# Patient Record
Sex: Male | Born: 1940
Health system: Southern US, Community
[De-identification: ages and names within clinical notes are randomized; demographics above are authoritative.]

## PROBLEM LIST (undated history)

## (undated) DIAGNOSIS — Z8614 Personal history of Methicillin resistant Staphylococcus aureus infection: Secondary | ICD-10-CM

## (undated) DIAGNOSIS — J45909 Unspecified asthma, uncomplicated: Secondary | ICD-10-CM

## (undated) DIAGNOSIS — J309 Allergic rhinitis, unspecified: Secondary | ICD-10-CM

## (undated) DIAGNOSIS — L0390 Cellulitis, unspecified: Secondary | ICD-10-CM

## (undated) DIAGNOSIS — H409 Unspecified glaucoma: Secondary | ICD-10-CM

## (undated) DIAGNOSIS — K5792 Diverticulitis of intestine, part unspecified, without perforation or abscess without bleeding: Secondary | ICD-10-CM

## (undated) DIAGNOSIS — M1990 Unspecified osteoarthritis, unspecified site: Secondary | ICD-10-CM

## (undated) DIAGNOSIS — I5189 Other ill-defined heart diseases: Secondary | ICD-10-CM

## (undated) DIAGNOSIS — B4481 Allergic bronchopulmonary aspergillosis: Secondary | ICD-10-CM

## (undated) DIAGNOSIS — J449 Chronic obstructive pulmonary disease, unspecified: Secondary | ICD-10-CM

## (undated) DIAGNOSIS — R0600 Dyspnea, unspecified: Secondary | ICD-10-CM

## (undated) DIAGNOSIS — Z8619 Personal history of other infectious and parasitic diseases: Secondary | ICD-10-CM

## (undated) DIAGNOSIS — I479 Paroxysmal tachycardia, unspecified: Secondary | ICD-10-CM

## (undated) DIAGNOSIS — R748 Abnormal levels of other serum enzymes: Secondary | ICD-10-CM

## (undated) DIAGNOSIS — I2510 Atherosclerotic heart disease of native coronary artery without angina pectoris: Secondary | ICD-10-CM

## (undated) DIAGNOSIS — I10 Essential (primary) hypertension: Secondary | ICD-10-CM

## (undated) DIAGNOSIS — T148XXA Other injury of unspecified body region, initial encounter: Secondary | ICD-10-CM

## (undated) DIAGNOSIS — E785 Hyperlipidemia, unspecified: Secondary | ICD-10-CM

## (undated) DIAGNOSIS — S29012A Strain of muscle and tendon of back wall of thorax, initial encounter: Secondary | ICD-10-CM

## (undated) DIAGNOSIS — J189 Pneumonia, unspecified organism: Secondary | ICD-10-CM

## (undated) DIAGNOSIS — I82419 Acute embolism and thrombosis of unspecified femoral vein: Secondary | ICD-10-CM

## (undated) DIAGNOSIS — I059 Rheumatic mitral valve disease, unspecified: Secondary | ICD-10-CM

## (undated) DIAGNOSIS — I700 Atherosclerosis of aorta: Secondary | ICD-10-CM

## (undated) DIAGNOSIS — R519 Headache, unspecified: Secondary | ICD-10-CM

## (undated) DIAGNOSIS — I82409 Acute embolism and thrombosis of unspecified deep veins of unspecified lower extremity: Secondary | ICD-10-CM

## (undated) DIAGNOSIS — I493 Ventricular premature depolarization: Secondary | ICD-10-CM

## (undated) DIAGNOSIS — G4700 Insomnia, unspecified: Secondary | ICD-10-CM

## (undated) DIAGNOSIS — K219 Gastro-esophageal reflux disease without esophagitis: Secondary | ICD-10-CM

## (undated) DIAGNOSIS — M129 Arthropathy, unspecified: Secondary | ICD-10-CM

## (undated) HISTORY — DX: Other injury of unspecified body region, initial encounter: T14.8XXA

## (undated) HISTORY — DX: Rheumatic mitral valve disease, unspecified: I05.9

## (undated) HISTORY — DX: Insomnia, unspecified: G47.00

## (undated) HISTORY — DX: Allergic rhinitis, unspecified: J30.9

## (undated) HISTORY — DX: Unspecified osteoarthritis, unspecified site: M19.90

## (undated) HISTORY — DX: Unspecified glaucoma: H40.9

## (undated) HISTORY — PX: APPENDECTOMY: SHX54

## (undated) HISTORY — DX: Unspecified asthma, uncomplicated: J45.909

## (undated) HISTORY — DX: Essential (primary) hypertension: I10

## (undated) HISTORY — DX: Personal history of Methicillin resistant Staphylococcus aureus infection: Z86.14

## (undated) HISTORY — PX: BRONCHOSCOPY: SUR163

## (undated) HISTORY — DX: Strain of muscle and tendon of back wall of thorax, initial encounter: S29.012A

## (undated) HISTORY — DX: Arthropathy, unspecified: M12.9

## (undated) HISTORY — DX: Cellulitis, unspecified: L03.90

## (undated) HISTORY — PX: EYE SURGERY: SHX253

---

## 1995-03-18 DIAGNOSIS — M169 Osteoarthritis of hip, unspecified: Secondary | ICD-10-CM | POA: Insufficient documentation

## 1997-05-16 DIAGNOSIS — M545 Low back pain, unspecified: Secondary | ICD-10-CM | POA: Insufficient documentation

## 1997-05-16 DIAGNOSIS — N400 Enlarged prostate without lower urinary tract symptoms: Secondary | ICD-10-CM | POA: Insufficient documentation

## 2004-07-16 ENCOUNTER — Encounter: Payer: Self-pay | Admitting: Critical Care Medicine

## 2004-07-16 ENCOUNTER — Ambulatory Visit: Payer: Self-pay | Admitting: Family Medicine

## 2004-10-16 ENCOUNTER — Ambulatory Visit: Payer: Self-pay | Admitting: Family Medicine

## 2004-11-09 ENCOUNTER — Ambulatory Visit: Payer: Self-pay

## 2005-01-01 ENCOUNTER — Ambulatory Visit: Payer: Self-pay | Admitting: Family Medicine

## 2005-03-11 ENCOUNTER — Ambulatory Visit: Payer: Self-pay

## 2005-06-05 ENCOUNTER — Ambulatory Visit: Payer: Self-pay | Admitting: Critical Care Medicine

## 2005-06-27 ENCOUNTER — Ambulatory Visit: Payer: Self-pay | Admitting: Cardiology

## 2005-06-27 ENCOUNTER — Ambulatory Visit: Payer: Self-pay | Admitting: Critical Care Medicine

## 2005-07-08 ENCOUNTER — Ambulatory Visit: Payer: Self-pay | Admitting: Critical Care Medicine

## 2005-07-08 DIAGNOSIS — I059 Rheumatic mitral valve disease, unspecified: Secondary | ICD-10-CM | POA: Insufficient documentation

## 2005-09-05 ENCOUNTER — Ambulatory Visit: Payer: Self-pay | Admitting: Critical Care Medicine

## 2005-12-24 ENCOUNTER — Ambulatory Visit: Payer: Self-pay | Admitting: Critical Care Medicine

## 2005-12-31 ENCOUNTER — Ambulatory Visit: Payer: Self-pay | Admitting: Family Medicine

## 2006-01-23 ENCOUNTER — Ambulatory Visit: Payer: Self-pay | Admitting: Critical Care Medicine

## 2006-02-13 ENCOUNTER — Ambulatory Visit: Payer: Self-pay | Admitting: Gastroenterology

## 2006-02-13 LAB — HM COLONOSCOPY

## 2006-08-04 ENCOUNTER — Ambulatory Visit: Payer: Self-pay | Admitting: Internal Medicine

## 2006-12-01 ENCOUNTER — Ambulatory Visit: Payer: Self-pay | Admitting: Critical Care Medicine

## 2006-12-26 ENCOUNTER — Ambulatory Visit: Payer: Self-pay | Admitting: Critical Care Medicine

## 2007-03-24 ENCOUNTER — Ambulatory Visit: Payer: Self-pay | Admitting: Critical Care Medicine

## 2007-05-14 ENCOUNTER — Emergency Department: Payer: Self-pay | Admitting: Emergency Medicine

## 2007-05-22 ENCOUNTER — Emergency Department: Payer: Self-pay | Admitting: Emergency Medicine

## 2007-08-28 DIAGNOSIS — M129 Arthropathy, unspecified: Secondary | ICD-10-CM | POA: Insufficient documentation

## 2007-09-03 ENCOUNTER — Ambulatory Visit: Payer: Self-pay | Admitting: Critical Care Medicine

## 2008-02-09 ENCOUNTER — Ambulatory Visit: Payer: Self-pay | Admitting: Critical Care Medicine

## 2008-02-09 DIAGNOSIS — I10 Essential (primary) hypertension: Secondary | ICD-10-CM | POA: Insufficient documentation

## 2008-07-20 ENCOUNTER — Ambulatory Visit: Payer: Self-pay | Admitting: Critical Care Medicine

## 2008-07-21 DIAGNOSIS — J449 Chronic obstructive pulmonary disease, unspecified: Secondary | ICD-10-CM

## 2008-11-04 ENCOUNTER — Ambulatory Visit: Payer: Self-pay | Admitting: Internal Medicine

## 2008-11-04 DIAGNOSIS — J4489 Other specified chronic obstructive pulmonary disease: Secondary | ICD-10-CM | POA: Insufficient documentation

## 2008-11-04 DIAGNOSIS — J449 Chronic obstructive pulmonary disease, unspecified: Secondary | ICD-10-CM | POA: Insufficient documentation

## 2008-12-16 ENCOUNTER — Telehealth: Payer: Self-pay | Admitting: Critical Care Medicine

## 2008-12-16 ENCOUNTER — Ambulatory Visit: Payer: Self-pay | Admitting: Critical Care Medicine

## 2009-01-17 ENCOUNTER — Ambulatory Visit: Payer: Self-pay | Admitting: Critical Care Medicine

## 2009-01-17 DIAGNOSIS — J309 Allergic rhinitis, unspecified: Secondary | ICD-10-CM | POA: Insufficient documentation

## 2009-03-20 ENCOUNTER — Ambulatory Visit: Payer: Self-pay | Admitting: Critical Care Medicine

## 2009-03-30 ENCOUNTER — Emergency Department: Payer: Self-pay | Admitting: Emergency Medicine

## 2009-07-19 ENCOUNTER — Ambulatory Visit: Payer: Self-pay | Admitting: Critical Care Medicine

## 2009-12-19 ENCOUNTER — Ambulatory Visit: Payer: Self-pay | Admitting: Critical Care Medicine

## 2010-01-10 ENCOUNTER — Encounter: Payer: Self-pay | Admitting: Critical Care Medicine

## 2010-04-24 ENCOUNTER — Ambulatory Visit: Payer: Self-pay | Admitting: Critical Care Medicine

## 2010-07-03 ENCOUNTER — Telehealth: Payer: Self-pay | Admitting: Critical Care Medicine

## 2010-07-03 ENCOUNTER — Ambulatory Visit
Admission: RE | Admit: 2010-07-03 | Discharge: 2010-07-03 | Payer: Self-pay | Source: Home / Self Care | Attending: Critical Care Medicine | Admitting: Critical Care Medicine

## 2010-07-03 DIAGNOSIS — J019 Acute sinusitis, unspecified: Secondary | ICD-10-CM | POA: Insufficient documentation

## 2010-07-19 NOTE — Miscellaneous (Signed)
Summary: Orders Update  Clinical Lists Changes  Orders: Added new Service order of Est. Patient Level III (99213) - Signed 

## 2010-07-19 NOTE — Assessment & Plan Note (Signed)
Summary: congestion//jrc   Visit Type:  Follow-up Primary Provider/Referring Provider:  Dr. Lorie Phenix in [place], Dr Delford Field - Pulmonary  CC:  Pt c/o nasal congestion x 3 weeks. Pt denies cough or chest congestion.  Pt tried sudafed and afrin spray with no relief.  History of Present Illness:  This is a 70 year old man with COPD/ AB  July 03, 2010: ACUTE OV for Dr. Delford Field patient with abovve issues. Reportedly last saw Dr. Delford Field in Nove 2011 for acute sinusitis and treated with antibitoric and prednisone. States prednisone really helped at that time. Currenly has noticed increased sinus congestion past 1 months. Past 1 week sinus drainage has turned from clear to yellow. Today, nose completely blocked up and this is a change. Denies associated fever, wheezing, sputum, headache, toothache, diplopia, ear ache, nausea, vomit, cough, edema, syncope, or chest pain. He feels he will benefit from prednisone and antibiotic again. He is concerned that if left untreatd he could have a AECOPD.      Preventive Screening-Counseling & Management  Alcohol-Tobacco     Smoking Status: quit > 6 months     Year Quit: 1965     Pack years: 4  Current Medications (verified): 1)  Xalatan 0.005 %  Soln (Latanoprost) .... Take As Directed  One Qtt Each Eye Daily 2)  Calcium 600 1500 Mg  Tabs (Calcium Carbonate) .... One By Mouth Two Times A Day 3)  Adult Aspirin Low Strength 81 Mg  Tbdp (Aspirin) .... One By Mouth Once Daily 4)  Therapeutic Multivitamin   Tabs (Multiple Vitamin) .... One  By Mouth Once Daily 5)  Flonase 50 Mcg/act Susp (Fluticasone Propionate) .... Take 2 Sprays in Each Nostril Once A Day As Needed 6)  Alphagan P 0.15 % Soln (Brimonidine Tartrate) .... One Drop Right Eye Two Times A Day 7)  Symbicort 160-4.5 Mcg/act Aero (Budesonide-Formoterol Fumarate) .... Inhale 2 Puffs Two Times A Day 8)  Proventil Hfa 108 (90 Base) Mcg/act Aers (Albuterol Sulfate) .Marland Kitchen.. 1-2 Puffs Every 4-6 Hours  As Needed  Allergies (verified): 1)  ! * Timolol Malete  Past History:  Past medical, surgical, family and social histories (including risk factors) reviewed, and no changes noted (except as noted below).  Past Medical History: Reviewed history from 07/20/2008 and no changes required. Current Problems:  AB  491.20 -- FEV1 104% DLCO 125%  2007 glaucoma Hypertension  Past Surgical History: Reviewed history from 09/03/2007 and no changes required. Appendectomy  Family History: Reviewed history from 09/03/2007 and no changes required. non contrib   Social History: Reviewed history from 04/24/2010 and no changes required. 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  Quit in 1960's.  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  The patient denies shortness of breath with activity, shortness of breath at rest, productive cough, non-productive cough, coughing up blood, chest pain, irregular heartbeats, acid heartburn, indigestion, loss of appetite, weight change, abdominal pain, difficulty swallowing, sore throat, tooth/dental problems, headaches, sneezing, itching, ear ache, anxiety, depression, hand/feet swelling, joint stiffness or pain, rash, change in color of mucus, and fever.    Vital Signs:  Patient profile:   70 year old male Height:      74 inches Weight:      176.25 pounds BMI:     22.71 O2 Sat:      97 % on Room air Temp:     97.3  degrees F oral Pulse rate:   731 / minute BP sitting:   122 / 80  (right arm) Cuff size:   regular  Vitals Entered By: Carron Curie CMA (July 03, 2010 1:33 PM)  O2 Flow:  Room air CC: Pt c/o nasal congestion x 3 weeks. Pt denies cough or chest congestion.  Pt tried sudafed and afrin spray with no relief Comments Medications reviewed with patient Carron Curie CMA  July 03, 2010 1:34 PM Daytime phone number verified with patient.    Physical  Exam  General:  well developed, well nourished, in no acute distress tall  nasal twang + Head:  normocephalic and atraumatic Eyes:  PERRLA/EOM intact; conjunctiva and sclera clear Ears:  TMs intact and clear with normal canals cone of light + Nose:  deviated nasal septum + clear sinus drainage from nostril + Mouth:  no deformity or lesions Neck:  no masses, thyromegaly, or abnormal cervical nodes Chest Wall:  no deformities noted Lungs:  coarse BS throughout.   Heart:  regular rate and rhythm, S1, S2 without murmurs, rubs, gallops, or clicks Abdomen:  bowel sounds positive; abdomen soft and non-tender without masses, or organomegaly Msk:  no deformity or scoliosis noted with normal posture Pulses:  pulses normal Extremities:  no clubbing, cyanosis, edema, or deformity noted Neurologic:  CN II-XII grossly intact with normal reflexes, coordination, muscle strength and tone Skin:  intact without lesions or rashes Cervical Nodes:  no significant adenopathy Axillary Nodes:  no significant adenopathy Psych:  alert and cooperative; normal mood and affect; normal attention span and concentration   Impression & Recommendations:  Problem # 1:  ACUTE SINUSITIS, UNSPECIFIED (ICD-461.9) Assessment New  PLAN take doxycycline after meals as directed take prednisone taper as directed use netti pot saline wash  - use bottled or filtered WARM water after microwaving  - buy over the counter  - my nurse will show a picture of it and explain usage return as previously scheduled with Dr. Delford Field His updated medication list for this problem includes:    Flonase 50 Mcg/act Susp (Fluticasone propionate) .Marland Kitchen... Take 2 sprays in each nostril once a day as needed    Doxycycline Monohydrate 100 Mg Caps (Doxycycline monohydrate) ..... By mouth twice daily after meals  Orders: Est. Patient Level III (63875)  Medications Added to Medication List This Visit: 1)  Doxycycline Monohydrate 100 Mg Caps  (Doxycycline monohydrate) .... By mouth twice daily after meals 2)  Prednisone 10 Mg Tabs (Prednisone) .... 4 tablets daily x2 days, then 3 tablets daily x 2 days, then 2 tablets daily x2 days, then 1 tablet daily x2 days, then stop  Patient Instructions: 1)  take doxycycline after meals as directed 2)  take prednisone taper as directed 3)  use netti pot saline wash 4)   - use bottled or filtered WARM water after microwaving 5)   - buy over the counter 6)   - my nurse will show a picture of it and explain usage 7)  return as previously scheduled with Dr. Delford Field Prescriptions: PREDNISONE 10 MG  TABS (PREDNISONE) 4 tablets daily x2 days, then 3 tablets daily x 2 days, then 2 tablets daily x2 days, then 1 tablet daily x2 days, then stop  #20 x 0   Entered and Authorized by:   Kalman Shan MD   Signed by:   Kalman Shan MD on 07/03/2010   Method used:   Electronically to        [facility]* (retail)  [address]       [place], [place]  [address]       Ph: [phone]       Fax: [phone]   RxID:   [id number] DOXYCYCLINE MONOHYDRATE 100 MG  CAPS (DOXYCYCLINE MONOHYDRATE) By mouth twice daily after meals  #20 x 0   Entered and Authorized by:   Kalman Shan MD   Signed by:   Kalman Shan MD on 07/03/2010   Method used:   Electronically to        [facility]* (retail)       [address]       [place], [place]  [address]       Ph: [phone]       Fax: [phone]   RxID:   [id number]

## 2010-07-19 NOTE — Progress Notes (Signed)
Summary: prescription  Phone Note Call from Patient Call back at Home Phone [phone]   Caller: Patient Call For: wright Summary of Call: Pt states he had an ov on 11/8 and was prescribed something for nasal congestion, says he would like another rx for this med doesn't remember the name of it.//[facility] [place] Initial call taken by: Darletta Moll,  July 03, 2010 8:20 AM  Follow-up for Phone Call        PW pt---Spoke with pt and he is c/i having head congestion, facial pain an pressure, runny nose x 3 weeks.  Pt denies cough or chest congestion.  he states he saw Dr. Delford Field on 04-24-10 and had similar symptoms and was given prednisone and avelox. Pt states he did get better after that course. Appt made to see MR this afternoon at 1:30pm.  Carron Curie CMA  July 03, 2010 11:43 AM  allergies: timolol malate

## 2010-07-19 NOTE — Assessment & Plan Note (Signed)
Summary: Pulmonary OV   Primary Provider/Referring Provider:  Dr. Lorie Phenix in [place]  CC:  4 mo follow up.  states breathing is doing "great."  No complaints. .  History of Present Illness: Pulmonary OV:     This is a 70 year old man with COPD/ AB  July 20, 2008 4:26 PM Dyspnea has been adequate.  No new issues.  No cough.  No chest pain.  Cough went away with ACE inhibitor d/c.  No fever/chills/sweats. Pt denies any significant sore throat, nasal congestion or excess secretions, fever, chills, sweats, unintended weight loss, pleurtic or exertional chest pain, orthopnea PND, or leg swelling Pt denies any increase in rescue therapy over baseline, denies waking up needing it or having any early am or nocturnal exacerbations of coughing/wheezing/or dyspnea.   Acute OV 11/04/2008: Known to have Asthmatic bronchitis. Normal PFTs in 2006. 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 Since then, nasal congestion, cough with white sputum, chest tightnesss. Present all the time. Feels like he is in asthma flare again. Symptoms stable x 2 weeks. Using only qvar which he has upped from once daily to two times a day but not helping. Waking up at night +. Denies fever, orthopnea, paroxysmal nocturnal dyspnea, hemoptysis, colored sputum, edema, chest pain, sick contact, nausea, vomit, diarrhea, weight issues, difficulty walking or memor issuesJuly  2, 2010 9:49 AM Pt seen in followup and doing better.  Less dyspnea.  Had spring pollen asthma flare with acute dyspnea and cough.  Pt rx with prednisone and inhaler med changes. Now no cough.  No wheeze,  no chest pain.  No rescue inhaler use.  Is on Timolol eye drops Pt denies any significant sore throat, nasal congestion or excess secretions, fever, chills, sweats, unintended weight loss, pleurtic or exertional chest pain, orthopnea PND, or leg swelling Pt denies any increase in rescue therapy over baseline, denies waking  up needing it or having any early am or nocturnal exacerbations of coughing/wheezing/or dyspnea.  January 17, 2009 3:38 PM Pt noting more mucous lately with heat.  No wheeze.  Dyspnea is better since stopped timolol. Pt denies any significant sore throat, nasal congestion or excess secretions, fever, chills, sweats, unintended weight loss, pleurtic or exertional chest pain, orthopnea PND, or leg swelling Pt denies any increase in rescue therapy over baseline, denies waking up needing it or having any early am or nocturnal exacerbations of coughing/wheezing/or dyspnea.  Pt is noting more nasal drainage and nasal congestion.  March 20, 2009 11:48 AM no new issues no new problems had flu vaccine one week ago Pt denies any significant sore throat, nasal congestion or excess secretions, fever, chills, sweats, unintended weight loss, pleurtic or exertional chest pain, orthopnea PND, or leg swelling Pt denies any increase in rescue therapy over baseline, denies waking up needing it or having any early am or nocturnal exacerbations of coughing/wheezing/or dyspnea.   July 19, 2009 9:35 AM Doing ok so far.  No wheezes.  No bronchiitis or wheezing.  Pt denies any significant sore throat, nasal congestion or excess secretions, fever, chills, sweats, unintended weight loss, pleurtic or exertional chest pain, orthopnea PND, or leg swelling Pt denies any increase in rescue therapy over baseline, denies waking up needing it or having any early am or nocturnal exacerbations of coughing/wheezing/or dyspnea.   Asthma History    Asthma Control Assessment:    Age range: 12+ years    Symptoms: 0-2  days/week    Nighttime Awakenings: 0-2/month    Interferes w/ normal activity: no limitations    SABA use (not for EIB): 0-2 days/week    ATAQ questionnaire: 0    Exacerbations requiring oral systemic steroids: 0-1/year    Asthma Control Assessment: Well Controlled   Preventive Screening-Counseling &  Management  Alcohol-Tobacco     Smoking Status: quit > 6 months  Current Medications (verified): 1)  Xalatan 0.005 %  Soln (Latanoprost) .... Take As Directed  One Qtt Each Eye Daily 2)  Calcium 600 1500 Mg  Tabs (Calcium Carbonate) .... One By Mouth Two Times A Day 3)  Adult Aspirin Low Strength 81 Mg  Tbdp (Aspirin) .... One By Mouth Once Daily 4)  Therapeutic Multivitamin   Tabs (Multiple Vitamin) .... One  By Mouth Once Daily 5)  Flonase 50 Mcg/act Susp (Fluticasone Propionate) .... Take 2 Sprays in Each Nostril Once A Day As Needed 6)  Symbicort 160-4.5 Mcg/act  Aero (Budesonide-Formoterol Fumarate) .... Two Puffs Twice Daily 7)  Proventil Hfa 108 (90 Base) Mcg/act  Aers (Albuterol Sulfate) .Marland Kitchen.. 1-2 Puffs Every 4-6 Hours As Needed 8)  Alphagan P 0.15 % Soln (Brimonidine Tartrate) .... One Drop Right Eye Two Times A Day  Allergies (verified): 1)  ! * Timolol Malete  Past History:  Past medical, surgical, family and social histories (including risk factors) reviewed, and no changes noted (except as noted below).  Past Medical History: Reviewed history from 07/20/2008 and no changes required. Current Problems:  AB  491.20 -- FEV1 104% DLCO 125%  2007 glaucoma Hypertension  Past Surgical History: Reviewed history from 09/03/2007 and no changes required. Appendectomy  Family History: Reviewed history from 09/03/2007 and no changes required. non contrib   Social History: Reviewed history from 09/03/2007 and no changes required. 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    Vital Signs:  Patient profile:   70 year old male Height:      74 inches Weight:      178.38 pounds BMI:     22.99 O2 Sat:      96 % on Room air Temp:     98.2 degrees F oral Pulse rate:   66 / minute BP sitting:   132 / 86  (left arm)  Vitals Entered By: Gweneth Dimitri RN (July 19, 2009 9:30 AM)  O2 Flow:  Room air CC: 4 mo follow up.  states breathing is doing "great."  No complaints.  Comments Medications reviewed with patient Daytime contact number verified with patient. Gweneth Dimitri RN  July 19, 2009 9:30 AM    Physical Exam  Additional Exam:  Gen: Pleasant, well nourished, in no distress ENT: no lesions, no postnasal drip Neck: No JVD, no TMG, no carotid bruits Lungs: no use of accessory muscles, no dullness to percussion, clear without rales or rhonchi Cardiovascular: RRR, heart sounds normal, no murmurs or gallops, no peripheral edema Musculoskeletal: No deformities, no cyanosis or clubbing     Impression & Recommendations:  Problem # 1:  ASTHMA UNSPECIFIED WITH EXACERBATION (ICD-493.92) Assessment  Improved  Improved asthma control off timolol, mild intermittent  plan cont inhaled meds as prescribed  Complete Medication List: 1)  Xalatan 0.005 % Soln (Latanoprost) .... Take as directed  one qtt each eye daily 2)  Calcium 600 1500 Mg Tabs (Calcium carbonate) .... One by mouth two times a day 3)  Adult Aspirin Low Strength 81 Mg Tbdp (Aspirin) .... One by mouth once daily 4)  Therapeutic Multivitamin Tabs (Multiple vitamin) .... One  by mouth once daily 5)  Flonase 50 Mcg/act Susp (Fluticasone propionate) .... Take 2 sprays in each nostril once a day as needed 6)  Symbicort 160-4.5 Mcg/act Aero (Budesonide-formoterol fumarate) .... Two puffs twice daily 7)  Proventil Hfa 108 (90 Base) Mcg/act Aers (Albuterol sulfate) .Marland Kitchen..  1-2 puffs every 4-6 hours as needed 8)  Alphagan P 0.15 % Soln (Brimonidine tartrate) .... One drop right eye two times a day  Other Orders: Est. Patient Level III (81191)  Patient Instructions: 1)  No change in medications 2)  Return in    4      months Prescriptions: SYMBICORT 160-4.5 MCG/ACT  AERO (BUDESONIDE-FORMOTEROL FUMARATE) Two puffs twice daily  #3 x 4   Entered and Authorized by:   Storm Frisk MD   Signed by:   Storm Frisk MD on 07/19/2009   Method used:   Electronically to        [facility]* (retail)       [address]       [place], [place]  [address]       Ph: [phone]       Fax: [phone]   RxID:   [id number]    Immunization History:  Pneumovax Immunization History:    Pneumovax:  historical (03/17/2009)    Appended Document: Pulmonary OV fax Lorie Phenix

## 2010-07-19 NOTE — Assessment & Plan Note (Signed)
Summary: Pulmonary OV   Primary Provider/Referring Provider:  Dr. Lorie Phenix in [place]  CC:  4 month follow up.  c/o cheat congestion, nasal congestion, and prod cough with white mucus x 4 wks.  Breathing unchanged.Marland Kitchen  History of Present Illness: Pulmonary OV:     This is a 70 year old man with COPD/ AB  December 19, 2009 9:02 AM f/u from 2/11. The pt notes over the last three weeks nasal congestion, pndrip.  No real mucus.  Dyspnea is back to baseline.  Overall pt is now at baseline Pt denies any significant sore throat, nasal congestion or excess secretions, fever, chills, sweats, unintended weight loss, pleurtic or exertional chest pain, orthopnea PND, or leg swelling Pt denies any increase in rescue therapy over baseline, denies waking up needing it or having any early am or nocturnal exacerbations of coughing/wheezing/or dyspnea.   April 24, 2010 10:59 AM This pt c/o cough and mucus for 4 weeks.  He was treated with amoxil per his PCP. He is only partially better.  There is no chest pain. There is no f/c/s. He is not smoking.      Asthma History    Asthma Control Assessment:    Age range: 12+ years    Symptoms: >2 days/week    Nighttime Awakenings: 0-2/month    Interferes w/ normal activity: some limitations    SABA use (not for EIB): >2 days/week    ATAQ questionnaire: 1-2    Exacerbations requiring oral systemic steroids: 0-1/year    Asthma Control Assessment: Not Well Controlled   Current Medications (verified): 1)  Xalatan 0.005 %  Soln (Latanoprost) .... Take As Directed  One Qtt Each Eye Daily 2)  Calcium 600 1500 Mg  Tabs (Calcium Carbonate) .... One By Mouth Two Times A Day 3)  Adult Aspirin Low Strength 81 Mg  Tbdp (Aspirin) .... One By Mouth Once Daily 4)  Therapeutic Multivitamin   Tabs (Multiple Vitamin) .... One  By Mouth Once Daily 5)  Flonase 50 Mcg/act Susp (Fluticasone Propionate) .... Take 2 Sprays in Each Nostril Once A Day As Needed 6)  Alphagan  P 0.15 % Soln (Brimonidine Tartrate) .... One Drop Right Eye Two Times A Day 7)  Symbicort 160-4.5 Mcg/act Aero (Budesonide-Formoterol Fumarate) .... Inhale 2 Puffs Two Times A Day 8)  Proventil Hfa 108 (90 Base) Mcg/act Aers (Albuterol Sulfate) .Marland Kitchen.. 1-2 Puffs Every 4-6 Hours As Needed  Allergies (verified): 1)  ! * Timolol Malete  Past History:  Past medical, surgical, family and social histories (including risk factors) reviewed, and no changes noted (except as noted below).  Past Medical History: Reviewed history from 07/20/2008 and no changes required. Current Problems:  AB  491.20 -- FEV1 104% DLCO 125%  2007 glaucoma Hypertension  Past Surgical History: Reviewed history from 09/03/2007 and no changes required. Appendectomy  Family History: Reviewed history from 09/03/2007 and no changes required. non contrib   Social History: Reviewed history from 09/03/2007 and no changes required. 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  Quit in 1960's.  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  The patient denies coughing up blood, chest pain, irregular heartbeats, acid heartburn, indigestion, loss of appetite, weight change, abdominal pain, difficulty swallowing, sore throat, tooth/dental problems, headaches, nasal congestion/difficulty breathing through nose, sneezing, itching,  ear ache, anxiety, depression, hand/feet swelling, joint stiffness or pain, rash, and fever.    Vital Signs:  Patient profile:   70 year old male Height:      74 inches Weight:      173.25 pounds BMI:     22.32 O2 Sat:      98 % on Room air Temp:     97.5 degrees F oral Pulse rate:   66 / minute BP sitting:   116 / 78  (left arm) Cuff size:   regular  Vitals Entered By: Gweneth Dimitri RN (April 24, 2010 10:21 AM)  O2 Flow:  Room  air CC: 4 month follow up.  c/o cheat congestion, nasal congestion, prod cough with white mucus x 4 wks.  Breathing unchanged. Comments Medications reviewed with patient Daytime contact number verified with patient. Gweneth Dimitri RN  April 24, 2010 10:22 AM    Physical Exam  Additional Exam:  Gen: Pleasant, well nourished, in no distress ENT: no lesions, no postnasal drip Neck: No JVD, no TMG, no carotid bruits Lungs: no use of accessory muscles, no dullness to percussion, exp wheeze, coarse rhonchi , distant BS Cardiovascular: RRR, heart sounds normal, no murmurs or gallops, no peripheral edema Musculoskeletal: No deformities, no cyanosis or clubbing     Impression & Recommendations:  Problem # 1:  BRONCHITIS, OBSTRUCTIVE CHRONIC (ICD-491.20) Assessment Deteriorated asthmatic bronchitis with flare plan avelox x 5days Pulse prednisone cont inhaled meds as prescribed Orders: Est. Patient Level IV (28413)  Medications Added to Medication List This Visit: 1)  Avelox 400 Mg Tabs (Moxifloxacin hcl) .... By mouth daily 2)  Prednisone 10 Mg Tabs (Prednisone) .... Take as directed take 4 daily for two days, then 3 daily for two days, then two daily for two days then one daily for two days then stop  Complete Medication List: 1)  Xalatan 0.005 % Soln (Latanoprost) .... Take as directed  one qtt each eye daily 2)  Calcium 600 1500 Mg Tabs (Calcium carbonate) .... One by mouth two times a day 3)  Adult Aspirin Low Strength 81 Mg Tbdp (Aspirin) .... One by mouth once daily 4)  Therapeutic Multivitamin Tabs (Multiple vitamin) .... One  by mouth once daily 5)  Flonase 50 Mcg/act Susp (Fluticasone propionate) .... Take 2 sprays in each nostril once a day as needed 6)  Alphagan P 0.15 % Soln (Brimonidine tartrate) .... One drop right eye two times a day 7)  Symbicort 160-4.5 Mcg/act Aero (Budesonide-formoterol fumarate) .... Inhale 2 puffs two times a day 8)  Proventil Hfa 108 (90 Base)  Mcg/act Aers (Albuterol sulfate) .Marland Kitchen.. 1-2 puffs every 4-6 hours as needed 9)  Avelox 400 Mg Tabs (Moxifloxacin hcl) .... By mouth daily 10)  Prednisone 10 Mg Tabs (Prednisone) .... Take as directed take 4 daily for two days, then 3 daily for two days, then two daily for two days then one daily for two days then stop  Patient Instructions: 1)  Prednisone 10mg  Take 4 daily for two days, then 3 daily for two days, then two daily for two days then one daily for two days then stop 2)  Avelox one daily for 5 days 3)  All other meds same 4)  Return 4 months unless not improving Prescriptions: PREDNISONE 10 MG  TABS (PREDNISONE) Take as directed Take 4 daily for two days, then 3 daily for two days, then two daily for two days then one daily for two days then stop  #  20 x 0   Entered and Authorized by:   Storm Frisk MD   Signed by:   Storm Frisk MD on 04/24/2010   Method used:   Electronically to        [facility] (retail)       [address]       [place], [place]  [address]       Ph: [phone]       Fax: [phone]   RxID:   [id number] AVELOX 400 MG  TABS (MOXIFLOXACIN HCL) By mouth daily  #5 x 0   Entered and Authorized by:   Storm Frisk MD   Signed by:   Storm Frisk MD on 04/24/2010   Method used:   Electronically to        [facility]* (retail)       [address]       [place], [place]  [address]       Ph: [phone]       Fax: [phone]   RxID:   [id number] SYMBICORT 160-4.5 MCG/ACT AERO (BUDESONIDE-FORMOTEROL FUMARATE) Inhale 2 puffs two times a day  #3 x 4   Entered and Authorized by:   Storm Frisk MD   Signed by:   Storm Frisk MD on 04/24/2010   Method used:   Faxed to ...       [facility] (mail-order)             , [place]         Ph: [phone]       Fax: [phone]   RxID:   [id number]    Immunization History:  Influenza Immunization History:    Influenza:  historical  (02/15/2010)   Prevention & Chronic Care Immunizations   Influenza vaccine: Historical  (02/15/2010)    Tetanus booster: Not documented    Pneumococcal vaccine: Historical  (03/17/2009)    H. zoster vaccine: Not documented  Colorectal Screening   Hemoccult: Not documented    Colonoscopy: Not documented  Other Screening   PSA: Not documented   Smoking status: quit > 6 months  (12/19/2009)  Lipids   Total Cholesterol: Not documented   LDL: Not documented   LDL Direct: Not documented   HDL: Not documented   Triglycerides: Not documented  Hypertension   Last Blood Pressure: 116 / 78  (04/24/2010)   Serum creatinine: Not documented   Serum potassium Not documented  Self-Management Support :    Hypertension self-management support: Not documented  Appended Document: Pulmonary OV fax Lorie Phenix [place]

## 2010-07-19 NOTE — Assessment & Plan Note (Signed)
Summary: Pulmonary OV   Primary Provider/Referring Provider:  Dr. Lorie Phenix in [place]  CC:  4 month follow up.  Pt states breathing is doing " good."  Denies SOB, wheezing, chest tightness, cough.  Pt does states he is "coming off a 3 wk period" of nasal congestion, sneezing, and runny nose.  Marland Kitchen  History of Present Illness: Pulmonary OV:     This is a 70 year old man with COPD/ AB  December 19, 2009 9:02 AM f/u from 2/11. The pt notes over the last three weeks nasal congestion, pndrip.  No real mucus.  Dyspnea is back to baseline.  Overall pt is now at baseline Pt denies any significant sore throat, nasal congestion or excess secretions, fever, chills, sweats, unintended weight loss, pleurtic or exertional chest pain, orthopnea PND, or leg swelling Pt denies any increase in rescue therapy over baseline, denies waking up needing it or having any early am or nocturnal exacerbations of coughing/wheezing/or dyspnea.      Preventive Screening-Counseling & Management  Alcohol-Tobacco     Smoking Status: quit > 6 months     Year Quit: 1965     Pack years: 4  Current Medications (verified): 1)  Xalatan 0.005 %  Soln (Latanoprost) .... Take As Directed  One Qtt Each Eye Daily 2)  Calcium 600 1500 Mg  Tabs (Calcium Carbonate) .... One By Mouth Two Times A Day 3)  Adult Aspirin Low Strength 81 Mg  Tbdp (Aspirin) .... One By Mouth Once Daily 4)  Therapeutic Multivitamin   Tabs (Multiple Vitamin) .... One  By Mouth Once Daily 5)  Flonase 50 Mcg/act Susp (Fluticasone Propionate) .... Take 2 Sprays in Each Nostril Once A Day As Needed 6)  Alphagan P 0.15 % Soln (Brimonidine Tartrate) .... One Drop Right Eye Two Times A Day 7)  Symbicort 160-4.5 Mcg/act Aero (Budesonide-Formoterol Fumarate) .... Inhale 2 Puffs Two Times A Day 8)  Proventil Hfa 108 (90 Base) Mcg/act Aers (Albuterol Sulfate) .Marland Kitchen.. 1-2 Puffs Every 4-6 Hours As Needed  Allergies (verified): 1)  ! * Timolol Malete  Past  History:  Past medical, surgical, family and social histories (including risk factors) reviewed, and no changes noted (except as noted below).  Past Medical History: Reviewed history from 07/20/2008 and no changes required. Current Problems:  AB  491.20 -- FEV1 104% DLCO 125%  2007 glaucoma Hypertension  Past Surgical History: Reviewed history from 09/03/2007 and no changes required. Appendectomy  Family History: Reviewed history from 09/03/2007 and no changes required. non contrib   Social History: Reviewed history from 09/03/2007 and no changes required. 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  retired Art gallery manager  Review of Systems       The patient complains of shortness of breath with activity.  The patient denies shortness of breath at rest, productive cough, non-productive cough, coughing up blood, chest pain, irregular heartbeats, acid heartburn, indigestion, loss of appetite, weight change, abdominal pain, difficulty swallowing, sore throat, tooth/dental problems, headaches, nasal congestion/difficulty breathing through nose, sneezing, itching, ear ache, anxiety, depression, hand/feet swelling, joint stiffness or pain, rash, change in color of mucus, and fever.    Vital Signs:  Patient profile:   70 year old male Height:      74 inches Weight:      172 pounds BMI:     22.16 O2 Sat:      94 % on Room air Temp:     98.2 degrees F oral Pulse rate:   77 / minute BP  sitting:   130 / 90  (left arm) Cuff size:   regular  Vitals Entered By: Gweneth Dimitri RN (December 19, 2009 8:48 AM)  O2 Flow:  Room air CC: 4 month follow up.  Pt states breathing is doing " good."  Denies SOB, wheezing, chest tightness, cough.  Pt does states he is "coming off a 3 wk period" of nasal congestion, sneezing, runny nose.   Comments Medications reviewed with patient Daytime contact number verified with patient. Gweneth Dimitri RN  December 19, 2009 8:50 AM    Physical Exam  Additional Exam:  Gen: Pleasant,  well nourished, in no distress ENT: no lesions, no postnasal drip Neck: No JVD, no TMG, no carotid bruits Lungs: no use of accessory muscles, no dullness to percussion, clear without rales or rhonchi, distant BS Cardiovascular: RRR, heart sounds normal, no murmurs or gallops, no peripheral edema Musculoskeletal: No deformities, no cyanosis or clubbing     Impression & Recommendations:  Problem # 1:  BRONCHITIS, OBSTRUCTIVE CHRONIC (ICD-491.20) Assessment Improved  Stable obstructive bronchitis plan: no change in medications cont daily symbicort  Medications Added to Medication List This Visit: 1)  Symbicort 160-4.5 Mcg/act Aero (Budesonide-formoterol fumarate) .... Inhale 2 puffs two times a day 2)  Proventil Hfa 108 (90 Base) Mcg/act Aers (Albuterol sulfate) .Marland Kitchen.. 1-2 puffs every 4-6 hours as needed  Complete Medication List: 1)  Xalatan 0.005 % Soln (Latanoprost) .... Take as directed  one qtt each eye daily 2)  Calcium 600 1500 Mg Tabs (Calcium carbonate) .... One by mouth two times a day 3)  Adult Aspirin Low Strength 81 Mg Tbdp (Aspirin) .... One by mouth once daily 4)  Therapeutic Multivitamin Tabs (Multiple vitamin) .... One  by mouth once daily 5)  Flonase 50 Mcg/act Susp (Fluticasone propionate) .... Take 2 sprays in each nostril once a day as needed 6)  Alphagan P 0.15 % Soln (Brimonidine tartrate) .... One drop right eye two times a day 7)  Symbicort 160-4.5 Mcg/act Aero (Budesonide-formoterol fumarate) .... Inhale 2 puffs two times a day 8)  Proventil Hfa 108 (90 Base) Mcg/act Aers (Albuterol sulfate) .Marland Kitchen.. 1-2 puffs every 4-6 hours as needed  Patient Instructions: 1)  No change in medications 2)  Return in     4-5     months Prescriptions: SYMBICORT 160-4.5 MCG/ACT AERO (BUDESONIDE-FORMOTEROL FUMARATE) Inhale 2 puffs two times a day  #3 x 4   Entered and Authorized by:   Storm Frisk MD   Signed by:   Storm Frisk MD on 12/19/2009   Method used:   Print then  Give to Patient   RxID:   [id number]   Appended Document: Pulmonary OV fax Lorie Phenix [place] [facility]

## 2010-09-20 ENCOUNTER — Other Ambulatory Visit: Payer: Self-pay | Admitting: Critical Care Medicine

## 2010-10-30 NOTE — Assessment & Plan Note (Signed)
[facility]                             PULMONARY OFFICE NOTE   NAME:Wisner, TREVAN MESSMAN                         MRN:          [id number]  DATE:12/26/2006                            DOB:          May 09, 1941    PULMONARY OFFICE NOTE   Mr. Suddreth is a 70 year old white male with a history of asthmatic  bronchitis, increased dyspnea and wheezing.  During his last visit, we  added Foradil 1 spray b.i.d. and increased Qvar to 80 mcg strength 2  sprays b.i.d.  In the interim, he is less short of breath, having less  wheezing, having decreased cough, and overall improved.   EXAM:  Temperature 98, blood pressure 150/82, pulse 76, saturation 96%  on room air.  CHEST:  Showed to be markedly improved with improved breath sounds,  decreased wheezing.  CARDIAC:  Regular rate and rhythm without S3.  Normal S1, S2.  ABDOMEN:  Soft, nontender.  EXTREMITIES:  No edema or clubbing.  SKIN:  Clear.   IMPRESSION:  Asthmatic bronchitis with improved airway function.   PLAN:  To gradually wean Foradil until off.  Maintain Qvar at 2 sprays  b.i.d. 80 mcg strength, and we will see the patient back in return  follow up in 2 months.     Charlcie Cradle Delford Field, MD, [facility]  Electronically Signed    PEW/MedQ  DD: 12/26/2006  DT: 12/27/2006  Job #: [id number]   cc:   Lorie Phenix

## 2010-10-30 NOTE — Assessment & Plan Note (Signed)
[facility]                             PULMONARY OFFICE NOTE   NAME:[name], [name]                         MRN:          [id number]  DATE:12/01/2006                            DOB:          1941/01/17    [name] is a 70 year old, white male with a recent history of acute  tracheobronchitis in February of this year. Maintains Qvar 40 mcg  strength 2 sprays b.i.d., multivitamins daily, aspirin 81 mg daily,  calcium daily and eyedrops daily. He has had increased cough for 1 month  productive of thick, cloudy, white material. He has noted increased  wheezing, no real fever. His symptoms have progressed to the point where  he is wishing further evaluation of same.   PHYSICAL EXAMINATION:  VITAL SIGNS:  Temperature 97.9, blood pressure  136/88, pulse 72, saturation 95% on room air.  CHEST:  Showed distant breath sounds, a few expired wheezes, poor air  flow.  CARDIAC:  Regular rate and rhythm without S3. Normal S1, S2.  ABDOMEN:  Soft, nontender.  EXTREMITIES:  Showed no edema or clubbing.  SKIN:  Clear.   IMPRESSION:  Chronic asthmatic bronchitic flare. Note is made of  pulmonary function today showing moderate obstructive defect and FEV1 of  62% of predicted, FEC of 84% of predicted.   PLAN:  Increased Qvar to 80 mcg strength at 2 sprays b.i.d. and add  Foradil 1 capsule twice daily. Return the patient for a recheck in 2  weeks.     Charlcie Cradle Delford Field, MD, [facility]  Electronically Signed    PEW/MedQ  DD: 12/01/2006  DT: 12/02/2006  Job #: [id number]

## 2010-10-30 NOTE — Assessment & Plan Note (Signed)
[facility]                             PULMONARY OFFICE NOTE   NAME:Soberanes, DIRON HADDON                         MRN:          [id number]  DATE:03/24/2007                            DOB:          10-05-1940    Mr. Uppal returns in followup.  Has a history of asthmatic bronchitis,  chronic airflow obstruction.  Overall is improved with decreased  shortness of breath, decreased cough.  Maintains QVAR 80 mcg strength  two sprays b.i.d.  He is now on Foradil, maintains just the inhaled  steroid alone.   EXAM:  Temp 97, blood pressure 118/80, pulse 77, saturation 95% in room  air.  CHEST:  Distant breath sounds without evidence of active wheezing, no  rhonchi.  CARDIAC EXAM:  A regular rate and rhythm without S3, normal S1 S2.  ABDOMEN:  Soft, nontender.  EXTREMITIES:  No edema or clubbing.  SKIN:  Clear.  NEUROLOGIC EXAM:  Intact.   IMPRESSION:  Asthmatic bronchitis with stable airflow function.   PLAN:  Reduce QVAR down to 2 sprays b.i.d., 40 mcg strength.  Maintain  this as is and will see the patient back in followup in two months'  time.     Charlcie Cradle Delford Field, MD, [facility]  Electronically Signed    PEW/MedQ  DD: 03/24/2007  DT: 03/24/2007  Job #: [id number]   cc:   Lorie Phenix

## 2010-11-02 NOTE — Assessment & Plan Note (Signed)
[facility]                             PULMONARY OFFICE NOTE   NAME:[name], [name]                         MRN:          [id number]  DATE:08/04/2006                            DOB:          Nov 02, 1940    HISTORY OF PRESENT ILLNESS:  Patient is a 70 year old white male patient  of Dr. Lynelle Doctor who has a known history of rhinitis and chronic  bronchitis that presents for a 2 week history of productive cough ,  nasal congestion, wheezing. Patient denies any fever, hemoptysis,  orthopnea, PND, or leg swelling.   PAST MEDICAL HISTORY:  Reviewed.   CURRENT MEDICATIONS:  Reviewed.   PHYSICAL EXAMINATION:  Patient is a pleasant white male in no acute  distress. He is afebrile with stable vital signs.  HEENT: Nasal mucosa with some mild erythema, nontender sinuses.  Posterior pharynx is clear.  NECK: Supple without adenopathy. No JVD.  Lung sounds reveal coarse breath sounds bilaterally without any wheezing  or crackles.  CARDIAC: Regular rate.  ABDOMEN: Soft and nontender.  EXTREMITIES: Warm without any edema.   PLAN:  Acute tracheobronchitis. Patient is to begin Omnicef x7 days.  Mucinex DM twice a day. Endal HD, #8 ounces, 1 to 2 teaspoons every 4 to  6 hours as needed for cough. Patient is aware of sedating effect.  Patient was given Xopenex nebulizer treatment in office. Patient will  return back with Dr. Delford Field as scheduled or sooner if needed.      Rubye Oaks, NP  Electronically Signed      Charlcie Cradle Delford Field, MD, [facility]  Electronically Signed   TP/MedQ  DD: 08/04/2006  DT: 08/04/2006  Job #: [id number]

## 2010-11-02 NOTE — Assessment & Plan Note (Signed)
[facility]                               PULMONARY OFFICE NOTE   NAME:Amyx, RYDELL WIEGEL                         MRN:          [id number]  DATE:12/24/2005                            DOB:          [birth year]    Mr. Mundis is a 70 year old white male, history of previous asthmatic  bronchitis, now improved.  He has been off Qvar therapy since March of this  year and has not required any rescue inhalers.  He is also weaned off his  Nasonex as well and is on no pulmonary medications whatsoever.  Repeat CT  scan of the chest in January 2007 had shown resolution of the left upper  lobe nodule.   PHYSICAL EXAMINATION:  VITAL SIGNS:  Temperature 97, blood pressure 106/70,  pulse 69, saturation 96% in room air.  CHEST:  Completely clear to auscultation and percussion.  CARDIAC:  Regular rate and rhythm without S3, S1, S2.  ABDOMEN:  Soft, nontender.  EXTREMITIES:  No edema or clubbing.   IMPRESSION:  1.  Resolved bronchitis with improved air flow function, now off all      maintenance therapy.  2.  Resolved pulmonary nodule.   PLAN:  Discontinue further Nasonex and Qvar, and we will see this patient  back on an expectant basis.  Release the patient back to his primary care  physician.  The patient realizes he can return to see [place] at any time if so  needed.                                   Charlcie Cradle Delford Field, MD, FCCP   PEW/MedQ  DD:  12/24/2005  DT:  12/25/2005  Job #:  [id number]   cc:   Lorie Phenix

## 2010-11-02 NOTE — Assessment & Plan Note (Signed)
[facility]                               PULMONARY OFFICE NOTE   NAME:[name], [name]                         MRN:          [id number]  DATE:01/23/2006                            DOB:          Nov 13, 1940    CHIEF COMPLAINT:  Cough and chest congestion.   HISTORY OF PRESENT ILLNESS:  This is a pleasant 70 year old Caucasian male  who presents today with approximately three week history of worsening cough  and nasal congestion.  He reports the  cough is productive of cloudy white  sputum.  He reports significant increase in postnasal drip with chest  rattling and occasional dyspnea.  He reports his dyspnea is somewhat greater  than usual, however, not to the point where it interferes with his ADLs.  He  was recently seen by Dr. Delford Field on December 24, 2005 at which time he had  recently completed therapy for previous asthmatic bronchitis and had been  weaned off all pulmonary medications including Nasonex and Qvar.  He reports  today with return of symptoms as previously described in prior office visit.   MEDICATIONS:  Multivitamin, vitamin D, aspirin, calcium, and vitamin C.   PHYSICAL EXAMINATION:  VITAL SIGNS:  Weight 177 pounds, temperature 98.4,  blood pressure 138/86, pulse rate 86.  Saturation is 94%.  HEENT:  Normocephalic.  Mucous membranes are moist.  The posterior pharynx  is slightly erythremic.  There is no anterior cervical adenopathy.  PULMONARY:  There is occasional expiratory wheeze and occasional scattered  rhonchi which clear with cough.  CARDIAC:  Regular rate and rhythm.  EXTREMITIES:  No edema.   IMPRESSION AND PLAN:  Probable sinusitis and resultant cough and acute  bronchitis.  Suspect [name] has component of allergic rhinitis that is  contributing to this.  Plan for this is to begin a regimen of aggressive  nasal hygiene consisting of Afrin two sprays b.i.d. for five days followed  by Nasonex two sprays b.i.d. for five days on  a perfectly regular basis.  Following five days, he will continue the Nasonex two sprays each nostril on  a daily basis.  Additionally I have asked him to resume his Qvar until he  returns to his baseline respiratory status as well as prescribe him with a Z-  Pak.  [name] was instructed to return to the office on a p.r.n. basis or  sooner should his symptoms return or worsen.                                   Zenia Resides, NP                                Charlcie Cradle. Delford Field, MD, FCCP   PB/MedQ  DD:  01/23/2006  DT:  01/23/2006  Job #:  [id number]

## 2011-02-05 ENCOUNTER — Telehealth: Payer: Self-pay | Admitting: Critical Care Medicine

## 2011-02-05 MED ORDER — AZITHROMYCIN 250 MG PO TABS: ORAL_TABLET | ORAL | Status: AC

## 2011-02-05 NOTE — Telephone Encounter (Signed)
Pt c/o sinus/nasal congestion and drainage x 2 weeks. He says the drainage is a pale yellow and nasal congestion is worse at night. He has used the [employer] once but said this did not help very much. He denies any cough, sob , wheeze or f/c/s. Pls advise. Allergies  Allergen Reactions  . Timolol Maleate (Timoptic) Shortness Of Breath

## 2011-02-05 NOTE — Telephone Encounter (Signed)
Rx was sent to pharm. Pt made aware and will call for ov if not improving.

## 2011-02-05 NOTE — Telephone Encounter (Signed)
Rx Zpak times one OV if unimproving

## 2012-01-23 ENCOUNTER — Other Ambulatory Visit: Payer: Self-pay | Admitting: Critical Care Medicine

## 2012-03-04 ENCOUNTER — Other Ambulatory Visit: Payer: Self-pay | Admitting: Critical Care Medicine

## 2012-03-04 NOTE — Telephone Encounter (Signed)
Pt last seen PW on 04/24/10 Saw MR on 06/2010 No pending appts  Called, spoke with pt.  We have scheduled him to see PW on Mar 25, 2012 at 10:30am in [place].  Symbicort rx sent -- pt aware to keep OV for additional rxs.

## 2012-03-05 ENCOUNTER — Other Ambulatory Visit: Payer: Self-pay | Admitting: Critical Care Medicine

## 2012-03-05 MED ORDER — BUDESONIDE-FORMOTEROL FUMARATE 160-4.5 MCG/ACT IN AERO: 2.0000 | INHALATION_SPRAY | Freq: Two times a day (BID) | RESPIRATORY_TRACT | Status: DC

## 2012-03-05 NOTE — Telephone Encounter (Signed)
Pharmacy requesting symbicort 160/4.5 2 puffs bid #1 with 1 refill.pt has appt 03-25-2012

## 2012-03-24 ENCOUNTER — Encounter: Payer: Self-pay | Admitting: Critical Care Medicine

## 2012-03-25 ENCOUNTER — Ambulatory Visit (INDEPENDENT_AMBULATORY_CARE_PROVIDER_SITE_OTHER): Payer: Self-pay | Admitting: Critical Care Medicine

## 2012-03-25 ENCOUNTER — Encounter: Payer: Self-pay | Admitting: Critical Care Medicine

## 2012-03-25 VITALS — BP 134/86 | HR 80 | Temp 98.0°F | Ht 74.0 in | Wt 175.6 lb

## 2012-03-25 DIAGNOSIS — J45901 Unspecified asthma with (acute) exacerbation: Secondary | ICD-10-CM

## 2012-03-25 MED ORDER — MOMETASONE FUROATE 220 MCG/INH IN AEPB: 2.0000 | INHALATION_SPRAY | Freq: Every day | RESPIRATORY_TRACT | Status: DC

## 2012-03-25 NOTE — Patient Instructions (Addendum)
Stop symbicort Start Asmanex two puff daily Return 4 months with Dr Kendrick Fries in [place]

## 2012-03-25 NOTE — Assessment & Plan Note (Signed)
Moderate persistent asthma with normal spirometry Plan Stop symbicort Start Asmanex two puff daily Return 4 months with Dr Kendrick Fries in [place]

## 2012-03-25 NOTE — Progress Notes (Signed)
Subjective:    Patient ID: [name], male    DOB: 1940/09/23, 71 y.o.   MRN: [id number]  HPI July 03, 2010: ACUTE OV for Dr. Delford Field patient with abovve issues. Reportedly last saw Dr. Delford Field in Nove 2011 for acute sinusitis and treated with antibitoric and prednisone. States prednisone really helped at that time. Currenly has noticed increased sinus congestion past 1 months. Past 1 week sinus drainage has turned from clear to yellow. Today, nose completely blocked up and this is a change. Denies associated fever, wheezing, sputum, headache, toothache, diplopia, ear ache, nausea, vomit, cough, edema, syncope, or chest pain. He feels he will benefit from prednisone and antibiotic again. He is concerned that if left untreatd he could have a AECOPD.   03/25/2012 Not seen since 06/2010.  Did not get recall appt.  Last PW ov 04/2010.  Dyspnea is better. Pt now with neck pain and may get referral. No cough.  No wheezing. Dyspnea is ok.  No flares this year.  No qhs dyspnea issues. Pt denies any significant sore throat, nasal congestion or excess secretions, fever, chills, sweats, unintended weight loss, pleurtic or exertional chest pain, orthopnea PND, or leg swelling Pt denies any increase in rescue therapy over baseline, denies waking up needing it or having any early am or nocturnal exacerbations of coughing/wheezing/or dyspnea. Pt also denies any obvious fluctuation in symptoms with  weather or environmental change or other alleviating or aggravating factors     Review of Systems 11pt Ros taken in detail and neg as noted .     Objective:   Physical Exam  Filed Vitals:   03/25/12 1014  BP: 134/86  Pulse: 80  Temp: 98 F (36.7 C)  TempSrc: Oral  Height: 6\' 2"  (1.88 m)  Weight: 175 lb 9.6 oz (79.652 kg)  SpO2: 94%    Gen: Pleasant, well-nourished, in no distress,  normal affect  ENT: No lesions,  mouth clear,  oropharynx clear, no postnasal drip  Neck: No JVD, no TMG, no carotid  bruits  Lungs: No use of accessory muscles, no dullness to percussion, clear without rales or rhonchi  Cardiovascular: RRR, heart sounds normal, no murmur or gallops, no peripheral edema  Abdomen: soft and NT, no HSM,  BS normal  Musculoskeletal: No deformities, no cyanosis or clubbing  Neuro: alert, non focal  Skin: Warm, no lesions or rashes  No results found.       Assessment & Plan:   Moderate persistent asthma Moderate persistent asthma with normal spirometry Plan Stop symbicort Start Asmanex two puff daily Return 4 months with Dr Kendrick Fries in [facility]    Updated Medication List Outpatient Encounter Prescriptions as of 03/25/2012  Medication Sig Dispense Refill  . albuterol (PROVENTIL HFA) 108 (90 BASE) MCG/ACT inhaler Inhale 1-2 puffs into the lungs every 6 (six) hours as needed.      Marland Kitchen aspirin 81 MG tablet Take 81 mg by mouth daily.      . brimonidine (ALPHAGAN) 0.15 % ophthalmic solution Place 1 drop into both eyes 2 (two) times daily.       . calcium carbonate (OS-CAL) 600 MG TABS Take 600 mg by mouth daily.       . fluticasone (FLONASE) 50 MCG/ACT nasal spray Place 2 sprays into the nose daily as needed.      . latanoprost (XALATAN) 0.005 % ophthalmic solution Place 1 drop into both eyes daily.      . methocarbamol (ROBAXIN) 500 MG tablet Take 1 tablet  by mouth Twice daily.      . Multiple Vitamin (MULTIVITAMIN) tablet Take 1 tablet by mouth daily.      . predniSONE (STERAPRED UNI-PAK) 10 MG tablet As directed      . DISCONTD: budesonide-formoterol (SYMBICORT) 160-4.5 MCG/ACT inhaler Inhale 2 puffs into the lungs 2 (two) times daily.  1 Inhaler  1  . mometasone (ASMANEX 120 METERED DOSES) 220 MCG/INH inhaler Inhale 2 puffs into the lungs daily.  1 Inhaler  12

## 2012-06-03 ENCOUNTER — Ambulatory Visit: Payer: Self-pay | Admitting: Internal Medicine

## 2012-06-29 ENCOUNTER — Telehealth: Payer: Self-pay | Admitting: Critical Care Medicine

## 2012-06-29 NOTE — Telephone Encounter (Signed)
Per the pt, he only wishes to change doctors due to the location of the office. [place] is much closer for him. Will get the pt scheduled for follow-up once PW and BQ are notified. PW and BQ pls advise.

## 2012-06-30 NOTE — Telephone Encounter (Signed)
i am fine with this. This is a very nice patient

## 2012-06-30 NOTE — Telephone Encounter (Signed)
OK by me 

## 2012-06-30 NOTE — Telephone Encounter (Signed)
Pt aware he can be seen in the [place] office with BQ. He says he will call back to get this scheduled after he looks at his calender.

## 2012-07-20 ENCOUNTER — Encounter: Payer: Self-pay | Admitting: Pulmonary Disease

## 2012-07-20 ENCOUNTER — Ambulatory Visit (INDEPENDENT_AMBULATORY_CARE_PROVIDER_SITE_OTHER): Payer: Medicare Other | Admitting: Pulmonary Disease

## 2012-07-20 ENCOUNTER — Ambulatory Visit (INDEPENDENT_AMBULATORY_CARE_PROVIDER_SITE_OTHER)
Admission: RE | Admit: 2012-07-20 | Discharge: 2012-07-20 | Disposition: A | Discharge status: Home / Self Care | Payer: Medicare Other | Source: Ambulatory Visit | Attending: Pulmonary Disease | Admitting: Pulmonary Disease

## 2012-07-20 VITALS — BP 122/72 | HR 67 | Temp 98.2°F | Ht 74.0 in | Wt 172.0 lb

## 2012-07-20 DIAGNOSIS — J45909 Unspecified asthma, uncomplicated: Secondary | ICD-10-CM

## 2012-07-20 DIAGNOSIS — R05 Cough: Secondary | ICD-10-CM

## 2012-07-20 DIAGNOSIS — J309 Allergic rhinitis, unspecified: Secondary | ICD-10-CM

## 2012-07-20 MED ORDER — LEVOFLOXACIN 500 MG PO TABS: 500.0000 mg | ORAL_TABLET | Freq: Every day | ORAL | Status: AC

## 2012-07-20 NOTE — Assessment & Plan Note (Signed)
For now we will focus on saline rinses and over-the-counter antihistamines. In the spring he is to start using his Flonase again. I instructed him on appropriate use.

## 2012-07-20 NOTE — Progress Notes (Signed)
Subjective:    Patient ID: [name], male    DOB: September 28, 1940, 72 y.o.   MRN: [id number]  Synopsis: [name] is a 72 year old male who smoked only in college and was diagnosed with asthma late and adulthood who transitioned his care from the [facility] office under Dr. Delford Field to the [facility] pulmonary office in February 2014. He has simple spirometry consistent with obstruction. He has occasional bronchitis, when doing well no shortness of breath, and a lot of sinus congestion. HPI 07/20/2012 ROV--Islam comes to our clinic today to establish care for asthma. In the last month he's had significant sputum production which is green to yellow. This is associated with some chest soreness and cough. He does not have fever or chills or shortness of breath. He has some sinus congestion associated with this. He says that he produces sputum throughout the day him and it is not worse in the mornings. When he is doing well, he states that he has no shortness of breath or wheezing. He has been taking Asmanex for the last several months and has not had any respiratory complaints up until this episode. He was treated with Augmentin for 10 days earlier in January.  Switched back to  Past Medical History  Diagnosis Date  . Glaucoma   . Hypertension   . AB (asthmatic bronchitis)      Review of Systems  Constitutional: Negative for fever, chills and fatigue.  HENT: Positive for congestion, rhinorrhea and postnasal drip.   Respiratory: Positive for cough and chest tightness. Negative for wheezing.   Cardiovascular: Negative for chest pain, palpitations and leg swelling.       Objective:   Physical Exam  Filed Vitals:   07/20/12 0938  BP: 122/72  Pulse: 67  Temp: 98.2 F (36.8 C)  TempSrc: Oral  Height: 6\' 2"  (1.88 m)  Weight: 172 lb (78.019 kg)  SpO2: 96%   Gen: well appearing, no acute distress HEENT: NCAT, PERRL, EOMi, OP clear, neck supple without masses PULM: CTA B CV: RRR, no mgr, no  JVD AB: BS+, soft, nontender, no hsm Ext: warm, no edema, no clubbing, no cyanosis Derm: no rash or skin breakdown   October 2013 simple spirometry ratio 68%, FEV1 2.77 L (63% predicted) flow volume loop consistent with obstruction January 2007 full pulmonary function testing: Ratio 61%, FEV1 3.47 L (104% predicted), total lung capacity 8.7 L (119% predicted, residual volume 3. 04 L (114% predicted), DLCO 28.6 (125% predicted)      Assessment & Plan:   Moderate persistent asthma Demont is currently suffering from bronchitis which is likely exacerbating some underlying asthma. I question the severity of his asthma given the fact that he says he's never really had any shortness of breath in the past. Clearly he does have obstruction on pulmonary function testing but this may be normal for his age.  Plan: -Chest x-ray today -We will treat for bronchitis with Levaquin (as he has already been treated with Augmentin and still has periodic sputum) -Continue Asmanex -Consider discontinue Asmanex on next visit  ALLERGIC RHINITIS For now we will focus on saline rinses and over-the-counter antihistamines. In the spring he is to start using his Flonase again. I instructed him on appropriate use.    Updated Medication List Outpatient Encounter Prescriptions as of 07/20/2012  Medication Sig Dispense Refill  . aspirin 81 MG tablet Take 81 mg by mouth daily.      . brimonidine (ALPHAGAN) 0.15 % ophthalmic solution Place 1 drop into  both eyes 2 (two) times daily.       . calcium carbonate (OS-CAL) 600 MG TABS Take 600 mg by mouth daily.       Marland Kitchen latanoprost (XALATAN) 0.005 % ophthalmic solution Place 1 drop into both eyes daily.      . mometasone (ASMANEX 120 METERED DOSES) 220 MCG/INH inhaler Inhale 2 puffs into the lungs daily.  1 Inhaler  12  . Multiple Vitamin (MULTIVITAMIN) tablet Take 1 tablet by mouth daily.      Marland Kitchen levofloxacin (LEVAQUIN) 500 MG tablet Take 1 tablet (500 mg total) by mouth  daily.  5 tablet  0  . [DISCONTINUED] albuterol (PROVENTIL HFA) 108 (90 BASE) MCG/ACT inhaler Inhale 1-2 puffs into the lungs every 6 (six) hours as needed.      . [DISCONTINUED] fluticasone (FLONASE) 50 MCG/ACT nasal spray Place 2 sprays into the nose daily as needed.      . [DISCONTINUED] methocarbamol (ROBAXIN) 500 MG tablet Take 1 tablet by mouth Twice daily.      . [DISCONTINUED] predniSONE (STERAPRED UNI-PAK) 10 MG tablet As directed

## 2012-07-20 NOTE — Assessment & Plan Note (Addendum)
[name] is currently suffering from bronchitis which is likely exacerbating some underlying asthma. I question the severity of his asthma given the fact that he says he's never really had any shortness of breath in the past. Clearly he does have obstruction on pulmonary function testing but this may be normal for his age.  Plan: -Chest x-ray today -We will treat for bronchitis with Levaquin (as he has already been treated with Augmentin and still has periodic sputum) -Continue Asmanex -Consider discontinue Asmanex on next visit

## 2012-07-20 NOTE — Patient Instructions (Signed)
We will send you for a chest x-ray at the [facility] office  Take the Levaquin for 5 days, call us if you don't see improvement in your cough/color of your sputum  Use Lloyd [employer]er Med rinses with distilled water at least twice per day using the instructions on the package.  Use chlortrimeton and an over the counter decongestant (pseudophed or phenylephrine) as needed for the cough.  Keep taking the Asthmanex twice a day no matter how you feel  One month from now, start using the Flonase again: 1/2 hour after using the [employer] Med rinse, use Flonase two puffs in each nostril once per day.  We will see you back in 2 months or sooner if needed

## 2012-08-04 ENCOUNTER — Telehealth: Payer: Self-pay | Admitting: Pulmonary Disease

## 2012-08-04 MED ORDER — MOMETASONE FUROATE 220 MCG/INH IN AEPB: 2.0000 | INHALATION_SPRAY | Freq: Every day | RESPIRATORY_TRACT | Status: DC

## 2012-08-04 MED ORDER — FLUTICASONE PROPIONATE 50 MCG/ACT NA SUSP: 2.0000 | Freq: Every day | NASAL | Status: DC

## 2012-08-04 NOTE — Telephone Encounter (Signed)
lmomtcb x1 for pt 

## 2012-08-04 NOTE — Telephone Encounter (Signed)
Spoke with pt to verify the msg His rxs were sent to pharm  Nothing further needed per pt

## 2012-09-29 ENCOUNTER — Encounter: Payer: Self-pay | Admitting: Pulmonary Disease

## 2012-09-29 ENCOUNTER — Ambulatory Visit (INDEPENDENT_AMBULATORY_CARE_PROVIDER_SITE_OTHER): Payer: Medicare Other | Admitting: Pulmonary Disease

## 2012-09-29 VITALS — BP 104/76 | HR 81 | Temp 97.7°F | Ht 74.0 in | Wt 175.1 lb

## 2012-09-29 DIAGNOSIS — J45909 Unspecified asthma, uncomplicated: Secondary | ICD-10-CM

## 2012-09-29 DIAGNOSIS — J309 Allergic rhinitis, unspecified: Secondary | ICD-10-CM

## 2012-09-29 NOTE — Assessment & Plan Note (Signed)
Very minimal symptoms, very stable interval.  Plan: -if doing OK on anti-histamine, then decrease Asmanex dose to daily for one week, then off -if symptoms recur, then maintain Asmanex at dose

## 2012-09-29 NOTE — Patient Instructions (Addendum)
Use Zyrtec, Allegra, or Claritin daily (or their generic forms which are fine) Use saline spray at least twice day at least 30 minutes before your flonase If your symptoms improve with the antihistamine (drugs listed above), then decrease the Asmanex to nightly for a week, then stop it if you are doing well  We will see you back in 4-6 months or sooner if needed

## 2012-09-29 NOTE — Progress Notes (Signed)
Subjective:    Patient ID: [name], male    DOB: Jan 17, 1941, 72 y.o.   MRN: [id number]  Synopsis: [name] is a 72 year old male who smoked only in college and was diagnosed with asthma late and adulthood who transitioned his care from the [facility] office under Dr. Delford Field to the [facility] pulmonary office in February 2014. He has simple spirometry consistent with obstruction. He has occasional bronchitis, when doing well no shortness of breath, and a lot of sinus congestion. HPI  07/20/2012 ROV--Eryc comes to our clinic today to establish care for asthma. In the last month he's had significant sputum production which is green to yellow. This is associated with some chest soreness and cough. He does not have fever or chills or shortness of breath. He has some sinus congestion associated with this. He says that he produces sputum throughout the day him and it is not worse in the mornings. When he is doing well, he states that he has no shortness of breath or wheezing. He has been taking Asmanex for the last several months and has not had any respiratory complaints up until this episode. He was treated with Augmentin for 10 days earlier in January.  09/29/12 ROV -- [name] said that after her last visit his cough and sputum production improved with the Levaquin. He had no respiratory symptoms at all until about a week ago. At that time he started to develop sinus congestion, itchy eyes, and a runny nose. He has been worse in the evenings. This is been going on for 7-10 days. He is not taking over-the-counter antihistamine but he has been using Sudafed. He continues to use the Asmanex.  Switched back to  Past Medical History  Diagnosis Date  . Glaucoma   . Hypertension   . AB (asthmatic bronchitis)      Review of Systems  Constitutional: Negative for fever, chills and fatigue.  HENT: Positive for congestion, rhinorrhea and postnasal drip.   Respiratory: Positive for cough and chest  tightness. Negative for wheezing.   Cardiovascular: Negative for chest pain, palpitations and leg swelling.       Objective:   Physical Exam   Filed Vitals:   09/29/12 0922  BP: 104/76  Pulse: 81  Temp: 97.7 F (36.5 C)  TempSrc: Oral  Height: 6\' 2"  (1.88 m)  Weight: 175 lb 1.9 oz (79.434 kg)  SpO2: 94%   Room air  Gen: well appearing, no acute distress HEENT: NCAT, EOMi, OP clear,  PULM: CTA B CV: RRR, no mgr, no JVD AB: BS+, nontender, no hsm Ext: warm, no edema, no clubbing, no cyanosis Derm: no rash or skin breakdown   October 2013 simple spirometry ratio 68%, FEV1 2.77 L (63% predicted) flow volume loop consistent with obstruction January 2007 full pulmonary function testing: Ratio 61%, FEV1 3.47 L (104% predicted), total lung capacity 8.7 L (119% predicted, residual volume 3. 04 L (114% predicted), DLCO 28.6 (125% predicted)      Assessment & Plan:   ALLERGIC RHINITIS [name] symptoms have started to flare in the last two weeks with all the pollen out.  Plan: -add daily Zyrtec, Allegra, or Claritin -add saline sprays -continue flonase  Moderate persistent asthma Very minimal symptoms, very stable interval.  Plan: -if doing OK on anti-histamine, then decrease Asmanex dose to daily for one week, then off -if symptoms recur, then maintain Asmanex at dose    Updated Medication List Outpatient Encounter Prescriptions as of 09/29/2012  Medication Sig  Dispense Refill  . aspirin 81 MG tablet Take 81 mg by mouth daily.      . brimonidine (ALPHAGAN) 0.15 % ophthalmic solution Place 1 drop into both eyes 2 (two) times daily.       . calcium carbonate (OS-CAL) 600 MG TABS Take 600 mg by mouth daily.       . fluticasone (FLONASE) 50 MCG/ACT nasal spray Place 2 sprays into the nose daily.  16 g  3  . latanoprost (XALATAN) 0.005 % ophthalmic solution Place 1 drop into both eyes daily.      . mometasone (ASMANEX 120 METERED DOSES) 220 MCG/INH inhaler Inhale 2  puffs into the lungs daily.  1 Inhaler  3  . Multiple Vitamin (MULTIVITAMIN) tablet Take 1 tablet by mouth daily.       No facility-administered encounter medications on file as of 09/29/2012.

## 2012-09-29 NOTE — Assessment & Plan Note (Signed)
Mr. Colao symptoms have started to flare in the last two weeks with all the pollen out.  Plan: -add daily Zyrtec, Allegra, or Claritin -add saline sprays -continue flonase

## 2012-10-19 ENCOUNTER — Other Ambulatory Visit: Payer: Self-pay | Admitting: Pulmonary Disease

## 2012-10-19 ENCOUNTER — Encounter: Payer: Self-pay | Admitting: Pulmonary Disease

## 2012-10-19 ENCOUNTER — Ambulatory Visit (INDEPENDENT_AMBULATORY_CARE_PROVIDER_SITE_OTHER)
Admission: RE | Admit: 2012-10-19 | Discharge: 2012-10-19 | Disposition: A | Discharge status: Home / Self Care | Payer: Medicare Other | Source: Ambulatory Visit | Attending: Pulmonary Disease | Admitting: Pulmonary Disease

## 2012-10-19 ENCOUNTER — Ambulatory Visit (INDEPENDENT_AMBULATORY_CARE_PROVIDER_SITE_OTHER): Payer: Medicare Other | Admitting: Pulmonary Disease

## 2012-10-19 VITALS — BP 130/90 | HR 93 | Temp 97.0°F | Ht 74.0 in | Wt 176.8 lb

## 2012-10-19 DIAGNOSIS — J45909 Unspecified asthma, uncomplicated: Secondary | ICD-10-CM

## 2012-10-19 DIAGNOSIS — R05 Cough: Secondary | ICD-10-CM

## 2012-10-19 DIAGNOSIS — R0602 Shortness of breath: Secondary | ICD-10-CM

## 2012-10-19 DIAGNOSIS — R059 Cough, unspecified: Secondary | ICD-10-CM | POA: Insufficient documentation

## 2012-10-19 DIAGNOSIS — R599 Enlarged lymph nodes, unspecified: Secondary | ICD-10-CM

## 2012-10-19 MED ORDER — HYDROCOD POLST-CHLORPHEN POLST 10-8 MG/5ML PO LQCR: 5.0000 mL | Freq: Two times a day (BID) | ORAL | Status: DC | PRN

## 2012-10-19 MED ORDER — PREDNISONE 10 MG PO TABS: ORAL_TABLET | ORAL | Status: DC

## 2012-10-19 NOTE — Assessment & Plan Note (Signed)
This is likely due to laryngeal irritation/inflammation and post nasal drip.  Plan: -resume OTC anti-histamine, neil med rinses and flonase -prn tussionex -voice rest for 3 days -delsym prn after three days of tussionex

## 2012-10-19 NOTE — Progress Notes (Signed)
Quick Note:  Spoke with patient, made him aware of results and recs as listed below per BQ. Patient verbalized understanding of this and nothing further needed at this time. ______

## 2012-10-19 NOTE — Progress Notes (Signed)
Subjective:    Patient ID: [name], male    DOB: May 14, 1941, 72 y.o.   MRN: [id number]  Synopsis: [name] is a 72 year old male who smoked only in college and was diagnosed with asthma late and adulthood who transitioned his care from the [facility] office under Dr. Delford Field to the [facility] pulmonary office in February 2014. He has simple spirometry consistent with obstruction. He has occasional bronchitis, when doing well no shortness of breath, and a lot of sinus congestion. HPI  07/20/2012 ROV--[name] comes to our clinic today to establish care for asthma. In the last month he's had significant sputum production which is green to yellow. This is associated with some chest soreness and cough. He does not have fever or chills or shortness of breath. He has some sinus congestion associated with this. He says that he produces sputum throughout the day him and it is not worse in the mornings. When he is doing well, he states that he has no shortness of breath or wheezing. He has been taking Asmanex for the last several months and has not had any respiratory complaints up until this episode. He was treated with Augmentin for 10 days earlier in January.  09/29/12 ROV -- [name] said that after her last visit his cough and sputum production improved with the Levaquin. He had no respiratory symptoms at all until about a week ago. At that time he started to develop sinus congestion, itchy eyes, and a runny nose. He has been worse in the evenings. This is been going on for 7-10 days. He is not taking over-the-counter antihistamine but he has been using Sudafed. He continues to use the Asmanex.  10/19/12 ROV -- [name] states that he has had 2 weeks of coughing, inflammation in wind pipe, wheezing, and even snoring some which is unusual for him.  He has not had a fever or chills.  He says that his head feels full.  He still has some sinus drainage.  He used generic claritin, Lloyd Huger Med rinses, and flonase  before and says that it helped with his sinus congestion.  He has some slight dyspnea on exertion.   Switched back to  Past Medical History  Diagnosis Date  . Glaucoma   . Hypertension   . AB (asthmatic bronchitis)      Review of Systems  Constitutional: Negative for fever, chills and fatigue.  HENT: Positive for congestion, rhinorrhea and postnasal drip.   Respiratory: Positive for cough and chest tightness. Negative for wheezing.   Cardiovascular: Negative for chest pain, palpitations and leg swelling.       Objective:   Physical Exam   Filed Vitals:   10/19/12 1027  BP: 130/90  Pulse: 93  Temp: 97 F (36.1 C)  TempSrc: Oral  Height: 6\' 2"  (1.88 m)  Weight: 176 lb 12.8 oz (80.196 kg)  SpO2: 95%   Room air  Gen: well appearing, no acute distress HEENT: NCAT, EOMi, OP clear,  PULM: Very slight expiratory wheezing CV: RRR, no mgr, no JVD AB: BS+, nontender, no hsm Ext: warm, no edema, no clubbing, no cyanosis Derm: no rash or skin breakdown   October 2013 simple spirometry ratio 68%, FEV1 2.77 L (63% predicted) flow volume loop consistent with obstruction January 2007 full pulmonary function testing: Ratio 61%, FEV1 3.47 L (104% predicted), total lung capacity 8.7 L (119% predicted, residual volume 3. 04 L (114% predicted), DLCO 28.6 (125% predicted)      Assessment & Plan:  Moderate persistent asthma His current wheezing, cough and dyspnea are consistent with a mild flare of asthma set off either by all the pollen out or a URI.  He does not have purulent sputum production to suggest that he needs antibiotics.  Plan: -prednisone taper -Asmanex bid until next visit -CXR today   Cough This is likely due to laryngeal irritation/inflammation and post nasal drip.  Plan: -resume OTC anti-histamine, neil med rinses and flonase -prn tussionex -voice rest for 3 days -delsym prn after three days of tussionex    Updated Medication List Outpatient  Encounter Prescriptions as of 10/19/2012  Medication Sig Dispense Refill  . aspirin 81 MG tablet Take 81 mg by mouth daily.      . brimonidine (ALPHAGAN) 0.15 % ophthalmic solution Place 1 drop into both eyes 2 (two) times daily.       . calcium carbonate (OS-CAL) 600 MG TABS Take 600 mg by mouth daily.       . fluticasone (FLONASE) 50 MCG/ACT nasal spray Place 2 sprays into the nose daily.  16 g  3  . latanoprost (XALATAN) 0.005 % ophthalmic solution Place 1 drop into both eyes daily.      Marland Kitchen loratadine (CLARITIN) 10 MG tablet Take 10 mg by mouth daily.      . mometasone (ASMANEX 120 METERED DOSES) 220 MCG/INH inhaler Inhale 2 puffs into the lungs daily.  1 Inhaler  3  . Multiple Vitamin (MULTIVITAMIN) tablet Take 1 tablet by mouth daily.       No facility-administered encounter medications on file as of 10/19/2012.

## 2012-10-19 NOTE — Patient Instructions (Addendum)
Take the prednisone as written for 6 days We will call you with the Chest X-ray results  If you are not better in 2-3 days or if you develop fever or yellow sputum then call us  Resume your claritin, flonase and nasal sprays  Use the tussionex to suppress your cough for three days.  Do not take it and drive afterwards.  Try to suppress your cough for three days.  Use hard candies or warm tea to moisten your throat.  After three days, use Delsym instead of Tussionex for the cough.  We will see you back in 4-6 weeks or sooner if needed

## 2012-10-19 NOTE — Assessment & Plan Note (Signed)
His current wheezing, cough and dyspnea are consistent with a mild flare of asthma set off either by all the pollen out or a URI.  He does not have purulent sputum production to suggest that he needs antibiotics.  Plan: -prednisone taper -Asmanex bid until next visit -CXR today

## 2012-10-21 ENCOUNTER — Ambulatory Visit (INDEPENDENT_AMBULATORY_CARE_PROVIDER_SITE_OTHER)
Admission: RE | Admit: 2012-10-21 | Discharge: 2012-10-21 | Disposition: A | Discharge status: Home / Self Care | Payer: Medicare Other | Source: Ambulatory Visit | Attending: Pulmonary Disease | Admitting: Pulmonary Disease

## 2012-10-21 DIAGNOSIS — R599 Enlarged lymph nodes, unspecified: Secondary | ICD-10-CM

## 2012-10-23 ENCOUNTER — Encounter: Payer: Self-pay | Admitting: Pulmonary Disease

## 2012-10-23 NOTE — Progress Notes (Signed)
Quick Note:  Spoke with patient, informed him of results as listed below per BQ. Pt verbalized understanding and nothing further needed at this time ______

## 2012-11-17 ENCOUNTER — Ambulatory Visit: Payer: Medicare Other | Admitting: Pulmonary Disease

## 2013-03-30 ENCOUNTER — Encounter: Payer: Self-pay | Admitting: Pulmonary Disease

## 2013-03-30 ENCOUNTER — Ambulatory Visit (INDEPENDENT_AMBULATORY_CARE_PROVIDER_SITE_OTHER): Payer: Medicare Other | Admitting: Pulmonary Disease

## 2013-03-30 ENCOUNTER — Encounter (INDEPENDENT_AMBULATORY_CARE_PROVIDER_SITE_OTHER): Payer: Self-pay

## 2013-03-30 VITALS — BP 110/70 | HR 70 | Temp 97.7°F | Ht 74.0 in | Wt 174.0 lb

## 2013-03-30 DIAGNOSIS — J309 Allergic rhinitis, unspecified: Secondary | ICD-10-CM

## 2013-03-30 DIAGNOSIS — J329 Chronic sinusitis, unspecified: Secondary | ICD-10-CM

## 2013-03-30 DIAGNOSIS — J45909 Unspecified asthma, uncomplicated: Secondary | ICD-10-CM

## 2013-03-30 NOTE — Progress Notes (Signed)
Subjective:    Patient ID: [name], male    DOB: Nov 13, 1940, 72 y.o.   MRN: [id number]  Synopsis: [name] is a 72 year old male who smoked only in college and was diagnosed with asthma late and adulthood who transitioned his care from the [facility] office under Dr. Delford Field to the [facility] pulmonary office in February 2014. He has simple spirometry consistent with obstruction. He has occasional bronchitis, when doing well no shortness of breath, and a lot of sinus congestion. HPI  07/20/2012 ROV--[name] comes to our clinic today to establish care for asthma. In the last month he's had significant sputum production which is green to yellow. This is associated with some chest soreness and cough. He does not have fever or chills or shortness of breath. He has some sinus congestion associated with this. He says that he produces sputum throughout the day him and it is not worse in the mornings. When he is doing well, he states that he has no shortness of breath or wheezing. He has been taking Asmanex for the last several months and has not had any respiratory complaints up until this episode. He was treated with Augmentin for 10 days earlier in January.  09/29/12 ROV -- [name] said that after her last visit his cough and sputum production improved with the Levaquin. He had no respiratory symptoms at all until about a week ago. At that time he started to develop sinus congestion, itchy eyes, and a runny nose. He has been worse in the evenings. This is been going on for 7-10 days. He is not taking over-the-counter antihistamine but he has been using Sudafed. He continues to use the Asmanex.  10/19/12 ROV -- [name] states that he has had 2 weeks of coughing, inflammation in wind pipe, wheezing, and even snoring some which is unusual for him.  He has not had a fever or chills.  He says that his head feels full.  He still has some sinus drainage.  He used generic claritin, Lloyd Huger Med rinses, and flonase  before and says that it helped with his sinus congestion.  He has some slight dyspnea on exertion.  03/30/2013 ROV > [name] says that he has kept chest congestion since th last visit.  It has been causing hoarseness and won't go a way.  He has persistent nasal congestion in the left side more than the right.  He had a "submucosal resection" in 1970's.  He feels that the congestion is from both nose and lungs. It is usually white to yellow in color.  He feels that his breathing is OK, no dyspnea, no wheezing.  No rescue inhaler use lately.   Switched back to  Past Medical History  Diagnosis Date  . Glaucoma   . Hypertension   . AB (asthmatic bronchitis)      Review of Systems  Constitutional: Negative for fever, chills and fatigue.  HENT: Positive for congestion, postnasal drip, rhinorrhea and voice change.   Respiratory: Negative for cough, chest tightness and wheezing.   Cardiovascular: Negative for chest pain, palpitations and leg swelling.       Objective:   Physical Exam   Filed Vitals:   03/30/13 0935  BP: 110/70  Pulse: 70  Temp: 97.7 F (36.5 C)  TempSrc: Oral  Height: 6\' 2"  (1.88 m)  Weight: 174 lb (78.926 kg)  SpO2: 94%   Room air  Gen: well appearing, no acute distress HEENT: NCAT, EOMi, OP clear,  PULM: Good air  movement CV: RRR, no mgr, no JVD AB: BS+, nontender, no hsm Ext: warm, no edema, no clubbing, no cyanosis Derm: no rash or skin breakdown   October 2013 simple spirometry ratio 68%, FEV1 2.77 L (63% predicted) flow volume loop consistent with obstruction January 2007 full pulmonary function testing: Ratio 61%, FEV1 3.47 L (104% predicted), total lung capacity 8.7 L (119% predicted, residual volume 3. 04 L (114% predicted), DLCO 28.6 (125% predicted) 10/2012 CXR with ? Hilar mass 10/2012 CT chest >> normal, no mass; air cyst left lung      Assessment & Plan:   ALLERGIC RHINITIS This continues to be a problem for Chosen and is the primary  reason for all his mucus production.  He has had sinus surgery in the past and is unable to adequately rinse his sinuses.  He needs to start using zyrtec regularly again.  Because of his persistent symptoms I am going to refer him to [facility] ENT.  Moderate persistent asthma This has been stable lately.  His mucus production is primarily due to his allergic rhinitis rather than the asthma.    Plan: -flu shot is UTD -decrease Asmanex to 1 puff bid    Updated Medication List Outpatient Encounter Prescriptions as of 03/30/2013  Medication Sig Dispense Refill  . aspirin 81 MG tablet Take 81 mg by mouth daily.      . brimonidine (ALPHAGAN) 0.15 % ophthalmic solution Place 1 drop into both eyes 2 (two) times daily.       . calcium carbonate (OS-CAL) 600 MG TABS Take 600 mg by mouth daily.       . fluticasone (FLONASE) 50 MCG/ACT nasal spray Place 2 sprays into the nose daily.  16 g  3  . latanoprost (XALATAN) 0.005 % ophthalmic solution Place 1 drop into both eyes daily.      . mometasone (ASMANEX 120 METERED DOSES) 220 MCG/INH inhaler Inhale 2 puffs into the lungs daily.  1 Inhaler  3  . Multiple Vitamin (MULTIVITAMIN) tablet Take 1 tablet by mouth daily.      . [DISCONTINUED] chlorpheniramine-HYDROcodone (TUSSIONEX PENNKINETIC ER) 10-8 MG/5ML LQCR Take 5 mLs by mouth every 12 (twelve) hours as needed (cough).  140 mL  0  . [DISCONTINUED] loratadine (CLARITIN) 10 MG tablet Take 10 mg by mouth daily.      . [DISCONTINUED] predniSONE (DELTASONE) 10 MG tablet 20mg  daily for 3 days, then 10mg  daily for 3 days, then off  20 tablet  0   No facility-administered encounter medications on file as of 03/30/2013.

## 2013-03-30 NOTE — Assessment & Plan Note (Addendum)
This continues to be a problem for Stryker and is the primary reason for all his mucus production.  He has had sinus surgery in the past and is unable to adequately rinse his sinuses.  He needs to start using zyrtec regularly again.  Because of his persistent symptoms I am going to refer him to [facility] ENT.

## 2013-03-30 NOTE — Assessment & Plan Note (Signed)
This has been stable lately.  His mucus production is primarily due to his allergic rhinitis rather than the asthma.    Plan: -flu shot is UTD -decrease Asmanex to 1 puff bid

## 2013-03-30 NOTE — Patient Instructions (Signed)
Start taking generic zyrtec at night Keep using your saline rinses Once your mucus production declines, you can decrease your Asmanex to 1 puff twice a day, watch for increased wheezing and shortness of breath We will refer you to [facility] ENT  We will see you back in 4-6 months or sooner if needed

## 2013-04-07 ENCOUNTER — Telehealth: Payer: Self-pay | Admitting: Pulmonary Disease

## 2013-04-07 MED ORDER — FLUTICASONE PROPIONATE 50 MCG/ACT NA SUSP: 2.0000 | Freq: Every day | NASAL | Status: DC

## 2013-04-07 NOTE — Telephone Encounter (Signed)
Pt aware RX has been sent. Nothing further needed 

## 2013-06-15 ENCOUNTER — Ambulatory Visit: Payer: Self-pay | Admitting: Otolaryngology

## 2013-06-15 ENCOUNTER — Ambulatory Visit: Payer: Self-pay

## 2013-06-21 ENCOUNTER — Encounter: Payer: Self-pay | Admitting: *Deleted

## 2013-06-23 ENCOUNTER — Encounter: Payer: Self-pay | Admitting: Cardiology

## 2013-06-23 ENCOUNTER — Ambulatory Visit (INDEPENDENT_AMBULATORY_CARE_PROVIDER_SITE_OTHER): Payer: Medicare Other | Admitting: Cardiology

## 2013-06-23 ENCOUNTER — Encounter (INDEPENDENT_AMBULATORY_CARE_PROVIDER_SITE_OTHER): Payer: Self-pay

## 2013-06-23 VITALS — BP 148/93 | HR 84 | Ht 74.0 in | Wt 170.5 lb

## 2013-06-23 DIAGNOSIS — I4949 Other premature depolarization: Secondary | ICD-10-CM

## 2013-06-23 DIAGNOSIS — I479 Paroxysmal tachycardia, unspecified: Secondary | ICD-10-CM | POA: Insufficient documentation

## 2013-06-23 DIAGNOSIS — R000 Tachycardia, unspecified: Secondary | ICD-10-CM

## 2013-06-23 DIAGNOSIS — I493 Ventricular premature depolarization: Secondary | ICD-10-CM | POA: Insufficient documentation

## 2013-06-23 DIAGNOSIS — I10 Essential (primary) hypertension: Secondary | ICD-10-CM

## 2013-06-23 NOTE — Assessment & Plan Note (Signed)
A single episode of what looked to be sinus tachycardia, that was asymptomatic. For now we'll simply monitor unless symptoms were truly present. Would avoid beta blockers do to his asthma. At any rate control would be recommended, I would use a non-dihydropyridine calcium channel blockers such as verapamil or diltiazem.

## 2013-06-23 NOTE — Assessment & Plan Note (Signed)
He is noted to be a palpate PVCs for several years now. They're asymptomatic, and relatively benign. He is not having any untoward cardiac symptoms that would suggest the need for further evaluation. At this point, I would simply monitor.Marland Kitchen

## 2013-06-23 NOTE — Progress Notes (Signed)
PATIENT: [name] MRN: [id number] DOB: 12-01-1940 PCP: Margarita Rana, MD  Clinic Note: Chief Complaint  Patient presents with  . other    Ref by Dr. Richardson Landry due to abn EKG c/o rapid heart rate. Meds reviewed verbally with pt.    HPI: [name] is a 73 y.o. male with a PMH below who presents today for evaluation of postop tachycardia. He has no active cardiac disease, with only hypertension as a risk factor other than age and male gender.  Interval History: At baseline, he denies any chest discomfort/pressure or dyspnea with rest or exertion. Prior to his nasal surgery he did have some difficulty with breathing it was not short of breath. He denied any PND, orthopnea or edema. No lightheaded, dizziness, weakness or syncope/near-syncope. No rapid or irregular heartbeats. He denies any TIA or amaurosis fugax symptoms. No melena, hematochezia hematuria.  He recently underwent sinus surgery on December 30 at the [facility]. All well below the surgery, however postoperatively he was noted to have a tachycardia on telemetry with a rate of about 130 beats a minute that was regular. An ECG was taken at the time that showed a heart rate of 124 beats per minute. No clear P waves were seen, and the computer read it as accelerated junctional rhythm with retrograde conduction. He was given some type of medication IV and everything went back to normal. He has not had any more episodes that he is aware of. He was totally asymptomatic when this occurred, there was no suggestion of hypotension. He says that he only notes an irregular heartbeat on his feeling his pulse after exercising at happens every so often. He is not symptomatic with it it doesn't bother him. He says that he has known about this for roughly 20 years now.   Past Medical History  Diagnosis Date  . Glaucoma   . Hypertension   . AB (asthmatic bronchitis)   . Basal cell cancer   . Cellulitis   . Hx MRSA infection   .  Insomnia   . Upper back strain   . Foreign body in skin   . Osteoarthrosis   . Mitral valve disorder   . Arthropathy   . Asthma   . Allergic rhinitis     Prior Cardiac Evaluation and Past Surgical History: Past Surgical History  Procedure Laterality Date  . Appendectomy    . Nasal sinus surgery      Allergies  Allergen Reactions  . Timolol Maleate [Timolol Maleate] Shortness Of Breath  . Lisinopril     cough    Current Outpatient Prescriptions  Medication Sig Dispense Refill  . aspirin 81 MG tablet Take 81 mg by mouth daily.      . brimonidine (ALPHAGAN) 0.15 % ophthalmic solution Place 1 drop into both eyes 2 (two) times daily.       . calcium carbonate (OS-CAL) 600 MG TABS Take 600 mg by mouth daily.       . fluticasone (FLONASE) 50 MCG/ACT nasal spray Place 2 sprays into the nose daily.  16 g  3  . latanoprost (XALATAN) 0.005 % ophthalmic solution Place 1 drop into both eyes daily.      . mometasone (ASMANEX 120 METERED DOSES) 220 MCG/INH inhaler Inhale 2 puffs into the lungs daily.  1 Inhaler  3  . Multiple Vitamin (MULTIVITAMIN) tablet Take 1 tablet by mouth daily.       No current facility-administered medications for this visit.  History   Social History Narrative   He is a married father of 2 daughters.   He smoked casually while in college for about 3 years less than a pack a day.   He drinks 3-4 glass of wine at night. He also has about 2 servings of coffee a day   He is a very active gentleman with [employer] exercise program at least 3-4 days a week.   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   In addition to this standing his exercise, he does lots of gardening and uses a chain saw to cut wood.             family history includes Breast cancer in his sister; Heart failure in his mother.  ROS: A comprehensive Review of Systems - Negative except Recent sinus surgery for chronic sinusitis, and some occasional wheezing from  asthma for which he sees Dr. Lake Bells of [facility]. will  PHYSICAL EXAM BP 148/93  Pulse 84  Ht 6\' 2"  (1.88 m)  Wt 170 lb 8 oz (77.338 kg)  BMI 21.88 kg/m2 General appearance: alert, cooperative, appears stated age, no distress and Well-nourished, well-groomed. Answers questions appropriately. Neck: no carotid bruit, no JVD and supple, symmetrical, trachea midline Lungs: clear to auscultation bilaterally, normal percussion bilaterally and Nonlabored, good air movement Heart: regular rate and rhythm, S1, S2 normal, no murmur, click, rub or gallop, normal apical impulse and Occasional ectopy noted. Abdomen: soft, non-tender; bowel sounds normal; no masses,  no organomegaly Extremities: extremities normal, atraumatic, no cyanosis or edema Pulses: 2+ and symmetric Neurologic: Alert and oriented X 3, normal strength and tone. Normal symmetric reflexes. Normal coordination and gait  VFI:[id number] today: Yes Rate: 84 , Rhythm: Sinus Rhythm with first degree AV block, 2 PVCs  ECG from [facility]: Accelerated junctional rhythm with retrograde conduction was but the EKG was read it. My read is more sinus tachycardia, incomplete right bundle branch block and nonspecific ST changes. Heart rate 124.  Recent Labs: No recent labs. Lab work from May 2014 will be scanned.  ASSESSMENT / PLAN: Relatively stable, healthy gentleman with one episode of postoperative tachycardia. Again my impression and EKGs that this was sinus tachycardia as opposed to accelerated junctional tachycardia. Regardless he was treated with medication and resolved. There is no indication that he has had any further episodes. His exam is relatively benign I would not suggest any significant cardiac abnormality. There is no murmurs rubs or gallops that would suggest benefit from an echocardiogram. As there has been only one documented episode, I don't think any monitor would be helpful. He gets short of  breath simply with timolol eyedrops, as been reluctant to use beta blockers. Calcium channel blockers such as verapamil or diltiazem would be acceptable to use for blood pressure control if needed as we would be beneficial for a potential tachyarrhythmia. At this time, I don't begin any medication adjustments need to be made in order any studies need to be ordered. Possibly followup with him in about 6 months to see if he had any episodes. Certainly if there are any episodes of near syncope or syncope as suggested and had an arrhythmia, we would then consider using and monitor and proceed with a more detailed followup with echocardiogram plus or minus stress test.   Paroxysmal tachycardia, unspecified A single episode of what looked to be sinus tachycardia, that was asymptomatic. For now we'll simply monitor unless symptoms were truly  present. Would avoid beta blockers do to his asthma. At any rate control would be recommended, I would use a non-dihydropyridine calcium channel blockers such as verapamil or diltiazem.  HYPERTENSION Mildly elevated blood pressure today. This is being monitored by his primary physician. I am reluctant to treat anything for now.  Asymptomatic PVCs He is noted to be a palpate PVCs for several years now. They're asymptomatic, and relatively benign. He is not having any untoward cardiac symptoms that would suggest the need for further evaluation. At this point, I would simply monitor..    Orders Placed This Encounter  Procedures  . EKG 12-Lead    Order Specific Question:  Where should this test be performed    Answer:  [facility]   No orders of the defined types were placed in this encounter.    Followup: 6 months  months  Subrina Vecchiarelli W. Ellyn Hack, M.D., M.S. [facility] [address] [place],   [address]  [phone] Pager # [phone]

## 2013-06-23 NOTE — Assessment & Plan Note (Signed)
Mildly elevated blood pressure today. This is being monitored by his primary physician. I am reluctant to treat anything for now.

## 2013-06-23 NOTE — Patient Instructions (Signed)
Your physician wants you to follow-up in: 6 months You will receive a reminder letter in the mail two months in advance. If you don't receive a letter, please call our office to schedule the follow-up appointment.   You are having PVC's. Please monitor for sighs of rapid heart rate such as lightheaded, dizzy spells, or feeling faint.

## 2013-08-22 ENCOUNTER — Inpatient Hospital Stay: Payer: Self-pay | Admitting: Internal Medicine

## 2013-08-22 LAB — COMPREHENSIVE METABOLIC PANEL
ALK PHOS: 136 U/L — AB
Albumin: 3.6 g/dL (ref 3.4–5.0)
Anion Gap: 6 — ABNORMAL LOW (ref 7–16)
BUN: 10 mg/dL (ref 7–18)
Bilirubin,Total: 0.8 mg/dL (ref 0.2–1.0)
CALCIUM: 9 mg/dL (ref 8.5–10.1)
Chloride: 106 mmol/L (ref 98–107)
Co2: 28 mmol/L (ref 21–32)
Creatinine: 1.03 mg/dL (ref 0.60–1.30)
EGFR (African American): >60
Glucose: 90 mg/dL (ref 65–99)
Osmolality: 278 (ref 275–301)
Potassium: 3.8 mmol/L (ref 3.5–5.1)
SGOT(AST): 16 U/L (ref 15–37)
SGPT (ALT): 24 U/L (ref 12–78)
SODIUM: 140 mmol/L (ref 136–145)
Total Protein: 7 g/dL (ref 6.4–8.2)

## 2013-08-22 LAB — URINALYSIS, COMPLETE
BILIRUBIN, UR: NEGATIVE
Bacteria: NONE SEEN
Blood: NEGATIVE
Glucose,UR: NEGATIVE mg/dL (ref 0–75)
Leukocyte Esterase: NEGATIVE
Nitrite: NEGATIVE
Ph: 6 (ref 4.5–8.0)
Protein: NEGATIVE
RBC,UR: NONE SEEN /HPF (ref 0–5)
SPECIFIC GRAVITY: 1.006 (ref 1.003–1.030)
SQUAMOUS EPITHELIAL: NONE SEEN
WBC UR: NONE SEEN /HPF (ref 0–5)

## 2013-08-22 LAB — CBC
HCT: 50.4 % (ref 40.0–52.0)
HGB: 17 g/dL (ref 13.0–18.0)
MCH: 32.8 pg (ref 26.0–34.0)
MCHC: 33.8 g/dL (ref 32.0–36.0)
MCV: 97 fL (ref 80–100)
PLATELETS: 177 10*3/uL (ref 150–440)
RBC: 5.2 10*6/uL (ref 4.40–5.90)
RDW: 13.6 % (ref 11.5–14.5)
WBC: 9.2 10*3/uL (ref 3.8–10.6)

## 2013-08-22 LAB — TROPONIN I

## 2013-08-22 LAB — PRO B NATRIURETIC PEPTIDE: B-TYPE NATIURETIC PEPTID: 102 pg/mL (ref 0–125)

## 2013-08-23 LAB — CBC WITH DIFFERENTIAL/PLATELET
Basophil #: 0 10*3/uL (ref 0.0–0.1)
Basophil %: 0.2 %
EOS PCT: 0 %
Eosinophil #: 0 10*3/uL (ref 0.0–0.7)
HCT: 43 % (ref 40.0–52.0)
HGB: 13.7 g/dL (ref 13.0–18.0)
LYMPHS ABS: 1.1 10*3/uL (ref 1.0–3.6)
Lymphocyte %: 5.4 %
MCH: 31 pg (ref 26.0–34.0)
MCHC: 31.9 g/dL — AB (ref 32.0–36.0)
MCV: 97 fL (ref 80–100)
MONO ABS: 0.5 x10 3/mm (ref 0.2–1.0)
Monocyte %: 2.6 %
Neutrophil #: 19.1 10*3/uL — ABNORMAL HIGH (ref 1.4–6.5)
Neutrophil %: 91.8 %
PLATELETS: 176 10*3/uL (ref 150–440)
RBC: 4.43 10*6/uL (ref 4.40–5.90)
RDW: 13.5 % (ref 11.5–14.5)
WBC: 20.8 10*3/uL — ABNORMAL HIGH (ref 3.8–10.6)

## 2013-08-23 LAB — BASIC METABOLIC PANEL
Anion Gap: 7 (ref 7–16)
BUN: 19 mg/dL — AB (ref 7–18)
CO2: 24 mmol/L (ref 21–32)
Calcium, Total: 8.5 mg/dL (ref 8.5–10.1)
Chloride: 105 mmol/L (ref 98–107)
Creatinine: 1.23 mg/dL (ref 0.60–1.30)
EGFR (Non-African Amer.): 57 — ABNORMAL LOW
Glucose: 149 mg/dL — ABNORMAL HIGH (ref 65–99)
OSMOLALITY: 277 (ref 275–301)
Potassium: 4 mmol/L (ref 3.5–5.1)
SODIUM: 136 mmol/L (ref 136–145)

## 2013-09-06 ENCOUNTER — Ambulatory Visit (INDEPENDENT_AMBULATORY_CARE_PROVIDER_SITE_OTHER): Payer: Medicare Other | Admitting: Pulmonary Disease

## 2013-09-06 ENCOUNTER — Encounter (INDEPENDENT_AMBULATORY_CARE_PROVIDER_SITE_OTHER): Payer: Self-pay

## 2013-09-06 ENCOUNTER — Encounter: Payer: Self-pay | Admitting: Pulmonary Disease

## 2013-09-06 VITALS — BP 154/92 | HR 79 | Ht 74.0 in | Wt 170.8 lb

## 2013-09-06 DIAGNOSIS — J45909 Unspecified asthma, uncomplicated: Secondary | ICD-10-CM

## 2013-09-06 DIAGNOSIS — J309 Allergic rhinitis, unspecified: Secondary | ICD-10-CM

## 2013-09-06 MED ORDER — BUDESONIDE-FORMOTEROL FUMARATE 80-4.5 MCG/ACT IN AERO: 2.0000 | INHALATION_SPRAY | Freq: Two times a day (BID) | RESPIRATORY_TRACT | Status: DC

## 2013-09-06 NOTE — Assessment & Plan Note (Signed)
I am surprised the [name] had a flare of asthma requiring an admission to [facility] a few weeks ago.  I have reviewed his CT scan findings and there was nothing specific to suggest an underlying etiology.  I question the possibility of a mild pulmonary infection or less likely aspiration given some dependent GGO and mild interlobular septal thickening seen in the bases of his lungs.  He has never aspirated and his lungs are clear on exam today.  So without a clear infectious cause I agree that it may be reasonable for him to proceed with allergy testing at [facility] as recently suggested by Dr. Richardson Landry.  Plan: -allergy testing at [facility] -stop Asmanex for now -start Symbicort 2 puff bid 80/4.5 -f/u 4 months

## 2013-09-06 NOTE — Assessment & Plan Note (Signed)
See above.  Appreciate Dr. Reola Mosher care. Allergy testing at [facility].

## 2013-09-06 NOTE — Progress Notes (Signed)
Subjective:    Patient ID: [name], male    DOB: December 30, 1940, 73 y.o.   MRN: [id number]  Synopsis: [name] is a 73 year old male who smoked only in college and was diagnosed with asthma late and adulthood who transitioned his care from the [facility] office under Dr. Joya Gaskins to the [facility] pulmonary office in February 2014. He has simple spirometry consistent with obstruction. He has occasional bronchitis, when doing well no shortness of breath, and a lot of sinus congestion. HPI  07/20/2012 ROV--[name] comes to our clinic today to establish care for asthma. In the last month he's had significant sputum production which is green to yellow. This is associated with some chest soreness and cough. He does not have fever or chills or shortness of breath. He has some sinus congestion associated with this. He says that he produces sputum throughout the day him and it is not worse in the mornings. When he is doing well, he states that he has no shortness of breath or wheezing. He has been taking Asmanex for the last several months and has not had any respiratory complaints up until this episode. He was treated with Augmentin for 10 days earlier in January.  09/29/12 ROV -- [name] said that after her last visit his cough and sputum production improved with the Levaquin. He had no respiratory symptoms at all until about a week ago. At that time he started to develop sinus congestion, itchy eyes, and a runny nose. He has been worse in the evenings. This is been going on for 7-10 days. He is not taking over-the-counter antihistamine but he has been using Sudafed. He continues to use the Asmanex.  10/19/12 ROV -- [name] states that he has had 2 weeks of coughing, inflammation in wind pipe, wheezing, and even snoring some which is unusual for him.  He has not had a fever or chills.  He says that his head feels full.  He still has some sinus drainage.  He used generic claritin, Milta Deiters Med rinses, and flonase  before and says that it helped with his sinus congestion.  He has some slight dyspnea on exertion.  03/30/2013 ROV > [name] says that he has kept chest congestion since th last visit.  It has been causing hoarseness and won't go a way.  He has persistent nasal congestion in the left side more than the right.  He had a "submucosal resection" in 1970's.  He feels that the congestion is from both nose and lungs. It is usually white to yellow in color.  He feels that his breathing is OK, no dyspnea, no wheezing.  No rescue inhaler use lately.   09/06/2013 ROV >> Since I saw [name] the last time he has had sinus surgery by Dr. Richardson Landry.  Unfortunately he has had more mucus from down in his chest and "windpipe".  About three weeks ago he admitted to [facility] for what sounded like a OCPD flare.  He was treated with oxygen in the hospital but none in hospital.  He still has cough and chest congestion.   Otherwise he has been improving.  His sinus symptoms have improved in the last few weeks.  Switched back to  Past Medical History  Diagnosis Date  . Glaucoma   . Hypertension   . AB (asthmatic bronchitis)   . Basal cell cancer   . Cellulitis   . Hx MRSA infection   . Insomnia   . Upper back strain   .  Foreign body in skin   . Osteoarthrosis   . Mitral valve disorder   . Arthropathy   . Asthma   . Allergic rhinitis      Review of Systems  Constitutional: Negative for fever, chills and fatigue.  HENT: Positive for congestion. Negative for postnasal drip, rhinorrhea and voice change.   Respiratory: Positive for cough and wheezing. Negative for chest tightness.   Cardiovascular: Negative for chest pain, palpitations and leg swelling.       Objective:   Physical Exam   Filed Vitals:   09/06/13 1144  BP: 154/92  Pulse: 79  Height: 6\' 2"  (1.88 m)  Weight: 170 lb 12.8 oz (77.474 kg)  SpO2: 98%   Room air  Gen: well appearing, no acute distress HEENT: NCAT, EOMi, OP clear,  PULM: Good  air movement CV: RRR, no mgr, no JVD AB: BS+, nontender, no hsm Ext: warm, no edema, no clubbing, no cyanosis Derm: no rash or skin breakdown   October 2013 simple spirometry ratio 68%, FEV1 2.77 L (63% predicted) flow volume loop consistent with obstruction January 2007 full pulmonary function testing: Ratio 61%, FEV1 3.47 L (104% predicted), total lung capacity 8.7 L (119% predicted, residual volume 3. 04 L (114% predicted), DLCO 28.6 (125% predicted) 10/2012 CXR with ? Hilar mass 10/2012 CT chest >> normal, no mass; air cyst left lung 08/2013 CT chest>> patchy ground glass in bases, non-specific pattern, some focal areas of interlobular septal thickening that tracks with the ggo, cyst left lung base unchanged, no clear emphysema     Assessment & Plan:   Moderate persistent asthma I am surprised the [name] had a flare of asthma requiring an admission to [facility] a few weeks ago.  I have reviewed his CT scan findings and there was nothing specific to suggest an underlying etiology.  I question the possibility of a mild pulmonary infection or less likely aspiration given some dependent GGO and mild interlobular septal thickening seen in the bases of his lungs.  He has never aspirated and his lungs are clear on exam today.  So without a clear infectious cause I agree that it may be reasonable for him to proceed with allergy testing at [facility] as recently suggested by Dr. Richardson Landry.  Plan: -allergy testing at [facility] -stop Asmanex for now -start Symbicort 2 puff bid 80/4.5 -f/u 4 months  ALLERGIC RHINITIS See above.  Appreciate Dr. Reola Mosher care. Allergy testing at [facility].    Updated Medication List Outpatient Encounter Prescriptions as of 09/06/2013  Medication Sig  . aspirin 81 MG tablet Take 81 mg by mouth daily.  . brimonidine (ALPHAGAN) 0.15 % ophthalmic solution Place 1 drop into both eyes 2 (two) times daily.   . calcium carbonate (OS-CAL) 600 MG TABS Take 600 mg  by mouth daily.   . cetirizine (ZYRTEC) 10 MG tablet Take 10 mg by mouth daily.  . fluticasone (FLONASE) 50 MCG/ACT nasal spray Place 2 sprays into the nose daily.  Marland Kitchen latanoprost (XALATAN) 0.005 % ophthalmic solution Place 1 drop into both eyes daily.  . mometasone (ASMANEX 120 METERED DOSES) 220 MCG/INH inhaler Inhale 2 puffs into the lungs daily.  . Multiple Vitamin (MULTIVITAMIN) tablet Take 1 tablet by mouth daily.

## 2013-09-06 NOTE — Patient Instructions (Signed)
Take the symbicort 2 puffs twice a day no matter how you feel Get allergy testing with Dr. Reola Mosher office We will see you back in 4 months or sooner if needed

## 2013-09-08 ENCOUNTER — Ambulatory Visit: Payer: Medicare Other | Admitting: Pulmonary Disease

## 2013-11-17 LAB — PSA: PSA: 3.6

## 2013-12-13 ENCOUNTER — Ambulatory Visit (INDEPENDENT_AMBULATORY_CARE_PROVIDER_SITE_OTHER): Payer: Medicare Other | Admitting: Pulmonary Disease

## 2013-12-13 ENCOUNTER — Encounter: Payer: Self-pay | Admitting: Pulmonary Disease

## 2013-12-13 VITALS — BP 124/76 | HR 78 | Ht 74.0 in | Wt 174.0 lb

## 2013-12-13 DIAGNOSIS — R059 Cough, unspecified: Secondary | ICD-10-CM

## 2013-12-13 DIAGNOSIS — J45909 Unspecified asthma, uncomplicated: Secondary | ICD-10-CM

## 2013-12-13 DIAGNOSIS — R05 Cough: Secondary | ICD-10-CM

## 2013-12-13 MED ORDER — MONTELUKAST SODIUM 10 MG PO TABS: 10.0000 mg | ORAL_TABLET | Freq: Every day | ORAL | Status: DC

## 2013-12-13 NOTE — Patient Instructions (Signed)
Take the Singulair (montelukast) one pill daily no matter how you feel Stop Flonase Start Dymista 2 sprays each nostril twice a day If you find that the dymista is working better, call us for a prescription We will see you back in 4 months or sooner if needed

## 2013-12-13 NOTE — Progress Notes (Signed)
Subjective:    Patient ID: [name], male    DOB: [birth year], 73 y.o.   MRN: [id number]  Synopsis: Alyxander Kollmann is a 73 year old male who smoked only in college and was diagnosed with asthma late and adulthood who transitioned his care from the [facility] office under Dr. Joya Gaskins to the [facility] pulmonary office in February 2014. He has simple spirometry consistent with obstruction. He has occasional bronchitis, when doing well no shortness of breath, and a lot of sinus congestion. HPI  07/20/2012 ROV--Fernie comes to our clinic today to establish care for asthma. In the last month he's had significant sputum production which is green to yellow. This is associated with some chest soreness and cough. He does not have fever or chills or shortness of breath. He has some sinus congestion associated with this. He says that he produces sputum throughout the day him and it is not worse in the mornings. When he is doing well, he states that he has no shortness of breath or wheezing. He has been taking Asmanex for the last several months and has not had any respiratory complaints up until this episode. He was treated with Augmentin for 10 days earlier in January.  09/29/12 ROV -- Mr. Ruppe said that after her last visit his cough and sputum production improved with the Levaquin. He had no respiratory symptoms at all until about a week ago. At that time he started to develop sinus congestion, itchy eyes, and a runny nose. He has been worse in the evenings. This is been going on for 7-10 days. He is not taking over-the-counter antihistamine but he has been using Sudafed. He continues to use the Asmanex.  10/19/12 ROV -- Mr. Macek states that he has had 2 weeks of coughing, inflammation in wind pipe, wheezing, and even snoring some which is unusual for him.  He has not had a fever or chills.  He says that his head feels full.  He still has some sinus drainage.  He used generic claritin, Milta Deiters Med rinses, and flonase  before and says that it helped with his sinus congestion.  He has some slight dyspnea on exertion.  03/30/2013 ROV > Mr. Adachi says that he has kept chest congestion since th last visit.  It has been causing hoarseness and won't go a way.  He has persistent nasal congestion in the left side more than the right.  He had a "submucosal resection" in 1970's.  He feels that the congestion is from both nose and lungs. It is usually white to yellow in color.  He feels that his breathing is OK, no dyspnea, no wheezing.  No rescue inhaler use lately.   09/06/2013 ROV >> Since I saw Mr. Wickham the last time he has had sinus surgery by Dr. Richardson Landry.  Unfortunately he has had more mucus from down in his chest and "windpipe".  About three weeks ago he admitted to [facility] for what sounded like a OCPD flare.  He was treated with oxygen in the hospital but none in hospital.  He still has cough and chest congestion.   Otherwise he has been improving.  His sinus symptoms have improved in the last few weeks.  12/13/2013 ROV > Jahshua has been doing well from a breathing standpoint since the last visit. However, he continues to have significant postnasal drip and sinus congestion. He says that this runs down the back of his throat and into "his windpipe". This all has lead to more mucus production. He  has not had wheezing or chest tightness with this. He continues to take Symbicort twice a day. He has been exercising regularly and has not had dyspnea with that. He takes Flonase, Zyrtec, and uses a Netti pot daily.  Past Medical History  Diagnosis Date  . Glaucoma   . Hypertension   . AB (asthmatic bronchitis)   . Basal cell cancer   . Cellulitis   . Hx MRSA infection   . Insomnia   . Upper back strain   . Foreign body in skin   . Osteoarthrosis   . Mitral valve disorder   . Arthropathy   . Asthma   . Allergic rhinitis      Review of Systems  Constitutional: Negative for fever, chills and fatigue.  HENT: Positive for  congestion. Negative for postnasal drip, rhinorrhea and voice change.   Respiratory: Positive for cough and wheezing. Negative for chest tightness.   Cardiovascular: Negative for chest pain, palpitations and leg swelling.       Objective:   Physical Exam   Filed Vitals:   12/13/13 1602  BP: 124/76  Pulse: 78  Height: 6\' 2"  (1.88 m)  Weight: 174 lb (78.926 kg)  SpO2: 94%   Room air  Gen: well appearing, no acute distress HEENT: NCAT, EOMi, OP clear,  PULM: Good air movement, clear CV: RRR, no mgr, no JVD AB: BS+, nontender, no hsm Ext: warm, no edema, no clubbing, no cyanosis Derm: no rash or skin breakdown   October 2013 simple spirometry ratio 68%, FEV1 2.77 L (63% predicted) flow volume loop consistent with obstruction January 2007 full pulmonary function testing: Ratio 61%, FEV1 3.47 L (104% predicted), total lung capacity 8.7 L (119% predicted, residual volume 3. 04 L (114% predicted), DLCO 28.6 (125% predicted) 10/2012 CXR with ? Hilar mass 10/2012 CT chest >> normal, no mass; air cyst left lung 08/2013 CT chest>> patchy ground glass in bases, non-specific pattern, some focal areas of interlobular septal thickening that tracks with the ggo, cyst left lung base unchanged, no clear emphysema     Assessment & Plan:   Moderate persistent asthma From an asthma standpoint he is doing well. However, his allergic rhinitis and chronic postnasal drip continue to contribute to ongoing cough and mucus production.  Unfortunately, he never saw the ear nose and throat physicians as we discussed on the last visit. However, he does have a visit planned with them for 2-3 weeks from now.  Given his normal lung exam and lack of shortness of breath or wheezing there is no indication for me to change his inhalers today.  Plan: -Continue Symbicort -See below  Cough Due to postnasal drip.  Plan: -Add Singulair -Stop Flonase -Start [place]    Updated Medication List Outpatient  Encounter Prescriptions as of 12/13/2013  Medication Sig  . aspirin 81 MG tablet Take 81 mg by mouth daily.  . brimonidine (ALPHAGAN) 0.15 % ophthalmic solution Place 1 drop into both eyes 2 (two) times daily.   . budesonide-formoterol (SYMBICORT) 80-4.5 MCG/ACT inhaler Inhale 2 puffs into the lungs 2 (two) times daily.  . calcium carbonate (OS-CAL) 600 MG TABS Take 600 mg by mouth daily.   . cetirizine (ZYRTEC) 10 MG tablet Take 10 mg by mouth daily.  . fluticasone (FLONASE) 50 MCG/ACT nasal spray Place 2 sprays into the nose daily.  Marland Kitchen latanoprost (XALATAN) 0.005 % ophthalmic solution Place 1 drop into both eyes daily.  . Multiple Vitamin (MULTIVITAMIN) tablet Take 1 tablet by mouth daily.

## 2013-12-13 NOTE — Assessment & Plan Note (Signed)
Due to postnasal drip.  Plan: -Add Singulair -Stop Flonase -Start Dymista

## 2013-12-13 NOTE — Assessment & Plan Note (Signed)
From an asthma standpoint he is doing well. However, his allergic rhinitis and chronic postnasal drip continue to contribute to ongoing cough and mucus production.  Unfortunately, he never saw the ear nose and throat physicians as we discussed on the last visit. However, he does have a visit planned with them for 2-3 weeks from now.  Given his normal lung exam and lack of shortness of breath or wheezing there is no indication for me to change his inhalers today.  Plan: -Continue Symbicort -See below

## 2013-12-22 ENCOUNTER — Emergency Department: Payer: Self-pay | Admitting: Emergency Medicine

## 2013-12-22 LAB — TROPONIN I: TROPONIN-I: 0.03 ng/mL

## 2013-12-22 LAB — CK TOTAL AND CKMB (NOT AT ARMC)
CK, Total: 120 U/L
CK-MB: 1.6 ng/mL (ref 0.5–3.6)

## 2013-12-22 LAB — CBC
HCT: 48.6 % (ref 40.0–52.0)
HGB: 16.5 g/dL (ref 13.0–18.0)
MCH: 33.1 pg (ref 26.0–34.0)
MCHC: 33.9 g/dL (ref 32.0–36.0)
MCV: 98 fL (ref 80–100)
Platelet: 171 10*3/uL (ref 150–440)
RBC: 4.99 10*6/uL (ref 4.40–5.90)
RDW: 13.8 % (ref 11.5–14.5)
WBC: 8.3 10*3/uL (ref 3.8–10.6)

## 2013-12-22 LAB — COMPREHENSIVE METABOLIC PANEL
ALBUMIN: 3.4 g/dL (ref 3.4–5.0)
Alkaline Phosphatase: 86 U/L
Anion Gap: 7 (ref 7–16)
BILIRUBIN TOTAL: 0.4 mg/dL (ref 0.2–1.0)
BUN: 13 mg/dL (ref 7–18)
CALCIUM: 8.6 mg/dL (ref 8.5–10.1)
CHLORIDE: 106 mmol/L (ref 98–107)
CREATININE: 1.18 mg/dL (ref 0.60–1.30)
Co2: 26 mmol/L (ref 21–32)
EGFR (African American): >60
EGFR (Non-African Amer.): >60
Glucose: 90 mg/dL (ref 65–99)
OSMOLALITY: 277 (ref 275–301)
Potassium: 4.2 mmol/L (ref 3.5–5.1)
SGOT(AST): 25 U/L (ref 15–37)
SGPT (ALT): 24 U/L (ref 12–78)
Sodium: 139 mmol/L (ref 136–145)
Total Protein: 7 g/dL (ref 6.4–8.2)

## 2013-12-22 LAB — PRO B NATRIURETIC PEPTIDE: B-TYPE NATIURETIC PEPTID: 169 pg/mL (ref 0–450)

## 2013-12-27 LAB — CULTURE, BLOOD (SINGLE)

## 2013-12-28 LAB — BASIC METABOLIC PANEL
BUN: 12 mg/dL (ref 4–21)
Creatinine: 1.1 mg/dL (ref 0.6–1.3)
GLUCOSE: 88 mg/dL
Potassium: 4.1 mmol/L (ref 3.4–5.3)
Sodium: 140 mmol/L (ref 137–147)

## 2013-12-28 LAB — HEPATIC FUNCTION PANEL
ALT: 26 U/L (ref 10–40)
AST: 21 U/L (ref 14–40)

## 2013-12-30 ENCOUNTER — Telehealth: Payer: Self-pay | Admitting: Pulmonary Disease

## 2013-12-30 MED ORDER — AZELASTINE-FLUTICASONE 137-50 MCG/ACT NA SUSP: 2.0000 | Freq: Every day | NASAL | Status: DC

## 2013-12-30 NOTE — Telephone Encounter (Signed)
lmomtcb x1 rx sent 

## 2013-12-30 NOTE — Telephone Encounter (Signed)
Pt aware of below. Nothing further needed 

## 2014-01-26 ENCOUNTER — Other Ambulatory Visit: Payer: Self-pay

## 2014-01-26 MED ORDER — AZELASTINE-FLUTICASONE 137-50 MCG/ACT NA SUSP: 2.0000 | Freq: Every day | NASAL | Status: DC

## 2014-02-08 ENCOUNTER — Ambulatory Visit: Payer: Medicare Other | Admitting: Pulmonary Disease

## 2014-04-01 ENCOUNTER — Encounter: Payer: Self-pay | Admitting: Pulmonary Disease

## 2014-04-01 ENCOUNTER — Ambulatory Visit (INDEPENDENT_AMBULATORY_CARE_PROVIDER_SITE_OTHER): Payer: Medicare Other | Admitting: Pulmonary Disease

## 2014-04-01 ENCOUNTER — Ambulatory Visit (INDEPENDENT_AMBULATORY_CARE_PROVIDER_SITE_OTHER)
Admission: RE | Admit: 2014-04-01 | Discharge: 2014-04-01 | Disposition: A | Discharge status: Home / Self Care | Payer: Medicare Other | Source: Ambulatory Visit | Attending: Pulmonary Disease | Admitting: Pulmonary Disease

## 2014-04-01 VITALS — BP 142/76 | HR 81 | Ht 74.0 in | Wt 178.0 lb

## 2014-04-01 DIAGNOSIS — J4541 Moderate persistent asthma with (acute) exacerbation: Secondary | ICD-10-CM

## 2014-04-01 DIAGNOSIS — R9389 Abnormal findings on diagnostic imaging of other specified body structures: Secondary | ICD-10-CM | POA: Insufficient documentation

## 2014-04-01 DIAGNOSIS — R05 Cough: Secondary | ICD-10-CM

## 2014-04-01 DIAGNOSIS — R059 Cough, unspecified: Secondary | ICD-10-CM

## 2014-04-01 DIAGNOSIS — R938 Abnormal findings on diagnostic imaging of other specified body structures: Secondary | ICD-10-CM

## 2014-04-01 MED ORDER — MONTELUKAST SODIUM 10 MG PO TABS: 10.0000 mg | ORAL_TABLET | Freq: Every day | ORAL | Status: DC

## 2014-04-01 NOTE — Patient Instructions (Signed)
We call you with the results of today's chest x-ray Keep taking the symbicort and flonase, add back the singulair (montelukast, generic is OK) We will see you back in 2-3 months or sooner if needed

## 2014-04-01 NOTE — Progress Notes (Signed)
Subjective:    Patient ID: [name], male    DOB: September 18, 1940, 73 y.o.   MRN: [id number]  Synopsis: [name] is a 73 year old male who smoked only in college and was diagnosed with asthma late and adulthood who transitioned his care from the [facility] office under Dr. Joya Gaskins to the [facility] pulmonary office in February 2014. He has simple spirometry consistent with obstruction. He has occasional bronchitis, when doing well no shortness of breath, and a lot of sinus congestion. HPI  Chief Complaint  Patient presents with  . Follow-up    Pt c/o some prod cough clear mucus.  Had a bout of dizziness last week, went to hospital after being evaluated by EMT.     10/`11/2013 ROV> Jones's PCP put in on a zpack and prednisone for increased chest congestion and wheezing and coughing up mucus.  This is worse at night but is better now on the medicines.   He started imunotherapy recently.  He was found to have a lot of allergies. He had a flu shot and was put on mucinex and benadryl.  He had a dizzy spell recently while on these medications.  He continues to take the symbicort and zyrtec but not the singulair.  Past Medical History  Diagnosis Date  . Glaucoma   . Hypertension   . AB (asthmatic bronchitis)   . Basal cell cancer   . Cellulitis   . Hx MRSA infection   . Insomnia   . Upper back strain   . Foreign body in skin   . Osteoarthrosis   . Mitral valve disorder   . Arthropathy   . Asthma   . Allergic rhinitis      Review of Systems  Constitutional: Negative for fever, chills and fatigue.  HENT: Positive for congestion. Negative for postnasal drip, rhinorrhea and voice change.   Respiratory: Positive for cough and wheezing. Negative for chest tightness.   Cardiovascular: Negative for chest pain, palpitations and leg swelling.       Objective:   Physical Exam   Filed Vitals:   04/01/14 1020  BP: 142/76  Pulse: 81  Height: 6\' 2"  (1.88 m)  Weight: 178 lb (80.74 kg)   SpO2: 98%   Room air  Gen: well appearing, no acute distress HEENT: NCAT, EOMi, OP clear,  PULM: Mild wheezing right upper lobe, otherwise clear CV: RRR, no mgr, no JVD AB: BS+, nontender, no hsm Ext: warm, no edema, no clubbing, no cyanosis Derm: no rash or skin breakdown   October 2013 simple spirometry ratio 68%, FEV1 2.77 L (63% predicted) flow volume loop consistent with obstruction January 2007 full pulmonary function testing: Ratio 61%, FEV1 3.47 L (104% predicted), total lung capacity 8.7 L (119% predicted, residual volume 3. 04 L (114% predicted), DLCO 28.6 (125% predicted) 10/2012 CXR with ? Hilar mass 10/2012 CT chest >> normal, no mass; air cyst left lung 08/2013 CT chest>> patchy ground glass in bases, non-specific pattern, some focal areas of interlobular septal thickening that tracks with the ggo, cyst left lung base unchanged, no clear emphysema     Assessment & Plan:   Extrinsic asthma He has had another exacerbation in the last week.  He is recovering well.  His allergy testing was widely positive and he has now been started on immunotherapy.    Plan: -complete prednisone taper  -continue symbicort -add back montelukast -continue zyrtec -continue immunotherapy -if no improvement after 6 months or so of immunotherapy then consider repeating IgE vs  clinical trial for Dupilumab  Abnormal CXR He was found to have some scarring in the right lower lobe on a recent CXR at an ED.  I will repeat a CXR today to follow this up.  His lungs are clear today.  He had a cyst and some mild RML scarring on a CT chest in 2014, so I think that is what they were seeing.    Updated Medication List Outpatient Encounter Prescriptions as of 04/01/2014  Medication Sig  . aspirin 81 MG tablet Take 81 mg by mouth daily.  . brimonidine (ALPHAGAN) 0.15 % ophthalmic solution Place 1 drop into both eyes 2 (two) times daily.   . budesonide-formoterol (SYMBICORT) 80-4.5 MCG/ACT inhaler  Inhale 2 puffs into the lungs 2 (two) times daily.  . calcium carbonate (OS-CAL) 600 MG TABS Take 600 mg by mouth daily.   . cetirizine (ZYRTEC) 10 MG tablet Take 10 mg by mouth daily.  . fluticasone (FLONASE) 50 MCG/ACT nasal spray Place 2 sprays into the nose daily.  Marland Kitchen latanoprost (XALATAN) 0.005 % ophthalmic solution Place 1 drop into both eyes daily.  . Multiple Vitamin (MULTIVITAMIN) tablet Take 1 tablet by mouth daily.  . predniSONE (DELTASONE) 10 MG tablet Take 10 mg by mouth. Taper pack  . [DISCONTINUED] Azelastine-Fluticasone 137-50 MCG/ACT SUSP Place 2 puffs into the nose daily.  . montelukast (SINGULAIR) 10 MG tablet Take 1 tablet (10 mg total) by mouth at bedtime.  . [DISCONTINUED] montelukast (SINGULAIR) 10 MG tablet Take 1 tablet (10 mg total) by mouth at bedtime.

## 2014-04-01 NOTE — Assessment & Plan Note (Addendum)
He has had another exacerbation in the last week.  He is recovering well.  His allergy testing was widely positive and he has now been started on immunotherapy.    Plan: -complete prednisone taper  -continue symbicort -add back montelukast -continue zyrtec -continue immunotherapy -if no improvement after 6 months or so of immunotherapy then consider repeating IgE vs clinical trial for Dupilumab

## 2014-04-01 NOTE — Assessment & Plan Note (Signed)
He was found to have some scarring in the right lower lobe on a recent CXR at an ED.  I will repeat a CXR today to follow this up.  His lungs are clear today.  He had a cyst and some mild RML scarring on a CT chest in 2014, so I think that is what they were seeing.

## 2014-04-06 NOTE — Progress Notes (Signed)
Quick Note:  Pt aware of results/recs, will get cxr at next visit in December but call us sooner if he is not improving. ______

## 2014-04-07 ENCOUNTER — Inpatient Hospital Stay: Payer: Self-pay | Admitting: Internal Medicine

## 2014-04-07 LAB — COMPREHENSIVE METABOLIC PANEL
AST: 15 U/L (ref 15–37)
Albumin: 3 g/dL — ABNORMAL LOW (ref 3.4–5.0)
Alkaline Phosphatase: 94 U/L
Anion Gap: 6 — ABNORMAL LOW (ref 7–16)
BILIRUBIN TOTAL: 0.8 mg/dL (ref 0.2–1.0)
BUN: 13 mg/dL (ref 7–18)
CREATININE: 1.18 mg/dL (ref 0.60–1.30)
Calcium, Total: 8.4 mg/dL — ABNORMAL LOW (ref 8.5–10.1)
Chloride: 101 mmol/L (ref 98–107)
Co2: 28 mmol/L (ref 21–32)
EGFR (Non-African Amer.): >60
Glucose: 120 mg/dL — ABNORMAL HIGH (ref 65–99)
Osmolality: 271 (ref 275–301)
Potassium: 3.9 mmol/L (ref 3.5–5.1)
SGPT (ALT): 24 U/L
SODIUM: 135 mmol/L — AB (ref 136–145)
TOTAL PROTEIN: 7.2 g/dL (ref 6.4–8.2)

## 2014-04-07 LAB — URINALYSIS, COMPLETE
BLOOD: NEGATIVE
Bacteria: NONE SEEN
Bilirubin,UR: NEGATIVE
Glucose,UR: NEGATIVE mg/dL (ref 0–75)
KETONE: NEGATIVE
Leukocyte Esterase: NEGATIVE
Nitrite: NEGATIVE
Ph: 8 (ref 4.5–8.0)
Protein: NEGATIVE
RBC,UR: <1 /HPF (ref 0–5)
SQUAMOUS EPITHELIAL: NONE SEEN
Specific Gravity: 1.012 (ref 1.003–1.030)
WBC UR: <1 /HPF (ref 0–5)

## 2014-04-07 LAB — CBC WITH DIFFERENTIAL/PLATELET
BASOS ABS: 0 10*3/uL (ref 0.0–0.1)
BASOS PCT: 0.2 %
EOS PCT: 0.3 %
Eosinophil #: 0 10*3/uL (ref 0.0–0.7)
HCT: 48.4 % (ref 40.0–52.0)
HGB: 15.7 g/dL (ref 13.0–18.0)
Lymphocyte #: 0.8 10*3/uL — ABNORMAL LOW (ref 1.0–3.6)
Lymphocyte %: 4.2 %
MCH: 31.8 pg (ref 26.0–34.0)
MCHC: 32.4 g/dL (ref 32.0–36.0)
MCV: 98 fL (ref 80–100)
Monocyte #: 1 x10 3/mm (ref 0.2–1.0)
Monocyte %: 5.7 %
Neutrophil #: 16.3 10*3/uL — ABNORMAL HIGH (ref 1.4–6.5)
Neutrophil %: 89.6 %
Platelet: 233 10*3/uL (ref 150–440)
RBC: 4.93 10*6/uL (ref 4.40–5.90)
RDW: 13.8 % (ref 11.5–14.5)
WBC: 18.2 10*3/uL — AB (ref 3.8–10.6)

## 2014-04-08 LAB — BASIC METABOLIC PANEL
Anion Gap: 7 (ref 7–16)
BUN: 15 mg/dL (ref 7–18)
CALCIUM: 7.9 mg/dL — AB (ref 8.5–10.1)
Chloride: 104 mmol/L (ref 98–107)
Co2: 27 mmol/L (ref 21–32)
Creatinine: 1.23 mg/dL (ref 0.60–1.30)
EGFR (African American): >60
Glucose: 134 mg/dL — ABNORMAL HIGH (ref 65–99)
OSMOLALITY: 278 (ref 275–301)
Potassium: 4.4 mmol/L (ref 3.5–5.1)
Sodium: 138 mmol/L (ref 136–145)

## 2014-04-08 LAB — CBC WITH DIFFERENTIAL/PLATELET
Basophil #: 0 10*3/uL (ref 0.0–0.1)
Basophil %: 0.1 %
Eosinophil #: 0 10*3/uL (ref 0.0–0.7)
Eosinophil %: 0 %
HCT: 42.8 % (ref 40.0–52.0)
HGB: 14.1 g/dL (ref 13.0–18.0)
Lymphocyte #: 0.5 10*3/uL — ABNORMAL LOW (ref 1.0–3.6)
Lymphocyte %: 4.8 %
MCH: 32.3 pg (ref 26.0–34.0)
MCHC: 32.9 g/dL (ref 32.0–36.0)
MCV: 98 fL (ref 80–100)
Monocyte #: 0.2 x10 3/mm (ref 0.2–1.0)
Monocyte %: 2 %
NEUTROS PCT: 93.1 %
Neutrophil #: 9.4 10*3/uL — ABNORMAL HIGH (ref 1.4–6.5)
Platelet: 208 10*3/uL (ref 150–440)
RBC: 4.35 10*6/uL — ABNORMAL LOW (ref 4.40–5.90)
RDW: 13.6 % (ref 11.5–14.5)
WBC: 10.1 10*3/uL (ref 3.8–10.6)

## 2014-04-10 LAB — EXPECTORATED SPUTUM ASSESSMENT W GRAM STAIN, RFLX TO RESP C

## 2014-04-12 LAB — CULTURE, BLOOD (SINGLE)

## 2014-05-02 ENCOUNTER — Encounter: Payer: Self-pay | Admitting: *Deleted

## 2014-05-03 ENCOUNTER — Ambulatory Visit (INDEPENDENT_AMBULATORY_CARE_PROVIDER_SITE_OTHER)
Admission: RE | Admit: 2014-05-03 | Discharge: 2014-05-03 | Disposition: A | Discharge status: Home / Self Care | Payer: Medicare Other | Source: Ambulatory Visit | Attending: Pulmonary Disease | Admitting: Pulmonary Disease

## 2014-05-03 ENCOUNTER — Ambulatory Visit (INDEPENDENT_AMBULATORY_CARE_PROVIDER_SITE_OTHER): Payer: Medicare Other | Admitting: Pulmonary Disease

## 2014-05-03 ENCOUNTER — Encounter: Payer: Self-pay | Admitting: Pulmonary Disease

## 2014-05-03 VITALS — BP 118/68 | HR 74 | Temp 98.0°F | Ht 74.0 in | Wt 176.0 lb

## 2014-05-03 DIAGNOSIS — J189 Pneumonia, unspecified organism: Secondary | ICD-10-CM | POA: Insufficient documentation

## 2014-05-03 DIAGNOSIS — J4541 Moderate persistent asthma with (acute) exacerbation: Secondary | ICD-10-CM

## 2014-05-03 NOTE — Progress Notes (Signed)
Subjective:    Patient ID: [name], male    DOB: July 30, 1940, 73 y.o.   MRN: [id number]  Synopsis: [name] is a 73 year old male who smoked only in college and was diagnosed with asthma late and adulthood who transitioned his care from the [facility] office under Dr. Joya [name] to the [facility] pulmonary office in February 2014. He has simple spirometry consistent with obstruction. He has occasional bronchitis, when doing well no shortness of breath, and a lot of sinus congestion. HPI  Chief Complaint  Patient presents with  . Follow-up    Pt was at [facility] 3 weeks ago for PNA.  Pt c/o prod cough with white mucus.  No other complaints.    05/03/2014 ROV > [name] was hospitalized for pneumonia after I saw him the last time.  He had been treated with antibiotics and completed the Zpack he was on when I saw him the last time but he started having chills suddenly about a week later with a fever.  He was admitted for pneumonia and completed a course of cefuroxime and azithromycine.  He coughs up mucus about 6 times per day.  He continues to have sinus drainage.  He started immunotherapy 3-4 weeks ago.  He currently takes Symbicort.  Past Medical History  Diagnosis Date  . Glaucoma   . Hypertension   . AB (asthmatic bronchitis)   . Basal cell cancer   . Cellulitis   . Hx MRSA infection   . Insomnia   . Upper back strain   . Foreign body in skin   . Osteoarthrosis   . Mitral valve disorder   . Arthropathy   . Asthma   . Allergic rhinitis   . Insomnia   . Hx MRSA infection      Review of Systems  Constitutional: Negative for fever, chills and fatigue.  HENT: Positive for congestion. Negative for postnasal drip, rhinorrhea and voice change.   Respiratory: Positive for cough. Negative for chest tightness and wheezing.   Cardiovascular: Negative for chest pain, palpitations and leg swelling.       Objective:   Physical Exam   Filed Vitals:   05/03/14 0948  BP: 118/68  Pulse:  74  Temp: 98 F (36.7 C)  TempSrc: Oral  Height: 6\' 2"  (1.88 m)  Weight: 176 lb (79.833 kg)  SpO2: 96%   Room air  Gen: well appearing, no acute distress HEENT: NCAT, EOMi, OP clear,  PULM: few crackles right lower lobe no wheezing CV: RRR, no mgr, no JVD AB: BS+, nontender, no hsm Ext: warm, no edema, no clubbing, no cyanosis Derm: no rash or skin breakdown   October 2013 simple spirometry ratio 68%, FEV1 2.77 L (63% predicted) flow volume loop consistent with obstruction January 2007 full pulmonary function testing: Ratio 61%, FEV1 3.47 L (104% predicted), total lung capacity 8.7 L (119% predicted, residual volume 3. 04 L (114% predicted), DLCO 28.6 (125% predicted) 10/2012 CXR with ? Hilar mass 10/2012 CT chest >> normal, no mass; air cyst left lung 08/2013 CT chest>> patchy ground glass in bases, non-specific pattern, some focal areas of interlobular septal thickening that tracks with the ggo, cyst left lung base unchanged, no clear emphysema October 2015 chest x-ray > right lower lobe infiltrate     Assessment & Plan:   CAP (community acquired pneumonia) Unfortunately [name] failed the azithromycin therapy he was taking when I saw him the last time and he required hospitalization. This likely means that he had an  organism that was resistant to azithromycin. He is doing much better now after being treated with a beta lactam. He has been recovering as expected at the age of 13 with ongoing asthma.  Plan: -Obtain chest x-ray to ensure that right lower lobe infiltrate is resolving -Will obtain a sputum culture to make sure were not dealing with resistant or atypical organisms -If no improvement in cough in the next 6 weeks (which is what I would expect at his age with asthma) then come back to see me sooner  Extrinsic asthma Josph has moderate persistent asthma and I have been disappointed that his symptoms have not been better controlled over the last year. I had hoped to downgrade  his therapy to an inhaled steroid alone but that has not been possible.  If he does not have significant improvement after 6 months of immunotherapy then we need to consider evaluating him for Xolair or a clinical trial of a biologic agent for asthma.  OK to have flu shot today    Updated Medication List Outpatient Encounter Prescriptions as of 05/03/2014  Medication Sig  . aspirin 81 MG tablet Take 81 mg by mouth daily.  . brimonidine (ALPHAGAN) 0.15 % ophthalmic solution Place 1 drop into both eyes 2 (two) times daily.   . budesonide-formoterol (SYMBICORT) 80-4.5 MCG/ACT inhaler Inhale 2 puffs into the lungs 2 (two) times daily.  . calcium carbonate (OS-CAL) 600 MG TABS Take 600 mg by mouth daily.   . cetirizine (ZYRTEC) 10 MG tablet Take 10 mg by mouth daily.  Marland Kitchen latanoprost (XALATAN) 0.005 % ophthalmic solution Place 1 drop into both eyes daily.  . montelukast (SINGULAIR) 10 MG tablet Take 1 tablet (10 mg total) by mouth at bedtime.  . Multiple Vitamin (MULTIVITAMIN) tablet Take 1 tablet by mouth daily.  . [DISCONTINUED] fluticasone (FLONASE) 50 MCG/ACT nasal spray Place 2 sprays into the nose daily.  . [DISCONTINUED] predniSONE (DELTASONE) 10 MG tablet Take 10 mg by mouth. Taper pack

## 2014-05-03 NOTE — Assessment & Plan Note (Addendum)
Unfortunately [name] failed the azithromycin therapy he was taking when I saw him the last time and he required hospitalization. This likely means that he had an organism that was resistant to azithromycin. He is doing much better now after being treated with a beta lactam. He has been recovering as expected at the age of 77 with ongoing asthma.  Plan: -Obtain chest x-ray to ensure that right lower lobe infiltrate is resolving -Will obtain a sputum culture to make sure were not dealing with resistant or atypical organisms -If no improvement in cough in the next 6 weeks (which is what I would expect at his age with asthma) then come back to see me sooner

## 2014-05-03 NOTE — Patient Instructions (Signed)
Keep taking the symbicort as you are doing Please give [place] a sample of your mucus Go to the [facility] office for a chest X- If the cough with mucus production does not clear up in 6 months let us know We will see you back in 3 months or sooner if needed

## 2014-05-03 NOTE — Assessment & Plan Note (Addendum)
[name] has moderate persistent asthma and I have been disappointed that his symptoms have not been better controlled over the last year. I had hoped to downgrade his therapy to an inhaled steroid alone but that has not been possible.  If he does not have significant improvement after 6 months of immunotherapy then we need to consider evaluating him for Xolair or a clinical trial of a biologic agent for asthma.  OK to have flu shot today

## 2014-05-04 ENCOUNTER — Telehealth: Payer: Self-pay

## 2014-05-04 ENCOUNTER — Other Ambulatory Visit: Payer: Self-pay | Admitting: Pulmonary Disease

## 2014-05-04 DIAGNOSIS — J189 Pneumonia, unspecified organism: Secondary | ICD-10-CM

## 2014-05-04 NOTE — Telephone Encounter (Signed)
Pt aware of results and recs.  Ct ordered.  Nothing further needed.

## 2014-05-04 NOTE — Addendum Note (Signed)
Addended by: Karlene Einstein D on: 05/04/2014 08:12 AM   Modules accepted: Orders

## 2014-05-11 ENCOUNTER — Ambulatory Visit: Payer: Self-pay | Admitting: Pulmonary Disease

## 2014-05-19 ENCOUNTER — Telehealth: Payer: Self-pay

## 2014-05-19 DIAGNOSIS — J690 Pneumonitis due to inhalation of food and vomit: Secondary | ICD-10-CM

## 2014-05-19 NOTE — Telephone Encounter (Signed)
Spoke with pt about ct chest results.  He is aware of results and recs.  Modified barium swallow ordered.  Nothing further needed at this time.

## 2014-05-19 NOTE — Telephone Encounter (Signed)
-----   Message from Juanito Doom, MD sent at 05/19/2014  1:36 PM EST ----- A, PLease let him know that the radiologist felt that the pneumonia he has expierienced may be related to aspiration.  I would like him to have a modified barium swallow for this to evaluate further.  Thanks B

## 2014-06-01 LAB — FUNGUS CULTURE W SMEAR: SMEAR RESULT: NONE SEEN

## 2014-06-02 NOTE — Progress Notes (Signed)
Quick Note:  lmtcb X1 for pt. ______ 

## 2014-06-03 ENCOUNTER — Ambulatory Visit: Payer: Self-pay | Admitting: Pulmonary Disease

## 2014-06-03 NOTE — Progress Notes (Signed)
Quick Note:  lmtcb X2 for pt. ______ 

## 2014-06-06 ENCOUNTER — Encounter: Payer: Self-pay | Admitting: Pulmonary Disease

## 2014-06-06 ENCOUNTER — Ambulatory Visit: Payer: Medicare Other | Admitting: Pulmonary Disease

## 2014-06-06 ENCOUNTER — Telehealth: Payer: Self-pay | Admitting: Pulmonary Disease

## 2014-06-06 ENCOUNTER — Ambulatory Visit (INDEPENDENT_AMBULATORY_CARE_PROVIDER_SITE_OTHER): Payer: Medicare Other | Admitting: Pulmonary Disease

## 2014-06-06 ENCOUNTER — Other Ambulatory Visit (INDEPENDENT_AMBULATORY_CARE_PROVIDER_SITE_OTHER): Payer: Medicare Other

## 2014-06-06 VITALS — BP 136/82 | HR 72 | Ht 74.0 in | Wt 179.0 lb

## 2014-06-06 DIAGNOSIS — J189 Pneumonia, unspecified organism: Secondary | ICD-10-CM

## 2014-06-06 DIAGNOSIS — J4540 Moderate persistent asthma, uncomplicated: Secondary | ICD-10-CM

## 2014-06-06 DIAGNOSIS — R9389 Abnormal findings on diagnostic imaging of other specified body structures: Secondary | ICD-10-CM

## 2014-06-06 DIAGNOSIS — R938 Abnormal findings on diagnostic imaging of other specified body structures: Secondary | ICD-10-CM

## 2014-06-06 LAB — CBC WITH DIFFERENTIAL/PLATELET
BASOS ABS: 0 10*3/uL (ref 0.0–0.1)
Basophils Relative: 0.6 % (ref 0.0–3.0)
EOS PCT: 12.6 % — AB (ref 0.0–5.0)
Eosinophils Absolute: 1 10*3/uL — ABNORMAL HIGH (ref 0.0–0.7)
HCT: 49 % (ref 39.0–52.0)
Hemoglobin: 16 g/dL (ref 13.0–17.0)
LYMPHS ABS: 2.3 10*3/uL (ref 0.7–4.0)
LYMPHS PCT: 30.1 % (ref 12.0–46.0)
MCHC: 32.7 g/dL (ref 30.0–36.0)
MCV: 96.9 fl (ref 78.0–100.0)
MONOS PCT: 11.7 % (ref 3.0–12.0)
Monocytes Absolute: 0.9 10*3/uL (ref 0.1–1.0)
Neutro Abs: 3.5 10*3/uL (ref 1.4–7.7)
Neutrophils Relative %: 45 % (ref 43.0–77.0)
Platelets: 270 10*3/uL (ref 150.0–400.0)
RBC: 5.06 Mil/uL (ref 4.22–5.81)
RDW: 14 % (ref 11.5–15.5)
WBC: 7.7 10*3/uL (ref 4.0–10.5)

## 2014-06-06 MED ORDER — ALBUTEROL SULFATE (2.5 MG/3ML) 0.083% IN NEBU: 2.5000 mg | INHALATION_SOLUTION | Freq: Four times a day (QID) | RESPIRATORY_TRACT | Status: DC | PRN

## 2014-06-06 MED ORDER — FLUTTER DEVI: Status: DC

## 2014-06-06 MED ORDER — ACETYLCYSTEINE 10 % IN SOLN: 4.0000 mL | Freq: Two times a day (BID) | RESPIRATORY_TRACT | Status: DC

## 2014-06-06 NOTE — Telephone Encounter (Signed)
Dr Lake Bells, please advise if okay to take PRN off Rx for Albuterol. [facility] Joycelyn Schmid) calling stating that per Medicare Part B, they will not cover this Rx if it states PRN.  Please advise BQ. Thanks.    ---------- Below is for previous message which is now closed (dated 06/06/14) regarding Mucomyst. Pharmacy has been contacted to give instruction changes per BQ--spoke to Rich, states that medication had to be ordered and will arrive 06/07/14. Instruction changes have been given to Rich to add to Rx. Daneil Dan has contacted patient to explain to patient--expressed understanding. Nothing further needed.

## 2014-06-06 NOTE — Telephone Encounter (Signed)
Spoke with [employer] and she stated that they can only get the 20% of the mucomyst.  BQ please advise if this is ok?  thanks

## 2014-06-06 NOTE — Progress Notes (Signed)
Subjective:    Patient ID: [name], male    DOB: 1941-04-27, 73 y.o.   MRN: [id number]  Synopsis: [name] is a 73 year old male who smoked only in college and was diagnosed with asthma late and adulthood who transitioned his care from the [facility] office under Dr. Joya [name] to the [facility] pulmonary office in February 2014. He has simple spirometry consistent with obstruction. He has occasional bronchitis, when doing well no shortness of breath, and a lot of sinus congestion. HPI  Chief Complaint  Patient presents with  . Follow-up    pt c/o prod cough with white mucus, sob with exertion, sinus congestion, R side of chest sore with exertion X1 week.  Pt states his symbicort is making him dizzy, shaky, pass out.     06/06/2014 [name] says that he has been sore in his right chest.  He wonders if his sinus drainage is running down in to his lungs.  He says that he is producing a lot of mucus, specifically when he lies flat.  He feels like he is drowning at night.  He has had a couple of episodes when he blacks out.  He has been getting dizzingness when he turns to quickly or moves too quickly.  He thinks that it is related to his symbicort because he decreased the dose to 1 puff bid and improved significantly.   Past Medical History  Diagnosis Date  . Glaucoma   . Hypertension   . AB (asthmatic bronchitis)   . Basal cell cancer   . Cellulitis   . Hx MRSA infection   . Insomnia   . Upper back strain   . Foreign body in skin   . Osteoarthrosis   . Mitral valve disorder   . Arthropathy   . Asthma   . Allergic rhinitis   . Insomnia   . Hx MRSA infection      Review of Systems  Constitutional: Negative for fever, chills and fatigue.  HENT: Positive for congestion. Negative for postnasal drip, rhinorrhea and voice change.   Respiratory: Positive for cough. Negative for chest tightness and wheezing.   Cardiovascular: Negative for chest pain, palpitations and leg swelling.        Objective:   Physical Exam   Filed Vitals:   06/06/14 1406  BP: 136/82  Pulse: 72  Height: 6\' 2"  (1.88 m)  Weight: 179 lb (81.194 kg)  SpO2: 96%   Room air  Gen: well appearing, no acute distress HEENT: NCAT, EOMi, OP clear,  PULM: CTA B today CV: RRR, no mgr, no JVD AB: BS+, nontender, no hsm Ext: warm, no edema, no clubbing, no cyanosis Derm: no rash or skin breakdown   October 2013 simple spirometry ratio 68%, FEV1 2.77 L (63% predicted) flow volume loop consistent with obstruction January 2007 full pulmonary function testing: Ratio 61%, FEV1 3.47 L (104% predicted), total lung capacity 8.7 L (119% predicted, residual volume 3. 04 L (114% predicted), DLCO 28.6 (125% predicted) 10/2012 CXR with ? Hilar mass 10/2012 CT chest >> normal, no mass; air cyst left lung 08/2013 CT chest>> patchy ground glass in bases, non-specific pattern, some focal areas of interlobular septal thickening that tracks with the ggo, cyst left lung base unchanged, no clear emphysema October 2015 chest x-ray > right lower lobe infiltrate 04/2014 CT chest > dependent lower lobe airspace opacities worrisome for aspiration pneumonia December 2015 modified barium swallow> negative for aspiration      Assessment & Plan:   CAP (  community acquired pneumonia) This is a recurrent problem which puts him at risk for hospitalization.  I reviewed the images from the recent CT chest. He has thick mucus in the bases of his right lung as well as some in the left lung, mostly located in the airways. Today we discussed dust the differential diagnosis of this, initially we were concerned about aspiration but his modified barium swallow was normal. He thinks that his very copious amounts of sinus secretions are contributing to this. Other possibilities are atypical or resistant infections.  His recent sputum culture is negative for bacteria, fungal, and AFB organisms.  Today I recommended a bronchoscopy but he would  prefer to forego this for now. In lieu of that decision, I think that he should go back to [place] [facility] to see what else can be done for his sinus disease, and I am going to add aggressive pulmonary toilet to his regimen for now.  Plan: -Add flutter valve -Add Mucomyst 2 days, I advised that this can cause hemoptysis if he uses it more than 2 days -Follow-up with [place] [facility] for sinus symptoms -Obtain serum IgE, Aspergillus specific antigens, and CBC with differential -If no improvement by end of January than we will perform a bronchoscopy  Extrinsic asthma His moderate persistent asthma is stable right now.  He believes that he is experiencing dizziness related to the Symbicort. He would like to try something else, I have advised Advair 1 puff twice a day. It is very likely that his formulary is going to change in January with his insurance so I have provided samples and he will let us know what is covered by his formulary.    Updated Medication List Outpatient Encounter Prescriptions as of 06/06/2014  Medication Sig  . aspirin 81 MG tablet Take 81 mg by mouth daily.  . brimonidine (ALPHAGAN) 0.15 % ophthalmic solution Place 1 drop into both eyes 2 (two) times daily.   . budesonide-formoterol (SYMBICORT) 80-4.5 MCG/ACT inhaler Inhale 2 puffs into the lungs 2 (two) times daily.  . calcium carbonate (OS-CAL) 600 MG TABS Take 600 mg by mouth daily.   . cetirizine (ZYRTEC) 10 MG tablet Take 10 mg by mouth daily.  Marland Kitchen latanoprost (XALATAN) 0.005 % ophthalmic solution Place 1 drop into both eyes daily.  . montelukast (SINGULAIR) 10 MG tablet Take 1 tablet (10 mg total) by mouth at bedtime.  . Multiple Vitamin (MULTIVITAMIN) tablet Take 1 tablet by mouth daily.

## 2014-06-06 NOTE — Patient Instructions (Signed)
Take the albuterol nebulizer, then 5-10 minutes later use the mucomyst 10% solution twice a day for two days only (this will smell bad and make you cough up a lot of mucus). Use the flutter valve 4-5 puffs three times per day Call us if you are not better by the end of the week and we will send a prescription for an antibiotic and prednisone See your ENT team again We will see you back in 6 weeks or sooner if needed

## 2014-06-06 NOTE — Telephone Encounter (Signed)
That is OK, just tell him to only use 81mL of it at a time (twice a day for two days)

## 2014-06-06 NOTE — Telephone Encounter (Signed)
Called and spoke with the pts wife and she is aware that [facility] cannot get the mucomyst and this has been sent to [employer] [facility].  Nothing further is needed.

## 2014-06-07 LAB — IGE: IGE (IMMUNOGLOBULIN E), SERUM: 2010 kU/L — AB (ref <115)

## 2014-06-07 LAB — IGG, IGA, IGM
IGA: 173 mg/dL (ref 68–379)
IGG (IMMUNOGLOBIN G), SERUM: 1310 mg/dL (ref 650–1600)
IGM, SERUM: 126 mg/dL (ref 41–251)

## 2014-06-07 MED ORDER — ALBUTEROL SULFATE (2.5 MG/3ML) 0.083% IN NEBU: 2.5000 mg | INHALATION_SOLUTION | Freq: Four times a day (QID) | RESPIRATORY_TRACT | Status: DC

## 2014-06-07 NOTE — Assessment & Plan Note (Addendum)
His moderate persistent asthma is stable right now.  He believes that he is experiencing dizziness related to the Symbicort. He would like to try something else, I have advised Advair 1 puff twice a day. It is very likely that his formulary is going to change in January with his insurance so I have provided samples and he will let us know what is covered by his formulary.

## 2014-06-07 NOTE — Progress Notes (Signed)
Quick Note:  This was reviewed at ov yesterday. ______

## 2014-06-07 NOTE — Progress Notes (Signed)
Quick Note:  lmtcb X1 ______ 

## 2014-06-07 NOTE — Telephone Encounter (Signed)
Called and spoke to pt. Informed pt the albuterol neb should only be used as needed. Rx sent to preferred pharmacy. Pt verbalized understanding and denied any further questions or concerns at this time.

## 2014-06-07 NOTE — Assessment & Plan Note (Addendum)
This is a recurrent problem which puts him at risk for hospitalization.  I reviewed the images from the recent CT chest. He has thick mucus in the bases of his right lung as well as some in the left lung, mostly located in the airways. Today we discussed dust the differential diagnosis of this, initially we were concerned about aspiration but his modified barium swallow was normal. He thinks that his very copious amounts of sinus secretions are contributing to this. Other possibilities are atypical or resistant infections.  His recent sputum culture is negative for bacteria, fungal, and AFB organisms.  Today I recommended a bronchoscopy but he would prefer to forego this for now. In lieu of that decision, I think that he should go back to [place] [facility] to see what else can be done for his sinus disease, and I am going to add aggressive pulmonary toilet to his regimen for now.  Plan: -Add flutter valve -Add Mucomyst 2 days, I advised that this can cause hemoptysis if he uses it more than 2 days -Follow-up with [place] [facility] for sinus symptoms -Obtain serum IgE, Aspergillus specific antigens, and CBC with differential -If no improvement by end of January than we will perform a bronchoscopy

## 2014-06-07 NOTE — Telephone Encounter (Signed)
Write for 2 puffs q6h then, make sure he knows to use it prn

## 2014-06-13 ENCOUNTER — Telehealth: Payer: Self-pay | Admitting: Pulmonary Disease

## 2014-06-13 MED ORDER — PREDNISONE 20 MG PO TABS: 20.0000 mg | ORAL_TABLET | Freq: Every day | ORAL | Status: DC

## 2014-06-13 NOTE — Progress Notes (Signed)
Quick Note:  Called and spoke to pt. Informed pt of the results and recs per BQ. Appt with CY made for 07/28/14 for an allergy consult. Pt verbalized understanding and denied any further questions or concerns at this time. ______

## 2014-06-13 NOTE — Telephone Encounter (Signed)
Notes Recorded by Len Blalock, CMA on 06/07/2014 at 3:35 PM lmtcb X1 Notes Recorded by Juanito Doom, MD on 06/07/2014 at 3:26 PM Caryl Pina,  Please tell him that his labwork was concerning for an allergic reaction to an infection called aspergillus. I want him to have some prednisone 20mg  daily for ten days and then have an allergy test (skin prick testing) with Dr. Annamaria Boots for aspergillus. This would be different than what they tested him for at [facility].  Thanks [employer] and spoke to pt. Informed pt of the results and recs per BQ. Spoke with Katie (CY's nurse) and was informed pt will need allergy consult with CY before skin test and to book first available. Allergy consult with CY is on 07/28/14. Pt aware of app time and date. Rx sent to preferred pharmacy. Pt aware.   BQ please advise if this date is ok for allergy consult.

## 2014-06-16 LAB — AFB CULTURE WITH SMEAR (NOT AT ARMC): Acid Fast Smear: NONE SEEN

## 2014-06-20 NOTE — Telephone Encounter (Signed)
That's OK by me since we had a plan for him to see his ENT in the meantime

## 2014-07-06 ENCOUNTER — Telehealth: Payer: Self-pay | Admitting: Pulmonary Disease

## 2014-07-06 NOTE — Telephone Encounter (Signed)
Called and spoke to pt. Pt stated he has enough mucomyst from last time to use now. Informed pt rx for cough syrup can be picked up tomorrow afternoon so BQ can sign rx. Pt verbalized understanding.  BQ please advise on Guaifenesin-codeine (100-10mg /89ml) amount to be given. Thanks.

## 2014-07-06 NOTE — Telephone Encounter (Signed)
06-06-14 AVS per BQ:  Patient Instructions     Take the albuterol nebulizer, then 5-10 minutes later use the mucomyst 10% solution twice a day for two days only (this will smell bad and make you cough up a lot of mucus). Use the flutter valve 4-5 puffs three times per day Call us if you are not better by the end of the week and we will send a prescription for an antibiotic and prednisone See your ENT team again We will see you back in 6 weeks or sooner if needed   Plan: -Add flutter valve -Add Mucomyst 2 days, I advised that this can cause hemoptysis if he uses it more than 2 days -Follow-up with [place] [facility] for sinus symptoms -Obtain serum IgE, Aspergillus specific antigens, and CBC with differential -If no improvement by end of January than we will perform a bronchoscopy  Pt would like to know if he can go back to using Mucomyst due to increase mucus again.  Also, has cough QHS that lasts about an hour at a time and wants Rx to [facility] for this.   BQ Please advise. Thanks.

## 2014-07-06 NOTE — Telephone Encounter (Signed)
Yes, OK to re-prescribe  OK to send guaifenesin with codeine cough syrup to pharmacy

## 2014-07-07 ENCOUNTER — Other Ambulatory Visit: Payer: Self-pay

## 2014-07-07 MED ORDER — GUAIFENESIN-CODEINE 100-10 MG/5ML PO SOLN
5.0000 mL | Freq: Every evening | ORAL | Status: DC | PRN
Start: 2014-07-07 — End: 2014-07-28

## 2014-07-07 NOTE — Telephone Encounter (Signed)
[employer] and BQ signed. Pt picked this up and nothing further needed

## 2014-07-20 ENCOUNTER — Ambulatory Visit (INDEPENDENT_AMBULATORY_CARE_PROVIDER_SITE_OTHER): Payer: PPO | Admitting: Pulmonary Disease

## 2014-07-20 ENCOUNTER — Encounter: Payer: Self-pay | Admitting: Pulmonary Disease

## 2014-07-20 VITALS — BP 126/68 | HR 90 | Ht 74.0 in | Wt 174.0 lb

## 2014-07-20 DIAGNOSIS — J4540 Moderate persistent asthma, uncomplicated: Secondary | ICD-10-CM

## 2014-07-20 DIAGNOSIS — J3089 Other allergic rhinitis: Secondary | ICD-10-CM

## 2014-07-20 DIAGNOSIS — R9389 Abnormal findings on diagnostic imaging of other specified body structures: Secondary | ICD-10-CM

## 2014-07-20 DIAGNOSIS — R938 Abnormal findings on diagnostic imaging of other specified body structures: Secondary | ICD-10-CM

## 2014-07-20 MED ORDER — BECLOMETHASONE DIPROPIONATE 80 MCG/ACT IN AERS: 4.0000 | INHALATION_SPRAY | Freq: Two times a day (BID) | RESPIRATORY_TRACT | Status: DC

## 2014-07-20 MED ORDER — ALBUTEROL SULFATE (2.5 MG/3ML) 0.083% IN NEBU: 2.5000 mg | INHALATION_SOLUTION | Freq: Four times a day (QID) | RESPIRATORY_TRACT | Status: DC

## 2014-07-20 NOTE — Progress Notes (Signed)
Subjective:    Patient ID: [name], male    DOB: 1940/09/26, 74 y.o.   MRN: [id number]  Synopsis: [name] is a 74 year old male who smoked only in college and was diagnosed with asthma late and adulthood who transitioned his care from the [facility] office under Dr. Joya Gaskins to the [facility] pulmonary office in February 2014. He has simple spirometry consistent with obstruction. He has occasional bronchitis, when doing well no shortness of breath, and a lot of sinus congestion. HPI Chief Complaint  Patient presents with  . Follow-up    pt c/o prod cough with clear/yellow mucus X1.5 months, worse after using his mucomyst.  Also c/o pnd.    [name] said he used mucomyst and brought up a ton of mucus.  He said it was very effective.  He said it was cloudy, sticky mucus like glue.  No blood in it.  He says that he still has a bad cough that has made him more short of breath in the middle of a coughing spell.  He says that his sinus sympotms have decreased some and he has not been bringing out as much sinus mucus.  He saw [facility] and was given an Rx for bactrim. He was found to have MRSA of his sinuses.  Dyspnea has been a bit of a problem for him in the last few weeks. He has not been taking the Symbicort because he said it made him feel like he was going to pass out.  Past Medical History  Diagnosis Date  . Glaucoma   . Hypertension   . AB (asthmatic bronchitis)   . Basal cell cancer   . Cellulitis   . Hx MRSA infection   . Insomnia   . Upper back strain   . Foreign body in skin   . Osteoarthrosis   . Mitral valve disorder   . Arthropathy   . Asthma   . Allergic rhinitis   . Insomnia   . Hx MRSA infection      Review of Systems  Constitutional: Negative for fever, chills and fatigue.  HENT: Positive for congestion. Negative for postnasal drip, rhinorrhea and voice change.   Respiratory: Positive for cough and shortness of breath. Negative for chest tightness and wheezing.    Cardiovascular: Negative for chest pain, palpitations and leg swelling.       Objective:   Physical Exam  Filed Vitals:   07/20/14 0938  BP: 126/68  Pulse: 90  Height: 6\' 2"  (1.88 m)  Weight: 174 lb (78.926 kg)  SpO2: 92%   Room air  Gen: well appearing, no acute distress HEENT: NCAT, EOMi, OP clear,  PULM:  Mild wheezing bilaterally today, good air movement CV: RRR, no mgr, no JVD AB: BS+, nontender,  Ext: warm, no edema, no clubbing, no cyanosis Derm: no rash or skin breakdown   October 2013 simple spirometry ratio 68%, FEV1 2.77 L (63% predicted) flow volume loop consistent with obstruction January 2007 full pulmonary function testing: Ratio 61%, FEV1 3.47 L (104% predicted), total lung capacity 8.7 L (119% predicted, residual volume 3. 04 L (114% predicted), DLCO 28.6 (125% predicted) 10/2012 CXR with ? Hilar mass 10/2012 CT chest >> normal, no mass; air cyst left lung 08/2013 CT chest>> patchy ground glass in bases, non-specific pattern, some focal areas of interlobular septal thickening that tracks with the ggo, cyst left lung base unchanged, no clear emphysema October 2015 chest x-ray > right lower lobe infiltrate 04/2014 CT chest > dependent  lower lobe airspace opacities worrisome for aspiration pneumonia December 2015 modified barium swallow> negative for aspiration      Assessment & Plan:   Extrinsic asthma Taiden has struggled this year with his moderate to severe asthma. Most worrisome has been the elevated IgE and serum eosinophil indicative of an underlying allergy of some sort. I explained to him that this could represent AB PA. Because of that I would like to obtain Aspergillus specific IgE testing today and I would like for him to follow-up with my colleague in the allergy clinic next week for specific focus on mold allergy.  If there is no clear evidence of a mold based on skin testing and serum testing, then the best approach is to either treat him with  prednisone until his IgE level comes down into a safe range and then start Xolair or consider enrollment in a clinical trial for a biologic agent intended to treat severe asthma.  Plan: -Allergy testing next week -Follow-up with me in 4 weeks -Resume inhaled corticosteroid, I have prescribed Qvar as he had nasty side effects from Symbicort   Allergic rhinitis Again, this has been quite a problem for him lately. Complicating factors is apparently an MRSA infection of his sinuses for which she is being treated with Bactrim right now. I explained to him that we need a clear plan from his ear nose and throat physicians in regards for the duration of therapy with Bactrim before considering further rounds of prednisone. He may need a longer course of prednisone if we find evidence of ABPA.   Abnormal CXR A CT chest from November 2015 showed findings worrisome for aspiration pneumonia. Considering the fleeting infiltrates, high serum IgE and eosinophil count I remain concerned about the possibility of a fungal allergic reaction. If we are unable to find any evidence of that then he may yet need a bronchoscopy with BAL to look for an atypical infection of some sort.  Of note, December 2015 modified barium swallow testing was negative.    greater than 25 minutes today were spent in consultation and formulating his plan  Updated Medication List Outpatient Encounter Prescriptions as of 07/20/2014  Medication Sig  . acetylcysteine (MUCOMYST) 10 % nebulizer solution Take 4 mLs by nebulization 2 (two) times daily.  Marland Kitchen albuterol (PROVENTIL) (2.5 MG/3ML) 0.083% nebulizer solution Take 3 mLs (2.5 mg total) by nebulization every 6 (six) hours.  Marland Kitchen aspirin 81 MG tablet Take 81 mg by mouth daily.  Marland Kitchen azelastine (ASTELIN) 0.1 % nasal spray Place 2 sprays into both nostrils 2 (two) times daily. Use in each nostril as directed  . brimonidine (ALPHAGAN) 0.15 % ophthalmic solution Place 1 drop into both eyes 2 (two)  times daily.   . calcium carbonate (OS-CAL) 600 MG TABS Take 600 mg by mouth daily.   . cetirizine (ZYRTEC) 10 MG tablet Take 10 mg by mouth daily.  . fluticasone (FLONASE) 50 MCG/ACT nasal spray Place 1 spray into both nostrils 2 (two) times daily.  Marland Kitchen gentamicin 80 mg in sodium chloride irrigation 0.9 % 250 mL Irrigate with 1 application as directed 2 (two) times daily.  Marland Kitchen guaiFENesin-codeine 100-10 MG/5ML syrup Take 5 mLs by mouth at bedtime as needed for cough.  . latanoprost (XALATAN) 0.005 % ophthalmic solution Place 1 drop into both eyes daily.  . montelukast (SINGULAIR) 10 MG tablet Take 1 tablet (10 mg total) by mouth at bedtime.  . Multiple Vitamin (MULTIVITAMIN) tablet Take 1 tablet by mouth daily.  Marland Kitchen  predniSONE (DELTASONE) 20 MG tablet Take 1 tablet (20 mg total) by mouth daily with breakfast.  . Respiratory Therapy Supplies (FLUTTER) DEVI Use 2-4 times daily  . sulfamethoxazole-trimethoprim (BACTRIM DS,SEPTRA DS) 800-160 MG per tablet Take 1 tablet by mouth 2 (two) times daily.  . [DISCONTINUED] albuterol (PROVENTIL) (2.5 MG/3ML) 0.083% nebulizer solution Take 3 mLs (2.5 mg total) by nebulization every 6 (six) hours.  . [DISCONTINUED] budesonide-formoterol (SYMBICORT) 80-4.5 MCG/ACT inhaler Inhale 2 puffs into the lungs 2 (two) times daily.  . beclomethasone (QVAR) 80 MCG/ACT inhaler Inhale 4 puffs into the lungs 2 (two) times daily.

## 2014-07-20 NOTE — Assessment & Plan Note (Signed)
Again, this has been quite a problem for him lately. Complicating factors is apparently an MRSA infection of his sinuses for which she is being treated with Bactrim right now. I explained to him that we need a clear plan from his ear nose and throat physicians in regards for the duration of therapy with Bactrim before considering further rounds of prednisone. He may need a longer course of prednisone if we find evidence of ABPA.

## 2014-07-20 NOTE — Patient Instructions (Signed)
We will have you get blood work for a mold that may be causing your problems Take the QVar 4 puffs twice a day Keep the appointment with Dr. Annamaria Boots We will see you back in 4 weeks or sooner if needed

## 2014-07-20 NOTE — Assessment & Plan Note (Signed)
[name] has struggled this year with his moderate to severe asthma. Most worrisome has been the elevated IgE and serum eosinophil indicative of an underlying allergy of some sort. I explained to him that this could represent AB PA. Because of that I would like to obtain Aspergillus specific IgE testing today and I would like for him to follow-up with my colleague in the allergy clinic next week for specific focus on mold allergy.  If there is no clear evidence of a mold based on skin testing and serum testing, then the best approach is to either treat him with prednisone until his IgE level comes down into a safe range and then start Xolair or consider enrollment in a clinical trial for a biologic agent intended to treat severe asthma.  Plan: -Allergy testing next week -Follow-up with me in 4 weeks -Resume inhaled corticosteroid, I have prescribed Qvar as he had nasty side effects from Symbicort

## 2014-07-20 NOTE — Assessment & Plan Note (Signed)
A CT chest from November 2015 showed findings worrisome for aspiration pneumonia. Considering the fleeting infiltrates, high serum IgE and eosinophil count I remain concerned about the possibility of a fungal allergic reaction. If we are unable to find any evidence of that then he may yet need a bronchoscopy with BAL to look for an atypical infection of some sort.  Of note, December 2015 modified barium swallow testing was negative.

## 2014-07-28 ENCOUNTER — Ambulatory Visit (INDEPENDENT_AMBULATORY_CARE_PROVIDER_SITE_OTHER): Payer: PPO | Admitting: Internal Medicine

## 2014-07-28 ENCOUNTER — Encounter: Payer: Self-pay | Admitting: Internal Medicine

## 2014-07-28 ENCOUNTER — Ambulatory Visit (INDEPENDENT_AMBULATORY_CARE_PROVIDER_SITE_OTHER): Payer: PPO | Admitting: Pulmonary Disease

## 2014-07-28 ENCOUNTER — Other Ambulatory Visit: Payer: PPO

## 2014-07-28 VITALS — BP 128/64 | HR 78 | Ht 74.0 in | Wt 173.6 lb

## 2014-07-28 DIAGNOSIS — B4481 Allergic bronchopulmonary aspergillosis: Secondary | ICD-10-CM

## 2014-07-28 DIAGNOSIS — R918 Other nonspecific abnormal finding of lung field: Secondary | ICD-10-CM

## 2014-07-28 DIAGNOSIS — J449 Chronic obstructive pulmonary disease, unspecified: Secondary | ICD-10-CM

## 2014-07-28 DIAGNOSIS — J432 Centrilobular emphysema: Secondary | ICD-10-CM

## 2014-07-28 NOTE — Patient Instructions (Signed)
Order lab- Allergy profile, Mold panel IgE, Aspergillus panel                   Office spirometry   Dx Dyspnea on exertion  Aspergillus intradermal skin test and controls (Had Zyrtec approx 18 hours ago)

## 2014-07-28 NOTE — Progress Notes (Signed)
05/28/15- 20 yoM Dr McQuaid-allergic reaction to aspergillus Elevated IgE 2010 Normal Immunoglobulins Negative sputum culture for fungal and AFB CT chest in November showed bibasilar airspace opacities with mucus retention and lower lobe bronchi suspicious for aspiration pneumonia without mass or adenopathy. Easy dyspnea on exertion for the last 6-8 months with near tussive syncope and "cloudy" white sputum. No blood or fever. CBC was significant for 12.6% eosinophils. Total IgE elevated 2010 He denies itching, sneezing, fever or sweats. Denies history of asthma. Nasal surgery for polyps in December 2015. Not aspirin sensitive. He is a retired Chief Financial Officer doing office work with no recognized mold or fungus exposures  Aspergillus Intradermal Skin Test 07/28/2014-negative, with positive histamine control CXR 05/04/15- best seen on lateral IMPRESSION: 1. Persistent opacity posteriorly in the lung base possibly in the right lower lobe worrisome for neoplasm. 2. Emphysema. Electronically Signed  By: Ivar Drape M.D.  On: 05/03/2014 15:57 Office Spirometry 07/28/2014-severe obstructive airways disease with restriction of forced vital capacity-true restriction versus air trapping. FVC 2.07/41%, FEV1 1.31/35%, FEV1/FVC 0.63  Prior to Admission medications   Medication Sig Start Date End Date Taking? Authorizing Provider  acetylcysteine (MUCOMYST) 10 % nebulizer solution Take 4 mLs by nebulization 2 (two) times daily. 06/06/14  Yes Juanito Doom, MD  albuterol (PROVENTIL) (2.5 MG/3ML) 0.083% nebulizer solution Take 3 mLs (2.5 mg total) by nebulization every 6 (six) hours. 07/20/14  Yes Juanito Doom, MD  aspirin 81 MG tablet Take 81 mg by mouth daily.   Yes Historical Provider, MD  azelastine (ASTELIN) 0.1 % nasal spray Place 2 sprays into both nostrils 2 (two) times daily. Use in each nostril as directed   Yes Historical Provider, MD  beclomethasone (QVAR) 80 MCG/ACT inhaler Inhale 4 puffs  into the lungs 2 (two) times daily. 07/20/14  Yes Juanito Doom, MD  brimonidine (ALPHAGAN) 0.15 % ophthalmic solution Place 1 drop into both eyes 2 (two) times daily.    Yes Historical Provider, MD  calcium carbonate (OS-CAL) 600 MG TABS Take 600 mg by mouth daily.    Yes Historical Provider, MD  cetirizine (ZYRTEC) 10 MG tablet Take 10 mg by mouth daily.   Yes Historical Provider, MD  fluticasone (FLONASE) 50 MCG/ACT nasal spray Place 1 spray into both nostrils 2 (two) times daily.   Yes Historical Provider, MD  latanoprost (XALATAN) 0.005 % ophthalmic solution Place 1 drop into both eyes daily.   Yes Historical Provider, MD  montelukast (SINGULAIR) 10 MG tablet Take 1 tablet (10 mg total) by mouth at bedtime. 04/01/14  Yes Juanito Doom, MD  Multiple Vitamin (MULTIVITAMIN) tablet Take 1 tablet by mouth daily.   Yes Historical Provider, MD  Respiratory Therapy Supplies (FLUTTER) DEVI Use 2-4 times daily 06/06/14  Yes Juanito Doom, MD   Past Medical History  Diagnosis Date  . Glaucoma   . Hypertension   . AB (asthmatic bronchitis)   . Basal cell cancer   . Cellulitis   . Hx MRSA infection   . Insomnia   . Upper back strain   . Foreign body in skin   . Osteoarthrosis   . Mitral valve disorder   . Arthropathy   . Asthma   . Allergic rhinitis   . Insomnia   . Hx MRSA infection    Past Surgical History  Procedure Laterality Date  . Appendectomy    . Nasal sinus surgery     Family History  Problem Relation Age of Onset  . Breast cancer  Sister   . Heart failure Mother    ROS-see HPI Constitutional:   No-   weight loss, night sweats, fevers, chills, fatigue, lassitude. HEENT:   No-  headaches, difficulty swallowing, tooth/dental problems, sore throat,       No-  sneezing, itching, ear ache, nasal congestion, post nasal drip,  CV:  No-   chest pain, orthopnea, PND, swelling in lower extremities, anasarca,                                  dizziness, palpitations Resp:  No-   +  productive cough,  No non-productive cough,  No- coughing up of blood.              No-   change in color of mucus.  No- wheezing.   Skin: No-   rash or lesions. GI:  No-   heartburn, indigestion, abdominal pain, nausea, vomiting, diarrhea,                 change in bowel habits, loss of appetite GU: No-   dysuria, change in color of urine, no urgency or frequency.  No- flank pain. MS:  No-   joint pain or swelling.  No- decreased range of motion.  No- back pain. Neuro-     nothing unusual Psych:  No- change in mood or affect. No depression or anxiety.  No memory loss.  OBJ- Physical Exam  tall slender man General- Alert, Oriented, Affect-appropriate, Distress- none acute Skin- rash-none, lesions- none, excoriation- none Lymphadenopathy- none Head- atraumatic            Eyes- Gross vision intact, PERRLA, conjunctivae and secretions clear            Ears- Hearing, canals-normal            Nose- Clear, no-Septal dev, mucus, polyps, erosion, perforation             Throat- Mallampati II , mucosa clear , drainage- none, tonsils- atrophic Neck- flexible , trachea midline, no stridor , thyroid nl, carotid no bruit Chest - symmetrical excursion , unlabored           Heart/CV- RRR , no murmur , no gallop  , no rub, nl s1 s2                           - JVD- none , edema- none, stasis changes- none, varices- none           Lung- trace crackles in bases, wheeze- none, cough- none , dullness-none, rub- none           Chest wall-  Abd- tender-no, distended-no, bowel sounds-present, HSM- no Br/ Gen/ Rectal- Not done, not indicated Extrem- cyanosis- none, clubbing, none, atrophy- none, strength- nl Neuro- grossly intact to observation

## 2014-07-29 LAB — ALLERGY FULL PROFILE
ALLERGEN, D PTERNOYSSINUS, D1: 0.32 kU/L — AB
Aspergillus fumigatus, m3: <0.1 kU/L
Bahia Grass: 0.12 kU/L — ABNORMAL HIGH
Box Elder IgE: <0.1 kU/L
CANDIDA ALBICANS: 0.12 kU/L — AB
CAT DANDER: 0.14 kU/L — AB
Common Ragweed: <0.1 kU/L
D. farinae: 0.26 kU/L — ABNORMAL HIGH
DOG DANDER: 0.18 kU/L — AB
Elm IgE: <0.1 kU/L
Fescue: 0.12 kU/L — ABNORMAL HIGH
G005 Rye, Perennial: <0.1 kU/L
G009 Red Top: <0.1 kU/L
GOOSE FEATHERS: 0.15 kU/L — AB
House Dust Hollister: 0.18 kU/L — ABNORMAL HIGH
IGE (IMMUNOGLOBULIN E), SERUM: 1420 kU/L — AB (ref <115)
LAMB'S QUARTERS CLASS: 0.16 kU/L — AB
Oak: <0.1 kU/L
PLANTAIN: 0.11 kU/L — AB
Sycamore Tree: <0.1 kU/L
Timothy Grass: 0.12 kU/L — ABNORMAL HIGH

## 2014-07-29 LAB — MOLD PROFILE
Alternaria Alternata: <0.1 kU/L
Aspergillus fumigatus, m3: <0.1 kU/L
Cladosporium Herbarum: <0.1 kU/L
PENICILLIUM NOTATUM: 0.11 kU/L — AB

## 2014-07-29 LAB — ALPHA-1-ANTITRYPSIN PHENOTYP: A1 ANTITRYPSIN: 113 mg/dL (ref 90–200)

## 2014-07-30 DIAGNOSIS — B4481 Allergic bronchopulmonary aspergillosis: Secondary | ICD-10-CM | POA: Insufficient documentation

## 2014-07-30 NOTE — Assessment & Plan Note (Addendum)
Pattern suggestive of an AB PA type of sensitization to fungal airway flora. Retained mucus in dependent airways is suggestive although radiologist suggested aspiration and that is not excluded. This may be reaction to another fungus, rather than Aspergillus, but sputum culture so far negative.  Plan-allergy profile, aspergillus panel, mold profile

## 2014-07-30 NOTE — Assessment & Plan Note (Signed)
Plan-office spirometry done, showing more severe obstructive airways disease than previously documented. Schedule 6 minute walk test because of complaint of dyspnea with exertion.

## 2014-08-02 ENCOUNTER — Telehealth: Payer: Self-pay | Admitting: *Deleted

## 2014-08-02 LAB — ASPERGILLUS IGE PANEL
Aspergillus Amstel/glauc IgE: <0.35 kU/L (ref <0.35)
Aspergillus IgE Panel: <0.1 kU/L (ref <0.35)
Aspergillus Terreus IgE: <0.35 kU/L (ref <0.35)
Aspergillus flavus IgE: <0.35 kU/L (ref <0.35)
CLASS: 0
CLASS: 0
Class: 0
Class: 0
Class: 0
Class: 0
Results Received-AspIgE: 0.12 kU/L (ref <0.35)

## 2014-08-02 NOTE — Telephone Encounter (Signed)
Spoke with patient about his allergy profile results.  Patient wants to know if there is a treatment for his allergies?  Please advise.

## 2014-08-03 ENCOUNTER — Telehealth: Payer: Self-pay | Admitting: Emergency Medicine

## 2014-08-03 ENCOUNTER — Inpatient Hospital Stay: Payer: Self-pay | Admitting: Internal Medicine

## 2014-08-03 NOTE — Telephone Encounter (Signed)
The mold allergy studies don't show evidence of the kind of allergic lung disease Dr Lake Bells was looking for. The environmental allergy pattern - increased antibodies against common pollens, animal danders, house dust- would not cause the xray findings, so he needs to talk with Dr Lake Bells about his lungs.  The allergy profile would fit with allergic nasal problems and wheezing. If those are enough of a problem, and don't respond well enough to antihistamines, nasal sprays and asthma medicines, the we could look at allergy shots or Xolair shots.

## 2014-08-03 NOTE — Telephone Encounter (Signed)
lmtcb

## 2014-08-03 NOTE — Telephone Encounter (Signed)
Called by the pt's daughter and notified that he is being admitted to [facility] right now. Reported malaise, weakness, shakiness, hypoxemia. She requests that we round on him, participate in his care to insure that the [facility] MD's know his outpt course. I told her that I do not believe we can round on him tonight but we may be able to facilitate during the day. We will certainly reach out the the care team at [facility], review chart and plans with them I will speak w Dr Lake Bells and make a plan.  Spoke with Dr Lake Bells - very difficult case with progressive asthma that appears to have allergic component. May need biologic therapy, ? xolair an option. Dr Lake Bells will call Dr Stevenson Clinch, work to see the pt on 2/18, help formulate a plan.

## 2014-08-04 DIAGNOSIS — R0602 Shortness of breath: Secondary | ICD-10-CM

## 2014-08-05 NOTE — Telephone Encounter (Signed)
lmtcb

## 2014-08-08 ENCOUNTER — Telehealth: Payer: Self-pay | Admitting: Pulmonary Disease

## 2014-08-08 NOTE — Telephone Encounter (Signed)
Called and spoke to pt. Pt stated he thought he had two f/u appts for HFU and was needing to cancel one if it is unnecessary. Informed pt he only has one f/u with BQ on 3/23. Pt verbalized understanding and denied any further questions or concerns at this time.

## 2014-08-08 NOTE — Telephone Encounter (Signed)
[phone], pt cb

## 2014-08-08 NOTE — Telephone Encounter (Signed)
Called and spoke to pt. Informed pt of the results and recs per CY. Pt verbalized understanding and denied any further questions or concerns at this time.   

## 2014-08-09 ENCOUNTER — Telehealth: Payer: Self-pay | Admitting: Pulmonary Disease

## 2014-08-09 NOTE — Telephone Encounter (Signed)
Pt was seen in the hospital and was started on some new medications. Has OV with BQ on 09/07/14, would like this moved up. ROV has been moved up with TP on 08/22/14. Nothing further was needed.

## 2014-08-18 LAB — FUNGUS CULTURE W SMEAR: Smear Result: NONE SEEN

## 2014-08-22 ENCOUNTER — Ambulatory Visit (INDEPENDENT_AMBULATORY_CARE_PROVIDER_SITE_OTHER): Payer: PPO | Admitting: Adult Health

## 2014-08-22 ENCOUNTER — Other Ambulatory Visit: Payer: PPO

## 2014-08-22 ENCOUNTER — Ambulatory Visit (INDEPENDENT_AMBULATORY_CARE_PROVIDER_SITE_OTHER)
Admission: RE | Admit: 2014-08-22 | Discharge: 2014-08-22 | Disposition: A | Discharge status: Home / Self Care | Payer: PPO | Source: Ambulatory Visit | Attending: Adult Health | Admitting: Adult Health

## 2014-08-22 ENCOUNTER — Encounter: Payer: Self-pay | Admitting: Adult Health

## 2014-08-22 VITALS — BP 126/74 | HR 70 | Temp 98.2°F | Ht 74.0 in | Wt 173.2 lb

## 2014-08-22 DIAGNOSIS — J189 Pneumonia, unspecified organism: Secondary | ICD-10-CM

## 2014-08-22 DIAGNOSIS — J449 Chronic obstructive pulmonary disease, unspecified: Secondary | ICD-10-CM

## 2014-08-22 DIAGNOSIS — J3089 Other allergic rhinitis: Secondary | ICD-10-CM

## 2014-08-22 NOTE — Patient Instructions (Signed)
Add Mucinex DM Twice daily  As needed  Cough/congestion  Continue on current regimen .  Follow up Dr. Lake Bells in 6 weeks and As needed

## 2014-08-22 NOTE — Progress Notes (Signed)
Subjective:    Patient ID: [name], male    DOB: 1941/05/18, 74 y.o.   MRN: [id number]  Synopsis: [name] is a 74 year old male who smoked only in college With mod to severe  asthma late in adulthood who transitioned his care from the [facility] office under Dr. Joya Gaskins to the [facility] pulmonary office in February 2014. He has simple spirometry consistent with obstruction. He has occasional bronchitis, when doing well no shortness of breath, and a lot of sinus congestion.   He saw [facility] and was given an Rx for bactrim. He was found to have MRSA of his sinuses.     Workup :  October 2013 simple spirometry ratio 68%, FEV1 2.77 L (63% predicted) flow volume loop consistent with obstruction January 2007 full pulmonary function testing: Ratio 61%, FEV1 3.47 L (104% predicted), total lung capacity 8.7 L (119% predicted, residual volume 3. 04 L (114% predicted), DLCO 28.6 (125% predicted) 10/2012 CXR with ? Hilar mass 10/2012 CT chest >> normal, no mass; air cyst left lung 08/2013 CT chest>> patchy ground glass in bases, non-specific pattern, some focal areas of interlobular septal thickening that tracks with the ggo, cyst left lung base unchanged, no clear emphysema October 2015 chest x-ray > right lower lobe infiltrate 04/2014 CT chest > dependent lower lobe airspace opacities worrisome for aspiration pneumonia December 2015 modified barium swallow> negative for aspiration 05/2014/ 07/28/14 >high IgE 1420-2010 , elevated serum eosinophils , rast +dust/animal/grass pollen 07/30/14 Allergy consult >suggestive of ABPA but neg aspergillus ab panel   08/22/2014 [facility] follow up  Pt returns for a post hospital follow up  Admitted recently to [facility] for PNA and sinusitis  CT chest showed bibasilar consolidation. CT sinus right max. Sinusitis  Tx with abx and steroids.  Says he is feeling some better but cough with rattling congestion persists.  Still weak. Prior to admit was seen by  allergist for possible ABPA.  Workup showed nml alpha 1 , neg Aspergillus ab panel.  Allergy profile with multiple positive results for dust , animal and grass pollen allergens. With high Ige Pt has been referred to ENT at [facility] next week.  He remains on QVAR 4 puffs Twice daily  , singulair , nasal regimen astelin/flonase, flutter, mucomyst/albuterol neb. We discussed adding mucinex and saline nasal .  CXR today shows no acute process.     ROS Constitutional:   No  weight loss, night sweats,  Fevers, chills +, fatigue, or  lassitude.  HEENT:   No headaches,  Difficulty swallowing,  Tooth/dental problems, or  Sore throat,                No sneezing, itching, ear ache, + nasal congestion, post nasal drip,   CV:  No chest pain,  Orthopnea, PND, swelling in lower extremities, anasarca, dizziness, palpitations, syncope.   GI  No heartburn, indigestion, abdominal pain, nausea, vomiting, diarrhea, change in bowel habits, loss of appetite, bloody stools.   Resp:    No chest wall deformity  Skin: no rash or lesions.  GU: no dysuria, change in color of urine, no urgency or frequency.  No flank pain, no hematuria   MS:  No joint pain or swelling.  No decreased range of motion.  No back pain.  Psych:  No change in mood or affect. No depression or anxiety.  No memory loss.           Objective:   Physical Exam  GEN: A/Ox3; pleasant ,  NAD, elderly   HEENT:  [place]/AT,  EACs-clear, TMs-wnl, NOSE-clear drainage THROAT-clear, no lesions, no postnasal drip or exudate noted.   NECK:  Supple w/ fair ROM; no JVD; normal carotid impulses w/o bruits; no thyromegaly or nodules palpated; no lymphadenopathy.  RESP  Coarse BS  w/o, wheezes/ rales/ or rhonchi.no accessory muscle use, no dullness to percussion  CARD:  RRR, no m/r/g  , no peripheral edema, pulses intact, no cyanosis or clubbing.  GI:   Soft & nt; nml bowel sounds; no organomegaly or masses detected.  Musco: Warm bil, no  deformities or joint swelling noted.   Neuro: alert, no focal deficits noted.    Skin: Warm, no lesions or rashes    CXR 3/7 NAD  Reviewed indepedently.      Assessment & Plan:

## 2014-08-23 NOTE — Assessment & Plan Note (Signed)
Recent admission with CAP and Sinusitis -slowly resolving  cXR shows clearance .  Pt on maximal asthma/allergy treatements.  Cont on current regimen  Add mucinex

## 2014-08-23 NOTE — Assessment & Plan Note (Signed)
Difficult to control with maximal meds , High IgE and eosinophils suggestive of ABPA  Allergy consult w/ aspergillus ab panel neg.  Will cont on currrent regimen follow up with Dr. Lake Bells in 6 weeks and allergy as planned

## 2014-08-23 NOTE — Assessment & Plan Note (Signed)
Cont on current regimen  Saline nasal rinses  follow up with ENT at [facility] next week as planed for new consult

## 2014-08-24 NOTE — Progress Notes (Signed)
Quick Note:  Called spoke with patient, advised of cxr results / recs as stated by TP. Pt verbalized his understanding and denied any questions. ______ 

## 2014-08-25 NOTE — Progress Notes (Signed)
I agree with above 

## 2014-09-02 LAB — AFB CULTURE WITH SMEAR (NOT AT ARMC): ACID FAST SMEAR: NONE SEEN

## 2014-09-06 ENCOUNTER — Ambulatory Visit: Payer: PPO | Admitting: Pulmonary Disease

## 2014-09-07 ENCOUNTER — Inpatient Hospital Stay: Payer: PPO | Admitting: Pulmonary Disease

## 2014-09-27 HISTORY — PX: NASAL SINUS SURGERY: SHX719

## 2014-09-30 ENCOUNTER — Encounter: Payer: Self-pay | Admitting: Pulmonary Disease

## 2014-10-05 ENCOUNTER — Encounter: Payer: Self-pay | Admitting: Pulmonary Disease

## 2014-10-05 ENCOUNTER — Ambulatory Visit (INDEPENDENT_AMBULATORY_CARE_PROVIDER_SITE_OTHER): Payer: PPO | Admitting: Pulmonary Disease

## 2014-10-05 VITALS — BP 110/60 | HR 84 | Ht 74.0 in | Wt 173.0 lb

## 2014-10-05 DIAGNOSIS — B4481 Allergic bronchopulmonary aspergillosis: Secondary | ICD-10-CM

## 2014-10-05 DIAGNOSIS — J449 Chronic obstructive pulmonary disease, unspecified: Secondary | ICD-10-CM | POA: Diagnosis not present

## 2014-10-05 MED ORDER — PREDNISONE 20 MG PO TABS: 20.0000 mg | ORAL_TABLET | Freq: Every day | ORAL | Status: DC

## 2014-10-05 NOTE — Assessment & Plan Note (Addendum)
CT chest reviewed from 07/2014 showing mucus plugging and infiltrates in the bases, improved on CXR 08/2014 Notes from [facility] reviewed and allergy reviewed, sinus surgery last week, no fungal organism identified  See details of care plan above  I believe his sinus symptoms and asthma symptoms are all a spectrum of the same allergic reaction to a mold. After treating with prednisone I would like to treat with Xolair once his IgE level has decreased into a safe range. At this point if we started his risk of serum sickness would be too high.

## 2014-10-05 NOTE — Assessment & Plan Note (Signed)
[name] has had severe recurrent asthma with worsening in his spirometry this year as well as an admission for community-acquired pneumonia which I believe is directly related to all the mucus plugging he has experienced from an allergic bronchopulmonary aspergillosis type syndrome. His blood testing was not positive for aspergillus and so it may be that there is an and alternative mold which is causing this scenario. However, his high serum eosinophil and high serum IgE levels, and mucus plugging on CT scan and abrupt change in his symptoms in the last 12 months are all strongly suggestive of this.  Plan: -See infectious diseases next week -If infectious diseases feels that there is not an invasive fungal infection in my recommendation is to start prednisone 20 mg daily for 3 months followed by 10 mg daily for 3 months. We discussed the risks and benefits of this and all the side effects associated with prednisone. If he has to many side effects than we can try to shorten the course but typically 6 months as needed for proper efficacy. -I will leave it up to infectious diseases to determine if he needs an antifungal therapy like itraconazole, the American thoracic Society does recommended in the setting of diagnosed ABPA but unfortunately in this situation we do not have a fungal organism identified -continue current inhaled therapy and mucociliary clearance measures -f/u 2 months

## 2014-10-05 NOTE — Patient Instructions (Signed)
Continue taking your albuterol 2-3 times a day to assist with mucociliary clearance Continue Qvar as you're doing Continue Zyrtec After you have seen the infectious diseases physician start taking prednisone 20 mg daily plan for this for 3 months and then decrease the dose for another 3 months Follow-up with [place] in 2 months or sooner if needed

## 2014-10-05 NOTE — Progress Notes (Signed)
Subjective:    Patient ID: [name], male    DOB: 1940/08/23, 74 y.o.   MRN: [id number]  Synopsis: [name] is a 74 year old male who smoked only in college and was diagnosed with asthma late and adulthood who transitioned his care from the [facility] office under Dr. Joya Gaskins to the [facility] pulmonary office in February 2014. He has simple spirometry consistent with obstruction. He has occasional bronchitis, when doing well no shortness of breath, and a lot of sinus congestion. HPI Chief Complaint  Patient presents with  . Follow-up    PNA & asthma; SOB w/activity; over all feels well; had sinus surgery on the 09-27-14   [name] had surgery at [facility] for sinus surgery last week.  He said that he was told he had more "pus than he had ever seen" in his sinuses.  His surgery went well.  HE says that he is now breathing better than he ever has. He's not aware if they did cultures.  His breathing has been doing well and he is "drownign in air", but he still is coughing up mucus from time to time.  Othwerwise he is OK.  Past Medical History  Diagnosis Date  . Glaucoma   . Hypertension   . AB (asthmatic bronchitis)   . Basal cell cancer   . Cellulitis   . Hx MRSA infection   . Insomnia   . Upper back strain   . Foreign body in skin   . Osteoarthrosis   . Mitral valve disorder   . Arthropathy   . Asthma   . Allergic rhinitis   . Insomnia   . Hx MRSA infection      Review of Systems  Constitutional: Negative for fever, chills and fatigue.  HENT: Negative for congestion, postnasal drip, rhinorrhea and voice change.   Respiratory: Positive for cough. Negative for chest tightness, shortness of breath and wheezing.   Cardiovascular: Negative for chest pain, palpitations and leg swelling.       Objective:   Physical Exam  Filed Vitals:   10/05/14 0850  BP: 110/60  Pulse: 84  Height: 6\' 2"  (1.88 m)  Weight: 173 lb (78.472 kg)  SpO2: 96%   Room air  Gen: well appearing,  no acute distress HEENT: NCAT, EOMi, OP clear,  PULM:  CTA B Today CV: RRR, no mgr, no JVD GI: BS+, nontender,  MSK: normal bulk and tone Derm: no rash or skin breakdown Psyche: normal mood and affect  October 2013 simple spirometry ratio 68%, FEV1 2.77 L (63% predicted) flow volume loop consistent with obstruction January 2007 full pulmonary function testing: Ratio 61%, FEV1 3.47 L (104% predicted), total lung capacity 8.7 L (119% predicted, residual volume 3. 04 L (114% predicted), DLCO 28.6 (125% predicted) 10/2012 CXR with ? Hilar mass 10/2012 CT chest >> normal, no mass; air cyst left lung 08/2013 CT chest>> patchy ground glass in bases, non-specific pattern, some focal areas of interlobular septal thickening that tracks with the ggo, cyst left lung base unchanged, no clear emphysema October 2015 chest x-ray > right lower lobe infiltrate 04/2014 CT chest > dependent lower lobe airspace opacities worrisome for aspiration pneumonia December 2015 modified barium swallow> negative for aspiration  February 2016 CT chest from hospitalization as well as the March 2016 chest x-ray images reviewed> on the CT chest there is mucus plugging and bilateral airspace disease in the bases consistent with community acquired pneumonia, this is improved on the chest x-ray from March  Records from [facility] were reviewed today in clinic where he had sinus surgery last week     Assessment & Plan:   Asthma, chronic obstructive, without status asthmaticus Corgan has had severe recurrent asthma with worsening in his spirometry this year as well as an admission for community-acquired pneumonia which I believe is directly related to all the mucus plugging he has experienced from an allergic bronchopulmonary aspergillosis type syndrome. His blood testing was not positive for aspergillus and so it may be that there is an and alternative mold which is causing this scenario. However, his high serum eosinophil and  high serum IgE levels, and mucus plugging on CT scan and abrupt change in his symptoms in the last 12 months are all strongly suggestive of this.  Plan: -See infectious diseases next week -If infectious diseases feels that there is not an invasive fungal infection in my recommendation is to start prednisone 20 mg daily for 3 months followed by 10 mg daily for 3 months. We discussed the risks and benefits of this and all the side effects associated with prednisone. If he has to many side effects than we can try to shorten the course but typically 6 months as needed for proper efficacy. -I will leave it up to infectious diseases to determine if he needs an antifungal therapy like itraconazole, the American thoracic Society does recommended in the setting of diagnosed ABPA but unfortunately in this situation we do not have a fungal organism identified -continue current inhaled therapy and mucociliary clearance measures -f/u 2 months   Pulmonary aspergillosis, allergic bronchopulmonary type CT chest reviewed from 07/2014 showing mucus plugging and infiltrates in the bases, improved on CXR 08/2014 Notes from [facility] ENT reviewed and allergy reviewed, sinus surgery last week, no fungal organism identified  See details of care plan above  I believe his sinus symptoms and asthma symptoms are all a spectrum of the same allergic reaction to a mold. After treating with prednisone I would like to treat with Xolair once his IgE level has decreased into a safe range. At this point if we started his risk of serum sickness would be too high.    greater than 25 minutes today were spent in consultation and formulating his plan  Updated Medication List Outpatient Encounter Prescriptions as of 10/05/2014  Medication Sig  . albuterol (PROVENTIL) (2.5 MG/3ML) 0.083% nebulizer solution Take 3 mLs (2.5 mg total) by nebulization every 6 (six) hours.  Marland Kitchen aspirin 81 MG tablet Take 81 mg by mouth daily.  Marland Kitchen azelastine  (ASTELIN) 0.1 % nasal spray Place 2 sprays into both nostrils 2 (two) times daily. Use in each nostril as directed  . beclomethasone (QVAR) 80 MCG/ACT inhaler Inhale 4 puffs into the lungs 2 (two) times daily.  . brimonidine (ALPHAGAN) 0.15 % ophthalmic solution Place 1 drop into both eyes 2 (two) times daily.   . calcium carbonate (OS-CAL) 600 MG TABS Take 600 mg by mouth daily.   . cetirizine (ZYRTEC) 10 MG tablet Take 10 mg by mouth daily.  . fluticasone (FLONASE) 50 MCG/ACT nasal spray Place 1 spray into both nostrils 2 (two) times daily.  Marland Kitchen latanoprost (XALATAN) 0.005 % ophthalmic solution Place 1 drop into both eyes daily.  . Multiple Vitamin (MULTIVITAMIN) tablet Take 1 tablet by mouth daily.  Marland Kitchen Respiratory Therapy Supplies (FLUTTER) DEVI Use 2-4 times daily  . predniSONE (DELTASONE) 20 MG tablet Take 1 tablet (20 mg total) by mouth daily with breakfast.  . [DISCONTINUED] acetylcysteine (  MUCOMYST) 10 % nebulizer solution Take 4 mLs by nebulization 2 (two) times daily. (Patient not taking: Reported on 10/05/2014)  . [DISCONTINUED] montelukast (SINGULAIR) 10 MG tablet Take 1 tablet (10 mg total) by mouth at bedtime. (Patient not taking: Reported on 10/05/2014)

## 2014-10-08 NOTE — H&P (Signed)
PATIENT NAME:  [name], [name] MR#:  [id number] DATE OF BIRTH:  May 06, 1941  DATE OF ADMISSION:  08/22/2013  REFERRING PHYSICIAN: Sheryl L. Benjaman Lobe, MD  FAMILY PHYSICIAN: Jerrell Belfast, MD  REASON FOR ADMISSION: Shortness of breath with hypoxia.   HISTORY OF PRESENT ILLNESS: The patient is a 74 year old male with a history of chronic sinus issues, environmental allergies and COPD. Denies history of tobacco abuse. Has been evaluated by Dr. Lake Bells of [facility] pulmonology in the past. Presents to the Emergency Room with progressive shortness of breath. In the Emergency Room, the patient was found to be hypoxic. He was given SVNs x 3 with IV Solu-Medrol with no improvement. He remained hypoxic, especially with ambulation. He is now admitted for further evaluation.   PAST MEDICAL HISTORY: 1.  COPD/asthma.  2.  Glaucoma.  3.  Environmental allergies.  4.  Previous sinus surgery. Status. 5.  Post appendectomy.   MEDICATIONS ON ADMISSION:  1.  Multivitamin 1 p.o. daily.  2.  Latanoprost eye drops as directed.  3.  Brimonidine eye drops as directed.  4.  Aspirin 81 mg p.o. daily.  5.  Asmanex 1 puff at bedtime.   ALLERGIES: No known drug allergies.   SOCIAL HISTORY: Negative for alcohol or tobacco abuse.   FAMILY HISTORY: Negative for colon or prostate cancer. Negative for coronary artery disease. Noncontributory.   REVIEW OF SYSTEMS: CONSTITUTIONAL: No fever or change in weight.  EYES: No blurred or double vision. Denies glaucoma. ENT: No tinnitus or hearing loss. No nasal discharge or bleeding. No difficulty swallowing.  RESPIRATORY: The patient has had cough and wheezing. No hemoptysis. No painful respiration.  CARDIOVASCULAR: No chest pain or orthopnea. No palpitations.  GASTROINTESTINAL: No nausea, vomiting or diarrhea. No abdominal pain. No change in bowel habits.  GENITOURINARY: No dysuria or hematuria. No incontinence.  ENDOCRINE: No polyuria or polydipsia. No heat or cold  intolerance.  HEMATOLOGIC: The patient denies anemia, easy bruising or bleeding.  LYMPHATIC: No swollen glands.  MUSCULOSKELETAL: The patient has pain in his neck, back, shoulders, knees, hips. No gout.  NEUROLOGIC: No numbness or migraines. Denies stroke or seizures.  PSYCHIATRIC: The patient denies anxiety, insomnia or depression.   PHYSICAL EXAMINATION: GENERAL: The patient is in no acute distress. Affect is somewhat flattened.  VITAL SIGNS: Remarkable for a blood pressure of 129/75, heart rate of 104, respiratory rate of 18, temperature of 98.2, sat of 88% on room air.  HEENT: Normocephalic, atraumatic. Pupils equally round and reactive to light and accommodation. Extraocular movements are intact. Sclerae are anicteric. Conjunctivae clear. Oropharynx clear.   NECK: Supple without JVD. No adenopathy or thyromegaly is noted.  LUNGS: Revealed scattered wheezes and rhonchi without rales. No dullness. Respiratory effort is mildly increased.  CARDIAC: Regular rate and rhythm. Normal S1, S2. No significant rubs, murmurs or gallops. PMI is nondisplaced. Chest wall is nontender.  ABDOMEN: Soft, nontender, with normoactive bowel sounds. No organomegaly or masses were appreciated. No hernias or bruits were noted.  EXTREMITIES: Without clubbing, cyanosis or edema. Pulses were 2+ bilaterally.  SKIN: Warm and dry without rash or lesions.  NEUROLOGIC: Cranial nerves II through XII grossly intact. Deep tendon reflexes were symmetric. Motor and sensory exam is nonfocal.  PSYCHIATRIC: Revealed a patient who was alert and oriented to person, place and time. He was cooperative and used good judgment.   LABORATORY, DIAGNOSTIC AND RADIOLOGICAL DATA:  EKG revealed sinus rhythm with no acute ischemic changes. Chest x-ray revealed COPD without pulmonary edema or  infiltrate. White count was 9.2 with a hemoglobin of 17.0. Chemistries revealed a glucose of 90 with a BUN of 10, creatinine 1.03 and GFR of greater than 60.  BNP was 102 and his troponin was less than 0.02.   ASSESSMENT: 1.  Persistent hypoxia with respiratory insufficiency.  2.  Chronic obstructive pulmonary disease exacerbation.  3.  Depression.  4.  Environmental allergies.  5.  Glaucoma.   PLAN: The patient will be observed on the floor with oxygen, IV steroids, IV antibiotics and DuoNeb SVNs. We will obtain an echocardiogram and a chest CT to further evaluate his shortness of breath and hypoxia. Will also send off an alpha-1 antitrypsin deficiency lab.  We will continue his glaucoma drops. Follow up routine labs in the morning. Wean oxygen as tolerated. We will obtain a pulmonary consult. Further treatment and evaluation will depend upon the patient's progress.   TOTAL TIME SPENT ON THIS PATIENT: 50 minutes.    ____________________________ Leonie Douglas Doy Hutching, MD jds:cs D: 08/22/2013 12:33:32 ET T: 08/22/2013 14:21:25 ET JOB#: [id number]  cc: Leonie Douglas. Doy Hutching, MD, <Dictator> Jerrell Belfast, MD Juliauna Stueve Lennice Sites MD ELECTRONICALLY SIGNED 08/22/2013 16:59

## 2014-10-08 NOTE — Discharge Summary (Signed)
PATIENT NAME:  [name], [name] MR#:  [id number] DATE OF BIRTH:  07-05-1940  DATE OF ADMISSION:  04/07/2014 DATE OF DISCHARGE:  04/09/2014  PRESENTING COMPLAINT: Shortness of breath and fever with cough, congestion.   DISCHARGE DIAGNOSES:  1. Right lower lobe pneumonia.  2. Saturation 92% on room air.   CODE STATUS: Full code.   MEDICATIONS:  1. Cefuroxime 500 mg b.i.d. with azithromycin.  2. Azithromycin 250 p.o. daily.  3. Aspirin 81 mg daily.  4. Latanoprost 0.005, one drop affected eye at bedtime.  5. Brimonidine 1 drop to affected eye b.i.d.  6. Calcium with vitamin D 600 b.i.d.  7. Multivitamin daily.  8. Singulair 10 mg daily.  9. Symbicort 2 puffs b.i.d.  10. Tamiflu 75 mg b.i.d., complete your course.  11. Ventolin HFA 2 puffs 4 times a day.   Follow up with Dr. Venia Minks in 1 to 2 weeks.   LABORATORY DATA: White count at discharge is 10.1. Chemistry is within normal limits. Sputum culture heavy growth of strep pneumo. Blood cultures negative in 48 hours. White count on admission was 18.2. Chest x-ray infiltrate right lower lobe pneumonia. Creatinine is 1.18. Urinalysis negative for urinary tract infection. Blood cultures negative 48 hours.   BRIEF SUMMARY OF HOSPITAL COURSE: [name] is pleasant 74 year old Caucasian gentleman with history of asthma and glaucoma, comes in with:  1. Clinical sepsis with pneumonia and presented with fever, tachycardia, tachypnea, started on IV Rocephin and Zithromax, changed to p.o. Zithromax and cefuroxime. Sputum cultures were positive for strep pneumo. He will finish up his Tamiflu that was started from his primary care physician's office. Influenza test in PCPs office was negative. The patient's white count remained normal. He remained afebrile and was discharged in a stable condition.  2. Asthma. Received IV Solu-Medrol, normal wheezing, discontinued it and continue Symbicort and Nebs.  3. Glaucoma. Continue latanoprost and brimonidine overall  much feeling better. Discharge to  home in stable condition. The patient remained a full code.   TIME SPENT: 40 minutes.    ____________________________ Hart Rochester Posey Pronto, MD sap:JT D: 04/12/2014 13:22:19 ET T: 04/12/2014 22:42:51 ET JOB#: [id number]  cc: Aaryan Essman A. Posey Pronto, MD, <Dictator> Ilda Basset MD ELECTRONICALLY SIGNED 04/28/2014 7:42

## 2014-10-08 NOTE — Discharge Summary (Signed)
PATIENT NAME:  [name], [name] MR#:  [id number] DATE OF BIRTH:  September 02, 1940  DATE OF ADMISSION:  08/22/2013 DATE OF DISCHARGE:  08/23/2013  ADMISSION DIAGNOSIS: Acute respiratory failure.   DISCHARGE DIAGNOSES:  1.  Acute respiratory failure from acute bronchitis with reactive airway disease, possible chronic obstructive pulmonary disease.  2.  Reactive airway disease with leukocytosis.  3.  Glaucoma.   CONSULTATIONS: None.   PERTINENT LABORATORIES AT DISCHARGE: White blood cells 20.8, hemoglobin 13.7, hematocrit 43, platelets 176. Sodium 136, potassium 4.0, chloride 105, bicarbonate 24, BUN 19, creatinine 1.23, glucose 149. A 2D echocardiogram showed an ejection fraction of 60% to 65% without any valvular abnormalities. CT of the chest showed no pulmonary edema. It did show atelectasis.   HOSPITAL COURSE: This is a 74 year old male, who presented with respiratory distress. For further details, please refer to the H and P 1.  Acute respiratory failure. The patient says he does not have a diagnosis of COPD. He was seen by a pulmonologist for the past 10 years at [facility] and never diagnosed with COPD. His chest x-ray did show COPD/emphysema; however, we will go head and just have a discharge diagnosis of reactive airway disease. He was placed on oxygen, steroids and antibiotics. He actually did really well with these and a CT of the chest did not show any evidence of a pulmonary emboli. It did show some atelectasis. He is not hypoxic. He is not wheezing. On exam at discharge, actually his lungs are clear to auscultation.  2.  Reactive airway disease with acute bronchitis. The patient will be on azithromycin and a quick steroid taper.  3.  Leukocytosis, which is from steroids.  4.  Glaucoma. The patient will continue with his eye drops.   DISCHARGE MEDICATIONS:  1.  Azithromycin 500 mg p.o. daily for 3 days.  2.  Prednisone taper starting at 50 mg, taper x 10 mg every day.  3.  Calcium, vitamin D 1  tablet daily.  4.  Asmanex 1 puff at bedtime.  5.  Brimonidine ophthalmic b.i.d. 6.  Latanoprost 1 drop at bedtime.  7.  Aspirin 81 mg daily.   DISCHARGE DIET: Regular.   DISCHARGE ACTIVITY: As tolerated.   DISCHARGE FOLLOWUP: The patient will follow up with Dr. Venia Minks next week.   TIME SPENT: 35 minutes. The patient is stable for discharge and education.  ____________________________ Donell Beers. Benjie Karvonen, MD spm:aw D: 08/23/2013 14:01:19 ET T: 08/23/2013 14:15:48 ET JOB#: [id number]  cc: Roquel Burgin P. Benjie Karvonen, MD, <Dictator> Jerrell Belfast, MD Donell Beers Lucile Didonato MD ELECTRONICALLY SIGNED 08/24/2013 8:36

## 2014-10-08 NOTE — H&P (Signed)
PATIENT NAME:  [name], BAISLEY MR#:  [id number] DATE OF BIRTH:  05/25/1941  DATE OF ADMISSION:  04/07/2014  PRIMARY CARE PHYSICIAN: Jerrell Belfast, MD  CHIEF COMPLAINT: Shaking chills and cough,   HISTORY OF PRESENT ILLNESS: A 74 year old man who had shaking chills on Tuesday, went over to his primary care physician's office to get his flu shot. They thought he had the flu and did not give him the flu shot, but also gave him Tamiflu to go home with. He had shaking chills today and yesterday, temperature of 103 today, coughing. He checked his pulse oximetry at home, it was 88%. He has shortness of breath with walking, headache. He is bringing up some milky-like phlegm. At night he hears rattling in the chest. In the ER, he was found to have a pneumonia on chest x-ray. He was also found to be tachypneic, tachycardic, and a temperature. Hospitalist services were contacted for further evaluation.   PAST MEDICAL HISTORY: Asthma, glaucoma.   PAST SURGICAL HISTORY: Sinus surgery, polyps, submucosal resection, appendicitis.   ALLERGIES: No known drug allergies.   MEDICATIONS: Include Tamiflu 75 mg twice a day; Symbicort 84.5, 2 puffs twice a day; Equate nasal spray daily; Ventolin inhaler p.r.n.; latanoprost eye drops 1 drop at bedtime, both eyes; brimonidine tartrate 0.2%, 1 drop both eyes twice a day; Zyrtec 10 mg daily; Singulair 10 mg daily; an allergy drop that he takes.   SOCIAL HISTORY: No smoking. Wine 3-4 times per week. No drug use. He is a retired Building control surveyor.   FAMILY HISTORY: Father died at 26 of Alzheimer's. Mother died at 47 of congestive heart failure.  REVIEW OF SYSTEMS:  CONSTITUTIONAL: Positive for fever, chills, sweats. Positive for weakness. No weight loss. No weight gain.  EYES: He does wear glasses. EARS, NOSE, MOUTH, THROAT: Positive for runny nose. No sore throat. No difficulty swallowing.  CARDIOVASCULAR: No chest pain. No palpitations.  RESPIRATORY: Positive  for shortness of breath with walking. Positive for cough, milky phlegm. No hemoptysis.  GASTROINTESTINAL: Positive for nausea on Tuesday. No vomiting. No abdominal pain. No diarrhea. No constipation. No bright red blood per rectum. No melena.  GENITOURINARY: No burning on urination. No hematuria.  MUSCULOSKELETAL: Positive for muscle aches in the legs.  INTEGUMENTARY: No rashes or eruptions.  NEUROLOGIC: No fainting or blackouts.  PSYCHIATRIC: No anxiety or depression.  ENDOCRINE: No thyroid problems. HEMATOLOGIC AND LYMPHATIC: No anemia. No easy bruising or bleeding.   PHYSICAL EXAMINATION:  VITAL SIGNS: On presentation to the hospital included a temperature of 99.7, at home 103, pulse 107, respirations 20, blood pressure 148/74, pulse oximetry 98% on room air.  GENERAL: No respiratory distress. Sweating in bed.  EYES: Conjunctivae and lids normal. Pupils equal, round, and reactive to light. Extraocular muscles intact. No nystagmus. EARS, NOSE, MOUTH AND THROAT: Tympanic membranes: No erythema. Nasal mucosa: No erythema. Throat: No erythema. No exudate seen. Lips and gums: No lesions.  NECK: No JVD. No bruits. No lymphadenopathy. No thyromegaly. No thyroid nodules palpated.  RESPIRATORY: Lungs decreased breath sounds bilaterally anteriorly. I did hear a wheeze. Positive rhonchi throughout entire lung field.  CARDIOVASCULAR: S1 and S2, tachycardic. No gallops, rubs, or murmurs heard. Carotid upstroke 2+ bilaterally. No bruits. Dorsalis pedis pulses 2+ bilaterally. No edema of the lower extremity.  ABDOMEN: Soft, nontender. No organomegaly or splenomegaly. Normoactive bowel sounds. No masses felt.  LYMPHATIC: No lymph nodes in the neck.  MUSCULOSKELETAL: No clubbing, edema, or cyanosis.  SKIN: No ulcers or  lesions seen.  NEUROLOGIC: Cranial nerves II through XII grossly intact. Deep tendon reflexes 2+ in bilateral lower extremities.  PSYCHIATRIC: The patient is alert, oriented to person, place,  and time.   LABORATORY AND RADIOLOGICAL DATA: Chest x-ray: Pneumonia, right lower lobe. White blood cell count 18.2, hemoglobin and hematocrit 15.7 and 48.4, platelet count of 233,000. Glucose 120, BUN 13, creatinine 1.18, sodium 135, potassium 3.9, chloride 101, CO2 of 28, calcium 8.4. Liver function tests normal range. Urinalysis negative.   ASSESSMENT AND PLAN:  1.  Clinical sepsis with pneumonia, tachycardia, fever, tachypnea. Rocephin and Zithromax to be given intravenously. Sputum culture and blood cultures ordered. Can finish up the Tamiflu that was given from primary care physician's office. Will put in isolation because of that. Will also give intravenous fluid hydration.  2.  Asthma. I will give a small dose of Solu-Medrol since the patient did have some wheeze anteriorly. Give nebulizer treatments. Continue Symbicort.  3.  Glaucoma. Continue latanoprost and brimonidine.  TIME SPENT ON PATIENT CARE: 55 minutes.  CODE STATUS: The patient is a full code.    ____________________________ Tana Conch. Leslye Peer, MD rjw:ST D: 04/07/2014 21:20:14 ET T: 04/07/2014 22:02:42 ET JOB#: [id number]  cc: Tana Conch. Leslye Peer, MD, <Dictator> Jerrell Belfast, MD Marisue Brooklyn MD ELECTRONICALLY SIGNED 04/11/2014 17:45

## 2014-10-16 NOTE — Consult Note (Signed)
Brief Consult Note: Diagnosis: dyspnea, pneumonia.   Comments: Received call to evaluate patient in the morning with dyspnea, malaise, weakness, admitted for suspected Community Acquired Pneumonia.  Patient has has a significant history of worsening dyspnea over the past year suspected to be due to worsening asthma.  Elevated IgE (2010) as an outpatient, reviewed of his last CT suggest a pattern of recurrent infection (possibly fungal) in the chest, currently being worked up for allergy profile, aspergillus panel, and mold profile.  Also, he was seen by ENT and his being treated with Bactrim for MRSA in the sinuses.   I have reviewed his notes, chest xray and labs and have the following recommendations based on the is review.  Full consult note to follow in the morning.   A\P 74 yo M with PMHx of Asthma, recurrent bronchitis, sinusitis, now admitted for CAP Recommended Plan 1. IV solumedrol 60mg  q6hrs x 2 days, then transition to Prednisone 60mg  PO (taper over 12 days) 2. Pulmicort neb q12hrs, duonebs q4-6 hrs, mucomyst with duonebs 3. continue with rocephin and azithromycin 4. CT Chest and CT Sinuses 5. Incentive spirometry 6. continue with bactrim for suspected MRSA sinusitis.   full consult note to follow  Thank you for consulting [facility] and [place], MD [facility] Pager - [place] [phone].  Electronic Signatures for Addendum Section:  Vilinda Boehringer (MD) (Signed Addendum 17-Feb-16 21:36)  Add ECHO to the current plan.   Electronic Signatures: Vilinda Boehringer (MD)  (Signed 17-Feb-16 21:32)  Authored: Brief Consult Note   Last Updated: 17-Feb-16 21:36 by Vilinda Boehringer (MD)

## 2014-10-16 NOTE — Discharge Summary (Signed)
PATIENT NAME:  [name], [name] MR#:  [id number] DATE OF BIRTH:  December 11, 1940  DATE OF ADMISSION:  08/03/2014 DATE OF DISCHARGE:  08/05/2014  ADMITTING DIAGNOSES:  1.  Sepsis from pneumonia.  2.  Asthma.  DISCHARGE DIAGNOSES: 1.  Sepsis with leukocytosis, hypoxia, tachycardia and respiratory distress secondary to community-acquired pneumonia.  2.  Acute bronchoconstriction from pneumonia, needing steroids.  3.  Chronic paranasal sinusitis   CONSULTATIONS: Pulmonology.   PROCEDURES: None.  BRIEF HISTORY AND PHYSICAL: The patient is a 74 year old pleasant male who comes into the ED with the chief complaint of fever, chills and weakness. Please review history and physical for details.   HOSPITAL COURSE BASED ON PROBLEM: 1.  Sepsis secondary to community-acquired pneumonia. The patient came into the ED with the chief complaint of fever, chills and weakness. The patient was found to be hypoxic, tachycardic with elevated white blood cell count. He has been following up with pulmonology for chronic sinusitis and has been placed on chronic Bactrim. In the ED, the patient was given nebulizer treatments, sputum cultures were ordered and the patient was started on IV Rocephin and azithromycin. Pulmonology was consulted. Eventually the patient was seen by Dr. Stevenson Clinch from pulmonology group. He has recommended to continue the patient on Rocephin and azithromycin and at the time of discharge recommended Augmentin 500 mg p.o. b.i.d. for a total of 10 day course. Also, the patient is started on prednisone tapering dose. The patient also has received nebulizer treatments during the hospital stay with Pulmicort and DuoNebs. His home inhalers including Q-Var were resumed at the time of discharge as the patient is clinically stable. The patient was recommended to use incentive spirometry. For chronic paranasal sinusitis, they recommended to continue Bactrim once daily basis as given by Dr. Richardson Landry. The patient is to follow  up with Dr. Richardson Landry for further recommendations regarding the continuation of the Bactrim. His clinical situation is significantly improved.  2.  For chronic paranasal sinusitis, continue Bactrim as recommended by Dr. Richardson Landry from ENT, and the patient is getting discharged under stable condition.  3.  Uncomplicated asthma with some bronchoconstriction from underlying community-acquired pneumonia. The patient was given prednisone tapering dose for a total of 12 days.   PHYSICAL EXAMINATION:  VITAL SIGNS: Today temperature is 97.4, pulse 95, respirations 18, blood pressure 114/56, pulse oximetry 95% on room air.  GENERAL APPEARANCE: Not in acute distress. Moderately built and nourished.  HEENT: Normocephalic, atraumatic. Pupils are equally reactive to light and accommodation. No scleral icterus. No conjunctival injection. No sinus tenderness. No postnasal drip. Moist mucous membranes.  NECK: Supple. No JVD. No thyromegaly. Normal range of motion. LUNGS: Clear to auscultation bilaterally, except right lower lobe area with diminished breath sounds with crackles are present.  CARDIOVASCULAR: S1, S2 normal. Regular rate and rhythm. No murmurs.  GASTROINTESTINAL: Soft. Bowel sounds are positive in all 4 quadrants. Nontender, nondistended. No masses.  NEUROLOGIC: Awake, alert, and oriented x3. Cranial nerves II through XII are grossly intact. Motor and sensory are intact. Reflexes are 2+.  EXTREMITIES: No edema, no cyanosis, no clubbing.  SKIN: Warm to touch. Normal turgor. No rashes. No lesions.  MUSCULOSKELETAL: No joint effusion, tenderness or erythema. PSYCHIATRIC: Normal mood and affect.  DIAGNOSTIC DATA: WBC 19.4, hemoglobin, hematocrit, and platelets are normal. Blood culture no growth in 2 days, collected on the 17th of February. Urine culture no growth. Sputum with heavy growth of unidentified organism, many red blood cells. PCP to follow up on this. Magnesium 2.4. Troponin  less than 0.02. BMP is  normal.   CAT scan of the chest without contrast performed on February 18th: Worsening of heterogenous consolidation over the first posterior basilar segments of the lower lobes, likely infection. Small left-sided pleural effusion and reactive right subcarinal lymph node.   CAT scan of the sinuses without contrast: Chronic paranasal sinusitis, acute right maxillary sinusitis.   DISCHARGE MEDICATIONS: Latanoprost ophthalmic eyedrops 1 drop to each effected eye once a day, calcium with vitamin D 1 tablet p.o. 2 times a day, Singulair 10 mg p.o. once daily, Ventolin 2 puff inhalation every 6 hours as needed for shortness of breath, aspirin 81 mg p.o. once daily, albuterol inhalation every 6 hours as needed for shortness of breath, azelastine nasal spray 2 sprays nasally 2 times a day, brimonidine 1 drop ophthalmic each effected eye 2 times a day, cetirizine 10 mg p.o. once daily, multivitamin 1 tablet p.o. once daily, Q-Var 1 puff inhalation 2 times a day, fluticasone 50 mcg/inhalation 1 spray nasally 2 times a day, prednisone 5 mg tapering dose start with 55 mg and taper x 5 mg per day for a total of 11 days, Augmentin 500 mg p.o. q. 12 hours for total of 8 days as recommended by pulmonology, Florastor 250 mg 1 capsule p.o. 2 times a day for 10 days, Bactrim DS 1 tablet p.o. once daily as prophylaxis for sinusitis as recommended by Dr. Richardson Landry ENT, Neutra-Phos 1 packet p.o. 3 times a day as prescribed.  DISCHARGE INSTRUCTIONS: Follow up with primary care physician in a week, with pulmonology, Dr. Lake Bells, from [facility], in 2 to 4 weeks. Follow up with Dr. Richardson Landry ENT in 1 to 2 weeks.  DISCHARGE DIET: Regular.  DISCHARGE ACTIVITY: As tolerated.   The diagnosis and plan of care was discussed in detail with the patient and his wife at bedside. They both verbalized understanding of the plan.   TOTAL TIME SPENT ON DAY OF DISCHARGE: 45 minutes.   ____________________________ Nicholes Mango,  MD ag:sb D: 08/05/2014 14:48:27 ET T: 08/05/2014 15:23:14 ET JOB#: [id number]  cc: Nicholes Mango, MD, <Dictator> Sammuel Hines. Richardson Landry, MD Primary Care Physician Juanito Doom, MD  Nicholes Mango MD ELECTRONICALLY SIGNED 08/12/2014 15:06

## 2014-10-16 NOTE — H&P (Signed)
PATIENT NAME:  [name], [name] MR#:  [id number] DATE OF BIRTH:  02/05/1941  DATE OF ADMISSION:  08/03/2014  PRIMARY CARE PHYSICIAN: Margarita Rana, MD  REFERRING EMERGENCY ROOM PHYSICIAN: Brunilda Payor A. Edd Fabian, MD  CHIEF COMPLAINT: Fever, chills and weakness.   HISTORY OF PRESENT ILLNESS: A 74 year old male, who has a history of asthma and glaucoma. Has been having pulmonary issues for the last 1 year. Today, in the afternoon, suddenly started having shortness of breath, yellowish sputum production with cough and chills and he felt extremely weak and so came to the Emergency Room noted to have elevated right cell count, tachycardia and some hypoxia and questionable evidence of pneumonia on chest x-ray and so given to hospitalist team for further management. On further questioning, he said that he had been admitted twice in the last 1 year for pulmonary symptoms and he actually never recovered 100% from these symptoms.   REVIEW OF SYSTEMS:  CONSTITUTIONAL: Positive for fever, fatigue and generalized weakness. No weight loss or weight again.  EYES: No blurring, double vision, discharge or redness.  EARS, NOSE, THROAT: No tinnitus, ear pain or hearing loss.  RESPIRATORY: The patient is has some cough. No wheezing or no hemoptysis, but has shortness of breath.  CARDIOVASCULAR: No chest pain, orthopnea, edema, arrhythmia, palpitation.  GASTROINTESTINAL: No nausea, vomiting, diarrhea, abdominal pain.  GENITOURINARY: No dysuria, hematuria, increased frequency.  ENDOCRINE: No heat or cold intolerance. No excessive sweating.  SKIN: No acne, rashes, or lesions.  MUSCULOSKELETAL: No pain or swelling in the joints.  NEUROLOGICAL: No numbness, weakness, tremor or vertigo.  PSYCHIATRIC: No anxiety, insomnia, bipolar disorder.   PAST MEDICAL HISTORY:  1. Asthma.  2. Glaucoma.   PAST SURGICAL HISTORY:  1. Sinus surgery.  2. Polyps.  3. Submucosal resection.   SOCIAL HISTORY: No smoking. He drinks 3 to 4 wines  per week. No drug use. He is retired and was working as an Chief Financial Officer.   FAMILY HISTORY: Father died at 51 of Alzheimer's, mother died at 72 of congestive heart failure.   HOME MEDICATIONS:  1. Ventolin 90 mcg per  inhalation, 2 puffs inhalation 4 times a day. 2. Qvar 80 mcg inhalation 4 puffs 2 times a day.  3. Montelukast 10 mg oral once a day.  4. Latanoprost ophthalmic solution, each affected eye.  5. Fluticasone nasal 50 mcg spray 2 times a day.  7. Calcium 600 plus vitamin D 2 times a day.  8. Brimonidine ophthalmic solution, 1 drop in affected eye 2 times a day.  9. Aspirin 81 mg once a day.  10. Azelastine 137 mcg per inhalation spray 2 times a day.  11. Albuterol inhalation 3 mL 36 hours as needed for shortness of breath.   PHYSICAL EXAMINATION:  VITAL SIGNS: In ER, temperature 99.6, pulse 126, respirations 18, blood pressure 137/67 and pulse oximetry 96 with 2 liters oxygen supplementation.  GENERAL: The patient is fully alert and oriented to time, place, and person, slightly distressed because of respiratory discomfort, but cooperative.  HEENT: Head and neck atraumatic. Conjunctivae pink. Oral mucosa moist.  NECK: Supple. No JVD. Thyroid nontender.  RESPIRATORY: Bilateral equal air entry. Some crepitation present. Mild wheezing.  CARDIOVASCULAR: S1, S2 present, regular, tachycardia. No murmur appreciated. No tenderness on local palpation.  ABDOMEN: Soft, nontender. Bowel sounds present. No organomegaly.  SKIN: No acne, rashes, or lesions.  MUSCULOSKELETAL: No pain or swelling in the joints.  NEUROLOGICAL: No numbness, weakness, tremor or vertigo. Power 5/5, follows commands. No gross abnormality.  Sensation is intact.  JOINTS: No tenderness or swelling.  LEGS: No edema.  PSYCHIATRIC: Does not appear in any acute psychiatric illness at this time.  ABDOMEN: Soft, nontender. Bowel sounds present. No organomegaly.   IMPORTANT LABORATORY RESULTS: WBC count 19.9, hemoglobin is 16.6,  platelet count 189, MCV 96, MCH 31.1.   Chest x-ray, portable, is done, which shows a right base posteromedially atelectasis scar and residual infiltrate. No pulmonary edema.   Lactic acid 1.3.   Magnesium 1.7. Glucose level 88, creatinine 1.16, sodium 138, potassium is 4.2, chloride 102, and CO2 is 28. Troponin less than 0.02. Urinalysis is grossly negative.   ASSESSMENT AND PLAN: A 74 year old male patient, with complaint of sudden onset respiratory distress cough and fever and chills. He has been having on and off respiratory symptoms for almost 1 year.   1. Sepsis. It is evident by tachycardia, elevated white cell count, hypoxia and respiratory distress. This is secondary to pneumonia. We will treat underlying cause.  2. Community-acquired pneumonia. Right now, we will start him on Rocephin and azithromycin and get sputum culture. The patient had these symptoms on and off, so may benefit from CAT scan and pulmonary consult.  3. Asthma. The patient has mild, wheezing. Will give steroid and nebulizer treatment and he is on antibiotic for his pneumonia.  CODE STATUS: Full code.   TOTAL TIME SPENT ON THIS ADMISSION: 50 minutes.   ____________________________ Ceasar Lund Anselm Jungling, MD vgv:ap D: 08/03/2014 22:08:25 ET T: 08/03/2014 23:26:05 ET JOB#: [id number]  cc: Ceasar Lund. Anselm Jungling, MD, <Dictator> Jerrell Belfast, MD Vaughan Basta MD ELECTRONICALLY SIGNED 08/29/2014 12:49

## 2014-10-19 DIAGNOSIS — M25519 Pain in unspecified shoulder: Secondary | ICD-10-CM | POA: Insufficient documentation

## 2014-10-19 DIAGNOSIS — G4700 Insomnia, unspecified: Secondary | ICD-10-CM | POA: Insufficient documentation

## 2014-10-19 DIAGNOSIS — S39012A Strain of muscle, fascia and tendon of lower back, initial encounter: Secondary | ICD-10-CM | POA: Insufficient documentation

## 2014-10-19 DIAGNOSIS — C4491 Basal cell carcinoma of skin, unspecified: Secondary | ICD-10-CM | POA: Insufficient documentation

## 2014-10-19 DIAGNOSIS — M79673 Pain in unspecified foot: Secondary | ICD-10-CM | POA: Insufficient documentation

## 2014-10-19 DIAGNOSIS — R748 Abnormal levels of other serum enzymes: Secondary | ICD-10-CM | POA: Insufficient documentation

## 2014-10-19 DIAGNOSIS — Z8614 Personal history of Methicillin resistant Staphylococcus aureus infection: Secondary | ICD-10-CM | POA: Insufficient documentation

## 2014-11-04 ENCOUNTER — Inpatient Hospital Stay: Payer: PPO

## 2014-11-04 ENCOUNTER — Emergency Department: Payer: PPO

## 2014-11-04 ENCOUNTER — Inpatient Hospital Stay
Admission: EM | Admit: 2014-11-04 | Discharge: 2014-11-09 | DRG: 199 | Disposition: A | Discharge status: Home / Self Care | Payer: PPO | Attending: Internal Medicine | Admitting: Internal Medicine

## 2014-11-04 DIAGNOSIS — J9601 Acute respiratory failure with hypoxia: Secondary | ICD-10-CM | POA: Diagnosis present

## 2014-11-04 DIAGNOSIS — Z9689 Presence of other specified functional implants: Secondary | ICD-10-CM

## 2014-11-04 DIAGNOSIS — Z7951 Long term (current) use of inhaled steroids: Secondary | ICD-10-CM

## 2014-11-04 DIAGNOSIS — R0602 Shortness of breath: Secondary | ICD-10-CM | POA: Diagnosis present

## 2014-11-04 DIAGNOSIS — Z87891 Personal history of nicotine dependence: Secondary | ICD-10-CM

## 2014-11-04 DIAGNOSIS — J84116 Cryptogenic organizing pneumonia: Secondary | ICD-10-CM | POA: Diagnosis present

## 2014-11-04 DIAGNOSIS — K219 Gastro-esophageal reflux disease without esophagitis: Secondary | ICD-10-CM | POA: Diagnosis present

## 2014-11-04 DIAGNOSIS — R911 Solitary pulmonary nodule: Secondary | ICD-10-CM

## 2014-11-04 DIAGNOSIS — I10 Essential (primary) hypertension: Secondary | ICD-10-CM | POA: Diagnosis present

## 2014-11-04 DIAGNOSIS — J45909 Unspecified asthma, uncomplicated: Secondary | ICD-10-CM | POA: Diagnosis present

## 2014-11-04 DIAGNOSIS — H409 Unspecified glaucoma: Secondary | ICD-10-CM | POA: Diagnosis present

## 2014-11-04 DIAGNOSIS — Z85828 Personal history of other malignant neoplasm of skin: Secondary | ICD-10-CM

## 2014-11-04 DIAGNOSIS — Z8614 Personal history of Methicillin resistant Staphylococcus aureus infection: Secondary | ICD-10-CM | POA: Diagnosis not present

## 2014-11-04 DIAGNOSIS — J309 Allergic rhinitis, unspecified: Secondary | ICD-10-CM | POA: Diagnosis present

## 2014-11-04 DIAGNOSIS — J939 Pneumothorax, unspecified: Secondary | ICD-10-CM | POA: Diagnosis present

## 2014-11-04 DIAGNOSIS — Z7982 Long term (current) use of aspirin: Secondary | ICD-10-CM | POA: Diagnosis not present

## 2014-11-04 DIAGNOSIS — K5900 Constipation, unspecified: Secondary | ICD-10-CM | POA: Diagnosis not present

## 2014-11-04 DIAGNOSIS — J95811 Postprocedural pneumothorax: Secondary | ICD-10-CM | POA: Diagnosis present

## 2014-11-04 DIAGNOSIS — Z79899 Other long term (current) drug therapy: Secondary | ICD-10-CM | POA: Diagnosis not present

## 2014-11-04 LAB — CBC
HEMATOCRIT: 46.2 % (ref 40.0–52.0)
Hemoglobin: 15.6 g/dL (ref 13.0–18.0)
MCH: 32.7 pg (ref 26.0–34.0)
MCHC: 33.8 g/dL (ref 32.0–36.0)
MCV: 96.9 fL (ref 80.0–100.0)
Platelets: 198 10*3/uL (ref 150–440)
RBC: 4.76 MIL/uL (ref 4.40–5.90)
RDW: 13.8 % (ref 11.5–14.5)
WBC: 12.6 10*3/uL — ABNORMAL HIGH (ref 3.8–10.6)

## 2014-11-04 LAB — CK TOTAL AND CKMB (NOT AT ARMC): Total CK: 156 U/L (ref 49–397)

## 2014-11-04 LAB — BASIC METABOLIC PANEL
Anion gap: 8 (ref 5–15)
BUN: 13 mg/dL (ref 6–20)
CALCIUM: 8.7 mg/dL — AB (ref 8.9–10.3)
CO2: 26 mmol/L (ref 22–32)
CREATININE: 1.23 mg/dL (ref 0.61–1.24)
Chloride: 105 mmol/L (ref 101–111)
GFR calc Af Amer: >60 mL/min (ref >60)
GFR, EST NON AFRICAN AMERICAN: 55 mL/min — AB (ref >60)
Glucose, Bld: 117 mg/dL — ABNORMAL HIGH (ref 65–99)
Potassium: 4.2 mmol/L (ref 3.5–5.1)
Sodium: 139 mmol/L (ref 135–145)

## 2014-11-04 LAB — TROPONIN I: Troponin I: <0.03 ng/mL (ref <0.031)

## 2014-11-04 MED ORDER — ENOXAPARIN SODIUM 40 MG/0.4ML ~~LOC~~ SOLN
40.0000 mg | SUBCUTANEOUS | Status: DC
  Administered 2014-11-04 – 2014-11-09 (×6): 40 mg via SUBCUTANEOUS
  Filled 2014-11-04 (×6): qty 0.4

## 2014-11-04 MED ORDER — ACETAMINOPHEN 325 MG PO TABS
650.0000 mg | ORAL_TABLET | Freq: Four times a day (QID) | ORAL | Status: DC | PRN
  Administered 2014-11-08: 650 mg via ORAL
  Filled 2014-11-04: qty 2

## 2014-11-04 MED ORDER — ALBUTEROL SULFATE (2.5 MG/3ML) 0.083% IN NEBU
INHALATION_SOLUTION | RESPIRATORY_TRACT | Status: AC
  Filled 2014-11-04: qty 6

## 2014-11-04 MED ORDER — MORPHINE SULFATE 2 MG/ML IJ SOLN
1.0000 mg | INTRAMUSCULAR | Status: DC | PRN
  Administered 2014-11-04: 6 mg via INTRAVENOUS
  Administered 2014-11-05: 4 mg via INTRAVENOUS
  Administered 2014-11-05: 6 mg via INTRAVENOUS
  Administered 2014-11-06 (×2): 4 mg via INTRAVENOUS
  Administered 2014-11-06: 6 mg via INTRAVENOUS
  Administered 2014-11-06: 4 mg via INTRAVENOUS
  Administered 2014-11-06: 6 mg via INTRAVENOUS
  Administered 2014-11-07 (×2): 4 mg via INTRAVENOUS
  Administered 2014-11-07: 2 mg via INTRAVENOUS
  Administered 2014-11-07: 4 mg via INTRAVENOUS
  Administered 2014-11-08: 2 mg via INTRAVENOUS
  Filled 2014-11-04: qty 1
  Filled 2014-11-04 (×6): qty 2
  Filled 2014-11-04 (×4): qty 3
  Filled 2014-11-04: qty 1
  Filled 2014-11-04: qty 2

## 2014-11-04 MED ORDER — ALBUTEROL SULFATE (2.5 MG/3ML) 0.083% IN NEBU: 2.5000 mg | INHALATION_SOLUTION | Freq: Once | RESPIRATORY_TRACT | Status: AC

## 2014-11-04 MED ORDER — DIPHENHYDRAMINE HCL 50 MG/ML IJ SOLN
12.5000 mg | Freq: Four times a day (QID) | INTRAMUSCULAR | Status: DC | PRN
Start: 2014-11-04 — End: 2014-11-09

## 2014-11-04 MED ORDER — ONDANSETRON HCL 4 MG/2ML IJ SOLN
INTRAMUSCULAR | Status: AC
  Administered 2014-11-04: 4 mg via INTRAVENOUS
  Filled 2014-11-04: qty 2

## 2014-11-04 MED ORDER — LORATADINE 10 MG PO TABS
10.0000 mg | ORAL_TABLET | Freq: Every day | ORAL | Status: DC
  Administered 2014-11-04 – 2014-11-09 (×6): 10 mg via ORAL
  Filled 2014-11-04 (×6): qty 1

## 2014-11-04 MED ORDER — ACETAMINOPHEN 650 MG RE SUPP: 650.0000 mg | Freq: Four times a day (QID) | RECTAL | Status: DC | PRN

## 2014-11-04 MED ORDER — FAMOTIDINE IN NACL 20-0.9 MG/50ML-% IV SOLN
20.0000 mg | Freq: Two times a day (BID) | INTRAVENOUS | Status: DC
  Administered 2014-11-04 – 2014-11-07 (×7): 20 mg via INTRAVENOUS
  Filled 2014-11-04 (×9): qty 50

## 2014-11-04 MED ORDER — CALCIUM CARBONATE ANTACID 500 MG PO CHEW
2.5000 | CHEWABLE_TABLET | Freq: Every day | ORAL | Status: DC
  Administered 2014-11-04 – 2014-11-05 (×2): 500 mg via ORAL
  Administered 2014-11-06: 200 mg via ORAL
  Administered 2014-11-07 – 2014-11-09 (×3): 500 mg via ORAL
  Filled 2014-11-04 (×2): qty 1
  Filled 2014-11-04: qty 2
  Filled 2014-11-04: qty 1
  Filled 2014-11-04: qty 3
  Filled 2014-11-04 (×2): qty 1

## 2014-11-04 MED ORDER — ONDANSETRON HCL 4 MG/2ML IJ SOLN
4.0000 mg | Freq: Four times a day (QID) | INTRAMUSCULAR | Status: DC | PRN
  Administered 2014-11-04 – 2014-11-07 (×5): 4 mg via INTRAVENOUS
  Filled 2014-11-04 (×4): qty 2

## 2014-11-04 MED ORDER — MORPHINE SULFATE 2 MG/ML IJ SOLN
2.0000 mg | Freq: Once | INTRAMUSCULAR | Status: AC
  Administered 2014-11-04: 2 mg via INTRAVENOUS

## 2014-11-04 MED ORDER — LATANOPROST 0.005 % OP SOLN
1.0000 [drp] | Freq: Every day | OPHTHALMIC | Status: DC
  Administered 2014-11-04 – 2014-11-09 (×4): 1 [drp] via OPHTHALMIC
  Filled 2014-11-04: qty 2.5

## 2014-11-04 MED ORDER — OYSTER CALCIUM 500 MG PO TABS
1250.0000 mg | ORAL_TABLET | Freq: Every day | ORAL | Status: DC
  Filled 2014-11-04: qty 1

## 2014-11-04 MED ORDER — MORPHINE SULFATE 2 MG/ML IJ SOLN
1.0000 mg | INTRAMUSCULAR | Status: DC | PRN
  Administered 2014-11-04 (×3): 4 mg via INTRAVENOUS
  Filled 2014-11-04 (×4): qty 2

## 2014-11-04 MED ORDER — HYDROCODONE-ACETAMINOPHEN 5-325 MG PO TABS
1.0000 | ORAL_TABLET | ORAL | Status: DC | PRN
  Administered 2014-11-04 – 2014-11-05 (×2): 2 via ORAL
  Administered 2014-11-06: 1 via ORAL
  Administered 2014-11-07: 2 via ORAL
  Administered 2014-11-07: 1 via ORAL
  Administered 2014-11-07: 2 via ORAL
  Administered 2014-11-09: 1 via ORAL
  Filled 2014-11-04 (×2): qty 1
  Filled 2014-11-04 (×2): qty 2
  Filled 2014-11-04: qty 1
  Filled 2014-11-04 (×3): qty 2

## 2014-11-04 MED ORDER — ASPIRIN 81 MG PO CHEW
81.0000 mg | CHEWABLE_TABLET | Freq: Every day | ORAL | Status: DC
  Administered 2014-11-04 – 2014-11-09 (×6): 81 mg via ORAL
  Filled 2014-11-04 (×6): qty 1

## 2014-11-04 MED ORDER — LIDOCAINE-EPINEPHRINE (PF) 1 %-1:200000 IJ SOLN
INTRAMUSCULAR | Status: AC
  Administered 2014-11-04: 07:00:00
  Filled 2014-11-04: qty 30

## 2014-11-04 MED ORDER — MORPHINE SULFATE 2 MG/ML IJ SOLN
INTRAMUSCULAR | Status: AC
  Administered 2014-11-04: 2 mg via INTRAVENOUS
  Filled 2014-11-04: qty 1

## 2014-11-04 MED ORDER — ONDANSETRON HCL 4 MG/2ML IJ SOLN
4.0000 mg | Freq: Once | INTRAMUSCULAR | Status: AC
  Administered 2014-11-04: 4 mg via INTRAVENOUS

## 2014-11-04 MED ORDER — AZELASTINE HCL 0.1 % NA SOLN
2.0000 | Freq: Two times a day (BID) | NASAL | Status: DC
  Administered 2014-11-04 – 2014-11-09 (×10): 2 via NASAL
  Filled 2014-11-04: qty 30

## 2014-11-04 MED ORDER — DIPHENHYDRAMINE HCL 12.5 MG/5ML PO ELIX: 12.5000 mg | ORAL_SOLUTION | Freq: Four times a day (QID) | ORAL | Status: DC | PRN

## 2014-11-04 MED ORDER — POTASSIUM CL IN DEXTROSE 5% 20 MEQ/L IV SOLN
20.0000 meq | INTRAVENOUS | Status: DC
  Administered 2014-11-04 – 2014-11-07 (×5): 20 meq via INTRAVENOUS
  Filled 2014-11-04 (×7): qty 1000

## 2014-11-04 NOTE — ED Notes (Signed)
Had bronchoscopy at [facility] Wednesday for lung mass and woke up this am with shob.

## 2014-11-04 NOTE — Progress Notes (Signed)
Dr. Burt Knack notified of nausea, ordered 4 mg IV Zofran q 6 hours as needed.

## 2014-11-04 NOTE — ED Notes (Signed)
   11/04/14 0550  Respiratory  Respiratory (WDL) X  L Breath Sounds Clear  R Breath Sounds Diminished  O2 Device Nasal Cannula  O2 Flow Rate (L/min) 2 L/min  Pt c/o increased shortness of breath this am. Pt reports no relief after using inhaler at home. Oxygen 2L via [place] applied due to room air sats of 90%. With oxygen 2L via [place] sats improved to 96%.

## 2014-11-04 NOTE — Op Note (Signed)
Operative Note: Closed Thoracostomy Tube Placement  Preoperative Diagnosis: Right Pneumothorax  Postoperative Diagnosis: Same  Operation Performed: 10.2 French Right Closed Thoracostomy Tube Placement  Surgeon: Laverle Patter., M.D.   Anesthesia: Local (< 10 mL 1% Lidocaine)  Date of Procedure: 11/04/2014   Procedure in Detail:  The patient's chest was prepped and draped in the usual sterile fashion. A 10.2 French chest tube was inserted into the pleural space, secured to the skin with a 2-0 Silk Suture, and connected to the PleurVac at 20 cm water suction, and a water tight dressing was applied. pCXR confirmed good placement.   Complications: None; the patient tolerated the procedure well.   Consuela Mimes, MD 11/04/2014

## 2014-11-04 NOTE — ED Notes (Signed)
   11/04/14 0705  Respiratory  Respiratory (WDL) X  Bilateral Breath Sounds Clear  Respiratory Pattern Unlabored  Right Chest tube remains in place.

## 2014-11-04 NOTE — ED Notes (Signed)
Surgery MD at bedside.

## 2014-11-04 NOTE — Progress Notes (Addendum)
RN notified Dr of pt being SOB; Increased pain throughout day, Orders received for Portable stat chest xray.  Remo Lipps RN

## 2014-11-04 NOTE — ED Provider Notes (Signed)
[facility] Emergency Department Provider Note  ____________________________________________  Time seen: 5:50 AM  I have reviewed the triage vital signs and the nursing notes.   HISTORY  Chief Complaint Shortness of Breath      HPI [name] is a 74 y.o. male presents with acute shortness of breath with onset at 3 AM. Of note patient underwent a lung biopsy at [facility] 2 days prior. Patient noted to be hypoxic on presentation to the emergency department patient does admit to right sided chest pain     Past Medical History  Diagnosis Date  . Glaucoma   . Hypertension   . AB (asthmatic bronchitis)   . Basal cell cancer   . Cellulitis   . Hx MRSA infection   . Insomnia   . Upper back strain   . Foreign body in skin   . Osteoarthrosis   . Mitral valve disorder   . Arthropathy   . Asthma   . Allergic rhinitis   . Insomnia   . Hx MRSA infection     Patient Active Problem List   Diagnosis Date Noted  . Basal cell carcinoma 10/19/2014  . Abnormal liver enzymes 10/19/2014  . Personal history of methicillin resistant Staphylococcus aureus 10/19/2014  . Cannot sleep 10/19/2014  . Heel pain 10/19/2014  . Pain in shoulder 10/19/2014  . Back strain 10/19/2014  . Pulmonary aspergillosis, allergic bronchopulmonary type 07/30/2014  . ABPA (allergic bronchopulmonary aspergillosis) 07/30/2014  . CAP (community acquired pneumonia) 05/03/2014  . Abnormal CXR 04/01/2014  . Paroxysmal tachycardia, unspecified 06/23/2013  . Asymptomatic PVCs 06/23/2013  . Beat, premature ventricular 06/23/2013  . Cough 10/19/2012  . Allergic rhinitis 01/17/2009  . Asthma, chronic obstructive, without status asthmaticus 11/04/2008  . CAFL (chronic airflow limitation) 11/04/2008  . HYPERTENSION 02/09/2008  . Arthropathia 08/28/2007  . Disorder of mitral valve 07/08/2005  . Benign prostatic hypertrophy without urinary obstruction 05/16/1997  . LBP (low back pain)  05/16/1997  . Degenerative arthritis of hip 03/18/1995    Past Surgical History  Procedure Laterality Date  . Appendectomy    . Nasal sinus surgery      Current Outpatient Rx  Name  Route  Sig  Dispense  Refill  . albuterol (PROVENTIL) (2.5 MG/3ML) 0.083% nebulizer solution   Nebulization   Take 3 mLs (2.5 mg total) by nebulization every 6 (six) hours.   75 mL   12     Dx code: J45.40   . aspirin 81 MG tablet   Oral   Take 81 mg by mouth daily.         Marland Kitchen azelastine (ASTELIN) 0.1 % nasal spray   Each Nare   Place 2 sprays into both nostrils 2 (two) times daily. Use in each nostril as directed         . beclomethasone (QVAR) 80 MCG/ACT inhaler   Inhalation   Inhale 4 puffs into the lungs 2 (two) times daily.   1 Inhaler   5   . brimonidine (ALPHAGAN) 0.15 % ophthalmic solution   Both Eyes   Place 1 drop into both eyes 2 (two) times daily.          . brimonidine (ALPHAGAN) 0.15 % ophthalmic solution      ALPHAGAN P, 0.15% (Ophthalmic Solution)  1 drop each eye twice a day for 0 days  Quantity: 0.00;  Refills: 0   Ordered :11-Apr-2010  Darlin Priestly ;  Started 24-Aug-2009 Active Comments: DX: 365.9         .  calcium carbonate (OS-CAL) 600 MG TABS   Oral   Take 600 mg by mouth daily.          . cetirizine (ZYRTEC) 10 MG tablet   Oral   Take 10 mg by mouth daily.         . fluticasone (FLONASE) 50 MCG/ACT nasal spray   Each Nare   Place 1 spray into both nostrils 2 (two) times daily.         Marland Kitchen latanoprost (XALATAN) 0.005 % ophthalmic solution   Both Eyes   Place 1 drop into both eyes daily.         . Multiple Vitamin (MULTIVITAMIN) tablet   Oral   Take 1 tablet by mouth daily.         . predniSONE (DELTASONE) 20 MG tablet   Oral   Take 1 tablet (20 mg total) by mouth daily with breakfast.   30 tablet   2   . Respiratory Therapy Supplies (FLUTTER) DEVI      Use 2-4 times daily   1 each   0     Allergies Timolol maleate;  Budesonide-formoterol fumarate; and Lisinopril  Family History  Problem Relation Age of Onset  . Breast cancer Sister   . Heart failure Mother   . Cancer Sister     pancreas    Social History History  Substance Use Topics  . Smoking status: Former Smoker -- 0.50 packs/day for 3 years    Types: Cigarettes    Quit date: 06/17/1958  . Smokeless tobacco: Never Used     Comment: smoking while in college  . Alcohol Use: Yes     Comment: OCCASSIONAL    Review of Systems  Constitutional: Negative for fever. Eyes: Negative for visual changes. ENT: Negative for sore throat. Cardiovascular: Positive for chest pain. Respiratory: Positive for shortness of breath. Gastrointestinal: Negative for abdominal pain, vomiting and diarrhea. Genitourinary: Negative for dysuria. Musculoskeletal: Negative for back pain. Skin: Negative for rash. Neurological: Negative for headaches, focal weakness or numbness.  10-point ROS otherwise negative.  ____________________________________________   PHYSICAL EXAM:  VITAL SIGNS: ED Triage Vitals  Enc Vitals Group     BP 11/04/14 0542 132/75 mmHg     Pulse Rate 11/04/14 0542 99     Resp 11/04/14 0542 18     Temp 11/04/14 0542 98.3 F (36.8 C)     Temp src --      SpO2 11/04/14 0542 99 %     Weight 11/04/14 0542 174 lb (78.926 kg)     Height 11/04/14 0542 6\' 2"  (1.88 m)     Head Cir --      Peak Flow --      Pain Score --      Pain Loc --      Pain Edu? --      Excl. in [place]? --      Constitutional: Alert and oriented. Well appearing and in no distress. Eyes: Conjunctivae are normal. PERRL. Normal extraocular movements. ENT   Head: Normocephalic and atraumatic.   Nose: No congestion/rhinnorhea.   Mouth/Throat: Mucous membranes are moist.   Neck: No stridor. Hematological/Lymphatic/Immunilogical: No cervical lymphadenopathy. Cardiovascular: Normal rate, regular rhythm. Normal and symmetric distal pulses are present in all  extremities. No murmurs, rubs, or gallops. Respiratory:  Diminished breath sounds noted on the right, positive accessory muscle use positive tachypnea Gastrointestinal: Soft and nontender. No distention. There is no CVA tenderness. Genitourinary: deferred Musculoskeletal: Nontender with normal range of  motion in all extremities. No joint effusions.  No lower extremity tenderness nor edema. Neurologic:  Normal speech and language. No gross focal neurologic deficits are appreciated. Speech is normal.  Skin:  Skin is warm, dry and intact. No rash noted. Psychiatric: Mood and affect are normal. Speech and behavior are normal. Patient exhibits appropriate insight and judgment.  ____________________________________________        RADIOLOGY  Right pneumothorax  ____________________________________________   PROCEDURES  Procedure(s) performed: Dr Leanora Cover placed a 10.2 right chest tube  Critical Care: 60 minutes ____________________________________________   INITIAL IMPRESSION / ASSESSMENT AND PLAN / ED COURSE  Pertinent labs & imaging results that were available during my care of the patient were reviewed by me and considered in my medical decision making (see chart for details).  Chest x-ray reviewed in the room following completion which revealed right pneumothorax. Dr. Glenice Bow notified immediately. Chest tube placed by Dr Leanora Cover  ____________________________________________   FINAL CLINICAL IMPRESSION(S) / ED DIAGNOSES  Final diagnoses:  Pneumothorax on right      Gregor Hams, MD 11/04/14 [phone]

## 2014-11-04 NOTE — H&P (Signed)
HPI [name] is a 74 y.o. male.   This patient is a 74 year old gentleman who is admitted to the hospital this morning after an acute episode of shortness of breath. His history dates back to over a year ago when he began experiencing episodes of shortness of breath and sinus drainage. Over the course of the last series had 2 admissions to the hospital for pneumonia and has also been seen by Dr. Lake Bells in the [facility] pulmonary and Dr. Richardson Landry in ENT. Over the course of the areas had an extensive evaluation and ultimately ended up going to [facility] for sinus surgery several weeks ago. At that time he was found to have significant sinus infections and also underwent bronchoscopy 2 days ago for further follow-up. He states at the time he was going to have his sinuses flushed with saline but he is unclear if that was actually performed. He did undergo bronchoscopy and a biopsy which we presume was of the right lung. He did well for 36 hours after surgery but presented to the emergency room this morning with complaints of acute onset of shortness of breath and right-sided chest pain. 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 A small bore chest tube was placed with prompt reexpansion of the right lung. The patient is now admitted for management of his iatrogenic right-sided pneumothorax.  He states that his breathing is much improved. He denied any recent fevers chills or cough. He has a history of allergies for which she follows up with Dr. Richardson Landry. He is in the process of transferring his care to [facility].   Past Medical History  Diagnosis Date  . Glaucoma   . Hypertension   . AB (asthmatic bronchitis)   . Basal cell cancer   . Cellulitis   . Hx MRSA infection   . Insomnia   . Upper back strain   . Foreign body in skin   . Osteoarthrosis    . Mitral valve disorder   . Arthropathy   . Asthma   . Allergic rhinitis   . Insomnia   . Hx MRSA infection     Past Surgical History  Procedure Laterality Date  . Appendectomy    . Nasal sinus surgery      Family History  Problem Relation Age of Onset  . Breast cancer Sister   . Heart failure Mother   . Cancer Sister     pancreas    Social History History  Substance Use Topics  . Smoking status: Former Smoker -- 0.50 packs/day for 3 years    Types: Cigarettes    Quit date: 06/17/1958  . Smokeless tobacco: Never Used     Comment: smoking while in college  . Alcohol Use: Yes     Comment: OCCASSIONAL    Allergies  Allergen Reactions  . Timolol Maleate [Timolol Maleate] Shortness Of Breath  . Budesonide-Formoterol Fumarate Other (See Comments)    Patient reports dizziness and trembling/shaky feeling.  . Lisinopril     cough    Current Facility-Administered Medications  Medication Dose Route Frequency Provider Last Rate Last Dose  . acetaminophen (TYLENOL) tablet 650 mg  650 mg Oral Q6H PRN Molly Maduro, MD       Or  . acetaminophen (TYLENOL) suppository 650 mg  650 mg Rectal Q6H PRN  Molly Maduro, MD      . aspirin chewable tablet 81 mg  81 mg Oral Daily Molly Maduro, MD      . azelastine (ASTELIN) 0.1 % nasal spray 2 spray  2 spray Each Nare BID Molly Maduro, MD      . calcium carbonate (TUMS - dosed in mg elemental calcium) chewable tablet 500 mg of elemental calcium  2.5 tablet Oral Daily Molly Maduro, MD      . dextrose 5 % with KCl 20 mEq / L  infusion  20 mEq Intravenous Continuous Molly Maduro, MD      . diphenhydrAMINE (BENADRYL) injection 12.5 mg  12.5 mg Intravenous Q6H PRN Molly Maduro, MD       Or  . diphenhydrAMINE (BENADRYL) 12.5 MG/5ML elixir 12.5 mg  12.5 mg Oral Q6H PRN Molly Maduro, MD      . enoxaparin (LOVENOX) injection 40 mg  40 mg Subcutaneous Q24H Molly Maduro, MD      . famotidine (PEPCID) IVPB 20 mg  premix  20 mg Intravenous Q12H Molly Maduro, MD      . HYDROcodone-acetaminophen (NORCO/VICODIN) 5-325 MG per tablet 1-2 tablet  1-2 tablet Oral Q4H PRN Molly Maduro, MD      . latanoprost (XALATAN) 0.005 % ophthalmic solution 1 drop  1 drop Both Eyes Daily Molly Maduro, MD      . loratadine (CLARITIN) tablet 10 mg  10 mg Oral Daily Molly Maduro, MD      . morphine 2 MG/ML injection 1-5 mg  1-5 mg Intravenous Q2H PRN Molly Maduro, MD        Location, Quality, Duration, Severity, Timing, Context, Modifying Factors, Associated Signs and Symptoms.  Review of Systems A 10 point review of systems was asked and was negative except for the following positive findings increasing shortness of breath relieved with chest tube, increased nasal drainage. Allergy to dog dander.  Blood pressure 133/64, pulse 88, temperature 97.9 F (36.6 C), resp. rate 19, height 6\' 2"  (1.88 m), weight 78.926 kg (174 lb), SpO2 96 %.  Physical Exam CONSTITUTIONAL:  Pleasant, well-developed, well-nourished, and in no acute distress. EYES: Pupils equal and reactive to light, Sclera non-icteric EARS, NOSE, MOUTH AND THROAT:  The oropharynx was clear.  Dentition is good repair.  Oral mucosa pink and moist. LYMPH NODES:  Lymph nodes in the neck and axillae were normal RESPIRATORY:  Lungs were clear.  Normal respiratory effort without pathologic use of accessory muscles of respiration CARDIOVASCULAR: Heart was regular without murmurs.  There were no carotid bruits. GI: The abdomen was soft, nontender, and nondistended. There were no palpable masses. There was no hepatosplenomegaly. There were normal bowel sounds in all quadrants. GU:   MUSCULOSKELETAL:  Normal muscle strength and tone.  No clubbing or cyanosis.   SKIN:  There were no pathologic skin lesions.  There were no nodules on palpation. NEUROLOGIC:  Sensation is normal.  Cranial nerves are grossly intact. PSYCH:  Oriented to person, place and time.   Mood and affect are normal.  Data Reviewed I have independently reviewed the patient's chest x-rays and CT scans.  I have personally reviewed the patient's imaging and medical records.    Assessment  Iatrogenic right-sided pneumothorax secondary to bronchoscopy with lung biopsy    This patient has suffered a right sided pneumothorax presumed secondary to his bronchoscopy. He has a chest tube in place without an air leak at the present time. His chest x-ray shows lung be fully expanded. The CT  scan shows bilateral lower lobe opacities most consistent with an inflammatory condition although a bronchoalveolar cell carcinoma may have a similar appearance. These were not present a year ago which makes the diagnosis of malignancy highly unlikely however.    Plan    We will continue the chest tube to -20 cm water suction. I did give the patient my business card and told him that I would be happy to speak with his pulmonologist at [facility] to review the case. He does not desire to follow-up with anyone here at this institution at the present time.    With regard to his lung biopsy I will await the phone call from his pulmonologist at [facility]. Alternatively we would just manage his chest tube until it can be safely removed and he can follow-up down at [facility].    Mehkai Gallo 11/04/2014, 9:28 AM

## 2014-11-04 NOTE — Progress Notes (Signed)
Pt has Hx of MRSA, 5 years ago. Dr. Rexene Edison notified. Orders received not to swab pt for MRSA.

## 2014-11-05 NOTE — Progress Notes (Signed)
Per Dr. Rexene Edison, bladder scan patient, is results yield greater than 200 cc straight cath patient. Via telephone order

## 2014-11-05 NOTE — Plan of Care (Addendum)
Problem: Discharge Progression Outcomes Goal: Staples/sutures removed Outcome: Not Met (add Reason) Sutures in place Goal: Other Discharge Outcomes/Goals Outcome: Progressing Pt is alert and oriented x 4, good appetite, low grade fever, up to bsc with one assist, urinary retention in morning improved with straight cath x 1, wife at bedside, continues on 2 Loxygen, c/o pain in Right lung improved with prn medication, no output via chest tube system. Denies SOB.

## 2014-11-05 NOTE — Plan of Care (Signed)
Patient alert and oriented, c/o pain on right chest. Morphine and Norco given with relief. Morphine increased to 6 mg which helps patient the most with pain. Vomitted x2, Zofran given once with improvement. Minimal drainage from chest tube. Continue to assess.

## 2014-11-05 NOTE — Progress Notes (Signed)
Surgery Progress Note  S:  Difficulty urinating (dribbling) O: AF/VSS GEN: NAD/A&Ox3 RESP: No air leak, moving air well  A/P 74 yo s/p right tube thoracostomy for post procedure ptx - bladder scan, possible straight cath - pain control - CXR tomorrow, suction for today

## 2014-11-06 ENCOUNTER — Inpatient Hospital Stay: Payer: PPO

## 2014-11-06 NOTE — Progress Notes (Signed)
Surgery Progress Note  S: Urinating better after straight cath x 1.  O: AF/VSS, good uop GEN: NAD/A&Ox3 RESP: no air leak, serosang drainage  CXR: my review shows expanded lung with chest tube  A/P 74 yo s/p postprocedure ptx, doing well - chest tube to water seal

## 2014-11-07 ENCOUNTER — Inpatient Hospital Stay: Payer: PPO

## 2014-11-07 LAB — CREATININE, SERUM
Creatinine, Ser: 1.03 mg/dL (ref 0.61–1.24)
GFR calc Af Amer: >60 mL/min (ref >60)
GFR calc non Af Amer: >60 mL/min (ref >60)

## 2014-11-07 MED ORDER — FAMOTIDINE 20 MG PO TABS
20.0000 mg | ORAL_TABLET | Freq: Two times a day (BID) | ORAL | Status: DC
  Administered 2014-11-07 – 2014-11-09 (×4): 20 mg via ORAL
  Filled 2014-11-07 (×4): qty 1

## 2014-11-07 NOTE — Progress Notes (Signed)
Patient ID: [name], male   DOB: 1940/10/18, 74 y.o.   MRN: [id number]  HISTORY: Today he mentions that he has been having severe intermittent chest pain substernal in nature for the last 4 days (since admission).  The pain is relieved with oral narcotics.  It is not associated with nausea or vomiting and he does not have shortness of breath associated with it.  Oxygen sats were 89% with walking earlier today.    Filed Vitals:   11/07/14 1548  BP: 135/76  Pulse: 98  Temp: 98.6 F (37 C)  Resp:    Wt Readings from Last 3 Encounters:  11/04/14 78.926 kg (174 lb)  08/12/14 78.926 kg (174 lb)  10/05/14 78.472 kg (173 lb)    EXAM:    Resp: Lungs are clear bilaterally.  No respiratory distress, normal effort. Heart:  Regular without murmurs Abd:  Abdomen is soft, non distended and non tender. No masses are palpable.  There is no rebound and no guarding.  Neurological: Alert and oriented to person, place, and time. Coordination normal.  Skin: Skin is warm and dry. No rash noted. No diaphoretic. No erythema. No pallor.   I have independently reviewed his 2 chest xrays from today.  I do not see any obvious pneumothorax.    No air leak on exam  Chest tube removed without difficult   ASSESSMENT: Iatrogenic right pneumothorax.  Pain is out of proportion to his clinical exam   PLAN:   I have asked our hospitalists to see him to assist with workup of underlying lung issues. We will repeat CXray with chest tube out.  [employer].    Nestor Lewandowsky, MD

## 2014-11-07 NOTE — Progress Notes (Signed)
Notified Dr. Genevive Bi of chest pain different than previous pain. VSS pt sinus tach

## 2014-11-07 NOTE — Progress Notes (Signed)
Dr. Genevive Bi ordered another chest x-ray

## 2014-11-07 NOTE — Consult Note (Signed)
[facility] [place] NAME: [name]    MR#:  [id number]  DATE OF BIRTH:  1940-07-22  DATE OF ADMISSION:  11/04/2014  PRIMARY CARE PHYSICIAN: Margarita Rana, MD   CONSULT REQUESTING/REFERRING PHYSICIAN: Nestor Lewandowsky MD  REASON FOR CONSULT : Chest pain, shortness of breath  CHIEF COMPLAINT:   Chief Complaint  Patient presents with  . Shortness of Breath    HISTORY OF PRESENT ILLNESS:  [name]  is a 74 y.o. male with a known history of sinus and pulmonary issues was admitted to [facility] to emergency department for right-sided pneumothorax on 11/04/2014. Patient had bronchoscopy with lung biopsy on 11/02/2014. Felt normal after the procedure but 2 days later had acute onset of midsternal chest pain which is poorly along with significant shortness of breath and presented to the emergency room. Here he was found to have a large right-sided pneumothorax had a chest tube placed emergently by Dr. Leanora Cover. Chest tube was removed earlier today. Patient feels more short of breath after the chest tube was removed. Presently a stat chest x-ray ordered by Dr. Faith Rogue is pending. His chest pain has not changed over the last 3 days since admission. Improves with pain medications.  At baseline patient does not read oxygen, Raquel Sarna 10 his own, most an exercise program 3 days a week. Has been treated for recurrent pneumonia.  On reviewing records from [facility] by his pulmonologist Dr. Lowella Fairy. His lung biopsy is showing possibly cryptogenic organizing pneumonia. Plan is to start him on prednisone taper and Bactrim 1 week after discharge.  EKG shows normal sinus rhythm no acute changes.  PAST MEDICAL HISTORY:   Past Medical History  Diagnosis Date  . Glaucoma   . Hypertension   . AB (asthmatic bronchitis)   . Cellulitis   . Hx MRSA infection   . Insomnia   . Upper back strain   . Foreign body in skin   .  Osteoarthrosis   . Mitral valve disorder   . Arthropathy   . Asthma   . Allergic rhinitis   . Insomnia   . Hx MRSA infection   . Cancer     PAST SURGICAL HISTORY:   Past Surgical History  Procedure Laterality Date  . Appendectomy    . Nasal sinus surgery      SOCIAL HISTORY:   History  Substance Use Topics  . Smoking status: Former Smoker -- 0.50 packs/day for 3 years    Types: Cigarettes    Quit date: 06/17/1958  . Smokeless tobacco: Never Used     Comment: smoking while in college  . Alcohol Use: Yes     Comment: OCCASSIONAL    FAMILY HISTORY:   Family History  Problem Relation Age of Onset  . Breast cancer Sister   . Heart failure Mother   . Cancer Sister     pancreas    DRUG ALLERGIES:   Allergies  Allergen Reactions  . Timolol Maleate [Timolol Maleate] Shortness Of Breath  . Budesonide-Formoterol Fumarate Other (See Comments)    Patient reports dizziness and trembling/shaky feeling.  . Lisinopril     cough    REVIEW OF SYSTEMS:   Review of Systems  Constitutional: Positive for malaise/fatigue. Negative for fever and chills.  HENT: Negative for nosebleeds and sore throat.   Eyes: Negative for blurred vision, double vision and pain.  Respiratory: Positive for cough and shortness of breath. Negative for hemoptysis  and wheezing.   Cardiovascular: Positive for chest pain. Negative for palpitations, orthopnea and leg swelling.  Gastrointestinal: Negative for heartburn, nausea, vomiting, abdominal pain, diarrhea and constipation.  Genitourinary: Negative for dysuria and hematuria.  Musculoskeletal: Negative for back pain and joint pain.  Skin: Negative for rash.  Neurological: Negative for sensory change, speech change, focal weakness and headaches.  Endo/Heme/Allergies: Does not bruise/bleed easily.  Psychiatric/Behavioral: Negative for depression. The patient is not nervous/anxious.     MEDICATIONS AT HOME:   Prior to Admission medications    Medication Sig Start Date End Date Taking? Authorizing Provider  albuterol (PROVENTIL) (2.5 MG/3ML) 0.083% nebulizer solution Take 3 mLs (2.5 mg total) by nebulization every 6 (six) hours. 07/20/14  Yes Juanito Doom, MD  aspirin 81 MG tablet Take 81 mg by mouth daily.   Yes Historical Provider, MD  azelastine (ASTELIN) 0.1 % nasal spray Place 2 sprays into both nostrils 2 (two) times daily. Use in each nostril as directed   Yes Historical Provider, MD  beclomethasone (QVAR) 80 MCG/ACT inhaler Inhale 4 puffs into the lungs 2 (two) times daily. 07/20/14  Yes Juanito Doom, MD  brimonidine (ALPHAGAN) 0.15 % ophthalmic solution Place 1 drop into both eyes 2 (two) times daily.    Yes Historical Provider, MD  brimonidine (ALPHAGAN) 0.15 % ophthalmic solution ALPHAGAN P, 0.15% (Ophthalmic Solution)  1 drop each eye twice a day for 0 days  Quantity: 0.00;  Refills: 0   Ordered :11-Apr-2010  Darlin Priestly ;  Started 24-Aug-2009 Active Comments: DX: 365.9 08/24/09  Yes Historical Provider, MD  calcium carbonate (OS-CAL) 600 MG TABS Take 600 mg by mouth daily.    Yes Historical Provider, MD  cetirizine (ZYRTEC) 10 MG tablet Take 10 mg by mouth daily.   Yes Historical Provider, MD  fluticasone (FLONASE) 50 MCG/ACT nasal spray Place 1 spray into both nostrils 2 (two) times daily.   Yes Historical Provider, MD  latanoprost (XALATAN) 0.005 % ophthalmic solution Place 1 drop into both eyes daily.   Yes Historical Provider, MD  Multiple Vitamin (MULTIVITAMIN) tablet Take 1 tablet by mouth daily.   Yes Historical Provider, MD  predniSONE (DELTASONE) 20 MG tablet Take 1 tablet (20 mg total) by mouth daily with breakfast. Patient not taking: Reported on 11/04/2014 10/05/14 01/04/15  Juanito Doom, MD  Respiratory Therapy Supplies (FLUTTER) DEVI Use 2-4 times daily 06/06/14   Juanito Doom, MD      VITAL SIGNS:  Blood pressure 135/76, pulse 98, temperature 98.6 F (37 C), temperature source Oral, resp. rate  18, height 6\' 2"  (1.88 m), weight 78.926 kg (174 lb), SpO2 93 %.  PHYSICAL EXAMINATION:  Physical Exam  GENERAL:  74 y.o.-year-old patient lying in the bed with some distress from pain. EYES: Pupils equal, round, reactive to light and accommodation. No scleral icterus. Extraocular muscles intact.  HEENT: Head atraumatic, normocephalic. Oropharynx and nasopharynx clear. No oropharyngeal erythema, moist oral mucosa  NECK:  Supple, no jugular venous distention. No thyroid enlargement, no tenderness.  LUNGS: Bilateral crackles. Decreased breath sounds bilaterally. Chest tube site clean.  CARDIOVASCULAR: S1, S2 normal. No murmurs, rubs, or gallops.  ABDOMEN: Soft, nontender, nondistended. Bowel sounds present. No organomegaly or mass.  EXTREMITIES: No pedal edema, cyanosis, or clubbing. + 2 pedal & radial pulses b/l.   NEUROLOGIC: Cranial nerves II through XII are intact. No focal Motor or sensory deficits appreciated b/l PSYCHIATRIC: The patient is alert and oriented x 3. Good affect.  SKIN: No  obvious rash, lesion, or ulcer.   LABORATORY PANEL:   CBC  Recent Labs Lab 11/04/14 0556  WBC 12.6*  HGB 15.6  HCT 46.2  PLT 198   ------------------------------------------------------------------------------------------------------------------  Chemistries   Recent Labs Lab 11/04/14 0556 11/07/14 0454  NA 139  --   K 4.2  --   CL 105  --   CO2 26  --   GLUCOSE 117*  --   BUN 13  --   CREATININE 1.23 1.03  CALCIUM 8.7*  --    ------------------------------------------------------------------------------------------------------------------  Cardiac Enzymes  Recent Labs Lab 11/04/14 0556  TROPONINI <0.03   ------------------------------------------------------------------------------------------------------------------  RADIOLOGY:  Dg Chest 2v Repeat Same Day  11/07/2014   CLINICAL DATA:  Pneumothorax.  EXAM: CHEST - 2 VIEW SAME DAY  COMPARISON:  11/06/2014, 11/04/2014,  08/22/2014.  FINDINGS: Right chest tube in stable position. Tiny right apical pneumothorax cannot be excluded on today's exam. Mediastinum hilar structures are normal. Stable cardiomegaly. Bibasilar atelectasis and/or infiltrates with small right pleural effusion noted.  IMPRESSION: 1. Right chest tube in stable position. 2. Tiny right apical pneumothorax cannot be excluded on today's exam. 3. Bibasilar subsegmental atelectasis again noted. Small right pleural effusion cannot be excluded. Critical Value/emergent results were called by telephone at the time of interpretation on 11/07/2014 at 7:48 am to nurse Abby, who verbally acknowledged these results.   Electronically Signed   By: Marcello Moores  Register   On: 11/07/2014 07:50   Dg Chest 1v Repeat Same Day  11/07/2014   CLINICAL DATA:  Followup pneumothorax.  EXAM: CHEST - 1 VIEW SAME DAY  COMPARISON:  11/07/2014  FINDINGS: The small caliber right-sided chest tube is stable. No pneumothorax is identified. Persistent right basilar atelectasis and small effusion. The left lung remains relatively clear. Minimal streaky left basilar atelectasis.  IMPRESSION: Stable small caliber chest tube on the right with resolution of pneumothorax.  Persistent small right effusion and overlying atelectasis.   Electronically Signed   By: Marijo Sanes M.D.   On: 11/07/2014 15:35   Dg Chest Port 1 View  11/06/2014   CLINICAL DATA:  Followup right chest tube and possible tiny right basilar pneumothorax.  EXAM: PORTABLE CHEST - 1 VIEW  COMPARISON:  11/04/2014.  FINDINGS: A small bore right chest tube remains in place. No pneumothorax seen today. Decreased right basilar atelectasis. Mild left basilar atelectasis without significant overall change. Borderline enlarged cardiac silhouette. Unremarkable bones.  IMPRESSION: 1. No pneumothorax. 2. Decreased right basilar atelectasis and stable mild left basilar atelectasis.   Electronically Signed   By: Claudie Revering M.D.   On: 11/06/2014 09:56      IMPRESSION AND PLAN:   1. Right pneumothorax - Presently his chest tube has been removed. His chest pain started with the pneumothorax. Hasnt improved. Could take some more time. Would use pain meds PRN. Normal EKG. Repeat STAT CXR has been ordered. Agree.  2. Cryptogenic organizing pneumonia. This is the preliminary diagnosis at [facility] from patient's recent lung biopsy. Plan is to start him on a prednisone taper along with Bactrim 1 week after discharge.  3. Acute respiratory failure. Due to pneumothorax and cryptogenic organizing pneumonia. Wean oxygen as possible.  4. DVT prophylaxis. Patient is on Lovenox.  Plan of care and tests discussed with patient and family at bedside.   All the records are reviewed and case discussed with ED provider. Management plans discussed with the patient, family and they are in agreement.  CODE STATUS : FULL CODE  TOTAL TIME  TAKING CARE OF THIS PATIENT FOR THIS CONSULT: 40 minutes.    Hillary Bow R M.D on 11/07/2014 at 6:08 PM  Between 7am to 6pm - Pager - [phone]  After 6pm go to [email or web address] - password [email or web address] [facility]  Office  [phone]  CC: Primary care physician; Margarita Rana, MD

## 2014-11-08 MED ORDER — POLYETHYLENE GLYCOL 3350 17 G PO PACK
17.0000 g | PACK | Freq: Two times a day (BID) | ORAL | Status: DC
  Filled 2014-11-08: qty 1

## 2014-11-08 MED ORDER — BISACODYL 10 MG RE SUPP
10.0000 mg | Freq: Every day | RECTAL | Status: DC
  Administered 2014-11-08: 10 mg via RECTAL
  Filled 2014-11-08: qty 1

## 2014-11-08 NOTE — Progress Notes (Signed)
Patient ID: [name], male   DOB: [birth year], 74 y.o.   MRN: [id number]  HISTORY: He still feels like he is having "trachea pain" which he describes as pain just the right of midline.  Less pain today than yesterday.  Not short of breath.  Good apetite.  No bowel movement.  Oxygen sats are greater than 92 % over the last 24 hours.     Filed Vitals:   11/08/14 0808  BP: 131/59  Pulse: 102  Temp: 99.1 F (37.3 C)  Resp: 17   Wt Readings from Last 3 Encounters:  11/04/14 78.926 kg (174 lb)  08/12/14 78.926 kg (174 lb)  10/05/14 78.472 kg (173 lb)    EXAM: Head: Normocephalic and atraumatic.  Eyes:  Conjunctivae are normal. No scleral icterus.  Resp: Lungs are clear bilaterally.  No respiratory distress, normal effort. Heart:  Regular without murmurs Abd:  Abdomen is soft, non distended and non tender. No masses are palpable.  There is no rebound and no guarding.  Neurological: Alert and oriented to person, place, and time. Coordination normal.      ASSESSMENT: Appreciate input from our hospitalists.  Still no obvious reason for his pain.  CXRay shows a small right pleural effusion but no pneumothorax   PLAN:   We will wean oxygen and encourage ambulation Will discontinue IV morphine and try only oral pain meds     Nestor Lewandowsky, MD

## 2014-11-08 NOTE — Care Management Note (Signed)
Case Management Note  Patient Details  Name: [name] MRN: [id number] Date of Birth: [birth year]  Subjective/Objective:                   Spoke with patient and family concerning discharge planning. Patient stated that he is normally independent and that he drives self, mows yard, and is also a Insurance underwriter. Very active and independent per family. Patient stated that his onlyconcern at this time was pain from surgery. Patient is on O2 Here but does not use at home. No Home Health needs anticipated. May need home O2 if cannot wean. Action/Plan:   Expected Discharge Date:  11/06/14               Expected Discharge Plan:  Home/Self Care  In-House Referral:     Discharge planning Services  CM Consult  Post Acute Care Choice:    Choice offered to:  NA  DME Arranged:    DME Agency:  NA  HH Arranged:  NA HH Agency:     Status of Service:  Completed, signed off  Medicare Important Message Given:    Date Medicare IM Given:    Medicare IM give by:    Date Additional Medicare IM Given:    Additional Medicare Important Message give by:     If discussed at [place] of Stay Meetings, dates discussed:    Additional Comments:  Alvie Heidelberg, RN 11/08/2014, 2:41 PM

## 2014-11-08 NOTE — Progress Notes (Signed)
[facility] [place] consult follow-up   PATIENT NAME: [name]    MR#:  [id number]  DATE OF BIRTH:  1941-02-26  SUBJECTIVE:   Feeling much better. Chest pain improving. Not short of breath, but does feel unable to take a deep breath  REVIEW OF SYSTEMS:   Review of Systems  Constitutional: Negative for fever.  Respiratory: Negative for shortness of breath.   Cardiovascular: Negative for chest pain and palpitations.  Gastrointestinal: Negative for nausea, vomiting and abdominal pain.  Genitourinary: Negative for dysuria.    DRUG ALLERGIES:   Allergies  Allergen Reactions  . Timolol Maleate [Timolol Maleate] Shortness Of Breath  . Budesonide-Formoterol Fumarate Other (See Comments)    Patient reports dizziness and trembling/shaky feeling.  . Lisinopril     cough    VITALS:  Blood pressure 131/59, pulse 98, temperature 99.1 F (37.3 C), temperature source Oral, resp. rate 17, height 6\' 2"  (1.88 m), weight 78.926 kg (174 lb), SpO2 95 %.  PHYSICAL EXAMINATION:  GENERAL:  74 y.o.-year-old patient sitting up, fairly stiff, no acute distress thin  EYES: Pupils equal, round, reactive to light and accommodation. No scleral icterus. Extraocular muscles intact.  HEENT: Head atraumatic, normocephalic. Oropharynx and nasopharynx clear.  NECK:  Supple, no jugular venous distention. No thyroid enlargement, no tenderness.  LUNGS: Normal breath sounds bilaterally, no wheezing, rales,rhonchi or crepitation. No use of accessory muscles of respiration.  CARDIOVASCULAR: S1, S2 normal. No murmurs, rubs, or gallops.  ABDOMEN: Soft, nontender, nondistended. Bowel sounds present. No organomegaly or mass.  EXTREMITIES: No pedal edema, cyanosis, or clubbing.  NEUROLOGIC: Cranial nerves II through XII are intact. Muscle strength 5/5 in all extremities. Sensation intact. Gait not checked.  PSYCHIATRIC: The patient is alert and oriented x 3.  SKIN:  No obvious rash, lesion, or ulcer.    LABORATORY PANEL:   CBC  Recent Labs Lab 11/04/14 0556  WBC 12.6*  HGB 15.6  HCT 46.2  PLT 198   ------------------------------------------------------------------------------------------------------------------  Chemistries   Recent Labs Lab 11/04/14 0556 11/07/14 0454  NA 139  --   K 4.2  --   CL 105  --   CO2 26  --   GLUCOSE 117*  --   BUN 13  --   CREATININE 1.23 1.03  CALCIUM 8.7*  --    ------------------------------------------------------------------------------------------------------------------  Cardiac Enzymes  Recent Labs Lab 11/04/14 0556  TROPONINI <0.03   ------------------------------------------------------------------------------------------------------------------  RADIOLOGY:  Dg Chest 2 View  11/07/2014   CLINICAL DATA:  Patient states he had bronchoscopy at [facility] last Wednesday for lung mass, spontaneous pneumothorax since.  EXAM: CHEST  2 VIEW  COMPARISON:  11/07/2014  FINDINGS: Right-sided chest tube is been removed. No pneumothorax. Persistent stable right base opacity with effusion. Mild atelectasis left base stable.  IMPRESSION: Consolidation and effusion right lung base stable. No evidence of recurrent pneumothorax.   Electronically Signed   By: Skipper Cliche M.D.   On: 11/07/2014 18:56   Dg Chest 2v Repeat Same Day  11/07/2014   CLINICAL DATA:  Pneumothorax.  EXAM: CHEST - 2 VIEW SAME DAY  COMPARISON:  11/06/2014, 11/04/2014, 08/22/2014.  FINDINGS: Right chest tube in stable position. Tiny right apical pneumothorax cannot be excluded on today's exam. Mediastinum hilar structures are normal. Stable cardiomegaly. Bibasilar atelectasis and/or infiltrates with small right pleural effusion noted.  IMPRESSION: 1. Right chest tube in stable position. 2. Tiny right apical pneumothorax cannot be excluded on today's exam. 3. Bibasilar subsegmental  atelectasis again noted. Small right pleural effusion cannot be  excluded. Critical Value/emergent results were called by telephone at the time of interpretation on 11/07/2014 at 7:48 am to nurse Abby, who verbally acknowledged these results.   Electronically Signed   By: Marcello Moores  Register   On: 11/07/2014 07:50   Dg Chest 1v Repeat Same Day  11/07/2014   CLINICAL DATA:  Followup pneumothorax.  EXAM: CHEST - 1 VIEW SAME DAY  COMPARISON:  11/07/2014  FINDINGS: The small caliber right-sided chest tube is stable. No pneumothorax is identified. Persistent right basilar atelectasis and small effusion. The left lung remains relatively clear. Minimal streaky left basilar atelectasis.  IMPRESSION: Stable small caliber chest tube on the right with resolution of pneumothorax.  Persistent small right effusion and overlying atelectasis.   Electronically Signed   By: Marijo Sanes M.D.   On: 11/07/2014 15:35    EKG:   Orders placed or performed during the hospital encounter of 11/04/14  . ED EKG (<24mins upon arrival to the ED)  . ED EKG (<56mins upon arrival to the ED)  . EKG 12-Lead  . EKG 12-Lead    ASSESSMENT AND PLAN:    1. Right pneumothorax - Presently his chest tube has been removed. His chest pain started with the pneumothorax. Seems to be improving. He describes a substernal chest pain. Continue with PPI. Chest x-ray shows consolidation and effusion at right lung base. No pneumothorax. No significant fever. Would continue with incentive spirometer. Check a CBC to assess for leukocytosis. I suspect that the consolidation is atelectasis after pneumothorax.  2. Cryptogenic organizing pneumonia. This is the preliminary diagnosis at [facility] from patient's recent lung biopsy. Plan is to start him on a prednisone taper along with Bactrim 1 week after discharge.  3. Acute respiratory failure. Due to pneumothorax and cryptogenic organizing pneumonia. Wean oxygen as possible. He has been up and walking around comfortably. Currently on 2 L with sats in the mid 90s  4. DVT  prophylaxis. Patient is on Lovenox.   All the records are reviewed and case discussed with Care Management/Social Workerr. Management plans discussed with the patient, family and they are in agreement.  CODE STATUS: Full   TOTAL TIME TAKING CARE OF THIS PATIENT: 25 minutes.   POSSIBLE D/C IN 1 DAYS, DEPENDING ON CLINICAL CONDITION.   Myrtis Ser M.D on 11/08/2014 at 1:32 PM  Between 7am to 6pm - Pager - [phone]  After 6pm go to [email or web address] - password [email or web address] [facility]  Office  [phone]  CC: Primary care physician; Margarita Rana, MD

## 2014-11-09 LAB — CBC
HEMATOCRIT: 42.3 % (ref 40.0–52.0)
Hemoglobin: 14.1 g/dL (ref 13.0–18.0)
MCH: 32.8 pg (ref 26.0–34.0)
MCHC: 33.4 g/dL (ref 32.0–36.0)
MCV: 98.1 fL (ref 80.0–100.0)
Platelets: 231 10*3/uL (ref 150–440)
RBC: 4.31 MIL/uL — ABNORMAL LOW (ref 4.40–5.90)
RDW: 13.1 % (ref 11.5–14.5)
WBC: 9.3 10*3/uL (ref 3.8–10.6)

## 2014-11-09 MED ORDER — HYDROCODONE-ACETAMINOPHEN 5-325 MG PO TABS: 1.0000 | ORAL_TABLET | ORAL | Status: DC | PRN

## 2014-11-09 MED ORDER — FAMOTIDINE 20 MG PO TABS: 20.0000 mg | ORAL_TABLET | Freq: Two times a day (BID) | ORAL | Status: DC

## 2014-11-09 NOTE — Progress Notes (Signed)
A&O. VSS. Tolerating diet well. No complaints of pain or nausea. Right flank dressing dry and intact. Resting comfortably. Ambulating unassisted with steady gait. Discharged per MD orders. Discharge instructions reviewed with pt and pt verbalized understanding. IV removed per policy. Prescriptions given to pt via Dr. Dierdre Searles via wheelchair escorted by auxilary.

## 2014-11-09 NOTE — Discharge Summary (Signed)
Physician Discharge Summary  Patient ID: [name] MRN: [id number] DOB/AGE: 1941-02-27 74 y.o.  Admit date: 11/04/2014 Discharge date: 11/09/2014  Admission Diagnoses:  Iatrogenic pneumothorax right side  Discharge Diagnoses:  Active Problems:   Pneumothorax, right   Discharged Condition: good  Hospital Course: Admitted with right iatrogenic pneumothorax managed with chest tube which was removed 3 days later.  Chest pain felt secondary to GERD.  Managed with Pepcid and did well  Consults: None      Discharge Exam: Blood pressure 126/67, pulse 87, temperature 98.3 F (36.8 C), temperature source Oral, resp. rate 20, height 6\' 2"  (1.88 m), weight 78.926 kg (174 lb), SpO2 94 %. Resp: rhonchi bilaterally  Disposition:   Discharge Instructions    Diet - low sodium heart healthy    Complete by:  As directed      Discharge instructions    Complete by:  As directed   Followup at [facility] as per [facility] doctors.  Call [facility] for shortness of breath, fever, chills or chest pain     Increase activity slowly    Complete by:  As directed             Medication List    STOP taking these medications        albuterol (2.5 MG/3ML) 0.083% nebulizer solution  Commonly known as:  PROVENTIL     beclomethasone 80 MCG/ACT inhaler  Commonly known as:  QVAR     FLUTTER Devi     predniSONE 20 MG tablet  Commonly known as:  DELTASONE      TAKE these medications        aspirin 81 MG tablet  Take 81 mg by mouth daily.     azelastine 0.1 % nasal spray  Commonly known as:  ASTELIN  Place 2 sprays into both nostrils 2 (two) times daily. Use in each nostril as directed     brimonidine 0.15 % ophthalmic solution  Commonly known as:  ALPHAGAN  Place 1 drop into both eyes 2 (two) times daily.     brimonidine 0.15 % ophthalmic solution  Commonly known as:  ALPHAGAN  ALPHAGAN P, 0.15% (Ophthalmic Solution)  1 drop each eye twice a day for 0 days  Quantity: 0.00;  Refills: 0   Ordered  :11-Apr-2010  Darlin Priestly ;  Started 24-Aug-2009 Active Comments: DX: 365.9     calcium carbonate 600 MG Tabs tablet  Commonly known as:  OS-CAL  Take 600 mg by mouth daily.     cetirizine 10 MG tablet  Commonly known as:  ZYRTEC  Take 10 mg by mouth daily.     famotidine 20 MG tablet  Commonly known as:  PEPCID  Take 1 tablet (20 mg total) by mouth 2 (two) times daily.     fluticasone 50 MCG/ACT nasal spray  Commonly known as:  FLONASE  Place 1 spray into both nostrils 2 (two) times daily.     HYDROcodone-acetaminophen 5-325 MG per tablet  Commonly known as:  NORCO/VICODIN  Take 1 tablet by mouth every 4 (four) hours as needed for moderate pain.     latanoprost 0.005 % ophthalmic solution  Commonly known as:  XALATAN  Place 1 drop into both eyes daily.     multivitamin tablet  Take 1 tablet by mouth daily.         SignedGenevive Bi, Edilia Ghuman 11/09/2014, 10:07 AM

## 2014-11-09 NOTE — Care Management (Signed)
Patient for discharge today, O2 saturations are WNL on RA. Patient has good family support and independent. No CM needs identified at time of discharge.

## 2014-11-09 NOTE — Progress Notes (Signed)
[facility] consult follow-up   PATIENT NAME: [name]    MR#:  [id number]  DATE OF BIRTH:  [birth year]  SUBJECTIVE:   Feels better this morning. Minor pain overnight, but slept well. Still splinting with shallow breaths.  REVIEW OF SYSTEMS:   Review of Systems  Constitutional: Negative for fever and chills.  Respiratory: Positive for shortness of breath. Negative for cough, hemoptysis and wheezing.   Cardiovascular: Negative for chest pain and palpitations.  Gastrointestinal: Negative for nausea, vomiting and abdominal pain.  Genitourinary: Negative for dysuria.    DRUG ALLERGIES:   Allergies  Allergen Reactions  . Timolol Maleate [Timolol Maleate] Shortness Of Breath  . Budesonide-Formoterol Fumarate Other (See Comments)    Patient reports dizziness and trembling/shaky feeling.  . Lisinopril     cough    VITALS:  Blood pressure 126/67, pulse 87, temperature 98.3 F (36.8 C), temperature source Oral, resp. rate 20, height 6\' 2"  (1.88 m), weight 78.926 kg (174 lb), SpO2 94 %.  PHYSICAL EXAMINATION:  GENERAL:  74 y.o.-year-old patient sitting up, fairly stiff, no acute distress thin  EYES: Pupils equal, round, reactive to light and accommodation. No scleral icterus. Extraocular muscles intact.  HEENT: Head atraumatic, normocephalic. Oropharynx and nasopharynx clear.  NECK:  Supple, no jugular venous distention. No thyroid enlargement, no tenderness.  LUNGS: decrease BS at bases, no wheezing, rales, rhonchi or crepitation. No use of accessory muscles of respiration. Shallow resps CARDIOVASCULAR: S1, S2 normal. No murmurs, rubs, or gallops.  ABDOMEN: Soft, nontender, nondistended. Bowel sounds present. No organomegaly or mass.  EXTREMITIES: No pedal edema, cyanosis, or clubbing.  NEUROLOGIC: Cranial nerves II through XII are intact. Muscle strength 5/5 in all extremities. Sensation intact. Gait not checked.   PSYCHIATRIC: The patient is alert and oriented x 3.  SKIN: No obvious rash, lesion, or ulcer.    LABORATORY PANEL:   CBC  Recent Labs Lab 11/09/14 0429  WBC 9.3  HGB 14.1  HCT 42.3  PLT 231   ------------------------------------------------------------------------------------------------------------------  Chemistries   Recent Labs Lab 11/04/14 0556 11/07/14 0454  NA 139  --   K 4.2  --   CL 105  --   CO2 26  --   GLUCOSE 117*  --   BUN 13  --   CREATININE 1.23 1.03  CALCIUM 8.7*  --    ------------------------------------------------------------------------------------------------------------------  Cardiac Enzymes  Recent Labs Lab 11/04/14 0556  TROPONINI <0.03   ------------------------------------------------------------------------------------------------------------------  RADIOLOGY:  Dg Chest 2 View  11/07/2014   CLINICAL DATA:  Patient states he had bronchoscopy at [facility] last Wednesday for lung mass, spontaneous pneumothorax since.  EXAM: CHEST  2 VIEW  COMPARISON:  11/07/2014  FINDINGS: Right-sided chest tube is been removed. No pneumothorax. Persistent stable right base opacity with effusion. Mild atelectasis left base stable.  IMPRESSION: Consolidation and effusion right lung base stable. No evidence of recurrent pneumothorax.   Electronically Signed   By: Skipper Cliche M.D.   On: 11/07/2014 18:56   Dg Chest 1v Repeat Same Day  11/07/2014   CLINICAL DATA:  Followup pneumothorax.  EXAM: CHEST - 1 VIEW SAME DAY  COMPARISON:  11/07/2014  FINDINGS: The small caliber right-sided chest tube is stable. No pneumothorax is identified. Persistent right basilar atelectasis and small effusion. The left lung remains relatively clear. Minimal streaky left basilar atelectasis.  IMPRESSION: Stable small caliber chest tube on the right with resolution of pneumothorax.  Persistent small right effusion and overlying  atelectasis.   Electronically Signed   By: Marijo Sanes  M.D.   On: 11/07/2014 15:35    EKG:   Orders placed or performed during the hospital encounter of 11/04/14  . ED EKG (<39mins upon arrival to the ED)  . ED EKG (<75mins upon arrival to the ED)  . EKG 12-Lead  . EKG 12-Lead    ASSESSMENT AND PLAN:    1. Right pneumothorax - Management per Dr. Genevive Bi.  Chest tube has been removed.Chest x-ray shows consolidation and effusion at right lung base. No pneumothorax. No significant fever. Would continue with incentive spirometer. Check ambulator sats to be sure of no home 02 requirement.  2. Cryptogenic organizing pneumonia. This is the preliminary diagnosis at [facility] from patient's recent lung biopsy. Plan is to start him on a prednisone taper along with Bactrim 1 week after discharge. He will follow up with his pulmonologist after discharge  3. Acute respiratory failure. Due to pneumothorax and cryptogenic organizing pneumonia. Weaning well from 02, sats mid 90% on RA. Check ambulatory sats  4. DVT prophylaxis. Patient is on Lovenox.  5. GERD: suspect that this was the cause of epigastric chest pain. Has done well with pepcid and tums and will continue after dc.  6. Constipation: miralax  Dispo: OK for dc from medical stand point.  All the records are reviewed and case discussed with Care Management/Social Workerr. Management plans discussed with the patient, family and they are in agreement.  CODE STATUS: Full   TOTAL TIME TAKING CARE OF THIS PATIENT: 25 minutes.   POSSIBLE D/C IN 1 DAYS, DEPENDING ON CLINICAL CONDITION.   Myrtis Ser M.D on 11/09/2014 at 10:06 AM  Between 7am to 6pm - Pager - [phone]  After 6pm go to [email or web address] - password [email or web address] [facility]  Office  [phone]  CC: Primary care physician; Margarita Rana, MD

## 2014-12-21 ENCOUNTER — Ambulatory Visit (INDEPENDENT_AMBULATORY_CARE_PROVIDER_SITE_OTHER): Payer: PPO | Admitting: Family Medicine

## 2014-12-21 ENCOUNTER — Encounter: Payer: Self-pay | Admitting: Family Medicine

## 2014-12-21 VITALS — BP 142/82 | HR 88 | Temp 98.1°F | Resp 16 | Ht 73.5 in | Wt 171.0 lb

## 2014-12-21 DIAGNOSIS — R252 Cramp and spasm: Secondary | ICD-10-CM | POA: Diagnosis not present

## 2014-12-21 DIAGNOSIS — I479 Paroxysmal tachycardia, unspecified: Secondary | ICD-10-CM | POA: Diagnosis not present

## 2014-12-21 DIAGNOSIS — Z0000 Encounter for general adult medical examination without abnormal findings: Secondary | ICD-10-CM | POA: Diagnosis not present

## 2014-12-21 DIAGNOSIS — Z125 Encounter for screening for malignant neoplasm of prostate: Secondary | ICD-10-CM

## 2014-12-21 DIAGNOSIS — J189 Pneumonia, unspecified organism: Secondary | ICD-10-CM | POA: Insufficient documentation

## 2014-12-21 DIAGNOSIS — M25552 Pain in left hip: Secondary | ICD-10-CM

## 2014-12-21 NOTE — Progress Notes (Signed)
Patient ID: [name], male   DOB: 1941/06/01, 74 y.o.   MRN: [id number]        Patient: [name], Male    DOB: 1940/12/15, 74 y.o.   MRN: [id number] Visit Date: 12/21/2014  Today's Provider: Margarita Rana, MD   Chief Complaint  Patient presents with  . Annual Exam   Subjective:    Annual wellness visit [name] is a 73 y.o. male who presents today for his Subsequent Annual Wellness Visit. He feels fairly well. He reports exercising three days a week. He reports he is sleeping poorly, secondary to taking prednisone.    -----------------------------------------------------------  Hip Pain  The incident occurred more than 1 week ago (Pt reports has been going on for about two weeks. ). There was no injury mechanism. The pain is present in the left hip. The pain has been fluctuating since onset.      Review of Systems  Constitutional: Negative.   HENT: Negative.   Eyes: Negative.   Respiratory: Positive for cough.   Cardiovascular: Negative.   Gastrointestinal: Negative.   Endocrine: Negative.   Genitourinary: Negative.   Skin: Negative.   Allergic/Immunologic: Negative.   Neurological: Negative.   Hematological: Negative.   Psychiatric/Behavioral: Negative.     History   Social History  . Marital Status: Married    Spouse Name: Everlene Farrier  . Number of Children: 2  . Years of Education: College   Occupational History  . Retired     Chief Financial Officer   Social History Main Topics  . Smoking status: Former Smoker -- 0.50 packs/day for 3 years    Types: Cigarettes    Quit date: 06/17/1958  . Smokeless tobacco: Never Used     Comment: smoking while in college  . Alcohol Use: Yes     Comment: OCCASSIONAL WINE ONLY  . Drug Use: No  . Sexual Activity: Not on file   Other Topics Concern  . Not on file   Social History Narrative   He is a married father of 2 daughters.   He smoked casually while in college for about 3 years less than a pack a day.   He drinks 3-4  glass of wine at night. He also has about 2 servings of coffee a day   He is a very active gentleman with [employer] exercise program at least 3-4 days a week.   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   In addition to this standing his exercise, he does lots of gardening and uses a chain saw to cut wood.             Patient Active Problem List   Diagnosis Date Noted  . Recurrent pneumonia 12/21/2014  . Pneumothorax, right 11/04/2014  . Basal cell carcinoma 10/19/2014  . Abnormal liver enzymes 10/19/2014  . Personal history of methicillin resistant Staphylococcus aureus 10/19/2014  . Cannot sleep 10/19/2014  . Heel pain 10/19/2014  . Pain in shoulder 10/19/2014  . Back strain 10/19/2014  . Pulmonary aspergillosis, allergic bronchopulmonary type 07/30/2014  . ABPA (allergic bronchopulmonary aspergillosis) 07/30/2014  . CAP (community acquired pneumonia) 05/03/2014  . Abnormal CXR 04/01/2014  . Paroxysmal tachycardia 06/23/2013  . Asymptomatic PVCs 06/23/2013  . Beat, premature ventricular 06/23/2013  . Cough 10/19/2012  . Allergic rhinitis 01/17/2009  . Asthma, chronic obstructive, without status asthmaticus 11/04/2008  . CAFL (chronic airflow limitation) 11/04/2008  . HYPERTENSION 02/09/2008  . Arthropathia 08/28/2007  . Disorder  of mitral valve 07/08/2005  . Benign prostatic hypertrophy without urinary obstruction 05/16/1997  . LBP (low back pain) 05/16/1997  . Degenerative arthritis of hip 03/18/1995    Past Surgical History  Procedure Laterality Date  . Appendectomy    . Nasal sinus surgery  09/27/2014  . Bronchoscopy      His family history includes Alzheimer's disease in his father; Brain cancer in his daughter; Breast cancer in his sister; Heart failure in his mother; Pancreatic cancer in his sister.    Previous Medications   ASPIRIN 81 MG TABLET    Take 81 mg by mouth daily.   BRIMONIDINE (ALPHAGAN) 0.15 % OPHTHALMIC SOLUTION     Place 1 drop into both eyes 2 (two) times daily.    CALCIUM CARBONATE (OS-CAL) 600 MG TABS    Take 600 mg by mouth daily.    FLUTICASONE (FLONASE) 50 MCG/ACT NASAL SPRAY    Place 1 spray into both nostrils 2 (two) times daily.   LATANOPROST (XALATAN) 0.005 % OPHTHALMIC SOLUTION    Place 1 drop into both eyes daily.   MULTIPLE VITAMIN (MULTIVITAMIN) TABLET    Take 1 tablet by mouth daily.   PREDNISONE (DELTASONE) 5 MG TABLET    Take 60 mg by mouth daily x 14 days, followed by 55 mg x 14 days, followed by a taper of 5mg  every 2 weeks.   SULFAMETHOXAZOLE-TRIMETHOPRIM (BACTRIM,SEPTRA) 400-80 MG PER TABLET    Take by mouth.   TRAZODONE (DESYREL) 50 MG TABLET    Take 25 mg by mouth.   VORICONAZOLE (VFEND) 200 MG TABLET    Take by mouth.    Patient Care Team: Margarita Rana, MD as PCP - General (Family Medicine)     Objective:   Vitals: BP 142/82 mmHg  Pulse 88  Temp(Src) 98.1 F (36.7 C) (Oral)  Resp 16  Ht 6' 1.5" (1.867 m)  Wt 171 lb (77.565 kg)  BMI 22.25 kg/m2  Physical Exam  Constitutional: He is oriented to person, place, and time. He appears well-developed and well-nourished.  HENT:  Head: Normocephalic.  Right Ear: Hearing, tympanic membrane, external ear and ear canal normal.  Left Ear: Hearing, tympanic membrane, external ear and ear canal normal.  Nose: Nose normal.  Mouth/Throat: Uvula is midline, oropharynx is clear and moist and mucous membranes are normal.  Eyes: Conjunctivae, EOM and lids are normal. Pupils are equal, round, and reactive to light.  Neck: Trachea normal and normal range of motion. Carotid bruit is not present.  Cardiovascular: Normal rate, normal heart sounds and normal pulses.  An irregular rhythm present.  Pulmonary/Chest: Effort normal and breath sounds normal.  Abdominal: Soft. Normal appearance and normal aorta.  Musculoskeletal:  Tender over left mid-buttocks.   Neurological: He is alert and oriented to person, place, and time.  Skin: Skin is  warm, dry and intact.  Psychiatric: He has a normal mood and affect. His speech is normal. Judgment normal. Cognition and memory are normal.    Activities of Daily Living In your present state of health, do you have any difficulty performing the following activities: 12/21/2014 11/04/2014  Hearing? N N  Vision? N N  Difficulty concentrating or making decisions? N N  Walking or climbing stairs? N N  Dressing or bathing? N N  Doing errands, shopping? N N    Fall Risk Assessment Fall Risk  12/21/2014  Falls in the past year? No     Depression Screen PHQ 2/9 Scores 12/21/2014  PHQ - 2 Score  0    Cognitive Testing - 6-CIT  Correct? Score   What year is it? yes 0 0 or 4  What month is it? yes 0 0 or 3  Memorize:    [place], [place],  [birth year],  [place],      What time is it? (within 1 hour) yes 0 0 or 3  Count backwards from 20 yes 0 0, 2, or 4  Name the months of the year yes 0 0, 2, or 4  Repeat name & address above no 3 0, 2, 4, 6, 8, or 10       TOTAL SCORE  3/28   Interpretation:  Normal  Normal (0-7) Abnormal (8-28)       Assessment & Plan:     Annual Wellness Visit  Reviewed patient's Family Medical History Reviewed and updated list of patient's medical providers Assessment of cognitive impairment was done Assessed patient's functional ability Established a written schedule for health screening services Health Risk Assessment Completed and Reviewed  2. Left hip pain Really piriformis pain.  Will stop particular exercises for now and restart when improved.    3. Muscle cramps Will check labs.  - Magnesium - Comprehensive metabolic panel  4. Screening for prostate cancer Check labs.  - PSA  5. Paroxysmal tachycardia Check labs.  - TSH     Exercise Activities and Dietary recommendations Goals    None      Immunization History  Administered Date(s) Administered  . Influenza Split 03/02/2012, 03/17/2013, 05/07/2014  . Influenza Whole 03/24/2007,  03/15/2009, 02/15/2010  . Pneumococcal Polysaccharide-23 03/19/2007, 03/17/2009    Health Maintenance  Topic Date Due  . TETANUS/TDAP  09/16/1957  . ZOSTAVAX  09/17/1998  . PNA vac Low Risk Adult (2 of 2 - PCV13) 03/17/2010  . INFLUENZA VACCINE  01/16/2015  . COLONOSCOPY  02/14/2016      Discussed health benefits of physical activity, and encouraged him to engage in regular exercise appropriate for his age and condition.    ------------------------------------------------------------------------------------------------------------  Patient was seen and examined by Jerrell Belfast, MD, and note scribed by Ashley Royalty, [place].   I have reviewed the document for accuracy and completeness and I agree with above. Jerrell Belfast, MD     Margarita Rana, MD

## 2014-12-22 LAB — COMPREHENSIVE METABOLIC PANEL
ALBUMIN: 4.1 g/dL (ref 3.5–4.8)
ALK PHOS: 64 IU/L (ref 39–117)
ALT: 40 IU/L (ref 0–44)
AST: 23 IU/L (ref 0–40)
Albumin/Globulin Ratio: 1.6 (ref 1.1–2.5)
BUN / CREAT RATIO: 11 (ref 10–22)
BUN: 13 mg/dL (ref 8–27)
Bilirubin Total: 0.5 mg/dL (ref 0.0–1.2)
CHLORIDE: 96 mmol/L — AB (ref 97–108)
CO2: 25 mmol/L (ref 18–29)
CREATININE: 1.2 mg/dL (ref 0.76–1.27)
Calcium: 9.5 mg/dL (ref 8.6–10.2)
GFR calc Af Amer: 67 mL/min/{1.73_m2} (ref >59)
GFR calc non Af Amer: 58 mL/min/{1.73_m2} — ABNORMAL LOW (ref >59)
GLOBULIN, TOTAL: 2.5 g/dL (ref 1.5–4.5)
Glucose: 110 mg/dL — ABNORMAL HIGH (ref 65–99)
Potassium: 5.6 mmol/L — ABNORMAL HIGH (ref 3.5–5.2)
SODIUM: 136 mmol/L (ref 134–144)
Total Protein: 6.6 g/dL (ref 6.0–8.5)

## 2014-12-22 LAB — CBC WITH DIFFERENTIAL/PLATELET
BASOS ABS: 0 10*3/uL (ref 0.0–0.2)
Basos: 0 %
EOS (ABSOLUTE): 0 10*3/uL (ref 0.0–0.4)
EOS: 0 %
HEMATOCRIT: 50.2 % (ref 37.5–51.0)
Hemoglobin: 17.2 g/dL (ref 12.6–17.7)
IMMATURE GRANS (ABS): 0 10*3/uL (ref 0.0–0.1)
IMMATURE GRANULOCYTES: 0 %
LYMPHS ABS: 0.7 10*3/uL (ref 0.7–3.1)
Lymphs: 5 %
MCH: 33 pg (ref 26.6–33.0)
MCHC: 34.3 g/dL (ref 31.5–35.7)
MCV: 96 fL (ref 79–97)
MONOS ABS: 0.2 10*3/uL (ref 0.1–0.9)
Monocytes: 2 %
Neutrophils Absolute: 12.3 10*3/uL — ABNORMAL HIGH (ref 1.4–7.0)
Neutrophils: 93 %
Platelets: 259 10*3/uL (ref 150–379)
RBC: 5.21 x10E6/uL (ref 4.14–5.80)
RDW: 14.9 % (ref 12.3–15.4)
WBC: 13.3 10*3/uL — ABNORMAL HIGH (ref 3.4–10.8)

## 2014-12-22 LAB — FERRITIN: Ferritin: 1961 ng/mL — ABNORMAL HIGH (ref 30–400)

## 2014-12-22 LAB — TSH: TSH: 0.898 u[IU]/mL (ref 0.450–4.500)

## 2014-12-22 LAB — PSA: PROSTATE SPECIFIC AG, SERUM: 2.6 ng/mL (ref 0.0–4.0)

## 2014-12-22 LAB — MAGNESIUM: Magnesium: 2.3 mg/dL (ref 1.6–2.3)

## 2014-12-23 ENCOUNTER — Telehealth: Payer: Self-pay

## 2014-12-23 NOTE — Telephone Encounter (Signed)
-----   Message from Margarita Rana, MD sent at 12/23/2014  1:06 PM EDT ----- Labs stable except for mildly elevated potassium.  Make sure not taking any potassium supplements or salt substitutes or sports drinks.  Magnesium normal. Recheck next week. Thanks.

## 2014-12-23 NOTE — Telephone Encounter (Signed)
Pt advised as directed below.   Thanks,   -Laura  

## 2014-12-28 ENCOUNTER — Telehealth: Payer: Self-pay | Admitting: Family Medicine

## 2014-12-28 DIAGNOSIS — E875 Hyperkalemia: Secondary | ICD-10-CM

## 2014-12-28 NOTE — Telephone Encounter (Signed)
Ordered Met C for elevated potassium. Informed pt lab slip is ready for pick up. Renaldo Fiddler, CMA

## 2014-12-28 NOTE — Telephone Encounter (Signed)
Pt called, needs to come by for lab sheet.  He said you wanted him to get more labs this week.  His call back is [phone].  Thanks, tp

## 2014-12-30 ENCOUNTER — Telehealth: Payer: Self-pay

## 2014-12-30 LAB — COMPREHENSIVE METABOLIC PANEL
A/G RATIO: 1.7 (ref 1.1–2.5)
ALK PHOS: 75 IU/L (ref 39–117)
ALT: 28 IU/L (ref 0–44)
AST: 21 IU/L (ref 0–40)
Albumin: 4.2 g/dL (ref 3.5–4.8)
BUN/Creatinine Ratio: 8 — ABNORMAL LOW (ref 10–22)
BUN: 10 mg/dL (ref 8–27)
Bilirubin Total: 0.5 mg/dL (ref 0.0–1.2)
CHLORIDE: 95 mmol/L — AB (ref 97–108)
CO2: 24 mmol/L (ref 18–29)
Calcium: 9.6 mg/dL (ref 8.6–10.2)
Creatinine, Ser: 1.23 mg/dL (ref 0.76–1.27)
GFR calc Af Amer: 66 mL/min/{1.73_m2} (ref >59)
GFR calc non Af Amer: 57 mL/min/{1.73_m2} — ABNORMAL LOW (ref >59)
GLOBULIN, TOTAL: 2.5 g/dL (ref 1.5–4.5)
Glucose: 84 mg/dL (ref 65–99)
Potassium: 5.1 mmol/L (ref 3.5–5.2)
Sodium: 139 mmol/L (ref 134–144)
Total Protein: 6.7 g/dL (ref 6.0–8.5)

## 2014-12-30 NOTE — Telephone Encounter (Signed)
LMTCB 12/30/2014  Thanks,   -Librado Guandique  

## 2014-12-30 NOTE — Telephone Encounter (Signed)
-----   Message from Margarita Rana, MD sent at 12/30/2014  8:36 AM EDT ----- Labs stable. Please notify patient. Thanks.

## 2015-01-02 NOTE — Telephone Encounter (Signed)
Advised pt of lab results. Pt verbally acknowledges understanding. Emily Drozdowski, CMA   

## 2015-01-04 ENCOUNTER — Telehealth: Payer: Self-pay | Admitting: Family Medicine

## 2015-01-04 NOTE — Telephone Encounter (Signed)
Pt states he is returning a call.  ZH#[phone]/HD

## 2015-01-04 NOTE — Telephone Encounter (Signed)
Pt was already advised of lab work.  I spoke with Mr. Maya to verify.   Thanks,   -Mickel Baas

## 2015-01-31 ENCOUNTER — Encounter: Payer: Self-pay | Admitting: Family Medicine

## 2015-02-14 ENCOUNTER — Ambulatory Visit
Admission: RE | Admit: 2015-02-14 | Discharge: 2015-02-14 | Disposition: A | Discharge status: Home / Self Care | Payer: PPO | Source: Ambulatory Visit | Attending: Family Medicine | Admitting: Family Medicine

## 2015-02-14 ENCOUNTER — Encounter: Payer: Self-pay | Admitting: Family Medicine

## 2015-02-14 ENCOUNTER — Ambulatory Visit (INDEPENDENT_AMBULATORY_CARE_PROVIDER_SITE_OTHER): Payer: PPO | Admitting: Family Medicine

## 2015-02-14 VITALS — BP 144/82 | HR 98 | Temp 98.3°F | Resp 16 | Wt 174.0 lb

## 2015-02-14 DIAGNOSIS — R05 Cough: Secondary | ICD-10-CM

## 2015-02-14 DIAGNOSIS — R509 Fever, unspecified: Secondary | ICD-10-CM

## 2015-02-14 DIAGNOSIS — J189 Pneumonia, unspecified organism: Secondary | ICD-10-CM | POA: Insufficient documentation

## 2015-02-14 DIAGNOSIS — J069 Acute upper respiratory infection, unspecified: Secondary | ICD-10-CM | POA: Diagnosis not present

## 2015-02-14 DIAGNOSIS — R059 Cough, unspecified: Secondary | ICD-10-CM

## 2015-02-14 MED ORDER — LEVOFLOXACIN 500 MG PO TABS: 500.0000 mg | ORAL_TABLET | Freq: Every day | ORAL | Status: DC

## 2015-02-14 NOTE — Progress Notes (Signed)
Patient ID: [name], male   DOB: [birth year], 74 y.o.   MRN: [id number]         Patient: [name] Male    DOB: Mar 16, 1941   74 y.o.   MRN: [id number] Visit Date: 02/14/2015  Today's Provider: Margarita Rana, MD   Chief Complaint  Patient presents with  . URI  . Cough  . Fever   Subjective:    URI  This is a new problem. The current episode started in the past 7 days. The problem has been gradually worsening. The maximum temperature recorded prior to his arrival was 100.4 - 100.9 F. The fever has been present for 3 to 4 days. Associated symptoms include coughing and wheezing. Pertinent negatives include no abdominal pain, chest pain, congestion, diarrhea, dysuria, ear pain, headaches, joint pain, joint swelling, nausea, neck pain, plugged ear sensation, rhinorrhea, sinus pain, sneezing, sore throat, swollen glands or vomiting. He has tried inhaler use for the symptoms.  Cough This is a new problem. The current episode started in the past 7 days. The problem has been gradually worsening. The cough is productive of sputum. Associated symptoms include a fever, shortness of breath and wheezing. Pertinent negatives include no chest pain, chills, ear pain, headaches, heartburn, hemoptysis, myalgias, nasal congestion, postnasal drip, rhinorrhea or sore throat.  Fever  This is a new problem. The current episode started in the past 7 days. The maximum temperature noted was 102 to 102.9 F. Associated symptoms include coughing and wheezing. Pertinent negatives include no abdominal pain, chest pain, congestion, diarrhea, ear pain, headaches, nausea, sore throat, urinary pain or vomiting.   Dizzy over the weekend.  Called on call service. Was told with dehydration and increased fluid did help.      Allergies  Allergen Reactions  . Timolol Maleate [Timolol Maleate] Shortness Of Breath  . Budesonide-Formoterol Fumarate Other (See Comments)    Patient reports dizziness and trembling/shaky feeling.   . Lisinopril     cough   Previous Medications   ALBUTEROL (PROVENTIL HFA;VENTOLIN HFA) 108 (90 BASE) MCG/ACT INHALER    Inhale into the lungs.   ASPIRIN 81 MG TABLET    Take 81 mg by mouth daily.   BRINZOLAMIDE-BRIMONIDINE 1-0.2 % SUSP    Apply 1 drop to eye 2 (two) times daily. To each eye.   CALCIUM CARBONATE (OS-CAL) 600 MG TABS    Take 600 mg by mouth daily.    DAPSONE 100 MG TABLET    Take by mouth.   FLUTICASONE (FLONASE) 50 MCG/ACT NASAL SPRAY    Place 1 spray into both nostrils 2 (two) times daily.   LATANOPROST (XALATAN) 0.005 % OPHTHALMIC SOLUTION    Place 1 drop into both eyes daily.   MULTIPLE VITAMIN (MULTIVITAMIN) TABLET    Take 1 tablet by mouth daily.   PREDNISONE (DELTASONE) 20 MG TABLET    Take 20 mg by mouth daily with breakfast.   TIOTROPIUM (SPIRIVA HANDIHALER) 18 MCG INHALATION CAPSULE    Place into inhaler and inhale.   TRAZODONE (DESYREL) 100 MG TABLET    Take 200 mg by mouth at bedtime.   VORICONAZOLE (VFEND) 200 MG TABLET    Two (2) times a day.     Review of Systems  Constitutional: Positive for fever and fatigue. Negative for chills, diaphoresis, activity change, appetite change and unexpected weight change.  HENT: Negative for congestion, ear discharge, ear pain, facial swelling, hearing loss, mouth sores, nosebleeds, postnasal drip, rhinorrhea, sinus pressure, sneezing, sore throat,  tinnitus, trouble swallowing and voice change.   Respiratory: Positive for cough, shortness of breath and wheezing. Negative for apnea, hemoptysis, choking, chest tightness and stridor.   Cardiovascular: Negative for chest pain, palpitations and leg swelling.  Gastrointestinal: Negative.  Negative for heartburn, nausea, vomiting, abdominal pain, diarrhea, constipation, blood in stool, abdominal distention, anal bleeding and rectal pain.  Genitourinary: Negative for dysuria.  Musculoskeletal: Negative for myalgias, joint pain and neck pain.  Neurological: Positive for weakness.  Negative for dizziness, light-headedness, numbness and headaches.    Social History  Substance Use Topics  . Smoking status: Former Smoker -- 0.50 packs/day for 3 years    Types: Cigarettes    Quit date: 06/17/1958  . Smokeless tobacco: Never Used     Comment: smoking while in college  . Alcohol Use: Yes     Comment: OCCASSIONAL WINE ONLY   Objective:   BP 144/82 mmHg  Pulse 98  Temp(Src) 98.3 F (36.8 C) (Oral)  Resp 16  Wt 174 lb (78.926 kg)  SpO2 91%  Physical Exam  Constitutional: He is oriented to person, place, and time. He appears well-developed and well-nourished.  Cardiovascular: Normal rate and regular rhythm.   Pulmonary/Chest: Effort normal and breath sounds normal.  Neurological: He is alert and oriented to person, place, and time.  Psychiatric: He has a normal mood and affect. His behavior is normal. Thought content normal.      Assessment & Plan:     1. Upper respiratory infection Unclear if cause of symptoms.   2. Other specified fever Presumed related to infection. Will treat.   3. Cough Related to pulmonary infection.   4. CAP (community acquired pneumonia) Condition is worsening. Will start medication for better control.  Patient instructed to call back if condition worsens or does not improved.  - levofloxacin (LEVAQUIN) 500 MG tablet; Take 1 tablet (500 mg total) by mouth daily.  Dispense: 10 tablet; Refill: 0 - DG Chest 2 View; Future  5. Recurrent pneumonia Has had multiple bouts of pneumonia. Will treat as above. Is followed by pulmonologist for lung issues.       Margarita Rana, MD  [facility]

## 2015-02-15 ENCOUNTER — Telehealth: Payer: Self-pay

## 2015-02-15 NOTE — Telephone Encounter (Signed)
Pt's wife called saying Dr. Orlin Hilding recommended them to go get lab work done in [facility] office,  She said she would call you back this afternoon when they got back in.  Thanks [employer]

## 2015-02-15 NOTE — Telephone Encounter (Signed)
LMTCB Beniah Magnan Drozdowski, CMA  

## 2015-02-15 NOTE — Telephone Encounter (Signed)
-----   Message from Margarita Rana, MD sent at 02/14/2015  4:22 PM EDT ----- Previous infiltrate and effusion greatly improved.  Continue antibiotics. Thanks.

## 2015-02-16 NOTE — Telephone Encounter (Signed)
Patient and wife advised as below. sd

## 2015-05-04 ENCOUNTER — Ambulatory Visit (INDEPENDENT_AMBULATORY_CARE_PROVIDER_SITE_OTHER): Payer: PPO

## 2015-05-04 DIAGNOSIS — Z23 Encounter for immunization: Secondary | ICD-10-CM | POA: Diagnosis not present

## 2015-05-15 DIAGNOSIS — Z86718 Personal history of other venous thrombosis and embolism: Secondary | ICD-10-CM | POA: Insufficient documentation

## 2015-05-15 DIAGNOSIS — I82419 Acute embolism and thrombosis of unspecified femoral vein: Secondary | ICD-10-CM | POA: Insufficient documentation

## 2015-07-25 DIAGNOSIS — H401132 Primary open-angle glaucoma, bilateral, moderate stage: Secondary | ICD-10-CM | POA: Diagnosis not present

## 2015-07-26 DIAGNOSIS — L821 Other seborrheic keratosis: Secondary | ICD-10-CM | POA: Diagnosis not present

## 2015-07-26 DIAGNOSIS — D485 Neoplasm of uncertain behavior of skin: Secondary | ICD-10-CM | POA: Diagnosis not present

## 2015-07-26 DIAGNOSIS — L578 Other skin changes due to chronic exposure to nonionizing radiation: Secondary | ICD-10-CM | POA: Diagnosis not present

## 2015-07-26 DIAGNOSIS — B360 Pityriasis versicolor: Secondary | ICD-10-CM | POA: Diagnosis not present

## 2015-07-26 DIAGNOSIS — Z1283 Encounter for screening for malignant neoplasm of skin: Secondary | ICD-10-CM | POA: Diagnosis not present

## 2015-07-26 DIAGNOSIS — L820 Inflamed seborrheic keratosis: Secondary | ICD-10-CM | POA: Diagnosis not present

## 2015-07-26 DIAGNOSIS — L819 Disorder of pigmentation, unspecified: Secondary | ICD-10-CM | POA: Diagnosis not present

## 2015-07-26 DIAGNOSIS — D692 Other nonthrombocytopenic purpura: Secondary | ICD-10-CM | POA: Diagnosis not present

## 2015-07-26 DIAGNOSIS — D229 Melanocytic nevi, unspecified: Secondary | ICD-10-CM | POA: Diagnosis not present

## 2015-07-26 DIAGNOSIS — D1800 Hemangioma unspecified site: Secondary | ICD-10-CM | POA: Diagnosis not present

## 2015-07-26 DIAGNOSIS — L570 Actinic keratosis: Secondary | ICD-10-CM | POA: Diagnosis not present

## 2015-08-01 DIAGNOSIS — H401132 Primary open-angle glaucoma, bilateral, moderate stage: Secondary | ICD-10-CM | POA: Diagnosis not present

## 2015-08-10 DIAGNOSIS — R0600 Dyspnea, unspecified: Secondary | ICD-10-CM | POA: Diagnosis not present

## 2015-08-10 DIAGNOSIS — I10 Essential (primary) hypertension: Secondary | ICD-10-CM | POA: Diagnosis not present

## 2015-08-10 DIAGNOSIS — Z79899 Other long term (current) drug therapy: Secondary | ICD-10-CM | POA: Diagnosis not present

## 2015-08-10 DIAGNOSIS — J449 Chronic obstructive pulmonary disease, unspecified: Secondary | ICD-10-CM | POA: Diagnosis not present

## 2015-08-15 DIAGNOSIS — D7589 Other specified diseases of blood and blood-forming organs: Secondary | ICD-10-CM | POA: Diagnosis not present

## 2015-08-15 DIAGNOSIS — I82431 Acute embolism and thrombosis of right popliteal vein: Secondary | ICD-10-CM | POA: Diagnosis not present

## 2015-08-15 DIAGNOSIS — I82409 Acute embolism and thrombosis of unspecified deep veins of unspecified lower extremity: Secondary | ICD-10-CM | POA: Diagnosis not present

## 2015-08-15 DIAGNOSIS — R200 Anesthesia of skin: Secondary | ICD-10-CM | POA: Diagnosis not present

## 2015-08-15 DIAGNOSIS — I82411 Acute embolism and thrombosis of right femoral vein: Secondary | ICD-10-CM | POA: Diagnosis not present

## 2015-08-15 DIAGNOSIS — Z79899 Other long term (current) drug therapy: Secondary | ICD-10-CM | POA: Diagnosis not present

## 2015-08-15 DIAGNOSIS — Z7901 Long term (current) use of anticoagulants: Secondary | ICD-10-CM | POA: Diagnosis not present

## 2015-08-30 DIAGNOSIS — R0600 Dyspnea, unspecified: Secondary | ICD-10-CM | POA: Diagnosis not present

## 2015-08-30 DIAGNOSIS — R918 Other nonspecific abnormal finding of lung field: Secondary | ICD-10-CM | POA: Diagnosis not present

## 2015-10-03 ENCOUNTER — Ambulatory Visit (INDEPENDENT_AMBULATORY_CARE_PROVIDER_SITE_OTHER): Payer: PPO | Admitting: Family Medicine

## 2015-10-03 ENCOUNTER — Ambulatory Visit
Admission: RE | Admit: 2015-10-03 | Discharge: 2015-10-03 | Disposition: A | Discharge status: Home / Self Care | Payer: PPO | Source: Ambulatory Visit | Attending: Family Medicine | Admitting: Family Medicine

## 2015-10-03 ENCOUNTER — Encounter: Payer: Self-pay | Admitting: Family Medicine

## 2015-10-03 VITALS — BP 146/98 | HR 68 | Temp 97.9°F | Resp 12 | Wt 177.0 lb

## 2015-10-03 DIAGNOSIS — S39011A Strain of muscle, fascia and tendon of abdomen, initial encounter: Secondary | ICD-10-CM | POA: Insufficient documentation

## 2015-10-03 DIAGNOSIS — R9341 Abnormal radiologic findings on diagnostic imaging of renal pelvis, ureter, or bladder: Secondary | ICD-10-CM | POA: Diagnosis not present

## 2015-10-03 DIAGNOSIS — R319 Hematuria, unspecified: Secondary | ICD-10-CM | POA: Diagnosis not present

## 2015-10-03 DIAGNOSIS — X58XXXA Exposure to other specified factors, initial encounter: Secondary | ICD-10-CM | POA: Insufficient documentation

## 2015-10-03 LAB — POCT URINALYSIS DIPSTICK
Bilirubin, UA: NEGATIVE
Blood, UA: NEGATIVE
GLUCOSE UA: NEGATIVE
Ketones, UA: NEGATIVE
Leukocytes, UA: NEGATIVE
Nitrite, UA: NEGATIVE
PROTEIN UA: NEGATIVE
Spec Grav, UA: 1.02
UROBILINOGEN UA: 0.2
pH, UA: 5

## 2015-10-03 MED ORDER — TRAMADOL HCL 50 MG PO TABS: 50.0000 mg | ORAL_TABLET | ORAL | Status: DC | PRN

## 2015-10-03 NOTE — Progress Notes (Signed)
Patient ID: [name], male   DOB: 03-Sep-1940, 75 y.o.   MRN: [id number]    Subjective:  HPI  Patient states for about 5 to 6 weeks he has had a right side pain. It does not radiate, localized. Patient states pain is stabbing/sharp at times. Pain is present with movement, does not hurt to the touch or at rest. He feels like the pain has become more intense. No bloating. Indigestion, no bowel movements issues and no urinary issues. No rash or redness in the area of the pain. He does not recall doing anything unusual to have pulled a muscle.  Prior to Admission medications   Medication Sig Start Date End Date Taking? Authorizing Provider  albuterol (PROVENTIL HFA;VENTOLIN HFA) 108 (90 BASE) MCG/ACT inhaler Inhale into the lungs. 01/02/15 01/02/16 Yes Historical Provider, MD  azithromycin (ZITHROMAX) 250 MG tablet  09/18/15  Yes Historical Provider, MD  Brinzolamide-Brimonidine 1-0.2 % SUSP Apply 1 drop to eye 2 (two) times daily. To each eye.   Yes Historical Provider, MD  calcium carbonate (OS-CAL) 600 MG TABS Take 600 mg by mouth daily.    Yes Historical Provider, MD  dorzolamide (TRUSOPT) 2 % ophthalmic solution  08/31/15  Yes Historical Provider, MD  gabapentin (NEURONTIN) 400 MG capsule Take 400 mg by mouth 3 (three) times daily.  09/18/15  Yes Historical Provider, MD  latanoprost (XALATAN) 0.005 % ophthalmic solution Place 1 drop into both eyes daily.   Yes Historical Provider, MD  Multiple Vitamin (MULTIVITAMIN) tablet Take 1 tablet by mouth daily.   Yes Historical Provider, MD  predniSONE (DELTASONE) 10 MG tablet Take 10 mg by mouth daily with breakfast.   Yes Historical Provider, MD  tiotropium (SPIRIVA HANDIHALER) 18 MCG inhalation capsule Place into inhaler and inhale. 02/06/15 02/06/16 Yes Historical Provider, MD  Alveda Reasons 20 MG TABS tablet  09/11/15  Yes Historical Provider, MD    Patient Active Problem List   Diagnosis Date Noted  . Recurrent pneumonia 12/21/2014  . Pneumothorax, right  11/04/2014  . Basal cell carcinoma 10/19/2014  . Abnormal liver enzymes 10/19/2014  . Personal history of methicillin resistant Staphylococcus aureus 10/19/2014  . Cannot sleep 10/19/2014  . Heel pain 10/19/2014  . Pain in shoulder 10/19/2014  . Back strain 10/19/2014  . Pulmonary aspergillosis, allergic bronchopulmonary type ([place]) 07/30/2014  . ABPA (allergic bronchopulmonary aspergillosis) ([place]) 07/30/2014  . CAP (community acquired pneumonia) 05/03/2014  . Abnormal CXR 04/01/2014  . Paroxysmal tachycardia ([place]) 06/23/2013  . Asymptomatic PVCs 06/23/2013  . Beat, premature ventricular 06/23/2013  . Cough 10/19/2012  . Allergic rhinitis 01/17/2009  . Asthma, chronic obstructive, without status asthmaticus ([place]) 11/04/2008  . CAFL (chronic airflow limitation) ([place]) 11/04/2008  . HYPERTENSION 02/09/2008  . Arthropathia 08/28/2007  . Disorder of mitral valve 07/08/2005  . Benign prostatic hypertrophy without urinary obstruction 05/16/1997  . LBP (low back pain) 05/16/1997  . Degenerative arthritis of hip 03/18/1995    Past Medical History  Diagnosis Date  . Glaucoma   . Hypertension   . AB (asthmatic bronchitis)   . Cellulitis   . Hx MRSA infection   . Insomnia   . Upper back strain   . Foreign body in skin   . Osteoarthrosis   . Mitral valve disorder   . Arthropathy   . Asthma   . Allergic rhinitis   . Insomnia   . Hx MRSA infection   . Cancer     Social History   Social History  . Marital Status:  Married    Spouse Name: Everlene Farrier  . Number of Children: 2  . Years of Education: College   Occupational History  . Retired     Chief Financial Officer   Social History Main Topics  . Smoking status: Former Smoker -- 0.50 packs/day for 3 years    Types: Cigarettes    Quit date: 06/17/1958  . Smokeless tobacco: Never Used     Comment: smoking while in college  . Alcohol Use: Yes     Comment: OCCASSIONAL WINE ONLY  . Drug Use: No  . Sexual Activity: Not on file   Other  Topics Concern  . Not on file   Social History Narrative   He is a married father of 2 daughters.   He smoked casually while in college for about 3 years less than a pack a day.   He drinks 3-4 glass of wine at night. He also has about 2 servings of coffee a day   He is a very active gentleman with [employer] exercise program at least 3-4 days a week.   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   In addition to this standing his exercise, he does lots of gardening and uses a chain saw to cut wood.             Allergies  Allergen Reactions  . Timolol Maleate [Timolol Maleate] Shortness Of Breath  . Budesonide-Formoterol Fumarate Other (See Comments)    Patient reports dizziness and trembling/shaky feeling.  . Lisinopril     cough    Review of Systems  Constitutional: Negative.   HENT: Negative.   Eyes: Negative.   Respiratory: Negative.   Cardiovascular: Negative.   Gastrointestinal: Positive for abdominal pain.  Musculoskeletal: Positive for myalgias.  Skin: Negative.   Psychiatric/Behavioral: Negative.     Immunization History  Administered Date(s) Administered  . Influenza Split 03/02/2012, 03/17/2013, 05/07/2014  . Influenza Whole 03/24/2007, 03/15/2009, 02/15/2010  . Influenza, High Dose Seasonal PF 05/04/2015  . Pneumococcal Polysaccharide-23 03/19/2007, 03/17/2009   Objective:  BP 146/98 mmHg  Pulse 68  Temp(Src) 97.9 F (36.6 C)  Resp 12  Wt 177 lb (80.287 kg)  Physical Exam  Constitutional: He is oriented to person, place, and time and well-developed, well-nourished, and in no distress.  HENT:  Head: Normocephalic and atraumatic.  Right Ear: External ear normal.  Left Ear: External ear normal.  Nose: Nose normal.  Eyes: Conjunctivae are normal.  Neck: Neck supple.  Cardiovascular: Normal rate, regular rhythm and normal heart sounds.   Pulmonary/Chest: Effort normal and breath sounds normal.  Abdominal: Soft. He  exhibits no distension. There is tenderness. There is no rebound and no guarding.  Tenderness just above an medial to the right iliac crest. (right)  Musculoskeletal: He exhibits no edema.  Neurological: He is alert and oriented to person, place, and time.    Lab Results  Component Value Date   WBC 13.3* 12/21/2014   HGB 14.1 11/09/2014   HCT 50.2 12/21/2014   PLT 259 12/21/2014   GLUCOSE 84 12/29/2014   TSH 0.898 12/21/2014   PSA 3.6 11/17/2013    CMP     Component Value Date/Time   NA 139 12/29/2014 0806   NA 139 11/04/2014 0556   NA 138 04/08/2014 0540   K 5.1 12/29/2014 0806   K 4.4 04/08/2014 0540   CL 95* 12/29/2014 0806   CL 104 04/08/2014 0540   CO2 24 12/29/2014 0806   CO2  27 04/08/2014 0540   GLUCOSE 84 12/29/2014 0806   GLUCOSE 117* 11/04/2014 0556   GLUCOSE 134* 04/08/2014 0540   BUN 10 12/29/2014 0806   BUN 13 11/04/2014 0556   BUN 15 04/08/2014 0540   CREATININE 1.23 12/29/2014 0806   CREATININE 1.23 04/08/2014 0540   CREATININE 1.1 12/28/2013   CALCIUM 9.6 12/29/2014 0806   CALCIUM 7.9* 04/08/2014 0540   PROT 6.7 12/29/2014 0806   PROT 7.2 04/07/2014 1745   ALBUMIN 4.2 12/29/2014 0806   ALBUMIN 3.0* 04/07/2014 1745   AST 21 12/29/2014 0806   AST 15 04/07/2014 1745   ALT 28 12/29/2014 0806   ALT 24 04/07/2014 1745   ALKPHOS 75 12/29/2014 0806   ALKPHOS 94 04/07/2014 1745   BILITOT 0.5 12/29/2014 0806   BILITOT 0.8 04/07/2014 1745   GFRNONAA 57* 12/29/2014 0806   GFRNONAA >60 04/08/2014 0540   GFRNONAA >60 12/22/2013 2235   GFRAA 66 12/29/2014 0806   GFRAA >60 04/08/2014 0540   GFRAA >60 12/22/2013 2235    Assessment and Plan :  1. Strain of abdominal muscle, initial encounter I think patient pulled a muscle somehow. Advised patient to use heat to the area, rest. Will start Tramadol for patient to use till the end of April and then can stop. Will get KUB Xray to be complete. I think this will gradually get better. Some tenderness on the  exam.  Patient was seen and examined by Dr. Eulas Post and note was scribed by Theressa Millard, RMA. I have done the exam and reviewed the above chart and it is accurate to the best of my knowledge.    Miguel Aschoff MD [facility] 10/03/2015 9:35 AM

## 2015-10-03 NOTE — Progress Notes (Signed)
Advised  ED 

## 2015-11-01 ENCOUNTER — Ambulatory Visit (INDEPENDENT_AMBULATORY_CARE_PROVIDER_SITE_OTHER): Payer: PPO | Admitting: Podiatry

## 2015-11-01 ENCOUNTER — Ambulatory Visit: Payer: PPO

## 2015-11-01 ENCOUNTER — Encounter: Payer: Self-pay | Admitting: Podiatry

## 2015-11-01 ENCOUNTER — Ambulatory Visit (INDEPENDENT_AMBULATORY_CARE_PROVIDER_SITE_OTHER): Payer: PPO

## 2015-11-01 VITALS — BP 132/68 | HR 70 | Resp 16

## 2015-11-01 DIAGNOSIS — M778 Other enthesopathies, not elsewhere classified: Secondary | ICD-10-CM

## 2015-11-01 DIAGNOSIS — M79671 Pain in right foot: Secondary | ICD-10-CM

## 2015-11-01 DIAGNOSIS — Q6651 Congenital pes planus, right foot: Secondary | ICD-10-CM

## 2015-11-01 DIAGNOSIS — M7751 Other enthesopathy of right foot: Secondary | ICD-10-CM

## 2015-11-01 DIAGNOSIS — M129 Arthropathy, unspecified: Secondary | ICD-10-CM

## 2015-11-01 DIAGNOSIS — M79672 Pain in left foot: Secondary | ICD-10-CM | POA: Diagnosis not present

## 2015-11-01 DIAGNOSIS — M19079 Primary osteoarthritis, unspecified ankle and foot: Secondary | ICD-10-CM

## 2015-11-01 DIAGNOSIS — M779 Enthesopathy, unspecified: Secondary | ICD-10-CM

## 2015-11-01 MED ORDER — DICLOFENAC SODIUM 1 % TD GEL: 4.0000 g | Freq: Four times a day (QID) | TRANSDERMAL | Status: DC

## 2015-11-01 NOTE — Progress Notes (Signed)
   Subjective:    Patient ID: [name], male    DOB: June 24, 1940, 75 y.o.   MRN: [id number]  HPI: He presents today with a 2 month history of pain to the dorsal aspect of his right foot dorsal central and lateral. He has some numbness to his left foot probably from his left hip he states. He's had no trauma to the right foot but appears to be flatter and there is pain with every step he takes in the medial longitudinal arch area dorsally.    Review of Systems  All other systems reviewed and are negative.      Objective:   Physical Exam: I have reviewed his past history medications allergies surgeon social history. Pulses are strongly palpable. Neurologic sensorium is intact. Deep tendon reflexes are intact and muscle strength is normal. Orthopedic evaluation demonstrates pes planus with dorsal spurring large nonpulsatile nodules to the dorsal aspect of the right foot Lisfranc joints. This is consistent with osteoarthritis. Findings of the right foot on radiograph confirmed the same ring.        Assessment & Plan:  Capsulitis osteoarthritis with pes planus right foot.  Plan: I injected the area today with dexamethasone and local anesthetic also wrote a prescription for diclofenac gel to be applied twice to 4 times daily.

## 2015-11-16 DIAGNOSIS — R0600 Dyspnea, unspecified: Secondary | ICD-10-CM | POA: Diagnosis not present

## 2015-11-16 DIAGNOSIS — J8489 Other specified interstitial pulmonary diseases: Secondary | ICD-10-CM | POA: Diagnosis not present

## 2015-11-16 DIAGNOSIS — M1610 Unilateral primary osteoarthritis, unspecified hip: Secondary | ICD-10-CM | POA: Diagnosis not present

## 2015-11-16 DIAGNOSIS — I10 Essential (primary) hypertension: Secondary | ICD-10-CM | POA: Diagnosis not present

## 2015-11-16 DIAGNOSIS — J449 Chronic obstructive pulmonary disease, unspecified: Secondary | ICD-10-CM | POA: Diagnosis not present

## 2015-11-17 DIAGNOSIS — N179 Acute kidney failure, unspecified: Secondary | ICD-10-CM | POA: Diagnosis not present

## 2015-11-22 DIAGNOSIS — S30861A Insect bite (nonvenomous) of abdominal wall, initial encounter: Secondary | ICD-10-CM | POA: Diagnosis not present

## 2015-11-22 DIAGNOSIS — S80862A Insect bite (nonvenomous), left lower leg, initial encounter: Secondary | ICD-10-CM | POA: Diagnosis not present

## 2015-11-22 DIAGNOSIS — S80861A Insect bite (nonvenomous), right lower leg, initial encounter: Secondary | ICD-10-CM | POA: Diagnosis not present

## 2015-12-05 ENCOUNTER — Encounter: Payer: Self-pay | Admitting: Physician Assistant

## 2015-12-05 ENCOUNTER — Ambulatory Visit (INDEPENDENT_AMBULATORY_CARE_PROVIDER_SITE_OTHER): Payer: PPO | Admitting: Physician Assistant

## 2015-12-05 ENCOUNTER — Other Ambulatory Visit: Payer: Self-pay | Admitting: Physician Assistant

## 2015-12-05 VITALS — BP 144/72 | HR 74 | Temp 97.6°F | Resp 16 | Wt 176.6 lb

## 2015-12-05 DIAGNOSIS — W57XXXA Bitten or stung by nonvenomous insect and other nonvenomous arthropods, initial encounter: Secondary | ICD-10-CM

## 2015-12-05 DIAGNOSIS — T148 Other injury of unspecified body region: Secondary | ICD-10-CM | POA: Diagnosis not present

## 2015-12-05 DIAGNOSIS — K140 Glossitis: Secondary | ICD-10-CM | POA: Diagnosis not present

## 2015-12-05 MED ORDER — DOXYCYCLINE HYCLATE 100 MG PO TABS: 100.0000 mg | ORAL_TABLET | Freq: Two times a day (BID) | ORAL | Status: DC

## 2015-12-05 NOTE — Progress Notes (Signed)
Patient: [name] Male    DOB: May 21, 1941   75 y.o.   MRN: [id number] Visit Date: 12/05/2015  Today's Provider: Mar Daring, PA-C   Chief Complaint  Patient presents with  . Tick Removal    Tick bite   Subjective:    HPI Patient here with c/o of having a tick bite below the left knee and the area is hurting and red. He took the tick out last night. He brought it with him today.  Tongue: He reports is sensitive, sore and has discoloration for the past three months. He reports is more sensitive when he has anything hot (coffee gives him most trouble). No problem with eating or sores. There have been no new medication changes in this time frame, however, last month he started a new inhaler. He is followed for a chronic lung infection by a pulmonologist at [facility] and has a lot of testing done with her.      Allergies  Allergen Reactions  . Timolol Maleate [Timolol Maleate] Shortness Of Breath  . Budesonide-Formoterol Fumarate Other (See Comments)    Patient reports dizziness and trembling/shaky feeling.  . Lisinopril     cough   Current Meds  Medication Sig  . albuterol (PROVENTIL HFA;VENTOLIN HFA) 108 (90 BASE) MCG/ACT inhaler Inhale into the lungs.  Marland Kitchen azithromycin (ZITHROMAX) 250 MG tablet   . brimonidine (ALPHAGAN) 0.15 % ophthalmic solution   . Brinzolamide-Brimonidine 1-0.2 % SUSP Apply 1 drop to eye 2 (two) times daily. To each eye.  . calcium carbonate (OS-CAL) 600 MG TABS Take 600 mg by mouth daily.   . diclofenac sodium (VOLTAREN) 1 % GEL Apply 4 g topically 4 (four) times daily.  . dorzolamide (TRUSOPT) 2 % ophthalmic solution   . gabapentin (NEURONTIN) 400 MG capsule Take 400 mg by mouth 3 (three) times daily.   Marland Kitchen latanoprost (XALATAN) 0.005 % ophthalmic solution Place 1 drop into both eyes daily.  . Multiple Vitamin (MULTIVITAMIN) tablet Take 1 tablet by mouth daily.  . predniSONE (DELTASONE) 5 MG tablet   . tiotropium (SPIRIVA HANDIHALER) 18 MCG  inhalation capsule Place into inhaler and inhale.  . traMADol (ULTRAM) 50 MG tablet Take 1 tablet (50 mg total) by mouth every 4 (four) hours as needed.  Alveda Reasons 20 MG TABS tablet   . [DISCONTINUED] predniSONE (DELTASONE) 10 MG tablet Take 10 mg by mouth daily with breakfast.    Review of Systems  Constitutional: Negative for fever, chills and fatigue.  HENT: Positive for mouth sores.   Respiratory: Negative for cough, chest tightness and shortness of breath.   Cardiovascular: Negative for chest pain, palpitations and leg swelling.  Gastrointestinal: Negative for nausea, vomiting and abdominal pain.  Skin: Positive for wound.  Neurological: Negative for weakness and numbness.    Social History  Substance Use Topics  . Smoking status: Former Smoker -- 0.50 packs/day for 3 years    Types: Cigarettes    Quit date: 06/17/1958  . Smokeless tobacco: Never Used     Comment: smoking while in college  . Alcohol Use: Yes     Comment: OCCASSIONAL WINE ONLY   Objective:   BP 144/72 mmHg  Pulse 74  Temp(Src) 97.6 F (36.4 C) (Oral)  Resp 16  Wt 176 lb 9.6 oz (80.105 kg)  Physical Exam  Constitutional: He appears well-developed and well-nourished. No distress.  HENT:  Head: Normocephalic and atraumatic.  Mouth/Throat: Uvula is midline, oropharynx is clear and moist  and mucous membranes are normal. Oral lesions present. Normal dentition. No uvula swelling. No oropharyngeal exudate, posterior oropharyngeal edema or posterior oropharyngeal erythema.    Neck: Normal range of motion. Neck supple. No JVD present. No tracheal deviation present. No thyromegaly present.  Cardiovascular: Normal rate, regular rhythm and normal heart sounds.  Exam reveals no gallop and no friction rub.   No murmur heard. Lymphadenopathy:    He has no cervical adenopathy.  Skin: He is not diaphoretic.     Vitals reviewed.       Assessment & Plan:     1. Tick bite Will have him discontinue azithromycin  while on doxycycline treatment for possible Lyme. Tick was a lone star tick. He is to monitor and call if symptoms worsen. - doxycycline (VIBRA-TABS) 100 MG tablet; Take 1 tablet (100 mg total) by mouth 2 (two) times daily.  Dispense: 14 tablet; Refill: 0  2. Glossitis I do feel he may have geographic glossitis and black, hairy tongue. He states the brown area in the middle had started out black and then lightened. No new medications since. He is rinsing after his inhalers. Will check labs to check for vitamin deficiency or anemia. WIll f/u pending results. Advised him to try biotene over the counter for now. Call if symptoms worsen. - Comprehensive Metabolic Panel (CMET) - CBC with Differential - B12 d      Mar Daring, PA-C  [facility]

## 2015-12-05 NOTE — Patient Instructions (Signed)
Tick Bite Information Ticks are insects that attach themselves to the skin and draw blood for food. There are various types of ticks. Common types include wood ticks and deer ticks. Most ticks live in shrubs and grassy areas. Ticks can climb onto your body when you make contact with leaves or grass where the tick is waiting. The most common places on the body for ticks to attach themselves are the scalp, neck, armpits, waist, and groin. Most tick bites are harmless, but sometimes ticks carry germs that cause diseases. These germs can be spread to a person during the tick's feeding process. The chance of a disease spreading through a tick bite depends on:   The type of tick.  Time of year.   How long the tick is attached.   Geographic location.  HOW CAN YOU PREVENT TICK BITES? Take these steps to help prevent tick bites when you are outdoors:  Wear protective clothing. Long sleeves and long pants are best.   Wear white clothes so you can see ticks more easily.  Tuck your pant legs into your socks.   If walking on a trail, stay in the middle of the trail to avoid brushing against bushes.  Avoid walking through areas with long grass.  Put insect repellent on all exposed skin and along boot tops, pant legs, and sleeve cuffs.   Check clothing, hair, and skin repeatedly and before going inside.   Brush off any ticks that are not attached.  Take a shower or bath as soon as possible after being outdoors.  WHAT IS THE PROPER WAY TO REMOVE A TICK? Ticks should be removed as soon as possible to help prevent diseases caused by tick bites. 1. If latex gloves are available, put them on before trying to remove a tick.  2. Using fine-point tweezers, grasp the tick as close to the skin as possible. You may also use curved forceps or a tick removal tool. Grasp the tick as close to its head as possible. Avoid grasping the tick on its body. 3. Pull gently with steady upward pressure until  the tick lets go. Do not twist the tick or jerk it suddenly. This may break off the tick's head or mouth parts. 4. Do not squeeze or crush the tick's body. This could force disease-carrying fluids from the tick into your body.  5. After the tick is removed, wash the bite area and your hands with soap and water or other disinfectant such as alcohol. 6. Apply a small amount of antiseptic cream or ointment to the bite site.  7. Wash and disinfect any instruments that were used.  Do not try to remove a tick by applying a hot match, petroleum jelly, or fingernail polish to the tick. These methods do not work and may increase the chances of disease being spread from the tick bite.  WHEN SHOULD YOU SEEK MEDICAL CARE? Contact your health care provider if you are unable to remove a tick from your skin or if a part of the tick breaks off and is stuck in the skin.  After a tick bite, you need to be aware of signs and symptoms that could be related to diseases spread by ticks. Contact your health care provider if you develop any of the following in the days or weeks after the tick bite:  Unexplained fever.  Rash. A circular rash that appears days or weeks after the tick bite may indicate the possibility of Lyme disease. The rash may resemble   a target with a bull's-eye and may occur at a different part of your body than the tick bite.  Redness and swelling in the area of the tick bite.   Tender, swollen lymph glands.   Diarrhea.   Weight loss.   Cough.   Fatigue.   Muscle, joint, or bone pain.   Abdominal pain.   Headache.   Lethargy or a change in your level of consciousness.  Difficulty walking or moving your legs.   Numbness in the legs.   Paralysis.  Shortness of breath.   Confusion.   Repeated vomiting.    This information is not intended to replace advice given to you by your health care provider. Make sure you discuss any questions you have with your health  care provider.   Document Released: 05/31/2000 Document Revised: 06/24/2014 Document Reviewed: 11/11/2012 Elsevier Interactive Patient Education 2016 Elsevier Inc. Glossitis Glossitis is inflammation of the tongue. This may be a stand-alone condition or it may be a symptom of a different condition that you have. Generally, glossitis goes away when its cause is identified and treated. Glossitis can be dangerous if it causes difficulty breathing. CAUSES  This condition can be caused by many different things. In some cases, the cause may not be known. Certain underlying conditions may cause glossitis, such as:   Viral, bacterial, or yeast infections.  Allergies.  Dysfunction of the skin and mucous membranes. This can happen with certain autoimmune disorders.  Abnormal tissue growths (tumors).  Lack of healthy red blood cells (anemia).  Movement of stomach acid into the tube that connects the mouth and the stomach (gastroesophageal reflux).  Lack of proper nutrition or certain vitamins.  Certain lifelong (chronic) medical conditions, such as diabetes. Sometimes, glossitis may not be caused by an underlying condition. In these cases, glossitis may be caused by:   Use of tobacco products, such as cigarettes, chewing tobacco, or e-cigarettes.  Excessive alcohol use.  Tongue injury or irritation.  Certain medicines, such as medicines to treat cancer. RISK FACTORS This condition is more likely to develop in:  8. People who are 50 years or older. 27. Men. 10. People who are taking antibiotics or steroids, such as asthma medicines. 11. People who drink alcohol excessively. 12. People who use tobacco products, including cigarettes, chewing tobacco, or e-cigarettes. 30. People with chronic medical conditions, such as immune diseases or cancer. 23. People who do not brush or floss their teeth regularly. 15. People who lack proper nutrition or are anemic. SYMPTOMS  Symptoms of this  condition vary depending on the cause. Symptoms may include:   Swelling of the tongue.  Pain and tenderness in the tongue. Sometimes, this condition is painless.  Changes in tongue color. The tongue may be pale or bright red.  Smooth areas on the tongue's surface.  A small mass of tissue (node) or white patch on the tongue.  Difficulty chewing, swallowing, or talking.  Difficulty breathing. DIAGNOSIS  This condition is diagnosed based on a physical exam and medical history. Your health care provider may ask you about your eating and drinking habits. You may have tests, including:   Blood tests.   Removal of a small amount of cells from the tongue that are examined under a microscope (biopsy).  You may be given the name of a dentist or a health care provider who specializes in ear, nose, and throat (ENT) problems (otolaryngologist). TREATMENT  Treatment for this condition depends on the underlying cause, and may include:  Following instructions from your health care provider about keeping your mouth clean and avoiding irritants that may have caused your condition or made it worse.  Nutritional therapy. Your health care provider may tell you to change your eating and drinking habits or take a nutritional supplement.  Managing underlying conditions that may have caused your glossitis.  Medicines, such as:  Corticosteroids to reduce inflammation.  Antibiotics if your condition was caused by an infection.  Local anestheticsthat numb your tongue or mouth (local anesthetics). HOME CARE INSTRUCTIONS  Keep your teeth and mouth clean. This includes brushing and flossing frequently and having regular dental checkups.  If you wear dentures or dental braces, work with your dentist to make sure they fit correctly.  Eat healthy foods. Follow instructions from your health care provider about eating or drinking restrictions.  Avoid tobacco products, including cigarettes, chewing  tobacco, or e-cigarettes. If you need help quitting, ask your health care provider.  Avoid excessive alcohol use.  Avoid any irritants that may have caused your condition or made it worse, such as chemicals or certain foods.  Keep all follow-up visits as told by your health care provider. This is important. SEEK MEDICAL CARE IF:  You have a fever.  You develop new symptoms.  You have symptoms that do not get better with medicine or get worse.  You have symptoms that do not go away after 10 days.  You cannot eat or drink because of your pain. SEEK IMMEDIATE MEDICAL CARE IF:   You have severe pain or swelling.  You have difficulty breathing, swallowing, or talking.   This information is not intended to replace advice given to you by your health care provider. Make sure you discuss any questions you have with your health care provider.   Document Released: 05/24/2002 Document Revised: 02/22/2015 Document Reviewed: 10/19/2014 Elsevier Interactive Patient Education [employer].

## 2015-12-06 LAB — CBC WITH DIFFERENTIAL/PLATELET
BASOS ABS: 0 10*3/uL (ref 0.0–0.2)
BASOS: 0 %
EOS (ABSOLUTE): 0 10*3/uL (ref 0.0–0.4)
Eos: 0 %
HEMOGLOBIN: 16.3 g/dL (ref 12.6–17.7)
Hematocrit: 48.1 % (ref 37.5–51.0)
IMMATURE GRANS (ABS): 0.1 10*3/uL (ref 0.0–0.1)
Immature Granulocytes: 1 %
LYMPHS ABS: 0.9 10*3/uL (ref 0.7–3.1)
Lymphs: 9 %
MCH: 32.4 pg (ref 26.6–33.0)
MCHC: 33.9 g/dL (ref 31.5–35.7)
MCV: 96 fL (ref 79–97)
MONOCYTES: 7 %
Monocytes Absolute: 0.6 10*3/uL (ref 0.1–0.9)
NEUTROS ABS: 7.9 10*3/uL — AB (ref 1.4–7.0)
Neutrophils: 83 %
Platelets: 245 10*3/uL (ref 150–379)
RBC: 5.03 x10E6/uL (ref 4.14–5.80)
RDW: 14.2 % (ref 12.3–15.4)
WBC: 9.5 10*3/uL (ref 3.4–10.8)

## 2015-12-06 LAB — SPECIMEN STATUS REPORT

## 2015-12-06 LAB — VITAMIN B12: VITAMIN B 12: 460 pg/mL (ref 211–946)

## 2015-12-07 LAB — COMPREHENSIVE METABOLIC PANEL
A/G RATIO: 1.4 (ref 1.2–2.2)
ALT: 13 IU/L (ref 0–44)
AST: 13 IU/L (ref 0–40)
Albumin: 3.4 g/dL — ABNORMAL LOW (ref 3.5–4.8)
Alkaline Phosphatase: 92 IU/L (ref 39–117)
BILIRUBIN TOTAL: 0.4 mg/dL (ref 0.0–1.2)
BUN / CREAT RATIO: 14 (ref 10–24)
BUN: 14 mg/dL (ref 8–27)
CALCIUM: 10.7 mg/dL — AB (ref 8.6–10.2)
CHLORIDE: 99 mmol/L (ref 96–106)
CO2: 25 mmol/L (ref 18–29)
Creatinine, Ser: 1.03 mg/dL (ref 0.76–1.27)
GFR, EST AFRICAN AMERICAN: 81 mL/min/{1.73_m2} (ref >59)
GFR, EST NON AFRICAN AMERICAN: 70 mL/min/{1.73_m2} (ref >59)
GLOBULIN, TOTAL: 2.4 g/dL (ref 1.5–4.5)
Glucose: 93 mg/dL (ref 65–99)
POTASSIUM: 5 mmol/L (ref 3.5–5.2)
Sodium: 147 mmol/L — ABNORMAL HIGH (ref 134–144)
TOTAL PROTEIN: 5.8 g/dL — AB (ref 6.0–8.5)

## 2015-12-08 ENCOUNTER — Telehealth: Payer: Self-pay

## 2015-12-08 NOTE — Telephone Encounter (Signed)
-----   Message from Mar Daring, [place] sent at 12/08/2015  8:49 AM EDT ----- All labs are within normal limits and stable.  This is everything except metabolic panel. It came back separately. Thanks! -JB

## 2015-12-08 NOTE — Telephone Encounter (Signed)
-----   Message from Mar Daring, PA-C sent at 12/08/2015  8:48 AM EDT ----- All labs were fairly stable. Sodium and calcium seemed elevated, but I think falsely because specimen seems to have not been collected correctly and was hemolyzed when they received it, which is also why it took a long time to get the results. If wanted it can be rechecked in the near future 2-4 weeks to verify. It has never been elevated before.

## 2015-12-08 NOTE — Telephone Encounter (Signed)
Patient advised as directed below. Verbalized understanding and will call in a few weeks to let us know that he is going to the lab.   Thanks,  -Amrie Gurganus

## 2015-12-26 ENCOUNTER — Encounter: Payer: PPO | Admitting: Family Medicine

## 2015-12-30 DIAGNOSIS — B370 Candidal stomatitis: Secondary | ICD-10-CM | POA: Diagnosis not present

## 2015-12-30 DIAGNOSIS — J029 Acute pharyngitis, unspecified: Secondary | ICD-10-CM | POA: Diagnosis not present

## 2015-12-31 ENCOUNTER — Emergency Department
Admission: EM | Admit: 2015-12-31 | Discharge: 2015-12-31 | Disposition: A | Discharge status: Home / Self Care | Payer: PPO | Attending: Emergency Medicine | Admitting: Emergency Medicine

## 2015-12-31 ENCOUNTER — Encounter: Payer: Self-pay | Admitting: Emergency Medicine

## 2015-12-31 DIAGNOSIS — Z8679 Personal history of other diseases of the circulatory system: Secondary | ICD-10-CM | POA: Insufficient documentation

## 2015-12-31 DIAGNOSIS — I10 Essential (primary) hypertension: Secondary | ICD-10-CM | POA: Diagnosis not present

## 2015-12-31 DIAGNOSIS — M1610 Unilateral primary osteoarthritis, unspecified hip: Secondary | ICD-10-CM | POA: Diagnosis not present

## 2015-12-31 DIAGNOSIS — Z79899 Other long term (current) drug therapy: Secondary | ICD-10-CM | POA: Diagnosis not present

## 2015-12-31 DIAGNOSIS — B3781 Candidal esophagitis: Secondary | ICD-10-CM | POA: Insufficient documentation

## 2015-12-31 DIAGNOSIS — J45909 Unspecified asthma, uncomplicated: Secondary | ICD-10-CM | POA: Diagnosis not present

## 2015-12-31 DIAGNOSIS — Z87891 Personal history of nicotine dependence: Secondary | ICD-10-CM | POA: Insufficient documentation

## 2015-12-31 DIAGNOSIS — J029 Acute pharyngitis, unspecified: Secondary | ICD-10-CM | POA: Diagnosis not present

## 2015-12-31 LAB — CBC WITH DIFFERENTIAL/PLATELET
Basophils Absolute: 0.1 10*3/uL (ref 0–0.1)
Basophils Relative: 1 %
Eosinophils Absolute: 0.2 10*3/uL (ref 0–0.7)
Eosinophils Relative: 1 %
HCT: 46.4 % (ref 40.0–52.0)
HEMOGLOBIN: 15.9 g/dL (ref 13.0–18.0)
LYMPHS ABS: 0.9 10*3/uL — AB (ref 1.0–3.6)
LYMPHS PCT: 6 %
MCH: 32.7 pg (ref 26.0–34.0)
MCHC: 34.2 g/dL (ref 32.0–36.0)
MCV: 95.5 fL (ref 80.0–100.0)
Monocytes Absolute: 1.3 10*3/uL — ABNORMAL HIGH (ref 0.2–1.0)
Monocytes Relative: 9 %
NEUTROS ABS: 12.5 10*3/uL — AB (ref 1.4–6.5)
NEUTROS PCT: 83 %
Platelets: 189 10*3/uL (ref 150–440)
RBC: 4.86 MIL/uL (ref 4.40–5.90)
RDW: 14.7 % — ABNORMAL HIGH (ref 11.5–14.5)
WBC: 14.9 10*3/uL — AB (ref 3.8–10.6)

## 2015-12-31 LAB — COMPREHENSIVE METABOLIC PANEL
ALT: 25 U/L (ref 17–63)
ANION GAP: 6 (ref 5–15)
AST: 55 U/L — AB (ref 15–41)
Albumin: 3.9 g/dL (ref 3.5–5.0)
Alkaline Phosphatase: 69 U/L (ref 38–126)
BUN: 11 mg/dL (ref 6–20)
CHLORIDE: 103 mmol/L (ref 101–111)
CO2: 31 mmol/L (ref 22–32)
Calcium: 9 mg/dL (ref 8.9–10.3)
Creatinine, Ser: 1.12 mg/dL (ref 0.61–1.24)
Glucose, Bld: 99 mg/dL (ref 65–99)
POTASSIUM: 4 mmol/L (ref 3.5–5.1)
Sodium: 140 mmol/L (ref 135–145)
Total Bilirubin: 0.8 mg/dL (ref 0.3–1.2)
Total Protein: 6.4 g/dL — ABNORMAL LOW (ref 6.5–8.1)

## 2015-12-31 MED ORDER — FLUCONAZOLE 100 MG PO TABS
200.0000 mg | ORAL_TABLET | ORAL | Status: AC
  Administered 2015-12-31: 200 mg via ORAL
  Filled 2015-12-31: qty 2

## 2015-12-31 MED ORDER — LIDOCAINE VISCOUS 2 % MT SOLN
15.0000 mL | Freq: Once | OROMUCOSAL | Status: AC
  Administered 2015-12-31: 15 mL via OROMUCOSAL
  Filled 2015-12-31: qty 15

## 2015-12-31 MED ORDER — ONDANSETRON 4 MG PO TBDP
4.0000 mg | ORAL_TABLET | Freq: Once | ORAL | Status: AC
  Administered 2015-12-31: 4 mg via ORAL
  Filled 2015-12-31: qty 1

## 2015-12-31 MED ORDER — FLUCONAZOLE 200 MG PO TABS: 200.0000 mg | ORAL_TABLET | Freq: Every day | ORAL | Status: DC

## 2015-12-31 MED ORDER — HYDROCODONE-ACETAMINOPHEN 5-325 MG PO TABS
1.0000 | ORAL_TABLET | ORAL | Status: AC
  Administered 2015-12-31: 1 via ORAL
  Filled 2015-12-31: qty 1

## 2015-12-31 MED ORDER — HYDROCODONE-ACETAMINOPHEN 5-325 MG PO TABS
1.0000 | ORAL_TABLET | ORAL | Status: AC
Start: 2015-12-31 — End: 2015-12-31
  Administered 2015-12-31: 1 via ORAL
  Filled 2015-12-31: qty 1

## 2015-12-31 MED ORDER — HYDROCODONE-ACETAMINOPHEN 5-325 MG PO TABS: 1.0000 | ORAL_TABLET | Freq: Four times a day (QID) | ORAL | Status: DC | PRN

## 2015-12-31 MED ORDER — MAGIC MOUTHWASH W/LIDOCAINE: 5.0000 mL | ORAL | Status: DC

## 2015-12-31 NOTE — ED Provider Notes (Signed)
[facility] Emergency Department Provider Note  ____________________________________________  Time seen: Approximately 9:38 AM  I have reviewed the triage vital signs and the nursing notes.   HISTORY  Chief Complaint Sore Throat    HPI [name] is a 75 y.o. male procedure and having a fairly severe sore throat since Thursday. He seemed urgent care and started on nystatin yesterday, but his symptoms are not improving. Reports he has severe pain in the back of his throat whenever he swallows and is been noticing white spots in the back of his throat and on his tongue. He first noticed these white spots on his tongue about 2-3 months ago, but really never thought much of them. He reports a significant severely worse the last 2-3 days, he is having a lot of burning sensation in the back of his throat whenever he tries to swallow.  No vomiting, no shortness of breath, no difficulty breathing though it is painful to swallow. He is staying hydrated and eating well. He's been taking nystatin, but really nothing for pain or discomfort.Marland Kitchen  He is currently not taking any prescription pain medication or antibiotic other than nystatin   Past Medical History  Diagnosis Date  . Glaucoma   . Hypertension   . AB (asthmatic bronchitis)   . Cellulitis   . Hx MRSA infection   . Insomnia   . Upper back strain   . Foreign body in skin   . Osteoarthrosis   . Mitral valve disorder   . Arthropathy   . Asthma   . Allergic rhinitis   . Insomnia   . Hx MRSA infection   . Cancer [facility])     Patient Active Problem List   Diagnosis Date Noted  . Recurrent pneumonia 12/21/2014  . Pneumothorax, right 11/04/2014  . Basal cell carcinoma 10/19/2014  . Abnormal liver enzymes 10/19/2014  . Personal history of methicillin resistant Staphylococcus aureus 10/19/2014  . Cannot sleep 10/19/2014  . Heel pain 10/19/2014  . Pain in shoulder 10/19/2014  . Back strain 10/19/2014  .  Pulmonary aspergillosis, allergic bronchopulmonary type ([place]) 07/30/2014  . ABPA (allergic bronchopulmonary aspergillosis) ([place]) 07/30/2014  . CAP (community acquired pneumonia) 05/03/2014  . Abnormal CXR 04/01/2014  . Paroxysmal tachycardia ([place]) 06/23/2013  . Asymptomatic PVCs 06/23/2013  . Beat, premature ventricular 06/23/2013  . Cough 10/19/2012  . Allergic rhinitis 01/17/2009  . Asthma, chronic obstructive, without status asthmaticus ([place]) 11/04/2008  . CAFL (chronic airflow limitation) ([place]) 11/04/2008  . HYPERTENSION 02/09/2008  . Arthropathia 08/28/2007  . Disorder of mitral valve 07/08/2005  . Benign prostatic hypertrophy without urinary obstruction 05/16/1997  . LBP (low back pain) 05/16/1997  . Degenerative arthritis of hip 03/18/1995    Past Surgical History  Procedure Laterality Date  . Appendectomy    . Nasal sinus surgery  09/27/2014  . Bronchoscopy      Current Outpatient Rx  Name  Route  Sig  Dispense  Refill  . ADVAIR HFA 230-21 MCG/ACT inhaler   Inhalation   Inhale 2 puffs into the lungs 2 (two) times daily.            Dispense as written.   Marland Kitchen albuterol (PROVENTIL HFA;VENTOLIN HFA) 108 (90 BASE) MCG/ACT inhaler   Inhalation   Inhale 2 puffs into the lungs 3 (three) times daily as needed for wheezing or shortness of breath.          Marland Kitchen azithromycin (ZITHROMAX) 250 MG tablet   Oral   Take  250 mg by mouth daily. *continuous medication*         . brimonidine (ALPHAGAN) 0.15 % ophthalmic solution   Both Eyes   Place 1 drop into both eyes 2 (two) times daily.          . Calcium Carb-Cholecalciferol (CALCIUM 600 + D PO)   Oral   Take 1 tablet by mouth 2 (two) times daily.         . dorzolamide (TRUSOPT) 2 % ophthalmic solution   Both Eyes   Place 1 drop into both eyes 2 (two) times daily.          Marland Kitchen gabapentin (NEURONTIN) 400 MG capsule   Oral   Take 400 mg by mouth 3 (three) times daily.          Marland Kitchen latanoprost (XALATAN) 0.005 %  ophthalmic solution   Both Eyes   Place 1 drop into both eyes at bedtime.          . Multiple Vitamin (MULTIVITAMIN) tablet   Oral   Take 1 tablet by mouth daily.         Marland Kitchen nystatin (MYCOSTATIN) 100000 UNIT/ML suspension   Oral   Take 5 mLs by mouth 4 (four) times daily. X 14 days.         . predniSONE (DELTASONE) 5 MG tablet   Oral   Take 5 mg by mouth daily with breakfast.          . tiotropium (SPIRIVA HANDIHALER) 18 MCG inhalation capsule   Inhalation   Place 18 mcg into inhaler and inhale daily.          Alveda Reasons 20 MG TABS tablet   Oral   Take 20 mg by mouth daily with supper.            Dispense as written.   . diclofenac sodium (VOLTAREN) 1 % GEL   Topical   Apply 4 g topically 4 (four) times daily.   100 g   4   . doxycycline (VIBRA-TABS) 100 MG tablet   Oral   Take 1 tablet (100 mg total) by mouth 2 (two) times daily.   14 tablet   0   . traMADol (ULTRAM) 50 MG tablet   Oral   Take 1 tablet (50 mg total) by mouth every 4 (four) hours as needed.   55 tablet   0     Allergies Timolol maleate; Budesonide-formoterol fumarate; and Lisinopril  Family History  Problem Relation Age of Onset  . Breast cancer Sister   . Heart failure Mother   . Pancreatic cancer Sister   . Alzheimer's disease Father   . Brain cancer Daughter     Social History Social History  Substance Use Topics  . Smoking status: Former Smoker -- 0.50 packs/day for 3 years    Types: Cigarettes    Quit date: 06/17/1958  . Smokeless tobacco: Never Used     Comment: smoking while in college  . Alcohol Use: Yes     Comment: OCCASSIONAL WINE ONLY    Review of Systems Constitutional: No fever/chills That he is noticed but he was told his temperature was 99 at ER triage. Eyes: No visual changes. ENT: See history of present illness Cardiovascular: Denies chest pain. Respiratory: Denies shortness of breath. Gastrointestinal: No abdominal pain.  No nausea, no vomiting.   No diarrhea.  No constipation. Genitourinary: Negative for dysuria. Musculoskeletal: Negative for back pain. Skin: Negative for rash. Neurological: Negative for headaches, focal weakness  or numbness.  10-point ROS otherwise negative.  ____________________________________________   PHYSICAL EXAM:  VITAL SIGNS: ED Triage Vitals  Enc Vitals Group     BP 12/31/15 0742 113/62 mmHg     Pulse Rate 12/31/15 0742 103     Resp 12/31/15 0742 16     Temp 12/31/15 0742 99.1 F (37.3 C)     Temp Source 12/31/15 0742 Oral     SpO2 12/31/15 0742 93 %     Weight 12/31/15 0739 176 lb (79.833 kg)     Height 12/31/15 0739 6\' 2"  (1.88 m)     Head Cir --      Peak Flow --      Pain Score 12/31/15 0739 9     Pain Loc --      Pain Edu? --      Excl. in [place]? --    Constitutional: Alert and oriented. Well appearing and in no acute distress.Very pleasant, accompanied by his wife. Eyes: Conjunctivae are normal. PERRL. EOMI. Head: Atraumatic. Nose: No congestion/rhinnorhea. Mouth/Throat: Mucous membranes are moist.  Oropharynx is moderately erythematous and slightly injected, there are multiple adherent whitish gray plaques overlying the tongue and also across the areas of the posterior oropharynx and palate T columns, however the tonsils themselves do not appear injected or purulent. There is no midline shift. He has normal voice. There is no tenderness across the anterior neck or trachea. There is no evidence of mass in the neck noted by clinical inspection. Neck: No stridor.  No stridor Cardiovascular: Normal rate, regular rhythm. Grossly normal heart sounds.  Good peripheral circulation. Heart rate 90 Respiratory: Normal respiratory effort.  No retractions. Lungs CTAB. Gastrointestinal: Soft and nontender. No distention. Musculoskeletal: No lower extremity tenderness nor edema.  No joint effusions. Neurologic:  Normal speech and language. No gross focal neurologic deficits are appreciated. No gait  instability. Skin:  Skin is warm, dry and intact. No rash noted. Psychiatric: Mood and affect are normal. Speech and behavior are normal.  ____________________________________________   LABS (all labs ordered are listed, but only abnormal results are displayed)  Labs Reviewed  COMPREHENSIVE METABOLIC PANEL - Abnormal; Notable for the following:    Total Protein 6.4 (*)    AST 55 (*)    All other components within normal limits  CBC WITH DIFFERENTIAL/PLATELET - Abnormal; Notable for the following:    WBC 14.9 (*)    RDW 14.7 (*)    Neutro Abs 12.5 (*)    Lymphs Abs 0.9 (*)    Monocytes Absolute 1.3 (*)    All other components within normal limits  CULTURE, GROUP A STREP [facility])   ____________________________________________  EKG   ____________________________________________  RADIOLOGY   ____________________________________________   PROCEDURES  Procedure(s) performed: None  Critical Care performed: No  ____________________________________________   INITIAL IMPRESSION / ASSESSMENT AND PLAN / ED COURSE  Pertinent labs & imaging results that were available during my care of the patient were reviewed by me and considered in my medical decision making (see chart for details).  Patient presents for sore throat, recently started on nystatin yesterday with no improvement yet. He does give a history that sounds as though he may have developed thrush on his tongue several months ago and is now having severe worsening. His clinical examination appears consistent with oral pharyngeal candidiasis, and possibly esophageal candidiasis based on his descriptor.   Patient case discussed with Dr. Kathyrn Sheriff who recommends starting the patient on oral fluconazole and he would follow-up with  him tomorrow in the clinic in the afternoon. The patient is very agreeable with this. His pain is much improved after 2 hydrocodone tablets, as well as oral lidocaine. He reports his swallowing much  better and comfortable. We discussed his treatment, careful return precautions and a follow-up plan for ear nose and throat tomorrow as well as PCP Tuesday for which she has has appointment.  I will prescribe the patient a narcotic pain medicine due to their condition which I anticipate will cause at least moderate pain short term. I discussed with the patient safe use of narcotic pain medicines, and that they are not to drive, work in dangerous areas, or ever take more than prescribed (no more than 1 pill every 6 hours). We discussed that this is the type of medication that can be  overdosed on and the risks of this type of medicine. Patient is very agreeable to only use as prescribed and to never use more than prescribed.   Reviewed the patient's medication list with Corene Cornea from pharmacy prior to initiating fluconazole therapy. We find no obvious drug drug interaction or cause to have elevated concern. I did discuss potential risks including hepatotoxicity and common side effects of fluconazole with the patient prior to initiating treatment. He will be following up closely with his doctor and has an appointment scheduled for Tuesday of this week. It does appear based on up to date, and clinical reference materials that for suspected esophageal candidiasis recommended treatment is 200 mg total, as well as old daily for 14 days and a minimum.  I discussed with the patient, he does report his pain is improving somewhat with ____________________________________________   FINAL CLINICAL IMPRESSION(S) / ED DIAGNOSES  Final diagnoses:  Candidal esophagitis (HCC)      Delman Kitten, MD 12/31/15 1104

## 2015-12-31 NOTE — Discharge Instructions (Signed)
Please start her antibiotic. Call Dr. Waynette Buttery office tomorrow morning, please notify them that you're to see him tomorrow afternoon for an emergency room follow-up.  Also follow-up with your regular doctor on Tuesday as planned. Return to the emergency room right away if you start to develop high fever, vomiting, worsening pain, difficulty breathing, your having severe difficulty swallowing, pain is out of control, new weakness, chills or other new concerns arise.  Esophagitis Esophagitis is inflammation of the esophagus. The esophagus is the tube that carries food and liquids from your mouth to your stomach. Esophagitis can cause soreness or pain in the esophagus. This condition can make it difficult and painful to swallow.  CAUSES Most causes of esophagitis are not serious. Common causes of this condition include:  Gastroesophageal reflux disease (GERD). This is when stomach contents move back up into the esophagus (reflux).  Repeated vomiting.  An allergic-type reaction, especially caused by food allergies (eosinophilic esophagitis).  Injury to the esophagus by swallowing large pills with or without water, or swallowing certain types of medicines.  Swallowing (ingesting) harmful chemicals, such as household cleaning products.  Heavy alcohol use.  An infection of the esophagus.This most often occurs in people who have a weakened immune system.  Radiation or chemotherapy treatment for cancer.  Certain diseases such as sarcoidosis, Crohn disease, and scleroderma. SYMPTOMS Symptoms of this condition include:  Difficult or painful swallowing.  Pain with swallowing acidic liquids, such as citrus juices.  Pain with burping.  Chest pain.  Difficulty breathing.  Nausea.  Vomiting.  Pain in the abdomen.  Weight loss.  Ulcers in the mouth.  Patches of white material in the mouth (candidiasis).  Fever.  Coughing up blood or vomiting blood.  Stool that is black, tarry,  or bright red. DIAGNOSIS Your health care provider will take a medical history and perform a physical exam. You may also have other tests, including:  An endoscopy to examine your stomach and esophagus with a small camera.  A test that measures the acidity level in your esophagus.  A test that measures how much pressure is on your esophagus.  A barium swallow or modified barium swallow to show the shape, size, and functioning of your esophagus.  Allergy tests. TREATMENT Treatment for this condition depends on the cause of your esophagitis. In some cases, steroids or other medicines may be given to help relieve your symptoms or to treat the underlying cause of your condition. You may have to make some lifestyle changes, such as:  Avoiding alcohol.  Quitting smoking.  Changing your diet.  Exercising.  Changing your sleep habits and your sleep environment. HOME CARE INSTRUCTIONS Take these actions to decrease your discomfort and to help avoid complications. Diet  Follow a diet as recommended by your health care provider. This may involve avoiding foods and drinks such as:  Coffee and tea (with or without caffeine).  Drinks that contain alcohol.  Energy drinks and sports drinks.  Carbonated drinks or sodas.  Chocolate and cocoa.  Peppermint and mint flavorings.  Garlic and onions.  Horseradish.  Spicy and acidic foods, including peppers, chili powder, curry powder, vinegar, hot sauces, and barbecue sauce.  Citrus fruit juices and citrus fruits, such as oranges, lemons, and limes.  Tomato-based foods, such as red sauce, chili, salsa, and pizza with red sauce.  Fried and fatty foods, such as donuts, french fries, potato chips, and high-fat dressings.  High-fat meats, such as hot dogs and fatty cuts of red and white meats,  such as rib eye steak, sausage, ham, and bacon.  High-fat dairy items, such as whole milk, butter, and cream cheese.  Eat small, frequent meals  instead of large meals.  Avoid drinking large amounts of liquid with your meals.  Avoid eating meals during the 2-3 hours before bedtime.  Avoid lying down right after you eat.  Do not exercise right after you eat.  Avoid foods and drinks that seem to make your symptoms worse. General Instructions  Pay attention to any changes in your symptoms.  Take over-the-counter and prescription medicines only as told by your health care provider. Do not take aspirin, ibuprofen, or other NSAIDs unless your health care provider told you to do so.  If you have trouble taking pills, use a pill splitter to decrease the size of the pill. This will decrease the chance of the pill getting stuck or injuring your esophagus on the way down. Also, drink water after you take a pill.  Do not use any tobacco products, including cigarettes, chewing tobacco, and e-cigarettes. If you need help quitting, ask your health care provider.  Wear loose-fitting clothing. Do not wear anything tight around your waist that causes pressure on your abdomen.  Raise (elevate) the head of your bed about 6 inches (15 cm).  Try to reduce your stress, such as with yoga or meditation. If you need help reducing stress, ask your health care provider.  If you are overweight, reduce your weight to an amount that is healthy for you. Ask your health care provider for guidance about a safe weight loss goal.  Keep all follow-up visits as told by your health care provider. This is important. SEEK MEDICAL CARE IF:  You have new symptoms.  You have unexplained weight loss.  You have difficulty swallowing, or it hurts to swallow.  You have wheezing or a persistent cough.  Your symptoms do not improve with treatment.  You have frequent heartburn for more than two weeks. SEEK IMMEDIATE MEDICAL CARE IF:  You have severe pain in your arms, neck, jaw, teeth, or back.  You feel sweaty, dizzy, or light-headed.  You have chest pain or  shortness of breath.  You vomit and your vomit looks like blood or coffee grounds.  Your stool is bloody or black.  You have a fever.  You cannot swallow, drink, or eat.   This information is not intended to replace advice given to you by your health care provider. Make sure you discuss any questions you have with your health care provider.   Document Released: 07/11/2004 Document Revised: 02/22/2015 Document Reviewed: 09/28/2014 Elsevier Interactive Patient Education [employer].

## 2015-12-31 NOTE — ED Notes (Signed)
Patient presents to the ED with sore throat since Thursday and congested cough.  Patient reports, "my throat is so sore it feels like it's constricting my breathing.  I cannot swallow."  Patient is speaking in full sentences without obvious difficulty at this time.  Patient wincing when he swallows at this time.  Ambulatory to triage without difficulty.

## 2016-01-01 DIAGNOSIS — B379 Candidiasis, unspecified: Secondary | ICD-10-CM | POA: Diagnosis not present

## 2016-01-02 ENCOUNTER — Ambulatory Visit (INDEPENDENT_AMBULATORY_CARE_PROVIDER_SITE_OTHER): Payer: PPO | Admitting: Family Medicine

## 2016-01-02 ENCOUNTER — Encounter: Payer: Self-pay | Admitting: Family Medicine

## 2016-01-02 VITALS — BP 112/64 | HR 86 | Temp 98.0°F | Resp 16 | Ht 74.0 in | Wt 176.0 lb

## 2016-01-02 DIAGNOSIS — Z0000 Encounter for general adult medical examination without abnormal findings: Secondary | ICD-10-CM | POA: Diagnosis not present

## 2016-01-02 DIAGNOSIS — I10 Essential (primary) hypertension: Secondary | ICD-10-CM | POA: Diagnosis not present

## 2016-01-02 DIAGNOSIS — J029 Acute pharyngitis, unspecified: Secondary | ICD-10-CM

## 2016-01-02 DIAGNOSIS — Z1211 Encounter for screening for malignant neoplasm of colon: Secondary | ICD-10-CM

## 2016-01-02 DIAGNOSIS — J189 Pneumonia, unspecified organism: Secondary | ICD-10-CM | POA: Diagnosis not present

## 2016-01-02 DIAGNOSIS — R748 Abnormal levels of other serum enzymes: Secondary | ICD-10-CM | POA: Diagnosis not present

## 2016-01-02 DIAGNOSIS — J449 Chronic obstructive pulmonary disease, unspecified: Secondary | ICD-10-CM | POA: Insufficient documentation

## 2016-01-02 LAB — IFOBT (OCCULT BLOOD): IFOBT: NEGATIVE

## 2016-01-02 LAB — CULTURE, GROUP A STREP (THRC)

## 2016-01-02 LAB — POCT RAPID STREP A (OFFICE): Rapid Strep A Screen: NEGATIVE

## 2016-01-02 NOTE — Progress Notes (Signed)
Patient ID: [name], male   DOB: 1941-05-13, 75 y.o.   MRN: [id number] Visit Date: 01/02/2016  Today's Provider: Wilhemena Durie, MD   Chief Complaint  Patient presents with  . Medicare Wellness   Subjective:   [name] is a 75 y.o. male who presents today for his Subsequent Annual Wellness Visit. He feels poorly. Was recent at ER for sore throat and esophageal pain. Diagnosed with yeast in throat and esophagus.  He reports exercising 3 times a week. He reports he is sleeping well. He is worried about exposure to strep pharyngitis 3 granddaughter. His recent diagnoses of yeast pharyngitis is improving slightly. He also would like to have labs checked as this has not been done in the year. He has recurrent pneumonia that is followed by specialty care. Colonoscopy- 02/13/06 diverticulosis repeat 10 years  Immunization History  Administered Date(s) Administered  . Influenza Split 03/02/2012, 03/17/2013, 05/07/2014  . Influenza Whole 03/24/2007, 03/15/2009, 02/15/2010  . Influenza, High Dose Seasonal PF 05/04/2015  . Pneumococcal Conjugate-13 11/17/2013  . Pneumococcal Polysaccharide-23 11/19/2004, 03/19/2007, 03/17/2009  . Td 05/16/1997, 05/14/2007, 09/11/2010  . Zoster 01/02/2011     Review of Systems  Constitutional: Negative.   HENT: Positive for sore throat.        Hoarseness  Eyes: Negative.   Respiratory: Negative.   Cardiovascular: Negative.   Gastrointestinal: Negative.   Endocrine: Negative.   Genitourinary: Negative.   Musculoskeletal: Negative.   Skin: Negative.   Allergic/Immunologic: Negative.   Neurological: Negative.   Hematological: Negative.   Psychiatric/Behavioral: Negative.     Patient Active Problem List   Diagnosis Date Noted  . Chronic obstructive pulmonary disease ([place]) 01/02/2016  . Recurrent pneumonia 12/21/2014  . Pneumothorax, right 11/04/2014  . Basal cell carcinoma 10/19/2014  . Abnormal liver enzymes 10/19/2014  . Personal history  of methicillin resistant Staphylococcus aureus 10/19/2014  . Cannot sleep 10/19/2014  . Heel pain 10/19/2014  . Pain in shoulder 10/19/2014  . Back strain 10/19/2014  . Pulmonary aspergillosis, allergic bronchopulmonary type ([place]) 07/30/2014  . ABPA (allergic bronchopulmonary aspergillosis) ([place]) 07/30/2014  . CAP (community acquired pneumonia) 05/03/2014  . Abnormal CXR 04/01/2014  . Paroxysmal tachycardia ([place]) 06/23/2013  . Asymptomatic PVCs 06/23/2013  . Beat, premature ventricular 06/23/2013  . Cough 10/19/2012  . Allergic rhinitis 01/17/2009  . Asthma, chronic obstructive, without status asthmaticus ([place]) 11/04/2008  . CAFL (chronic airflow limitation) ([place]) 11/04/2008  . HYPERTENSION 02/09/2008  . Arthropathia 08/28/2007  . Disorder of mitral valve 07/08/2005  . Benign prostatic hypertrophy without urinary obstruction 05/16/1997  . LBP (low back pain) 05/16/1997  . Degenerative arthritis of hip 03/18/1995    Social History   Social History  . Marital Status: Married    Spouse Name: Everlene Farrier  . Number of Children: 2  . Years of Education: College   Occupational History  . Retired     Chief Financial Officer   Social History Main Topics  . Smoking status: Former Smoker -- 0.50 packs/day for 3 years    Types: Cigarettes    Quit date: 06/17/1958  . Smokeless tobacco: Never Used     Comment: smoking while in college  . Alcohol Use: No     Comment: OCCASSIONAL WINE ONLY  . Drug Use: No  . Sexual Activity: Not on file   Other Topics Concern  . Not on file   Social History Narrative   He is a married father of 2 daughters.   He smoked casually while in  college for about 3 years less than a pack a day.   He drinks 3-4 glass of wine at night. He also has about 2 servings of coffee a day   He is a very active gentleman with [employer] exercise program at least 3-4 days a week.   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   In addition to this standing  his exercise, he does lots of gardening and uses a chain saw to cut wood.             Past Surgical History  Procedure Laterality Date  . Appendectomy    . Nasal sinus surgery  09/27/2014  . Bronchoscopy      His family history includes Alzheimer's disease in his father; Brain cancer in his daughter; Breast cancer in his sister; Heart failure in his mother; Pancreatic cancer in his sister.    Outpatient Prescriptions Prior to Visit  Medication Sig Dispense Refill  . ADVAIR HFA 230-21 MCG/ACT inhaler Inhale 2 puffs into the lungs 2 (two) times daily.     Marland Kitchen albuterol (PROVENTIL HFA;VENTOLIN HFA) 108 (90 BASE) MCG/ACT inhaler Inhale 2 puffs into the lungs 3 (three) times daily as needed for wheezing or shortness of breath.     Marland Kitchen azithromycin (ZITHROMAX) 250 MG tablet Take 250 mg by mouth daily. *continuous medication*    . brimonidine (ALPHAGAN) 0.15 % ophthalmic solution Place 1 drop into both eyes 2 (two) times daily.     . Calcium Carb-Cholecalciferol (CALCIUM 600 + D PO) Take 1 tablet by mouth 2 (two) times daily.    . dorzolamide (TRUSOPT) 2 % ophthalmic solution Place 1 drop into both eyes 2 (two) times daily.     Marland Kitchen gabapentin (NEURONTIN) 400 MG capsule Take 400 mg by mouth 3 (three) times daily.     Marland Kitchen latanoprost (XALATAN) 0.005 % ophthalmic solution Place 1 drop into both eyes at bedtime.     . Multiple Vitamin (MULTIVITAMIN) tablet Take 1 tablet by mouth daily.    Marland Kitchen nystatin (MYCOSTATIN) 100000 UNIT/ML suspension Take 5 mLs by mouth 4 (four) times daily. X 14 days.    . predniSONE (DELTASONE) 5 MG tablet Take 5 mg by mouth daily with breakfast.     . tiotropium (SPIRIVA HANDIHALER) 18 MCG inhalation capsule Place 18 mcg into inhaler and inhale daily.     Alveda Reasons 20 MG TABS tablet Take 20 mg by mouth daily with supper.     . diclofenac sodium (VOLTAREN) 1 % GEL Apply 4 g topically 4 (four) times daily. (Patient not taking: Reported on 01/02/2016) 100 g 4  . doxycycline  (VIBRA-TABS) 100 MG tablet Take 1 tablet (100 mg total) by mouth 2 (two) times daily. (Patient not taking: Reported on 01/02/2016) 14 tablet 0  . fluconazole (DIFLUCAN) 200 MG tablet Take 1 tablet (200 mg total) by mouth daily. (Patient not taking: Reported on 01/02/2016) 14 tablet 0  . HYDROcodone-acetaminophen (NORCO/VICODIN) 5-325 MG tablet Take 1 tablet by mouth every 6 (six) hours as needed for moderate pain. (Patient not taking: Reported on 01/02/2016) 20 tablet 0   No facility-administered medications prior to visit.    Allergies  Allergen Reactions  . Timolol Maleate [Timolol Maleate] Shortness Of Breath  . Budesonide-Formoterol Fumarate Other (See Comments)    Patient reports dizziness and trembling/shaky feeling.  . Lisinopril     cough    Patient Care Team: Jerrol Banana., MD as PCP - General (  Family Medicine)  Objective:   Vitals:  Filed Vitals:   01/02/16 1036  BP: 112/64  Pulse: 86  Temp: 98 F (36.7 C)  TempSrc: Oral  Resp: 16  Height: 6\' 2"  (1.88 m)  Weight: 176 lb (79.833 kg)    Physical Exam  Constitutional: He is oriented to person, place, and time. He appears well-developed and well-nourished.  HENT:  Head: Normocephalic and atraumatic.  Right Ear: External ear normal.  Left Ear: External ear normal.  Nose: Nose normal.  Mouth/Throat: Oropharynx is clear and moist.  Eyes: Conjunctivae and EOM are normal. Pupils are equal, round, and reactive to light.  Neck: Normal range of motion. Neck supple.  Cardiovascular: Normal rate, regular rhythm, normal heart sounds and intact distal pulses.   Pulmonary/Chest: Effort normal and breath sounds normal.  Abdominal: Soft. Bowel sounds are normal.  Genitourinary: Rectum normal, prostate normal and penis normal.  Musculoskeletal: Normal range of motion.  Neurological: He is alert and oriented to person, place, and time. He has normal reflexes.  Skin: Skin is warm and dry.  Psychiatric: He has a normal mood  and affect. His behavior is normal. Judgment and thought content normal.  Oropharynx clear. Tongue shows signs of monilia.  Activities of Daily Living In your present state of health, do you have any difficulty performing the following activities: 01/02/2016  Hearing? N  Vision? N  Difficulty concentrating or making decisions? N  Walking or climbing stairs? N  Dressing or bathing? N  Doing errands, shopping? N    Fall Risk Assessment Fall Risk  01/02/2016 12/21/2014  Falls in the past year? No No     Depression Screen PHQ 2/9 Scores 01/02/2016 12/21/2014  PHQ - 2 Score 0 0    Cognitive Testing - 6-CIT    Year: 0 points  Month: 0 points  Memorize "Win, Raymundo, [address], [place]"  Time (within 1 hour:) 0 points  Count backwards from 20: 0 points  Name months of year: 0  points  Repeat Address: 2 points   Total Score: 2/28  Interpretation : Normal (0-7) Abnormal (8-28)    Assessment & Plan:     Annual Wellness Visit  Reviewed patient's Family Medical History Reviewed and updated list of patient's medical providers Assessment of cognitive impairment was done Assessed patient's functional ability Established a written schedule for health screening [place] Completed and Reviewed  Exercise Activities and Dietary recommendations Goals    None      Immunization History  Administered Date(s) Administered  . Influenza Split 03/02/2012, 03/17/2013, 05/07/2014  . Influenza Whole 03/24/2007, 03/15/2009, 02/15/2010  . Influenza, High Dose Seasonal PF 05/04/2015  . Pneumococcal Conjugate-13 11/17/2013  . Pneumococcal Polysaccharide-23 11/19/2004, 03/19/2007, 03/17/2009  . Td 05/16/1997, 05/14/2007, 09/11/2010  . Zoster 01/02/2011    Health Maintenance  Topic Date Due  . INFLUENZA VACCINE  01/16/2016  . TETANUS/TDAP  09/10/2020  . ZOSTAVAX  Completed  . PNA vac Low Risk Adult  Completed      Discussed health benefits of physical activity,  and encouraged him to engage in regular exercise appropriate for his age and condition.  1. Medicare annual wellness visit, subsequent  2. Sore throat  - POCT rapid strep A negative.  3. Essential hypertension  - TSH  4. CAP (community acquired pneumonia)  - CBC with Differential/Platelet  5. Abnormal liver enzymes Workup as indicated. - Comprehensive metabolic panel 6. Recent candidia pharyngitis   [facility] 01/02/2016 10:43 AM  ------------------------------------------------------------------------------------------------------------

## 2016-01-03 ENCOUNTER — Encounter: Payer: Self-pay | Admitting: Podiatry

## 2016-01-03 ENCOUNTER — Ambulatory Visit (INDEPENDENT_AMBULATORY_CARE_PROVIDER_SITE_OTHER): Payer: PPO | Admitting: Podiatry

## 2016-01-03 DIAGNOSIS — M779 Enthesopathy, unspecified: Principal | ICD-10-CM

## 2016-01-03 DIAGNOSIS — M129 Arthropathy, unspecified: Secondary | ICD-10-CM | POA: Diagnosis not present

## 2016-01-03 DIAGNOSIS — M7751 Other enthesopathy of right foot: Secondary | ICD-10-CM | POA: Diagnosis not present

## 2016-01-03 DIAGNOSIS — M778 Other enthesopathies, not elsewhere classified: Secondary | ICD-10-CM

## 2016-01-03 DIAGNOSIS — M19079 Primary osteoarthritis, unspecified ankle and foot: Secondary | ICD-10-CM

## 2016-01-03 LAB — COMPREHENSIVE METABOLIC PANEL
ALT: 22 IU/L (ref 0–44)
AST: 32 IU/L (ref 0–40)
Albumin/Globulin Ratio: 1.7 (ref 1.2–2.2)
Albumin: 4 g/dL (ref 3.5–4.8)
Alkaline Phosphatase: 82 IU/L (ref 39–117)
BUN/Creatinine Ratio: 13 (ref 10–24)
BUN: 14 mg/dL (ref 8–27)
Bilirubin Total: 0.6 mg/dL (ref 0.0–1.2)
CALCIUM: 9.9 mg/dL (ref 8.6–10.2)
CO2: 30 mmol/L — AB (ref 18–29)
CREATININE: 1.12 mg/dL (ref 0.76–1.27)
Chloride: 97 mmol/L (ref 96–106)
GFR calc Af Amer: 73 mL/min/{1.73_m2} (ref >59)
GFR, EST NON AFRICAN AMERICAN: 63 mL/min/{1.73_m2} (ref >59)
GLOBULIN, TOTAL: 2.4 g/dL (ref 1.5–4.5)
GLUCOSE: 96 mg/dL (ref 65–99)
Potassium: 4.8 mmol/L (ref 3.5–5.2)
SODIUM: 139 mmol/L (ref 134–144)
Total Protein: 6.4 g/dL (ref 6.0–8.5)

## 2016-01-03 LAB — CBC WITH DIFFERENTIAL/PLATELET
BASOS ABS: 0 10*3/uL (ref 0.0–0.2)
Basos: 0 %
EOS (ABSOLUTE): 0.1 10*3/uL (ref 0.0–0.4)
Eos: 1 %
HEMOGLOBIN: 15.5 g/dL (ref 12.6–17.7)
Hematocrit: 45.8 % (ref 37.5–51.0)
Immature Grans (Abs): 0.1 10*3/uL (ref 0.0–0.1)
Immature Granulocytes: 1 %
LYMPHS ABS: 1.1 10*3/uL (ref 0.7–3.1)
LYMPHS: 12 %
MCH: 32.8 pg (ref 26.6–33.0)
MCHC: 33.8 g/dL (ref 31.5–35.7)
MCV: 97 fL (ref 79–97)
MONOCYTES: 8 %
Monocytes Absolute: 0.8 10*3/uL (ref 0.1–0.9)
Neutrophils Absolute: 7.5 10*3/uL — ABNORMAL HIGH (ref 1.4–7.0)
Neutrophils: 78 %
PLATELETS: 208 10*3/uL (ref 150–379)
RBC: 4.72 x10E6/uL (ref 4.14–5.80)
RDW: 14.5 % (ref 12.3–15.4)
WBC: 9.6 10*3/uL (ref 3.4–10.8)

## 2016-01-03 LAB — TSH: TSH: 1.26 u[IU]/mL (ref 0.450–4.500)

## 2016-01-03 MED ORDER — MELOXICAM 15 MG PO TABS: 15.0000 mg | ORAL_TABLET | Freq: Every day | ORAL | Status: DC

## 2016-01-03 NOTE — Progress Notes (Signed)
He presents today for follow-up of his injection between the first and second metatarsals at the bases. He states that injection seemed to help a lot however he's experiencing pain to the lateral aspect of the foot now.  Objective: Vital signs are stable he's alert and oriented 3. He has pain on palpation of the fourth and fifth metatarsal cuboid articulation in the calcaneal cuboid articulation. He has no pain with direct palpation of the Cuboid bone.  Assessment: Osteoarthritis capsulitis lateral aspect of the right foot.  Plan: Injected the area with Kenalog and local anesthetic placed him on meloxicam for short period because he is currently taking a blood thinner.

## 2016-01-05 ENCOUNTER — Encounter: Payer: Self-pay | Admitting: Family Medicine

## 2016-01-15 ENCOUNTER — Ambulatory Visit: Payer: PPO | Admitting: Podiatry

## 2016-01-29 DIAGNOSIS — H401132 Primary open-angle glaucoma, bilateral, moderate stage: Secondary | ICD-10-CM | POA: Diagnosis not present

## 2016-02-05 ENCOUNTER — Encounter: Payer: Self-pay | Admitting: Podiatry

## 2016-02-05 ENCOUNTER — Ambulatory Visit (INDEPENDENT_AMBULATORY_CARE_PROVIDER_SITE_OTHER): Payer: PPO | Admitting: Podiatry

## 2016-02-05 DIAGNOSIS — M778 Other enthesopathies, not elsewhere classified: Secondary | ICD-10-CM

## 2016-02-05 DIAGNOSIS — M779 Enthesopathy, unspecified: Principal | ICD-10-CM

## 2016-02-05 DIAGNOSIS — M7751 Other enthesopathy of right foot: Secondary | ICD-10-CM

## 2016-02-05 NOTE — Progress Notes (Signed)
He presents today for follow-up of capsulitis second metatarsophalangeal joint dorsal lateral aspect of the right foot. He states that currently he is 100% better and he continues to take his anti-inflammatories.  Objective: Vital signs are stable he is alert and oriented 3. Pulses are strongly palpable. No reproducible pain on palpation today.  Assessment: Well-healing capsulitis right foot.  Plan: Discontinue the use of the anti-inflammatories and only use them periodically for week and a time with any flareups. Should he develop recurrence that is not resolvable he is to notify me immediately.

## 2016-02-12 DIAGNOSIS — J4530 Mild persistent asthma, uncomplicated: Secondary | ICD-10-CM | POA: Diagnosis not present

## 2016-02-21 DIAGNOSIS — J4530 Mild persistent asthma, uncomplicated: Secondary | ICD-10-CM | POA: Diagnosis not present

## 2016-02-21 DIAGNOSIS — J8489 Other specified interstitial pulmonary diseases: Secondary | ICD-10-CM | POA: Diagnosis not present

## 2016-02-22 DIAGNOSIS — I10 Essential (primary) hypertension: Secondary | ICD-10-CM | POA: Diagnosis not present

## 2016-02-22 DIAGNOSIS — M7989 Other specified soft tissue disorders: Secondary | ICD-10-CM | POA: Diagnosis not present

## 2016-02-22 DIAGNOSIS — I776 Arteritis, unspecified: Secondary | ICD-10-CM | POA: Diagnosis not present

## 2016-02-22 DIAGNOSIS — Z87891 Personal history of nicotine dependence: Secondary | ICD-10-CM | POA: Diagnosis not present

## 2016-02-22 DIAGNOSIS — R490 Dysphonia: Secondary | ICD-10-CM | POA: Diagnosis not present

## 2016-02-22 DIAGNOSIS — J189 Pneumonia, unspecified organism: Secondary | ICD-10-CM | POA: Diagnosis not present

## 2016-02-22 DIAGNOSIS — Z7951 Long term (current) use of inhaled steroids: Secondary | ICD-10-CM | POA: Diagnosis not present

## 2016-02-22 DIAGNOSIS — G629 Polyneuropathy, unspecified: Secondary | ICD-10-CM | POA: Diagnosis not present

## 2016-02-22 DIAGNOSIS — Z803 Family history of malignant neoplasm of breast: Secondary | ICD-10-CM | POA: Diagnosis not present

## 2016-02-22 DIAGNOSIS — Z79899 Other long term (current) drug therapy: Secondary | ICD-10-CM | POA: Diagnosis not present

## 2016-02-22 DIAGNOSIS — R5383 Other fatigue: Secondary | ICD-10-CM | POA: Diagnosis not present

## 2016-02-22 DIAGNOSIS — J449 Chronic obstructive pulmonary disease, unspecified: Secondary | ICD-10-CM | POA: Diagnosis not present

## 2016-02-22 DIAGNOSIS — Z8249 Family history of ischemic heart disease and other diseases of the circulatory system: Secondary | ICD-10-CM | POA: Diagnosis not present

## 2016-02-22 DIAGNOSIS — J329 Chronic sinusitis, unspecified: Secondary | ICD-10-CM | POA: Diagnosis not present

## 2016-02-22 DIAGNOSIS — Z86718 Personal history of other venous thrombosis and embolism: Secondary | ICD-10-CM | POA: Diagnosis not present

## 2016-03-21 ENCOUNTER — Ambulatory Visit (INDEPENDENT_AMBULATORY_CARE_PROVIDER_SITE_OTHER): Payer: PPO

## 2016-03-21 DIAGNOSIS — Z23 Encounter for immunization: Secondary | ICD-10-CM | POA: Diagnosis not present

## 2016-03-23 ENCOUNTER — Ambulatory Visit: Payer: PPO

## 2016-03-27 DIAGNOSIS — J328 Other chronic sinusitis: Secondary | ICD-10-CM | POA: Diagnosis not present

## 2016-06-24 DIAGNOSIS — J329 Chronic sinusitis, unspecified: Secondary | ICD-10-CM | POA: Diagnosis not present

## 2016-06-24 DIAGNOSIS — R682 Dry mouth, unspecified: Secondary | ICD-10-CM | POA: Diagnosis not present

## 2016-06-24 DIAGNOSIS — I10 Essential (primary) hypertension: Secondary | ICD-10-CM | POA: Diagnosis not present

## 2016-06-24 DIAGNOSIS — Z7951 Long term (current) use of inhaled steroids: Secondary | ICD-10-CM | POA: Diagnosis not present

## 2016-06-24 DIAGNOSIS — Z87891 Personal history of nicotine dependence: Secondary | ICD-10-CM | POA: Diagnosis not present

## 2016-06-24 DIAGNOSIS — R490 Dysphonia: Secondary | ICD-10-CM | POA: Diagnosis not present

## 2016-06-24 DIAGNOSIS — Z8619 Personal history of other infectious and parasitic diseases: Secondary | ICD-10-CM | POA: Diagnosis not present

## 2016-06-24 DIAGNOSIS — G629 Polyneuropathy, unspecified: Secondary | ICD-10-CM | POA: Diagnosis not present

## 2016-06-24 DIAGNOSIS — Z86718 Personal history of other venous thrombosis and embolism: Secondary | ICD-10-CM | POA: Diagnosis not present

## 2016-06-24 DIAGNOSIS — I776 Arteritis, unspecified: Secondary | ICD-10-CM | POA: Diagnosis not present

## 2016-06-24 DIAGNOSIS — J449 Chronic obstructive pulmonary disease, unspecified: Secondary | ICD-10-CM | POA: Diagnosis not present

## 2016-06-24 DIAGNOSIS — Z7901 Long term (current) use of anticoagulants: Secondary | ICD-10-CM | POA: Diagnosis not present

## 2016-06-24 DIAGNOSIS — R600 Localized edema: Secondary | ICD-10-CM | POA: Diagnosis not present

## 2016-06-24 DIAGNOSIS — Z8249 Family history of ischemic heart disease and other diseases of the circulatory system: Secondary | ICD-10-CM | POA: Diagnosis not present

## 2016-06-24 DIAGNOSIS — R5383 Other fatigue: Secondary | ICD-10-CM | POA: Diagnosis not present

## 2016-06-26 ENCOUNTER — Ambulatory Visit (INDEPENDENT_AMBULATORY_CARE_PROVIDER_SITE_OTHER): Payer: PPO | Admitting: Podiatry

## 2016-06-26 ENCOUNTER — Encounter: Payer: Self-pay | Admitting: Podiatry

## 2016-06-26 DIAGNOSIS — M778 Other enthesopathies, not elsewhere classified: Secondary | ICD-10-CM

## 2016-06-26 DIAGNOSIS — M779 Enthesopathy, unspecified: Principal | ICD-10-CM

## 2016-06-26 DIAGNOSIS — M7751 Other enthesopathy of right foot: Secondary | ICD-10-CM | POA: Diagnosis not present

## 2016-06-26 MED ORDER — MELOXICAM 15 MG PO TABS: 15.0000 mg | ORAL_TABLET | Freq: Every day | ORAL | 3 refills | Status: DC

## 2016-06-26 NOTE — Progress Notes (Signed)
He presents today for follow-up of his Lisfranc's joint capsulitis first intermetatarsal space proximally right foot. States that he does better with the injections but it does hurt all the time.  Objective: Vital signs are stable he is alert and oriented 3. Pulses are palpable. Neurologic sensorium is intact. Deep tendon reflexes are intact. Muscle strength is normal. He has tenderness on palpation Roxanol first intermetatarsal space of the right foot.  Assessment: Capsulitis Lisfranc's joint proximal first intermetatarsal space.  Plan: I injected the area today with Kenalog and local anesthetic follow-up with him on an as-needed basis.

## 2016-07-22 DIAGNOSIS — H401132 Primary open-angle glaucoma, bilateral, moderate stage: Secondary | ICD-10-CM | POA: Diagnosis not present

## 2016-07-29 DIAGNOSIS — L821 Other seborrheic keratosis: Secondary | ICD-10-CM | POA: Diagnosis not present

## 2016-07-29 DIAGNOSIS — L309 Dermatitis, unspecified: Secondary | ICD-10-CM | POA: Diagnosis not present

## 2016-07-29 DIAGNOSIS — L578 Other skin changes due to chronic exposure to nonionizing radiation: Secondary | ICD-10-CM | POA: Diagnosis not present

## 2016-07-29 DIAGNOSIS — D692 Other nonthrombocytopenic purpura: Secondary | ICD-10-CM | POA: Diagnosis not present

## 2016-07-29 DIAGNOSIS — D229 Melanocytic nevi, unspecified: Secondary | ICD-10-CM | POA: Diagnosis not present

## 2016-07-29 DIAGNOSIS — L80 Vitiligo: Secondary | ICD-10-CM | POA: Diagnosis not present

## 2016-07-29 DIAGNOSIS — D485 Neoplasm of uncertain behavior of skin: Secondary | ICD-10-CM | POA: Diagnosis not present

## 2016-07-29 DIAGNOSIS — H401132 Primary open-angle glaucoma, bilateral, moderate stage: Secondary | ICD-10-CM | POA: Diagnosis not present

## 2016-07-29 DIAGNOSIS — Z1283 Encounter for screening for malignant neoplasm of skin: Secondary | ICD-10-CM | POA: Diagnosis not present

## 2016-07-29 DIAGNOSIS — D1800 Hemangioma unspecified site: Secondary | ICD-10-CM | POA: Diagnosis not present

## 2016-07-29 DIAGNOSIS — L570 Actinic keratosis: Secondary | ICD-10-CM | POA: Diagnosis not present

## 2016-08-18 ENCOUNTER — Inpatient Hospital Stay: Payer: PPO

## 2016-08-18 ENCOUNTER — Encounter: Payer: Self-pay | Admitting: Emergency Medicine

## 2016-08-18 ENCOUNTER — Inpatient Hospital Stay
Admission: EM | Admit: 2016-08-18 | Discharge: 2016-08-20 | DRG: 281 | Disposition: A | Discharge status: Home / Self Care | Payer: PPO | Attending: Internal Medicine | Admitting: Internal Medicine

## 2016-08-18 ENCOUNTER — Inpatient Hospital Stay
Admit: 2016-08-18 | Discharge: 2016-08-18 | Disposition: A | Discharge status: Home / Self Care | Payer: PPO | Attending: Cardiovascular Disease | Admitting: Cardiovascular Disease

## 2016-08-18 DIAGNOSIS — I214 Non-ST elevation (NSTEMI) myocardial infarction: Secondary | ICD-10-CM

## 2016-08-18 DIAGNOSIS — E860 Dehydration: Secondary | ICD-10-CM | POA: Diagnosis present

## 2016-08-18 DIAGNOSIS — Z7901 Long term (current) use of anticoagulants: Secondary | ICD-10-CM | POA: Diagnosis not present

## 2016-08-18 DIAGNOSIS — I959 Hypotension, unspecified: Secondary | ICD-10-CM | POA: Diagnosis not present

## 2016-08-18 DIAGNOSIS — H409 Unspecified glaucoma: Secondary | ICD-10-CM | POA: Diagnosis present

## 2016-08-18 DIAGNOSIS — Z79899 Other long term (current) drug therapy: Secondary | ICD-10-CM

## 2016-08-18 DIAGNOSIS — Z86718 Personal history of other venous thrombosis and embolism: Secondary | ICD-10-CM

## 2016-08-18 DIAGNOSIS — I82432 Acute embolism and thrombosis of left popliteal vein: Secondary | ICD-10-CM | POA: Diagnosis not present

## 2016-08-18 DIAGNOSIS — N179 Acute kidney failure, unspecified: Secondary | ICD-10-CM | POA: Diagnosis not present

## 2016-08-18 DIAGNOSIS — Z888 Allergy status to other drugs, medicaments and biological substances status: Secondary | ICD-10-CM

## 2016-08-18 DIAGNOSIS — I252 Old myocardial infarction: Secondary | ICD-10-CM

## 2016-08-18 DIAGNOSIS — R112 Nausea with vomiting, unspecified: Secondary | ICD-10-CM | POA: Diagnosis not present

## 2016-08-18 DIAGNOSIS — I059 Rheumatic mitral valve disease, unspecified: Secondary | ICD-10-CM | POA: Diagnosis not present

## 2016-08-18 DIAGNOSIS — Z87891 Personal history of nicotine dependence: Secondary | ICD-10-CM | POA: Diagnosis not present

## 2016-08-18 DIAGNOSIS — I10 Essential (primary) hypertension: Secondary | ICD-10-CM | POA: Diagnosis not present

## 2016-08-18 DIAGNOSIS — M810 Age-related osteoporosis without current pathological fracture: Secondary | ICD-10-CM | POA: Diagnosis present

## 2016-08-18 DIAGNOSIS — O2230 Deep phlebothrombosis in pregnancy, unspecified trimester: Secondary | ICD-10-CM

## 2016-08-18 DIAGNOSIS — I471 Supraventricular tachycardia: Secondary | ICD-10-CM | POA: Diagnosis not present

## 2016-08-18 DIAGNOSIS — G4700 Insomnia, unspecified: Secondary | ICD-10-CM | POA: Diagnosis not present

## 2016-08-18 DIAGNOSIS — I82402 Acute embolism and thrombosis of unspecified deep veins of left lower extremity: Secondary | ICD-10-CM

## 2016-08-18 DIAGNOSIS — J449 Chronic obstructive pulmonary disease, unspecified: Secondary | ICD-10-CM | POA: Diagnosis present

## 2016-08-18 DIAGNOSIS — R55 Syncope and collapse: Secondary | ICD-10-CM | POA: Diagnosis not present

## 2016-08-18 HISTORY — DX: Non-ST elevation (NSTEMI) myocardial infarction: I21.4

## 2016-08-18 HISTORY — DX: Acute embolism and thrombosis of unspecified deep veins of unspecified lower extremity: I82.409

## 2016-08-18 LAB — BASIC METABOLIC PANEL
ANION GAP: 7 (ref 5–15)
Anion gap: 7 (ref 5–15)
BUN: 17 mg/dL (ref 6–20)
BUN: 17 mg/dL (ref 6–20)
CALCIUM: 8.9 mg/dL (ref 8.9–10.3)
CO2: 30 mmol/L (ref 22–32)
CO2: 24 mmol/L (ref 22–32)
CREATININE: 1.29 mg/dL — AB (ref 0.61–1.24)
Calcium: 8.4 mg/dL — ABNORMAL LOW (ref 8.9–10.3)
Chloride: 95 mmol/L — ABNORMAL LOW (ref 101–111)
Chloride: 99 mmol/L — ABNORMAL LOW (ref 101–111)
Creatinine, Ser: 1.61 mg/dL — ABNORMAL HIGH (ref 0.61–1.24)
GFR calc Af Amer: 46 mL/min — ABNORMAL LOW (ref >60)
GFR calc Af Amer: >60 mL/min (ref >60)
GFR, EST NON AFRICAN AMERICAN: 40 mL/min — AB (ref >60)
GFR, EST NON AFRICAN AMERICAN: 52 mL/min — AB (ref >60)
GLUCOSE: 102 mg/dL — AB (ref 65–99)
GLUCOSE: 98 mg/dL (ref 65–99)
POTASSIUM: 3.8 mmol/L (ref 3.5–5.1)
Potassium: 3.9 mmol/L (ref 3.5–5.1)
SODIUM: 132 mmol/L — AB (ref 135–145)
SODIUM: 130 mmol/L — AB (ref 135–145)

## 2016-08-18 LAB — TROPONIN I
TROPONIN I: 7.73 ng/mL — AB (ref <0.03)
TROPONIN I: 6.83 ng/mL — AB (ref <0.03)
Troponin I: 7.99 ng/mL (ref <0.03)

## 2016-08-18 LAB — CBC
HCT: 44.7 % (ref 40.0–52.0)
HEMATOCRIT: 48.4 % (ref 40.0–52.0)
HEMOGLOBIN: 16.6 g/dL (ref 13.0–18.0)
HEMOGLOBIN: 15.2 g/dL (ref 13.0–18.0)
MCH: 32.8 pg (ref 26.0–34.0)
MCH: 32.8 pg (ref 26.0–34.0)
MCHC: 34.3 g/dL (ref 32.0–36.0)
MCHC: 34 g/dL (ref 32.0–36.0)
MCV: 95.5 fL (ref 80.0–100.0)
MCV: 96.4 fL (ref 80.0–100.0)
Platelets: 215 10*3/uL (ref 150–440)
Platelets: 180 10*3/uL (ref 150–440)
RBC: 5.07 MIL/uL (ref 4.40–5.90)
RBC: 4.63 MIL/uL (ref 4.40–5.90)
RDW: 15.2 % — AB (ref 11.5–14.5)
RDW: 15 % — ABNORMAL HIGH (ref 11.5–14.5)
WBC: 10.7 10*3/uL — AB (ref 3.8–10.6)
WBC: 7.6 10*3/uL (ref 3.8–10.6)

## 2016-08-18 LAB — LIPID PANEL
CHOL/HDL RATIO: 2.6 ratio
Cholesterol: 141 mg/dL (ref 0–200)
HDL: 55 mg/dL (ref >40)
LDL Cholesterol: 75 mg/dL (ref 0–99)
Triglycerides: 57 mg/dL (ref <150)
VLDL: 11 mg/dL (ref 0–40)

## 2016-08-18 LAB — HEPARIN LEVEL (UNFRACTIONATED)
HEPARIN UNFRACTIONATED: 1.07 [IU]/mL — AB (ref 0.30–0.70)
Heparin Unfractionated: 1.65 IU/mL — ABNORMAL HIGH (ref 0.30–0.70)

## 2016-08-18 LAB — PROTIME-INR
INR: 1.51
INR: 1.54
PROTHROMBIN TIME: 18.6 s — AB (ref 11.4–15.2)
Prothrombin Time: 18.4 seconds — ABNORMAL HIGH (ref 11.4–15.2)

## 2016-08-18 LAB — APTT
APTT: 44 s — AB (ref 24–36)
APTT: 49 s — AB (ref 24–36)

## 2016-08-18 LAB — GLUCOSE, CAPILLARY: GLUCOSE-CAPILLARY: 78 mg/dL (ref 65–99)

## 2016-08-18 LAB — LACTIC ACID, PLASMA: LACTIC ACID, VENOUS: 1.1 mmol/L (ref 0.5–1.9)

## 2016-08-18 MED ORDER — MOMETASONE FURO-FORMOTEROL FUM 200-5 MCG/ACT IN AERO
2.0000 | INHALATION_SPRAY | Freq: Two times a day (BID) | RESPIRATORY_TRACT | Status: DC
  Administered 2016-08-18 – 2016-08-20 (×4): 2 via RESPIRATORY_TRACT
  Filled 2016-08-18: qty 8.8

## 2016-08-18 MED ORDER — TIOTROPIUM BROMIDE MONOHYDRATE 18 MCG IN CAPS
18.0000 ug | ORAL_CAPSULE | Freq: Every day | RESPIRATORY_TRACT | Status: DC
  Administered 2016-08-18 – 2016-08-19 (×2): 18 ug via RESPIRATORY_TRACT
  Filled 2016-08-18: qty 5

## 2016-08-18 MED ORDER — MELOXICAM 7.5 MG PO TABS
15.0000 mg | ORAL_TABLET | Freq: Every day | ORAL | Status: DC
  Administered 2016-08-19 – 2016-08-20 (×2): 15 mg via ORAL
  Filled 2016-08-18 (×2): qty 2

## 2016-08-18 MED ORDER — ADULT MULTIVITAMIN W/MINERALS CH
1.0000 | ORAL_TABLET | Freq: Every day | ORAL | Status: DC
  Administered 2016-08-19 – 2016-08-20 (×2): 1 via ORAL
  Filled 2016-08-18 (×2): qty 1

## 2016-08-18 MED ORDER — GABAPENTIN 400 MG PO CAPS
400.0000 mg | ORAL_CAPSULE | Freq: Three times a day (TID) | ORAL | Status: DC
  Administered 2016-08-18 (×2): 400 mg via ORAL
  Filled 2016-08-18 (×3): qty 1

## 2016-08-18 MED ORDER — DOCUSATE SODIUM 100 MG PO CAPS: 100.0000 mg | ORAL_CAPSULE | Freq: Two times a day (BID) | ORAL | Status: DC | PRN

## 2016-08-18 MED ORDER — HEPARIN (PORCINE) IN NACL 100-0.45 UNIT/ML-% IJ SOLN
950.0000 [IU]/h | INTRAMUSCULAR | Status: DC
  Administered 2016-08-18: 950 [IU]/h via INTRAVENOUS
  Filled 2016-08-18: qty 250

## 2016-08-18 MED ORDER — AZITHROMYCIN 250 MG PO TABS
250.0000 mg | ORAL_TABLET | Freq: Every day | ORAL | Status: DC
  Administered 2016-08-19 – 2016-08-20 (×2): 250 mg via ORAL
  Filled 2016-08-18 (×2): qty 1

## 2016-08-18 MED ORDER — SODIUM CHLORIDE 0.9 % WEIGHT BASED INFUSION: 3.0000 mL/kg/h | INTRAVENOUS | Status: AC

## 2016-08-18 MED ORDER — DORZOLAMIDE HCL 2 % OP SOLN
1.0000 [drp] | Freq: Two times a day (BID) | OPHTHALMIC | Status: DC
  Administered 2016-08-19 – 2016-08-20 (×2): 1 [drp] via OPHTHALMIC
  Filled 2016-08-18: qty 10

## 2016-08-18 MED ORDER — SODIUM CHLORIDE 0.9 % IV SOLN: 250.0000 mL | INTRAVENOUS | Status: DC | PRN

## 2016-08-18 MED ORDER — SODIUM CHLORIDE 0.9 % IV BOLUS (SEPSIS)
1000.0000 mL | Freq: Once | INTRAVENOUS | Status: AC
  Administered 2016-08-18: 1000 mL via INTRAVENOUS

## 2016-08-18 MED ORDER — PREDNISONE 2.5 MG PO TABS: 5.0000 mg | ORAL_TABLET | Freq: Every day | ORAL | Status: DC

## 2016-08-18 MED ORDER — SODIUM CHLORIDE 0.9% FLUSH: 3.0000 mL | INTRAVENOUS | Status: DC | PRN

## 2016-08-18 MED ORDER — SODIUM CHLORIDE 0.9% FLUSH
3.0000 mL | Freq: Two times a day (BID) | INTRAVENOUS | Status: DC
  Administered 2016-08-18: 3 mL via INTRAVENOUS

## 2016-08-18 MED ORDER — LATANOPROST 0.005 % OP SOLN
1.0000 [drp] | Freq: Every day | OPHTHALMIC | Status: DC
  Administered 2016-08-18 – 2016-08-19 (×2): 1 [drp] via OPHTHALMIC
  Filled 2016-08-18: qty 2.5

## 2016-08-18 MED ORDER — ALBUTEROL SULFATE (2.5 MG/3ML) 0.083% IN NEBU
2.5000 mg | INHALATION_SOLUTION | Freq: Two times a day (BID) | RESPIRATORY_TRACT | Status: DC
  Administered 2016-08-18 – 2016-08-20 (×4): 2.5 mg via RESPIRATORY_TRACT
  Filled 2016-08-18 (×4): qty 3

## 2016-08-18 MED ORDER — SODIUM CHLORIDE 0.9% FLUSH
3.0000 mL | Freq: Two times a day (BID) | INTRAVENOUS | Status: DC
  Administered 2016-08-20: 3 mL via INTRAVENOUS

## 2016-08-18 MED ORDER — BRIMONIDINE TARTRATE 0.15 % OP SOLN
1.0000 [drp] | Freq: Two times a day (BID) | OPHTHALMIC | Status: DC
  Administered 2016-08-19 – 2016-08-20 (×2): 1 [drp] via OPHTHALMIC
  Filled 2016-08-18: qty 5

## 2016-08-18 MED ORDER — ASPIRIN 81 MG PO CHEW: 81.0000 mg | CHEWABLE_TABLET | ORAL | Status: DC

## 2016-08-18 MED ORDER — FLUTICASONE PROPIONATE 50 MCG/ACT NA SUSP
1.0000 | Freq: Every day | NASAL | Status: DC
  Filled 2016-08-18 (×2): qty 16

## 2016-08-18 MED ORDER — NYSTATIN 100000 UNIT/ML MT SUSP: 5.0000 mL | Freq: Four times a day (QID) | OROMUCOSAL | Status: DC

## 2016-08-18 MED ORDER — ASPIRIN 81 MG PO CHEW
324.0000 mg | CHEWABLE_TABLET | Freq: Once | ORAL | Status: AC
Start: 2016-08-18 — End: 2016-08-18
  Administered 2016-08-18: 324 mg via ORAL
  Filled 2016-08-18: qty 4

## 2016-08-18 MED ORDER — SODIUM CHLORIDE 0.9 % WEIGHT BASED INFUSION: 1.0000 mL/kg/h | INTRAVENOUS | Status: DC

## 2016-08-18 MED ORDER — SODIUM CHLORIDE 0.9 % IV SOLN
INTRAVENOUS | Status: AC
  Administered 2016-08-18 – 2016-08-19 (×5): via INTRAVENOUS

## 2016-08-18 NOTE — ED Triage Notes (Signed)
Pt c/o weakness and dizziness x 3 days. Pt states he has been hypotensive x 3 days, BP at home 96/66 yesterday, today was [id number] systolically. Pt states had syncopal episode at the clinic with 1 episode of vomiting. Pt denies hitting head with syncopal episode.

## 2016-08-18 NOTE — H&P (Signed)
[name] at [place] NAME: [name]    MR#:  [id number]  DATE OF BIRTH:  [birth year]  DATE OF ADMISSION:  08/18/2016  PRIMARY CARE PHYSICIAN: Wilhemena Durie, MD   REQUESTING/REFERRING PHYSICIAN: [place]  CHIEF COMPLAINT:   Chief Complaint  Patient presents with  . Loss of Consciousness  . Emesis    HISTORY OF PRESENT ILLNESS: [name]  is a 76 y.o. male with a known history of left leg DVT, Glaucoma, Aspergilosis, COPD, mitral valve disorder- was feeling dizzi for 2 days- went to urgent care center and had a syncopal episode there. Found to have elevated Troponin and Low normal BP. Suspected to have inferior wall MI. Last xarelto was 4:30 pm last evening,. He takes it for DVT since last one year. Denies any chest pain and SOB.  PAST MEDICAL HISTORY:   Past Medical History:  Diagnosis Date  . AB (asthmatic bronchitis)   . Allergic rhinitis   . Arthropathy   . Asthma   . Cellulitis   . DVT (deep venous thrombosis) ([place])   . Foreign body in skin   . Glaucoma   . Hx MRSA infection   . Hx MRSA infection   . Hypertension   . Insomnia   . Insomnia   . Mitral valve disorder   . Osteoarthrosis   . Upper back strain     PAST SURGICAL HISTORY: Past Surgical History:  Procedure Laterality Date  . APPENDECTOMY    . BRONCHOSCOPY    . NASAL SINUS SURGERY  09/27/2014    SOCIAL HISTORY:  Social History  Substance Use Topics  . Smoking status: Former Smoker    Packs/day: 0.50    Years: 3.00    Types: Cigarettes    Quit date: 06/17/1958  . Smokeless tobacco: Never Used     Comment: smoking while in college  . Alcohol use No     Comment: OCCASSIONAL WINE ONLY    FAMILY HISTORY:  Family History  Problem Relation Age of Onset  . Breast cancer Sister   . Heart failure Mother   . Pancreatic cancer Sister   . Alzheimer's disease Father   . Brain cancer Daughter     DRUG ALLERGIES:  Allergies  Allergen Reactions  . Timolol  Maleate [Timolol Maleate] Shortness Of Breath  . Budesonide-Formoterol Fumarate Other (See Comments)    Patient reports dizziness and trembling/shaky feeling.  . Lisinopril     cough    REVIEW OF SYSTEMS:   CONSTITUTIONAL: No fever, fatigue or weakness.  EYES: No blurred or double vision.  EARS, NOSE, AND THROAT: No tinnitus or ear pain.  RESPIRATORY: No cough, shortness of breath, wheezing or hemoptysis.  CARDIOVASCULAR: No chest pain, orthopnea, edema.  GASTROINTESTINAL: No nausea, vomiting, diarrhea or abdominal pain.  GENITOURINARY: No dysuria, hematuria.  ENDOCRINE: No polyuria, nocturia,  HEMATOLOGY: No anemia, easy bruising or bleeding SKIN: No rash or lesion. MUSCULOSKELETAL: No joint pain or arthritis.   NEUROLOGIC: No tingling, numbness, weakness.  PSYCHIATRY: No anxiety or depression.   MEDICATIONS AT HOME:  Prior to Admission medications   Medication Sig Start Date End Date Taking? Authorizing Provider  ADVAIR HFA 230-21 MCG/ACT inhaler Inhale 2 puffs into the lungs 2 (two) times daily.  11/17/15  Yes Historical Provider, MD  azithromycin (ZITHROMAX) 250 MG tablet Take 250 mg by mouth daily.  12/23/15  Yes Historical Provider, MD  brimonidine (ALPHAGAN) 0.15 % ophthalmic solution Place 1 drop into both  eyes 2 (two) times daily.  10/26/15  Yes Historical Provider, MD  Calcium Carb-Cholecalciferol (CALCIUM 600 + D PO) Take 1 tablet by mouth 2 (two) times daily.   Yes Historical Provider, MD  dorzolamide (TRUSOPT) 2 % ophthalmic solution Place 1 drop into both eyes 2 (two) times daily.  08/31/15  Yes Historical Provider, MD  fluticasone (FLONASE) 50 MCG/ACT nasal spray 2 sprays by Each Nare route Two (2) times a day. 03/27/16 03/27/17 Yes Historical Provider, MD  gabapentin (NEURONTIN) 400 MG capsule Take 400 mg by mouth daily.  09/18/15  Yes Historical Provider, MD  latanoprost (XALATAN) 0.005 % ophthalmic solution Place 1 drop into both eyes at bedtime.    Yes Historical Provider,  MD  meloxicam (MOBIC) 15 MG tablet Take 1 tablet (15 mg total) by mouth daily. 06/26/16  Yes Max T Hyatt, DPM  Multiple Vitamin (MULTIVITAMIN) tablet Take 1 tablet by mouth daily.   Yes Historical Provider, MD  tiotropium (SPIRIVA HANDIHALER) 18 MCG inhalation capsule Place 18 mcg into inhaler and inhale daily.  02/06/15 08/18/17 Yes Historical Provider, MD  XARELTO 20 MG TABS tablet Take 20 mg by mouth daily with supper.  09/11/15  Yes Historical Provider, MD      PHYSICAL EXAMINATION:   VITAL SIGNS: Blood pressure 128/76, pulse 88, temperature 97.3 F (36.3 C), temperature source Oral, resp. rate (!) 22, height 6\' 2"  (1.88 m), weight 79.4 kg (175 lb), SpO2 95 %.  GENERAL:  76 y.o.-year-old patient lying in the bed with no acute distress.  EYES: Pupils equal, round, reactive to light and accommodation. No scleral icterus. Extraocular muscles intact.  HEENT: Head atraumatic, normocephalic. Oropharynx and nasopharynx clear.  NECK:  Supple, no jugular venous distention. No thyroid enlargement, no tenderness.  LUNGS: Normal breath sounds bilaterally, no wheezing, rales,rhonchi or crepitation. No use of accessory muscles of respiration.  CARDIOVASCULAR: S1, S2 normal. No murmurs, rubs, or gallops.  ABDOMEN: Soft, nontender, nondistended. Bowel sounds present. No organomegaly or mass.  EXTREMITIES: No pedal edema, cyanosis, or clubbing.  NEUROLOGIC: Cranial nerves II through XII are intact. Muscle strength 5/5 in all extremities. Sensation intact. Gait not checked.  PSYCHIATRIC: The patient is alert and oriented x 3.  SKIN: No obvious rash, lesion, or ulcer.   LABORATORY PANEL:   CBC  Recent Labs Lab 08/18/16 1118  WBC 10.7*  HGB 16.6  HCT 48.4  PLT 215  MCV 95.5  MCH 32.8  MCHC 34.3  RDW 15.2*   ------------------------------------------------------------------------------------------------------------------  Chemistries   Recent Labs Lab 08/18/16 1118  NA 132*  K 3.8  CL 95*   CO2 30  GLUCOSE 102*  BUN 17  CREATININE 1.61*  CALCIUM 8.9   ------------------------------------------------------------------------------------------------------------------ estimated creatinine clearance is 43.2 mL/min (by C-G formula based on SCr of 1.61 mg/dL (H)). ------------------------------------------------------------------------------------------------------------------ No results for input(s): TSH, T4TOTAL, T3FREE, THYROIDAB in the last 72 hours.  Invalid input(s): FREET3   Coagulation profile No results for input(s): INR, PROTIME in the last 168 hours. ------------------------------------------------------------------------------------------------------------------- No results for input(s): DDIMER in the last 72 hours. -------------------------------------------------------------------------------------------------------------------  Cardiac Enzymes  Recent Labs Lab 08/18/16 1118  TROPONINI 7.73*   ------------------------------------------------------------------------------------------------------------------ Invalid input(s): POCBNP  ---------------------------------------------------------------------------------------------------------------  Urinalysis    Component Value Date/Time   COLORURINE Yellow 04/07/2014 1745   APPEARANCEUR Clear 04/07/2014 1745   LABSPEC 1.012 04/07/2014 1745   PHURINE 8.0 04/07/2014 1745   GLUCOSEU Negative 04/07/2014 1745   HGBUR Negative 04/07/2014 1745   BILIRUBINUR negative 10/03/2015 1008   BILIRUBINUR Negative 04/07/2014  [place] Negative 04/07/2014 1745   PROTEINUR negative 10/03/2015 1008   PROTEINUR Negative 04/07/2014 1745   UROBILINOGEN 0.2 10/03/2015 1008   NITRITE negative 10/03/2015 1008   NITRITE Negative 04/07/2014 1745   LEUKOCYTESUR Negative 10/03/2015 1008   LEUKOCYTESUR Negative 04/07/2014 1745     RADIOLOGY: No results found.  EKG: Orders placed or performed during the hospital  encounter of 08/18/16  . EKG 12-Lead  . EKG 12-Lead  . ED EKG  . ED EKG  . ED EKG  . ED EKG  . EKG 12-Lead  . EKG 12-Lead    IMPRESSION AND PLAN:  * NSTEMi   IV heparin after 24 hrs of xarelto.   Pharmacy to help with drip.   Tele, serial troponin, Echo.   IV fluid bolus and now drip.   Check Lipids and HBA1c.  * Hx of DVT- 1 year ago.   He may not need xarelto from now, need to decide on discharge.  * Glaucoma   Cont eye drops.  * COPD with hx of aspergillosis   Cont inhalers.  * Ac renal insufficiency   Need to check again with IV fluids tomorrow.   Was seen by nephrologist in past, to r/o vasculitis.  All the records are reviewed and case discussed with ED provider. Management plans discussed with the patient, family and they are in agreement.  CODE STATUS: Full. Code Status History    Date Active Date Inactive Code Status Order ID Comments User Context   11/04/2014  7:09 AM 11/09/2014  2:50 PM Full Code [id number]  Molly Maduro, MD ED    Advance Directive Documentation   Flowsheet Row Most Recent Value  Type of Advance Directive  Healthcare Power of Attorney, Living will  Pre-existing out of facility DNR order (yellow form or pink MOST form)  No data  "MOST" Form in Place?  No data     His wife was present in room.  TOTAL TIME TAKING CARE OF THIS PATIENT: 50 critical care minutes.    Vaughan Basta M.D on 08/18/2016   Between 7am to 6pm - Pager - [phone]  After 6pm go to [email or web address] - password [email or web address] [facility]  Office  [phone]  CC: Primary care physician; Wilhemena Durie, MD   Note: This dictation was prepared with Dragon dictation along with smaller phrase technology. Any transcriptional errors that result from this process are unintentional.

## 2016-08-18 NOTE — Progress Notes (Signed)
ANTICOAGULATION CONSULT NOTE - Initial Consult  Pharmacy Consult for heparin Indication: chest pain/ACS  Allergies  Allergen Reactions  . Timolol Maleate [Timolol Maleate] Shortness Of Breath  . Budesonide-Formoterol Fumarate Other (See Comments)    Patient reports dizziness and trembling/shaky feeling.  . Lisinopril     cough    Patient Measurements: Height: 6\' 2"  (188 cm) Weight: 175 lb (79.4 kg) IBW/kg (Calculated) : 82.2 Heparin Dosing Weight: 79.4kg  Vital Signs: Temp: 97.3 F (36.3 C) (03/04 1118) Temp Source: Oral (03/04 1118) BP: 99/52 (03/04 1400) Pulse Rate: 88 (03/04 1118)  Labs:  Recent Labs  08/18/16 1118  HGB 16.6  HCT 48.4  PLT 215  CREATININE 1.61*  TROPONINI 7.73*    Estimated Creatinine Clearance: 43.2 mL/min (by C-G formula based on SCr of 1.61 mg/dL (H)).   Medical History: Past Medical History:  Diagnosis Date  . AB (asthmatic bronchitis)   . Allergic rhinitis   . Arthropathy   . Asthma   . Cellulitis   . DVT (deep venous thrombosis) ([place])   . Foreign body in skin   . Glaucoma   . Hx MRSA infection   . Hx MRSA infection   . Hypertension   . Insomnia   . Insomnia   . Mitral valve disorder   . Osteoarthrosis   . Upper back strain    Assessment: 76yo M who presents with loss of consciousness. Patient has an MI.  Patient is on Xarelto at home. Last dose at 08/17/16 at 1630. Patient would be able to have cardiac catheterization 48hrs after last Xarelto dose.   Baseline labs checked.   Goal of Therapy:  Heparin level 0.3-0.7 units/ml aPTT 68- 109 seconds Monitor platelets by anticoagulation protocol: Yes   Plan:  Start heparin infusion at 950 units/hr Check anti-Xa level in 6 hours and daily while on heparin Continue to monitor H&H and platelets APTT and HL in [place], PharmD 08/18/2016,2:35 PM

## 2016-08-18 NOTE — ED Notes (Signed)
Patient sitting in chair in lobby, wife to desk states patient passed out again for less than 30 seconds as he was not talking to her or responding to her.  Patient helped into wheelchair and taken to a room.  Patient was responding and able to talk and answer questions.

## 2016-08-18 NOTE — ED Notes (Signed)
Pt brought back to room 12 after wife stated while they were waiting in the waiting room the pt passed out again. Pt states he believes he fell asleep. Pt hooked up to monitor. Labs previously drawn. Pt fell asleep while this nurse documenting. Pt states on long term antibiotic for an unknown chronic lung problem.

## 2016-08-18 NOTE — Progress Notes (Signed)
[name] is a 76 y.o. male  [id number]  Primary Cardiologist: Neoma Laming Reason for Consultation: Non-STEMI  HPI: 76 year old white male with a past medical history history of hypotension about 110/70 blood pressure followed by Dr. Rosanna Randy with history of DVT of the left leg 12 months ago and is on Xarelto 20 mg once a day. He presented to the hospital with elevated troponin after having a [facility]opal episode [facility]. Patient states that he's been feeling bad for the last 4 days which she is unable to describe. He denies any chest pain shortness of breath but had dizziness and passed out today. His blood pressure right now is 99/52. He never had chest pain in 4 days that he's been feeling bad.   Review of Systems: No orthopnea PND or leg swelling   Past Medical History:  Diagnosis Date  . AB (asthmatic bronchitis)   . Allergic rhinitis   . Arthropathy   . Asthma   . Cellulitis   . Foreign body in skin   . Glaucoma   . Hx MRSA infection   . Hx MRSA infection   . Hypertension   . Insomnia   . Insomnia   . Mitral valve disorder   . Osteoarthrosis   . Upper back strain      (Not in a hospital admission)   . aspirin  324 mg Oral Once    Infusions:   Allergies  Allergen Reactions  . Timolol Maleate [Timolol Maleate] Shortness Of Breath  . Budesonide-Formoterol Fumarate Other (See Comments)    Patient reports dizziness and trembling/shaky feeling.  . Lisinopril     cough    Social History   Social History  . Marital status: Married    Spouse name: Everlene Farrier  . Number of children: 2  . Years of education: College   Occupational History  . Retired Retired    Chief Financial Officer   Social History Main Topics  . Smoking status: Former Smoker    Packs/day: 0.50    Years: 3.00    Types: Cigarettes    Quit date: 06/17/1958  . Smokeless tobacco: Never Used     Comment: smoking while in college  . Alcohol use No     Comment: OCCASSIONAL WINE  ONLY  . Drug use: No  . Sexual activity: Not on file   Other Topics Concern  . Not on file   Social History Narrative   He is a married father of 2 daughters.   He smoked casually while in college for about 3 years less than a pack a day.   He drinks 3-4 glass of wine at night. He also has about 2 servings of coffee a day   He is a very active gentleman with [employer] exercise program at least 3-4 days a week.   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   In addition to this standing his exercise, he does lots of gardening and uses a chain saw to cut wood.             Family History  Problem Relation Age of Onset  . Breast cancer Sister   . Heart failure Mother   . Pancreatic cancer Sister   . Alzheimer's disease Father   . Brain cancer Daughter     PHYSICAL EXAM: Vitals:   08/18/16 1330 08/18/16 1400  BP: 111/70 (!) 99/52  Pulse:    Resp: (!) 21 (!)  22  Temp:      No intake or output data in the 24 hours ending 08/18/16 1420  General:  Well appearing. No respiratory difficulty HEENT: normal Neck: supple. no JVD. Carotids 2+ bilat; no bruits. No lymphadenopathy or thryomegaly appreciated. Cor: PMI nondisplaced. Regular rate & rhythm. No rubs, gallops or murmurs. Lungs: clear Abdomen: soft, nontender, nondistended. No hepatosplenomegaly. No bruits or masses. Good bowel sounds. Extremities: no cyanosis, clubbing, rash, edema Neuro: alert & oriented x 3, cranial nerves grossly intact. moves all 4 extremities w/o difficulty. Affect pleasant.  ECG: Normal sinus rhythm with nonspecific ST-T changes but no ST elevation. There is Q-wave in the inferior leads suggestive of probably recent inferior wall MI.  Results for orders placed or performed during the hospital encounter of 08/18/16 (from the past 24 hour(s))  Basic metabolic panel     Status: Abnormal   Collection Time: 08/18/16 11:18 AM  Result Value Ref Range   Sodium 132 (L) 135 - 145  mmol/L   Potassium 3.8 3.5 - 5.1 mmol/L   Chloride 95 (L) 101 - 111 mmol/L   CO2 30 22 - 32 mmol/L   Glucose, Bld 102 (H) 65 - 99 mg/dL   BUN 17 6 - 20 mg/dL   Creatinine, Ser 1.61 (H) 0.61 - 1.24 mg/dL   Calcium 8.9 8.9 - 10.3 mg/dL   GFR calc non Af Amer 40 (L) >60 mL/min   GFR calc Af Amer 46 (L) >60 mL/min   Anion gap 7 5 - 15  CBC     Status: Abnormal   Collection Time: 08/18/16 11:18 AM  Result Value Ref Range   WBC 10.7 (H) 3.8 - 10.6 K/uL   RBC 5.07 4.40 - 5.90 MIL/uL   Hemoglobin 16.6 13.0 - 18.0 g/dL   HCT 48.4 40.0 - 52.0 %   MCV 95.5 80.0 - 100.0 fL   MCH 32.8 26.0 - 34.0 pg   MCHC 34.3 32.0 - 36.0 g/dL   RDW 15.2 (H) 11.5 - 14.5 %   Platelets 215 150 - 440 K/uL  Troponin I     Status: Abnormal   Collection Time: 08/18/16 11:18 AM  Result Value Ref Range   Troponin I 7.73 (HH) <0.03 ng/mL  Glucose, capillary     Status: None   Collection Time: 08/18/16 11:22 AM  Result Value Ref Range   Glucose-Capillary 78 65 - 99 mg/dL  Lactic acid, plasma     Status: None   Collection Time: 08/18/16  1:23 PM  Result Value Ref Range   Lactic Acid, Venous 1.1 0.5 - 1.9 mmol/L   No results found.   ASSESSMENT AND PLAN: Non-STEMI with elevated troponin of 7.73 with no ST elevation on EKG and does not meet criteria of STEMI and currently having no chest pain. Patient symptoms basically are syncopal episode at [facility] today after feeling bad for 4 days. He never had chest pain in these 4 days. He took Xarelto 20 mg yesterday around 4:30 PM. He takes Xarelto for left DVT of the lower extremity. We will get pharmacy consultation on starting heparin probably later on today and also get pharmacy consultation with that we can do cardiac catheterization tomorrow as a last case was second to last case. Because we don't want to wait too long because he may have proximal RCA disease and involving right ventricular infarct. Advise giving high doses of fluids maybe 2-300 cc an  hour and avoid nitrates. Patient was  already given aspirin and would be started on heparin after 24 hours of taking Xarelto.  Hoy Fallert A

## 2016-08-18 NOTE — ED Notes (Signed)
Pt attempted to give urine - states only able to give 1/2 cc. Pt will attempt later

## 2016-08-18 NOTE — ED Provider Notes (Signed)
[facility] Emergency Department Provider Note   ____________________________________________   First MD Initiated Contact with Patient 08/18/16 1227     (approximate)  I have reviewed the triage vital signs and the nursing notes.   HISTORY  Chief Complaint Lost consciousness   HPI [name] is a 76 y.o. male previous history of asthma, chronic aspergillosis, multiple other medical issues currently on long-standing antibiotics  Patient was yesterday felt more tired than normal. Has been eating and drinking too well. Then today, he knows this morning that he had a fever to 100.4. He went to urgent care to be evaluated for feeling weak, and while seated on the exam table had a witnessed episode of syncope. He did not strike his head or get injured. He felt nauseated proceeding then very lightheaded, especially with standing and trying to walk.  He denies having a cough, chest pain, headache, numbness tingling or weakness in arm or leg. No abdominal pain, he felt nauseated before he passed out, but denies any nausea now. No vomiting. No diarrhea.  Currently on a long-standing about therapy for aspergillosis no palpitations  Does not think he's ever passed out previous to this    Past Medical History:  Diagnosis Date  . AB (asthmatic bronchitis)   . Allergic rhinitis   . Arthropathy   . Asthma   . Cellulitis   . DVT (deep venous thrombosis) ([place])   . Foreign body in skin   . Glaucoma   . Hx MRSA infection   . Hx MRSA infection   . Hypertension   . Insomnia   . Insomnia   . Mitral valve disorder   . Osteoarthrosis   . Upper back strain     Patient Active Problem List   Diagnosis Date Noted  . NSTEMI (non-ST elevated myocardial infarction) ([place]) 08/18/2016  . Chronic obstructive pulmonary disease ([place]) 01/02/2016  . Recurrent pneumonia 12/21/2014  . Pneumothorax, right 11/04/2014  . Basal cell carcinoma 10/19/2014  . Abnormal liver  enzymes 10/19/2014  . Personal history of methicillin resistant Staphylococcus aureus 10/19/2014  . Cannot sleep 10/19/2014  . Heel pain 10/19/2014  . Pain in shoulder 10/19/2014  . Back strain 10/19/2014  . Pulmonary aspergillosis, allergic bronchopulmonary type ([place]) 07/30/2014  . ABPA (allergic bronchopulmonary aspergillosis) ([place]) 07/30/2014  . CAP (community acquired pneumonia) 05/03/2014  . Abnormal CXR 04/01/2014  . Paroxysmal tachycardia ([place]) 06/23/2013  . Asymptomatic PVCs 06/23/2013  . Beat, premature ventricular 06/23/2013  . Cough 10/19/2012  . Allergic rhinitis 01/17/2009  . Asthma, chronic obstructive, without status asthmaticus ([place]) 11/04/2008  . CAFL (chronic airflow limitation) ([place]) 11/04/2008  . Essential hypertension 02/09/2008  . Arthropathia 08/28/2007  . Disorder of mitral valve 07/08/2005  . Benign prostatic hypertrophy without urinary obstruction 05/16/1997  . LBP (low back pain) 05/16/1997  . Degenerative arthritis of hip 03/18/1995    Past Surgical History:  Procedure Laterality Date  . APPENDECTOMY    . BRONCHOSCOPY    . NASAL SINUS SURGERY  09/27/2014    Prior to Admission medications   Medication Sig Start Date End Date Taking? Authorizing Provider  ADVAIR HFA 230-21 MCG/ACT inhaler Inhale 2 puffs into the lungs 2 (two) times daily.  11/17/15  Yes Historical Provider, MD  azithromycin (ZITHROMAX) 250 MG tablet Take 250 mg by mouth daily.  12/23/15  Yes Historical Provider, MD  brimonidine (ALPHAGAN) 0.15 % ophthalmic solution Place 1 drop into both eyes 2 (two) times daily.  10/26/15  Yes  Historical Provider, MD  Calcium Carb-Cholecalciferol (CALCIUM 600 + D PO) Take 1 tablet by mouth 2 (two) times daily.   Yes Historical Provider, MD  dorzolamide (TRUSOPT) 2 % ophthalmic solution Place 1 drop into both eyes 2 (two) times daily.  08/31/15  Yes Historical Provider, MD  fluticasone (FLONASE) 50 MCG/ACT nasal spray 2 sprays by Each Nare route Two (2)  times a day. 03/27/16 03/27/17 Yes Historical Provider, MD  gabapentin (NEURONTIN) 400 MG capsule Take 400 mg by mouth daily.  09/18/15  Yes Historical Provider, MD  latanoprost (XALATAN) 0.005 % ophthalmic solution Place 1 drop into both eyes at bedtime.    Yes Historical Provider, MD  meloxicam (MOBIC) 15 MG tablet Take 1 tablet (15 mg total) by mouth daily. 06/26/16  Yes Max T Hyatt, DPM  Multiple Vitamin (MULTIVITAMIN) tablet Take 1 tablet by mouth daily.   Yes Historical Provider, MD  tiotropium (SPIRIVA HANDIHALER) 18 MCG inhalation capsule Place 18 mcg into inhaler and inhale daily.  02/06/15 08/18/17 Yes Historical Provider, MD  XARELTO 20 MG TABS tablet Take 20 mg by mouth daily with supper.  09/11/15  Yes Historical Provider, MD    Allergies Timolol maleate [timolol maleate]; Budesonide-formoterol fumarate; and Lisinopril  Family History  Problem Relation Age of Onset  . Breast cancer Sister   . Heart failure Mother   . Pancreatic cancer Sister   . Alzheimer's disease Father   . Brain cancer Daughter     Social History Social History  Substance Use Topics  . Smoking status: Former Smoker    Packs/day: 0.50    Years: 3.00    Types: Cigarettes    Quit date: 06/17/1958  . Smokeless tobacco: Never Used     Comment: smoking while in college  . Alcohol use No     Comment: OCCASSIONAL WINE ONLY    Review of Systems Constitutional:Fever, feels generally weak for the last day  Eyes: No visual changes. ENT: No sore throat.Chronic white spots on his tongue for over 6 months  Cardiovascular: Denies chest pain. Respiratory: Denies shortness of breath. Gastrointestinal: No abdominal pain.  No vomiting.  No diarrhea.  No constipation. Genitourinary: Negative for dysuria. Musculoskeletal: Negative for back pain. Skin: Negative for rash. Neurological: Negative for headaches, focal weakness or numbness.  10-point ROS otherwise  negative.  ____________________________________________   PHYSICAL EXAM:  VITAL SIGNS: ED Triage Vitals  Enc Vitals Group     BP 08/18/16 1118 (!) 100/53     Pulse Rate 08/18/16 1118 88     Resp 08/18/16 1118 18     Temp 08/18/16 1118 97.3 F (36.3 C)     Temp Source 08/18/16 1118 Oral     SpO2 08/18/16 1118 95 %     Weight 08/18/16 1113 175 lb (79.4 kg)     Height 08/18/16 1113 6\' 2"  (1.88 m)     Head Circumference --      Peak Flow --      Pain Score --      Pain Loc --      Pain Edu? --      Excl. in [place]? --     Constitutional: Alert and oriented.Mildly ill-appearing, but in no distress. Alert and oriented.  Eyes: Conjunctivae are normal. PERRL. EOMI. Head: Atraumatic. Nose: No congestion/rhinnorhea. Mouth/Throat: Mucous membranes arenotable dry, he has a whitish gray plaque over his tongue.  Oropharynx non-erythematous. Neck: No stridor.  No meningismus Cardiovascular: Normal rate, regular rhythm. Grossly normal heart sounds.  Good peripheral circulation. Respiratory: Normal respiratory effort.  No retractions. Lungs CTAB. Gastrointestinal: Soft and nontender. No distention. No abdominal bruits. Musculoskeletal: No lower extremity tenderness nor edema.  No joint effusions. Neurologic:  Normal speech and language. No gross focal neurologic deficits are appreciated.  no cranial nerve deficits. Moves all extremities with normal strength and sensation. No dysarthria  Skin:  Skin is warm, dry and intact. No rash noted. Psychiatric: Mood and affect are normal. Speech and behavior are normal.  ____________________________________________   LABS (all labs ordered are listed, but only abnormal results are displayed)  Labs Reviewed  BASIC METABOLIC PANEL - Abnormal; Notable for the following:       Result Value   Sodium 132 (*)    Chloride 95 (*)    Glucose, Bld 102 (*)    Creatinine, Ser 1.61 (*)    GFR calc non Af Amer 40 (*)    GFR calc Af Amer 46 (*)    All other  components within normal limits  CBC - Abnormal; Notable for the following:    WBC 10.7 (*)    RDW 15.2 (*)    All other components within normal limits  TROPONIN I - Abnormal; Notable for the following:    Troponin I 7.73 (*)    All other components within normal limits  GLUCOSE, CAPILLARY  LACTIC ACID, PLASMA  URINALYSIS, COMPLETE (UACMP) WITH MICROSCOPIC  APTT  PROTIME-INR  TROPONIN I  TROPONIN I  TROPONIN I  LIPID PANEL  HEMOGLOBIN A1C  CBG MONITORING, ED   ____________________________________________  EKG  Reviewed and interpreted by me at 11:15 AM Ventricular rate 90 QS 90 QTc 450 Normal sinus rhythm, right axis deviation is noted, the patient appears to have new inferior Q waves noted as compared to his previous EKG in May 2016. There is no evidence of an obvious acute active ischemia noted however. New Q waves. ____________________________________________  RADIOLOGY  No results found.   ____________________________________________   PROCEDURES  Procedure(s) performed: None  Procedures  Critical Care performed: Yes, see critical care note(s)  CRITICAL CARE Performed by: Delman Kitten   Total critical care time: 45 minutes  Critical care time was exclusive of separately billable procedures and treating other patients.  Critical care was necessary to treat or prevent imminent or life-threatening deterioration.  Critical care was time spent personally by me on the following activities: development of treatment plan with patient and/or surrogate as well as nursing, discussions with consultants, evaluation of patient's response to treatment, examination of patient, obtaining history from patient or surrogate, ordering and performing treatments and interventions, ordering and review of laboratory studies, ordering and review of radiographic studies, pulse oximetry and re-evaluation of patient's  condition.  ____________________________________________   INITIAL IMPRESSION / ASSESSMENT AND PLAN / ED COURSE  Pertinent labs & imaging results that were available during my care of the patient were reviewed by me and considered in my medical decision making (see chart for details).  Patient's for fatigue, generalized weakness, syncopal episode. Clinical examination and history appear to suggest dehydration, elevated creatinine, dry mucous membranes, and reports decreased water intake. No focal findings. Denies any acute cardiac pulmonary or obvious infectious symptoms other than associated fever which she had measured only once this morning, but reports he had no fever at the urgent care or here.  Differential diagnoses for today sig one episode is quite broad, performing a detailed workup. Discussed with the patient and anticipate admission to hospital. Most initiate IV fluids as  he appears volume depleted    ----------------------------------------- 1:47 PM on 08/18/2016 -----------------------------------------  Patient reports he feels improved. He denies having any chest pain or pressure, no trouble breathing. He is on Xarelto into his last dose last evening. I discussed this case with Dr. Chancy Milroy, he reviewed EKGs with me, recommends that the patient be transitioned to heparin once available, I have sent coagulations to help and decision timing on this, aspirin, and admission to the hospital. He advises that this appears most consistent with a Q waved out MI, and would not benefit from immediate revascularization therapy. Discussed with the patient, he is agreeable to plan for admission, cardiology consultation by Dr. Chancy Milroy today, and further workup. Blood pressures improved.  Cardiology, Dr. Chancy Milroy coming to see the patient in consult in the next hour or so. Admitting the hospitalist service. She is improved. Patient a symptomatic at this  time.  ____________________________________________   FINAL CLINICAL IMPRESSION(S) / ED DIAGNOSES  Final diagnoses:  NSTEMI, initial episode of care [facility])      NEW MEDICATIONS STARTED DURING THIS VISIT:  New Prescriptions   No medications on file     Note:  This document was prepared using Dragon voice recognition software and may include unintentional dictation errors.     Delman Kitten, MD 08/18/16 [phone]

## 2016-08-18 NOTE — Progress Notes (Signed)
Active 76 y/o enters with 4 days malaise, syncope and pos troponins. He denies any chest pain. Oriented to room. Heparin gtt started. Dinner tray ordered. Pt reading cardiac cath book.

## 2016-08-19 ENCOUNTER — Encounter
Admission: EM | Disposition: A | Discharge status: Home / Self Care | Payer: Self-pay | Source: Home / Self Care | Attending: Internal Medicine

## 2016-08-19 HISTORY — PX: LEFT HEART CATH AND CORONARY ANGIOGRAPHY: CATH118249

## 2016-08-19 LAB — CBC
HEMATOCRIT: 39.9 % — AB (ref 40.0–52.0)
HEMOGLOBIN: 13.7 g/dL (ref 13.0–18.0)
MCH: 32 pg (ref 26.0–34.0)
MCHC: 34.2 g/dL (ref 32.0–36.0)
MCV: 93.5 fL (ref 80.0–100.0)
Platelets: 187 10*3/uL (ref 150–440)
RBC: 4.27 MIL/uL — ABNORMAL LOW (ref 4.40–5.90)
RDW: 14.7 % — ABNORMAL HIGH (ref 11.5–14.5)
WBC: 8.5 10*3/uL (ref 3.8–10.6)

## 2016-08-19 LAB — ECHOCARDIOGRAM COMPLETE
Height: 74 in
WEIGHTICAEL: 2800 [oz_av]

## 2016-08-19 LAB — BASIC METABOLIC PANEL
ANION GAP: 3 — AB (ref 5–15)
BUN: 19 mg/dL (ref 6–20)
CO2: 24 mmol/L (ref 22–32)
Calcium: 7.8 mg/dL — ABNORMAL LOW (ref 8.9–10.3)
Chloride: 106 mmol/L (ref 101–111)
Creatinine, Ser: 1.26 mg/dL — ABNORMAL HIGH (ref 0.61–1.24)
GFR calc Af Amer: >60 mL/min (ref >60)
GFR calc non Af Amer: 53 mL/min — ABNORMAL LOW (ref >60)
GLUCOSE: 97 mg/dL (ref 65–99)
POTASSIUM: 4 mmol/L (ref 3.5–5.1)
Sodium: 133 mmol/L — ABNORMAL LOW (ref 135–145)

## 2016-08-19 LAB — HEPARIN LEVEL (UNFRACTIONATED): Heparin Unfractionated: 0.49 IU/mL (ref 0.30–0.70)

## 2016-08-19 LAB — TROPONIN I: TROPONIN I: 8.75 ng/mL — AB (ref <0.03)

## 2016-08-19 LAB — HEMOGLOBIN A1C
HEMOGLOBIN A1C: 5.4 % (ref 4.8–5.6)
MEAN PLASMA GLUCOSE: 108 mg/dL

## 2016-08-19 LAB — APTT
APTT: 50 s — AB (ref 24–36)
APTT: 34 s (ref 24–36)

## 2016-08-19 SURGERY — LEFT HEART CATH AND CORONARY ANGIOGRAPHY
Anesthesia: Moderate Sedation

## 2016-08-19 MED ORDER — HEPARIN (PORCINE) IN NACL 100-0.45 UNIT/ML-% IJ SOLN: 800.0000 [IU]/h | INTRAMUSCULAR | Status: DC

## 2016-08-19 MED ORDER — IOPAMIDOL (ISOVUE-300) INJECTION 61%
INTRAVENOUS | Status: DC | PRN
  Administered 2016-08-19: 130 mL via INTRA_ARTERIAL

## 2016-08-19 MED ORDER — FENTANYL CITRATE (PF) 100 MCG/2ML IJ SOLN
INTRAMUSCULAR | Status: DC | PRN
  Administered 2016-08-19: 25 ug via INTRAVENOUS

## 2016-08-19 MED ORDER — HEPARIN (PORCINE) IN NACL 2-0.9 UNIT/ML-% IJ SOLN
INTRAMUSCULAR | Status: AC
  Filled 2016-08-19: qty 1000

## 2016-08-19 MED ORDER — CLOPIDOGREL BISULFATE 75 MG PO TABS
75.0000 mg | ORAL_TABLET | Freq: Every day | ORAL | Status: DC
  Administered 2016-08-19 – 2016-08-20 (×2): 75 mg via ORAL
  Filled 2016-08-19 (×3): qty 1

## 2016-08-19 MED ORDER — SODIUM CHLORIDE 0.9% FLUSH: 3.0000 mL | INTRAVENOUS | Status: DC | PRN

## 2016-08-19 MED ORDER — MIDAZOLAM HCL 2 MG/2ML IJ SOLN
INTRAMUSCULAR | Status: DC | PRN
  Administered 2016-08-19: 1 mg via INTRAVENOUS

## 2016-08-19 MED ORDER — SODIUM CHLORIDE 0.9 % WEIGHT BASED INFUSION
1.0000 mL/kg/h | INTRAVENOUS | Status: AC
  Administered 2016-08-19: 1 mL/kg/h via INTRAVENOUS

## 2016-08-19 MED ORDER — SODIUM CHLORIDE 0.9% FLUSH
3.0000 mL | Freq: Two times a day (BID) | INTRAVENOUS | Status: DC
  Administered 2016-08-20: 3 mL via INTRAVENOUS

## 2016-08-19 MED ORDER — ACETAMINOPHEN 325 MG PO TABS: 650.0000 mg | ORAL_TABLET | ORAL | Status: DC | PRN

## 2016-08-19 MED ORDER — ISOSORBIDE MONONITRATE ER 30 MG PO TB24
30.0000 mg | ORAL_TABLET | Freq: Every day | ORAL | Status: DC
  Administered 2016-08-19 – 2016-08-20 (×2): 30 mg via ORAL
  Filled 2016-08-19 (×2): qty 1

## 2016-08-19 MED ORDER — ASPIRIN EC 81 MG PO TBEC
81.0000 mg | DELAYED_RELEASE_TABLET | Freq: Every day | ORAL | Status: DC
  Administered 2016-08-20: 81 mg via ORAL
  Filled 2016-08-19: qty 1

## 2016-08-19 MED ORDER — HEPARIN (PORCINE) IN NACL 100-0.45 UNIT/ML-% IJ SOLN: 1200.0000 [IU]/h | INTRAMUSCULAR | Status: DC

## 2016-08-19 MED ORDER — MIDAZOLAM HCL 2 MG/2ML IJ SOLN
INTRAMUSCULAR | Status: AC
  Filled 2016-08-19: qty 2

## 2016-08-19 MED ORDER — ONDANSETRON HCL 4 MG/2ML IJ SOLN: 4.0000 mg | Freq: Four times a day (QID) | INTRAMUSCULAR | Status: DC | PRN

## 2016-08-19 MED ORDER — FENTANYL CITRATE (PF) 100 MCG/2ML IJ SOLN
INTRAMUSCULAR | Status: AC
  Filled 2016-08-19: qty 2

## 2016-08-19 MED ORDER — SODIUM CHLORIDE 0.9 % IV SOLN: 250.0000 mL | INTRAVENOUS | Status: DC | PRN

## 2016-08-19 MED ORDER — HEPARIN (PORCINE) IN NACL 100-0.45 UNIT/ML-% IJ SOLN
1100.0000 [IU]/h | INTRAMUSCULAR | Status: DC
  Administered 2016-08-19: 1100 [IU]/h via INTRAVENOUS

## 2016-08-19 MED ORDER — GABAPENTIN 400 MG PO CAPS
400.0000 mg | ORAL_CAPSULE | Freq: Every day | ORAL | Status: DC
  Administered 2016-08-19: 400 mg via ORAL
  Filled 2016-08-19: qty 1

## 2016-08-19 SURGICAL SUPPLY — 10 items
CATH INFINITI 5FR ANG PIGTAIL (CATHETERS) ×2 IMPLANT
CATH INFINITI 5FR JL4 (CATHETERS) ×2 IMPLANT
CATH INFINITI JR4 5F (CATHETERS) ×2 IMPLANT
DEVICE CLOSURE MYNXGRIP 5F (Vascular Products) ×2 IMPLANT
KIT MANI 3VAL PERCEP (MISCELLANEOUS) ×3 IMPLANT
NDL PERC 18GX7CM (NEEDLE) IMPLANT
NEEDLE PERC 18GX7CM (NEEDLE) ×3 IMPLANT
PACK CARDIAC CATH (CUSTOM PROCEDURE TRAY) ×3 IMPLANT
SHEATH PINNACLE 5F 10CM (SHEATH) ×2 IMPLANT
WIRE EMERALD 3MM-J .035X150CM (WIRE) ×2 IMPLANT

## 2016-08-19 NOTE — Progress Notes (Signed)
Heparin drip stopped per pharmacy for one hour due to heparin level of 1.07

## 2016-08-19 NOTE — Progress Notes (Signed)
Pt clinically stable post heart cath per Dr Humphrey Rolls. Medical management.right groin without bleeding nor hematoma. Sinus rhythm per monitor. Pt awake,alert and oriented. Vitals stable. Report called to [place] on telemetry ([id number]) with questions answered. Plan reviewed. Pt to stay overnight in telemetry with medicine adjustment per Dr Humphrey Rolls, home tomorrow.

## 2016-08-19 NOTE — Progress Notes (Signed)
Patient ID: [name], male   DOB: Apr 21, 1941, 76 y.o.   MRN: [id number]  [facility] PROGRESS NOTE  [name] [id number] DOB: 08/04/1940 DOA: 08/18/2016 PCP: Clinton Durie, MD  HPI/Subjective: Patient was surprised when he was told that he had a heart attack. He felt weak dizzy and unsteady on his feet. He also had total exhaustion of his body starting on Friday. In the ER he had an elevated troponin. Currently he feels fine.  Objective: Vitals:   08/19/16 0511 08/19/16 0540  BP: 117/71   Pulse: 99   Resp: 18   Temp: (!) 100.6 F (38.1 C) 98.6 F (37 C)    Filed Weights   08/18/16 1113  Weight: 79.4 kg (175 lb)    ROS: Review of Systems  Constitutional: Negative for chills and fever.  Eyes: Negative for blurred vision.  Respiratory: Negative for cough and shortness of breath.   Cardiovascular: Negative for chest pain.  Gastrointestinal: Negative for abdominal pain, constipation, diarrhea, nausea and vomiting.  Genitourinary: Negative for dysuria.  Musculoskeletal: Negative for joint pain.  Neurological: Negative for dizziness and headaches.   Exam: Physical Exam  Constitutional: He is oriented to person, place, and time.  HENT:  Nose: No mucosal edema.  Mouth/Throat: No oropharyngeal exudate or posterior oropharyngeal edema.  Eyes: Conjunctivae, EOM and lids are normal. Pupils are equal, round, and reactive to light.  Neck: No JVD present. Carotid bruit is not present. No edema present. No thyroid mass and no thyromegaly present.  Cardiovascular: S1 normal and S2 normal.  Exam reveals no gallop.   No murmur heard. Pulses:      Dorsalis pedis pulses are 2+ on the right side, and 2+ on the left side.  Respiratory: No respiratory distress. He has no wheezes. He has no rhonchi. He has no rales.  GI: Soft. Bowel sounds are normal. There is no tenderness.  Musculoskeletal:       Right ankle: He exhibits no swelling.       Left ankle: He exhibits no  swelling.  Lymphadenopathy:    He has no cervical adenopathy.  Neurological: He is oriented to person, place, and time. No cranial nerve deficit.  Skin: Skin is warm. No rash noted. Nails show no clubbing.  Psychiatric: He has a normal mood and affect.      Data Reviewed: Basic Metabolic Panel:  Recent Labs Lab 08/18/16 1118 08/18/16 1726 08/19/16 0125  NA 132* 130* 133*  K 3.8 3.9 4.0  CL 95* 99* 106  CO2 30 24 24   GLUCOSE 102* 98 97  BUN 17 17 19   CREATININE 1.61* 1.29* 1.26*  CALCIUM 8.9 8.4* 7.8*   CBC:  Recent Labs Lab 08/18/16 1118 08/18/16 1726 08/19/16 0125  WBC 10.7* 7.6 8.5  HGB 16.6 15.2 13.7  HCT 48.4 44.7 39.9*  MCV 95.5 96.4 93.5  PLT 215 180 187   Cardiac Enzymes:  Recent Labs Lab 08/18/16 1118 08/18/16 1726 08/18/16 2117 08/19/16 0125  TROPONINI 7.73* 6.83* 7.99* 8.75*    CBG:  Recent Labs Lab 08/18/16 1122  GLUCAP 78     Studies: Dg Chest Portable 1 View  Result Date: 08/18/2016 CLINICAL DATA:  Hypotension with elevated troponin. EXAM: PORTABLE CHEST 1 VIEW COMPARISON:  February 14, 2015 FINDINGS: Costochondral calcification. No suspicious nodules or masses. No infiltrate. No pneumothorax. No other change. IMPRESSION: No active disease. Electronically Signed   By: [name] III M.D   On: 08/18/2016 15:30  Scheduled Meds: . albuterol  2.5 mg Nebulization BID  . aspirin  81 mg Oral Pre-Cath  . azithromycin  250 mg Oral Daily  . brimonidine  1 drop Both Eyes BID  . dorzolamide  1 drop Both Eyes BID  . fluticasone  1 spray Each Nare Daily  . gabapentin  400 mg Oral TID  . latanoprost  1 drop Both Eyes QHS  . meloxicam  15 mg Oral Daily  . mometasone-formoterol  2 puff Inhalation BID  . multivitamin with minerals  1 tablet Oral Daily  . sodium chloride flush  3 mL Intravenous Q12H  . sodium chloride flush  3 mL Intravenous Q12H  . tiotropium  18 mcg Inhalation Daily   Continuous Infusions: . sodium chloride 200 mL/hr  at 08/19/16 0259  . sodium chloride    . heparin 1,100 Units/hr (08/19/16 0038)    Assessment/Plan:  1. NSTEMI- patient to have cardiac catheterization this afternoon. Currently on heparin drip. Continue aspirin. Patient had an allergy to atenolol eyedrops which causes shortness of breath. Hesitant on beta blocker. Add Lipitor 40 mg daily. 2. History of DVT in the past. Patient was on Xarelto prior to coming I 3. History of COPD. Continue inhalers 4. Dehydration with acute kidney injury. IV fluids improved her creatinine. 5. Glaucoma on eyedrops  Code Status:     Code Status Orders        Start     Ordered   08/18/16 1635  Full code  Continuous     08/18/16 1634    Code Status History    Date Active Date Inactive Code Status Order ID Comments User Context   11/04/2014  7:09 AM 11/09/2014  2:50 PM Full Code [id number]  Molly Maduro, MD ED    Advance Directive Documentation   Flowsheet Row Most Recent Value  Type of Advance Directive  Healthcare Power of Attorney, Living will  Pre-existing out of facility DNR order (yellow form or pink MOST form)  No data  "MOST" Form in Place?  No data      Disposition Plan: depending on  Results of cardiac catheterization  Consultants:  Dr. Humphrey Rolls  Time spent: 25 minutes  [place], [facility]

## 2016-08-19 NOTE — Progress Notes (Signed)
ANTICOAGULATION CONSULT NOTE - Initial Consult  Pharmacy Consult for heparin Indication: chest pain/ACS       Allergies  Allergen Reactions  . Timolol Maleate [Timolol Maleate] Shortness Of Breath  . Budesonide-Formoterol Fumarate Other (See Comments)    Patient reports dizziness and trembling/shaky feeling.  . Lisinopril     cough   Patient Measurements: Height: 6\' 2"  (188 cm) Weight: 175 lb (79.4 kg) IBW/kg (Calculated) : 82.2 Heparin Dosing Weight: 79 kg  Vital Signs: Temp: 97.3 F (36.3 C) (03/04 1118) Temp Source: Oral (03/04 1118) BP: 99/52 (03/04 1400) Pulse Rate: 88 (03/04 1118)  Labs:  Recent Labs (last 2 labs)    Recent Labs  08/18/16 1118  HGB 16.6  HCT 48.4  PLT 215  CREATININE 1.61*  TROPONINI 7.73*      Estimated Creatinine Clearance: 43.2 mL/min (by C-G formula based on SCr of 1.61 mg/dL (H)).   Assessment: 76yo M who presents with loss of consciousness. Patient admitted with NSTEMI.  Patient was taking rivaroxaban PTA for a history of DVT. Last dose at 08/17/16 at 1630. Patient would be able to have cardiac catheterization 48hrs after last Xarelto dose.   Goal of Therapy:  Heparin level 0.3-0.7 units/ml aPTT 66- 102 seconds Monitor platelets by anticoagulation protocol: Yes   Plan:  APTT = 50 (subtherapeutic) on infusion rate of 1100 units/hr. Spoke with RN who confirmed no issues/interruptions in infusion.  Will increase heparin infusion to 1200 units/hr and order APTT in 6 hours.  APTT and HL did not correlate this morning so will dose using APTT and recheck HL tomorrow morning. Patient will possible have cardiac cath this afternoon per notes. CBC to be checked daily.  Lenis Noon, PharmD, BCPS Clinical Pharmacist 08/19/2016

## 2016-08-19 NOTE — Progress Notes (Signed)
SUBJECTIVE: Doing well, no cp   Vitals:   08/19/16 0540 08/19/16 0757 08/19/16 0800 08/19/16 1121  BP:   119/67 118/73  Pulse:   93 88  Resp:   16 18  Temp: 98.6 F (37 C)  97.4 F (36.3 C) 97.6 F (36.4 C)  TempSrc: Oral  Oral   SpO2:  99% 96% 100%  Weight:      Height:        Intake/Output Summary (Last 24 hours) at 08/19/16 1133 Last data filed at 08/19/16 1007  Gross per 24 hour  Intake             1780 ml  Output             1000 ml  Net              780 ml    LABS: Basic Metabolic Panel:  Recent Labs  08/18/16 1726 08/19/16 0125  NA 130* 133*  K 3.9 4.0  CL 99* 106  CO2 24 24  GLUCOSE 98 97  BUN 17 19  CREATININE 1.29* 1.26*  CALCIUM 8.4* 7.8*   Liver Function Tests: No results for input(s): AST, ALT, ALKPHOS, BILITOT, PROT, ALBUMIN in the last 72 hours. No results for input(s): LIPASE, AMYLASE in the last 72 hours. CBC:  Recent Labs  08/18/16 1726 08/19/16 0125  WBC 7.6 8.5  HGB 15.2 13.7  HCT 44.7 39.9*  MCV 96.4 93.5  PLT 180 187   Cardiac Enzymes:  Recent Labs  08/18/16 1726 08/18/16 2117 08/19/16 0125  TROPONINI 6.83* 7.99* 8.75*   BNP: Invalid input(s): POCBNP D-Dimer: No results for input(s): DDIMER in the last 72 hours. Hemoglobin A1C:  Recent Labs  08/18/16 1432  HGBA1C 5.4   Fasting Lipid Panel:  Recent Labs  08/18/16 1432  CHOL 141  HDL 55  LDLCALC 75  TRIG 57  CHOLHDL 2.6   Thyroid Function Tests: No results for input(s): TSH, T4TOTAL, T3FREE, THYROIDAB in the last 72 hours.  Invalid input(s): FREET3 Anemia Panel: No results for input(s): VITAMINB12, FOLATE, FERRITIN, TIBC, IRON, RETICCTPCT in the last 72 hours.   PHYSICAL EXAM General: Well developed, well nourished, in no acute distress HEENT:  Normocephalic and atramatic Neck:  No JVD.  Lungs: Clear bilaterally to auscultation and percussion. Heart: HRRR . Normal S1 and S2 without gallops or murmurs.  Abdomen: Bowel sounds are positive, abdomen  soft and non-tender  Msk:  Back normal, normal gait. Normal strength and tone for age. Extremities: No clubbing, cyanosis or edema.   Neuro: Alert and oriented X 3. Psych:  Good affect, responds appropriately  TELEMETRY: nsr  ASSESSMENT AND PLAN:NSTEMI, Will be doing cath today.  Active Problems:   NSTEMI (non-ST elevated myocardial infarction) ([place])    Clinton Laming A, MD, [facility] 08/19/2016 11:33 AM

## 2016-08-19 NOTE — Progress Notes (Signed)
ANTICOAGULATION CONSULT NOTE - Initial Consult  Pharmacy Consult for heparin Indication: chest pain/ACS       Allergies  Allergen Reactions  . Timolol Maleate [Timolol Maleate] Shortness Of Breath  . Budesonide-Formoterol Fumarate Other (See Comments)    Patient reports dizziness and trembling/shaky feeling.  . Lisinopril     cough    Patient Measurements: Height: 6\' 2"  (188 cm) Weight: 175 lb (79.4 kg) IBW/kg (Calculated) : 82.2 Heparin Dosing Weight: 79.4kg  Vital Signs: Temp: 97.3 F (36.3 C) (03/04 1118) Temp Source: Oral (03/04 1118) BP: 99/52 (03/04 1400) Pulse Rate: 88 (03/04 1118)  Labs:  Recent Labs (last 2 labs)    Recent Labs  08/18/16 1118  HGB 16.6  HCT 48.4  PLT 215  CREATININE 1.61*  TROPONINI 7.73*      Estimated Creatinine Clearance: 43.2 mL/min (by C-G formula based on SCr of 1.61 mg/dL (H)).   Medical History:     Past Medical History:  Diagnosis Date  . AB (asthmatic bronchitis)   . Allergic rhinitis   . Arthropathy   . Asthma   . Cellulitis   . DVT (deep venous thrombosis) ([place])   . Foreign body in skin   . Glaucoma   . Hx MRSA infection   . Hx MRSA infection   . Hypertension   . Insomnia   . Insomnia   . Mitral valve disorder   . Osteoarthrosis   . Upper back strain    Assessment: 76yo M who presents with loss of consciousness. Patient has an MI.  Patient is on Xarelto at home. Last dose at 08/17/16 at 1630. Patient would be able to have cardiac catheterization 48hrs after last Xarelto dose.   Baseline labs checked.   Goal of Therapy:  Heparin level 0.3-0.7 units/ml aPTT 68- 109 seconds Monitor platelets by anticoagulation protocol: Yes   Plan:  Start heparin infusion at 950 units/hr Check anti-Xa level in 6 hours and daily while on heparin Continue to monitor H&H and platelets APTT and HL in 6hrs  3/5 aPTT 49 (goal 68 - 109) will increase rate to 1100 unit/hr (2 units/hr);  Anti-Xa 1.07 -- levels not currently correlating -- will dose by HL once aPTT correlates w/ HL -- will check aPTT/HL in 6 hours.  Thank you for this consult.  Tobie Lords, PharmD, BCPS Clinical Pharmacist 08/19/2016

## 2016-08-19 NOTE — Care Management (Signed)
Admitted to [facility] with the diagnosis of MI+. Lives with wife, Everlene Farrier, [phone]). Last seen Dr. Rosanna Randy in July 2017. No home health. No skilled facility. No home oxygen. No equipment in the home. Takes care of all basic activities of daily living himself, drives. Prescriptions are filled at [facility]. No falls. Good appetite. Wife will transport Scheduled for Cardiac cath.  Today. Shelbie Ammons RN MSN CCM Care Management

## 2016-08-20 ENCOUNTER — Encounter: Payer: Self-pay | Admitting: Cardiovascular Disease

## 2016-08-20 ENCOUNTER — Inpatient Hospital Stay: Payer: PPO

## 2016-08-20 DIAGNOSIS — I82409 Acute embolism and thrombosis of unspecified deep veins of unspecified lower extremity: Secondary | ICD-10-CM

## 2016-08-20 HISTORY — DX: Acute embolism and thrombosis of unspecified deep veins of unspecified lower extremity: I82.409

## 2016-08-20 LAB — BASIC METABOLIC PANEL
Anion gap: 4 — ABNORMAL LOW (ref 5–15)
BUN: 13 mg/dL (ref 6–20)
CALCIUM: 7.5 mg/dL — AB (ref 8.9–10.3)
CO2: 22 mmol/L (ref 22–32)
Chloride: 110 mmol/L (ref 101–111)
Creatinine, Ser: 0.87 mg/dL (ref 0.61–1.24)
GFR calc Af Amer: >60 mL/min (ref >60)
GLUCOSE: 102 mg/dL — AB (ref 65–99)
POTASSIUM: 4.2 mmol/L (ref 3.5–5.1)
SODIUM: 136 mmol/L (ref 135–145)

## 2016-08-20 LAB — CBC
HCT: 35.2 % — ABNORMAL LOW (ref 40.0–52.0)
Hemoglobin: 12.2 g/dL — ABNORMAL LOW (ref 13.0–18.0)
MCH: 33.1 pg (ref 26.0–34.0)
MCHC: 34.8 g/dL (ref 32.0–36.0)
MCV: 95.2 fL (ref 80.0–100.0)
PLATELETS: 166 10*3/uL (ref 150–440)
RBC: 3.7 MIL/uL — ABNORMAL LOW (ref 4.40–5.90)
RDW: 14.9 % — AB (ref 11.5–14.5)
WBC: 7.1 10*3/uL (ref 3.8–10.6)

## 2016-08-20 MED ORDER — ATORVASTATIN CALCIUM 20 MG PO TABS: 40.0000 mg | ORAL_TABLET | Freq: Every day | ORAL | Status: DC

## 2016-08-20 MED ORDER — DILTIAZEM HCL ER COATED BEADS 180 MG PO CP24: 180.0000 mg | ORAL_CAPSULE | Freq: Every day | ORAL | 0 refills | Status: DC

## 2016-08-20 MED ORDER — FLUTICASONE PROPIONATE 50 MCG/ACT NA SUSP
1.0000 | Freq: Every day | NASAL | Status: DC
  Filled 2016-08-20: qty 16

## 2016-08-20 MED ORDER — DILTIAZEM HCL 25 MG/5ML IV SOLN
10.0000 mg | INTRAVENOUS | Status: DC | PRN
  Administered 2016-08-20: 10 mg via INTRAVENOUS
  Filled 2016-08-20: qty 5

## 2016-08-20 MED ORDER — GUAIFENESIN-DM 100-10 MG/5ML PO SYRP: 5.0000 mL | ORAL_SOLUTION | ORAL | Status: DC | PRN

## 2016-08-20 MED ORDER — APIXABAN 5 MG PO TABS: 5.0000 mg | ORAL_TABLET | Freq: Two times a day (BID) | ORAL | Status: DC

## 2016-08-20 MED ORDER — DILTIAZEM HCL ER COATED BEADS 180 MG PO CP24
180.0000 mg | ORAL_CAPSULE | Freq: Every day | ORAL | Status: DC
  Administered 2016-08-20: 180 mg via ORAL
  Filled 2016-08-20: qty 1

## 2016-08-20 MED ORDER — ASPIRIN 81 MG PO TBEC: 81.0000 mg | DELAYED_RELEASE_TABLET | Freq: Every day | ORAL | 0 refills | Status: DC

## 2016-08-20 MED ORDER — APIXABAN 5 MG PO TABS
10.0000 mg | ORAL_TABLET | Freq: Two times a day (BID) | ORAL | Status: DC
  Administered 2016-08-20: 10 mg via ORAL
  Filled 2016-08-20: qty 2

## 2016-08-20 MED ORDER — ATORVASTATIN CALCIUM 40 MG PO TABS: 40.0000 mg | ORAL_TABLET | Freq: Every day | ORAL | 0 refills | Status: DC

## 2016-08-20 MED ORDER — APIXABAN 5 MG PO TABS: ORAL_TABLET | ORAL | 0 refills | Status: DC

## 2016-08-20 MED ORDER — ISOSORBIDE MONONITRATE ER 30 MG PO TB24: 30.0000 mg | ORAL_TABLET | Freq: Every day | ORAL | 0 refills | Status: DC

## 2016-08-20 NOTE — Care Management Important Message (Signed)
Important Message  Patient Details  Name: [name] MRN: [id number] Date of Birth: 03/15/1941   Medicare Important Message Given:  Yes    Shelbie Ammons, RN 08/20/2016, 9:38 AM

## 2016-08-20 NOTE — Progress Notes (Signed)
ANTICOAGULATION CONSULT NOTE - Initial Consult  Pharmacy Consult for apixaban Indication: DVT  Allergies  Allergen Reactions  . Timolol Maleate [Timolol Maleate] Shortness Of Breath  . Budesonide-Formoterol Fumarate Other (See Comments)    Patient reports dizziness and trembling/shaky feeling.  . Lisinopril     cough    Patient Measurements: Height: 6\' 2"  (188 cm) Weight: 175 lb (79.4 kg) IBW/kg (Calculated) : 82.2  Vital Signs: Temp: 98 F (36.7 C) (03/06 1118) Temp Source: Oral (03/06 1118) BP: 108/60 (03/06 1118) Pulse Rate: 82 (03/06 1118)  Labs:  Recent Labs  08/18/16 1447 08/18/16 1726 08/18/16 2117 08/18/16 2304 08/19/16 0125 08/19/16 0719 08/19/16 1744 08/20/16 0345  HGB  --  15.2  --   --  13.7  --   --  12.2*  HCT  --  44.7  --   --  39.9*  --   --  35.2*  PLT  --  180  --   --  187  --   --  166  APTT 44*  --   --  49*  --  50* 34  --   LABPROT 18.4* 18.6*  --   --   --   --   --   --   INR 1.51 1.54  --   --   --   --   --   --   HEPARINUNFRC 1.65*  --   --  1.07*  --  0.49  --   --   CREATININE  --  1.29*  --   --  1.26*  --   --  0.87  TROPONINI  --  6.83* 7.99*  --  8.75*  --   --   --     Estimated Creatinine Clearance: 79.9 mL/min (by C-G formula based on SCr of 0.87 mg/dL).   Medical History: Past Medical History:  Diagnosis Date  . AB (asthmatic bronchitis)   . Allergic rhinitis   . Arthropathy   . Asthma   . Cellulitis   . DVT (deep venous thrombosis) ([place])   . Foreign body in skin   . Glaucoma   . Hx MRSA infection   . Hx MRSA infection   . Hypertension   . Insomnia   . Insomnia   . Mitral valve disorder   . Osteoarthrosis   . Upper back strain    Assessment: Pharmacy consulted to dose and monitor apixaban in this 76 year old male diagnosed with a DVT today. Patient has a history of VTE and was taking rivaroxaban prior to admission.   Plan:  Apixaban 10 mg PO BID x 7 days followed by apixaban 5 mg PO BID. CBC/SCr to be  monitored at least every 72 hours per anticoagulation protocol.   Lenis Noon, PharmD, BCPS Clinical Pharmacist 08/20/2016,12:58 PM

## 2016-08-20 NOTE — Care Management (Signed)
Discharge to home today per Dr. Leslye Peer. Eliquis 30 day card given as requested. Family will transport. Shelbie Ammons RN MSN CCM Care Management

## 2016-08-20 NOTE — Progress Notes (Addendum)
SUBJECTIVE: Patient feels much better   Vitals:   08/20/16 0215 08/20/16 0424 08/20/16 0740 08/20/16 0955  BP: 106/71 108/64    Pulse: (!) 143 99    Resp:  18    Temp:  100 F (37.8 C)    TempSrc:      SpO2:  95% 96% 95%  Weight:      Height:        Intake/Output Summary (Last 24 hours) at 08/20/16 0959 Last data filed at 08/20/16 0819  Gross per 24 hour  Intake           1694.9 ml  Output              900 ml  Net            794.9 ml    LABS: Basic Metabolic Panel:  Recent Labs  08/19/16 0125 08/20/16 0345  NA 133* 136  K 4.0 4.2  CL 106 110  CO2 24 22  GLUCOSE 97 102*  BUN 19 13  CREATININE 1.26* 0.87  CALCIUM 7.8* 7.5*   Liver Function Tests: No results for input(s): AST, ALT, ALKPHOS, BILITOT, PROT, ALBUMIN in the last 72 hours. No results for input(s): LIPASE, AMYLASE in the last 72 hours. CBC:  Recent Labs  08/19/16 0125 08/20/16 0345  WBC 8.5 7.1  HGB 13.7 12.2*  HCT 39.9* 35.2*  MCV 93.5 95.2  PLT 187 166   Cardiac Enzymes:  Recent Labs  08/18/16 1726 08/18/16 2117 08/19/16 0125  TROPONINI 6.83* 7.99* 8.75*   BNP: Invalid input(s): POCBNP D-Dimer: No results for input(s): DDIMER in the last 72 hours. Hemoglobin A1C:  Recent Labs  08/18/16 1432  HGBA1C 5.4   Fasting Lipid Panel:  Recent Labs  08/18/16 1432  CHOL 141  HDL 55  LDLCALC 75  TRIG 57  CHOLHDL 2.6   Thyroid Function Tests: No results for input(s): TSH, T4TOTAL, T3FREE, THYROIDAB in the last 72 hours.  Invalid input(s): FREET3 Anemia Panel: No results for input(s): VITAMINB12, FOLATE, FERRITIN, TIBC, IRON, RETICCTPCT in the last 72 hours.   PHYSICAL EXAM General: Well developed, well nourished, in no acute distress HEENT:  Normocephalic and atramatic Neck:  No JVD.  Lungs: Clear bilaterally to auscultation and percussion. Heart: HRRR . Normal S1 and S2 without gallops or murmurs.  Abdomen: Bowel sounds are positive, abdomen soft and non-tender  Msk:   Back normal, normal gait. Normal strength and tone for age. Extremities: No clubbing, cyanosis or edema.   Neuro: Alert and oriented X 3. Psych:  Good affect, responds appropriately  TELEMETRY: NSR ASSESSMENT AND PLAN: 50 % mid RCA. May go hhome with f/u Friday 10 am. Active Problems:   NSTEMI (non-ST elevated myocardial infarction) ([place])    Clinton Laming A, MD, [facility] 08/20/2016 9:59 AM

## 2016-08-20 NOTE — Progress Notes (Addendum)
Patient heart rate sustaining 140's, patient sleeping at time. Per pt " I feel fine". VSS. Groin site still level 0. MD Paged, awaiting response.   Update 0255: Per MD Pyreddy, give 5 mg Metoprolol IV push every 4 hours as needed for heart rate greater than 110. Will placed order, and administer as ordered.   Update: Patient has allergy to beta blocker. MD Pyreddy made aware. Per MD, give Cardizem 10 mg every 4 hours hours as needed for heart rate greater than 110. Order placed by this RN, Will administer and continue to monitor.

## 2016-08-20 NOTE — Discharge Summary (Signed)
[facility] [place] NAME: Alarick Guffin    MR#:  [id number]  DATE OF BIRTH:  1940-07-27  DATE OF ADMISSION:  08/18/2016 ADMITTING PHYSICIAN: Vaughan Basta, MD  DATE OF DISCHARGE: 08/20/2016  1:11 PM  PRIMARY CARE PHYSICIAN: Wilhemena Durie, MD    ADMISSION DIAGNOSIS:  NSTEMI, initial episode of care [facility]) [I21.4]  DISCHARGE DIAGNOSIS:  Active Problems:   NSTEMI (non-ST elevated myocardial infarction) ([place])   SECONDARY DIAGNOSIS:   Past Medical History:  Diagnosis Date  . AB (asthmatic bronchitis)   . Allergic rhinitis   . Arthropathy   . Asthma   . Cellulitis   . DVT (deep venous thrombosis) ([place])   . Foreign body in skin   . Glaucoma   . Hx MRSA infection   . Hx MRSA infection   . Hypertension   . Insomnia   . Insomnia   . Mitral valve disorder   . Osteoarthrosis   . Upper back strain     HOSPITAL COURSE:   1. NSTEMI. Patient had a cardiac catheterization which showed proximal RCA 50% blocked. Medical management. Patient prescribed aspirin. Patient has an allergy to beta blockers. Prescribed Lipitor, imdur. I did not prescribe Plavix because the patient will go home on Eliquis. Cardiology follow-up as outpatient. 2. Persistent versus chronic DVT. The patient was on Xarelto for about a year. I switched the patient over to Eliquis. Recommend checking another ultrasound of the lower extremities in 6 months. 3. Acute kidney injury improved with IV fluid hydration. Creatinine 0.87 upon discharge. 4. History of COPD with lung infection history continue his usual medications 5. Episode of tachycardia likely SVT. I prescribed Cardizem CD 180 mg daily. 6. Glaucoma continue eyedrops 7. Recommend outpatient sleep study  DISCHARGE CONDITIONS:   Satisfactory  CONSULTS OBTAINED:  Treatment Team:  Dionisio David, MD  DRUG ALLERGIES:   Allergies  Allergen Reactions  . Timolol Maleate [Timolol Maleate] Shortness Of Breath   . Budesonide-Formoterol Fumarate Other (See Comments)    Patient reports dizziness and trembling/shaky feeling.  . Lisinopril     cough    DISCHARGE MEDICATIONS:   Discharge Medication List as of 08/20/2016 12:04 PM    START taking these medications   Details  apixaban (ELIQUIS) 5 MG TABS tablet 2 tablets twice a day for seven days then one tablet twice a day afterwards, Print    aspirin EC 81 MG EC tablet Take 1 tablet (81 mg total) by mouth daily., Starting Wed 08/21/2016, Print    atorvastatin (LIPITOR) 40 MG tablet Take 1 tablet (40 mg total) by mouth daily at 6 PM., Starting Tue 08/20/2016, Print    diltiazem (CARDIZEM CD) 180 MG 24 hr capsule Take 1 capsule (180 mg total) by mouth daily., Starting Wed 08/21/2016, Print    isosorbide mononitrate (IMDUR) 30 MG 24 hr tablet Take 1 tablet (30 mg total) by mouth daily., Starting Wed 08/21/2016, Print      CONTINUE these medications which have NOT CHANGED   Details  ADVAIR HFA 230-21 MCG/ACT inhaler Inhale 2 puffs into the lungs 2 (two) times daily. , Starting Fri 11/17/2015, Historical Med    azithromycin (ZITHROMAX) 250 MG tablet Take 250 mg by mouth daily. , Starting Sat 12/23/2015, Historical Med    brimonidine (ALPHAGAN) 0.15 % ophthalmic solution Place 1 drop into both eyes 2 (two) times daily. , Starting Thu 10/26/2015, Historical Med    Calcium Carb-Cholecalciferol (CALCIUM 600 + D PO) Take  1 tablet by mouth 2 (two) times daily., Historical Med    dorzolamide (TRUSOPT) 2 % ophthalmic solution Place 1 drop into both eyes 2 (two) times daily. , Starting Thu 08/31/2015, Historical Med    fluticasone (FLONASE) 50 MCG/ACT nasal spray 2 sprays by Each Nare route Two (2) times a day., Historical Med    gabapentin (NEURONTIN) 400 MG capsule Take 400 mg by mouth daily. , Starting Mon 09/18/2015, Historical Med    latanoprost (XALATAN) 0.005 % ophthalmic solution Place 1 drop into both eyes at bedtime. , Historical Med    Multiple Vitamin  (MULTIVITAMIN) tablet Take 1 tablet by mouth daily., Historical Med    tiotropium (SPIRIVA HANDIHALER) 18 MCG inhalation capsule Place 18 mcg into inhaler and inhale daily. , Starting Mon 02/06/2015, Until Mon 08/18/2017, Historical Med      STOP taking these medications     meloxicam (MOBIC) 15 MG tablet      XARELTO 20 MG TABS tablet          DISCHARGE INSTRUCTIONS:   Follow-up with Dr. Humphrey Rolls on Friday Follow-up PMD 1-2 weeks  If you experience worsening of your admission symptoms, develop shortness of breath, life threatening emergency, suicidal or homicidal thoughts you must seek medical attention immediately by calling 911 or calling your MD immediately  if symptoms less severe.  You Must read complete instructions/literature along with all the possible adverse reactions/side effects for all the Medicines you take and that have been prescribed to you. Take any new Medicines after you have completely understood and accept all the possible adverse reactions/side effects.   Please note  You were cared for by a hospitalist during your hospital stay. If you have any questions about your discharge medications or the care you received while you were in the hospital after you are discharged, you can call the unit and asked to speak with the hospitalist on call if the hospitalist that took care of you is not available. Once you are discharged, your primary care physician will handle any further medical issues. Please note that NO REFILLS for any discharge medications will be authorized once you are discharged, as it is imperative that you return to your primary care physician (or establish a relationship with a primary care physician if you do not have one) for your aftercare needs so that they can reassess your need for medications and monitor your lab values.    Today   CHIEF COMPLAINT:   Chief Complaint  Patient presents with  . Loss of Consciousness  . Emesis    HISTORY OF PRESENT  ILLNESS:  Hooper Griesel  is a 76 y.o. male was found to have an elevated troponin after a syncopal episode at urgent care.   VITAL SIGNS:  Blood pressure 108/60, pulse 82, temperature 98 F (36.7 C), temperature source Oral, resp. rate 18, height 6\' 2"  (1.88 m), weight 79.4 kg (175 lb), SpO2 97 %.    PHYSICAL EXAMINATION:  GENERAL:  76 y.o.-year-old patient lying in the bed with no acute distress.  EYES: Pupils equal, round, reactive to light and accommodation. No scleral icterus. Extraocular muscles intact.  HEENT: Head atraumatic, normocephalic. Oropharynx and nasopharynx clear.  NECK:  Supple, no jugular venous distention. No thyroid enlargement, no tenderness.  LUNGS: Decreased breath sounds bilaterally, no wheezing, rales,rhonchi or crepitation. No use of accessory muscles of respiration.  CARDIOVASCULAR: S1, S2 normal. No murmurs, rubs, or gallops.  ABDOMEN: Soft, non-tender, non-distended. Bowel sounds present. No organomegaly or mass.  EXTREMITIES: No pedal edema, cyanosis, or clubbing.  NEUROLOGIC: Cranial nerves II through XII are intact. Muscle strength 5/5 in all extremities. Sensation intact. Gait not checked.  PSYCHIATRIC: The patient is alert and oriented x 3.  SKIN: No obvious rash, lesion, or ulcer.   DATA REVIEW:   CBC  Recent Labs Lab 08/20/16 0345  WBC 7.1  HGB 12.2*  HCT 35.2*  PLT 166    Chemistries   Recent Labs Lab 08/20/16 0345  NA 136  K 4.2  CL 110  CO2 22  GLUCOSE 102*  BUN 13  CREATININE 0.87  CALCIUM 7.5*    Cardiac Enzymes  Recent Labs Lab 08/19/16 0125  TROPONINI 8.75*     RADIOLOGY:  US Venous Img Lower Bilateral  Result Date: 08/20/2016 CLINICAL DATA:  Left lower extremity swelling.  History of DVT. EXAM: BILATERAL LOWER EXTREMITY VENOUS DOPPLER ULTRASOUND TECHNIQUE: Gray-scale sonography with graded compression, as well as color Doppler and duplex ultrasound were performed to evaluate the lower extremity deep venous systems  from the level of the common femoral vein and including the common femoral, femoral, profunda femoral, popliteal and calf veins including the posterior tibial, peroneal and gastrocnemius veins when visible. The superficial great saphenous vein was also interrogated. Spectral Doppler was utilized to evaluate flow at rest and with distal augmentation maneuvers in the common femoral, femoral and popliteal veins. COMPARISON:  None. FINDINGS: RIGHT LOWER EXTREMITY Common Femoral Vein: No evidence of thrombus. Normal compressibility, respiratory phasicity and response to augmentation. Saphenofemoral Junction: No evidence of thrombus. Normal compressibility and flow on color Doppler imaging. Profunda Femoral Vein: No evidence of thrombus. Normal compressibility and flow on color Doppler imaging. Femoral Vein: No evidence of thrombus. Normal compressibility, respiratory phasicity and response to augmentation. Popliteal Vein: No evidence of thrombus. Normal compressibility, respiratory phasicity and response to augmentation. Calf Veins: No evidence of thrombus. Normal compressibility and flow on color Doppler imaging. Superficial Great Saphenous Vein: No evidence of thrombus. Normal compressibility and flow on color Doppler imaging. Venous Reflux:  None. Other Findings:  None. LEFT LOWER EXTREMITY Common Femoral Vein: No evidence of thrombus. Normal compressibility, respiratory phasicity and response to augmentation. Saphenofemoral Junction: No evidence of thrombus. Normal compressibility and flow on color Doppler imaging. Profunda Femoral Vein: No evidence of thrombus. Normal compressibility and flow on color Doppler imaging. Femoral Vein: Nearly occlusive DVT in the left mid/ distal superficial femoral vein. Popliteal Vein: Occlusive DVT within the left popliteal vein, with adjacent collateral vein extending upwards to the level of the femoral vein. Calf Veins: No evidence of thrombus. Normal compressibility and flow on  color Doppler imaging. Superficial Great Saphenous Vein: No evidence of thrombus. Normal compressibility and flow on color Doppler imaging. Venous Reflux:  None. Other Findings:  None. IMPRESSION: 1. Occlusive DVT within the left popliteal vein, with additional nearly occlusive DVT in the overlying distal SFV. The DVT within the left popliteal vein appears chronic with decreased venous diameter and associated collateral formation. The additional contiguous nearly occlusive DVT in the lower left SFV is favored to be chronic as well. 2. No evidence of acute DVT within either lower extremity. Electronically Signed   By: Franki Cabot M.D.   On: 08/20/2016 11:12    Management plans discussed with the patient, family and they are in agreement.  CODE STATUS:  Code Status History    Date Active Date Inactive Code Status Order ID Comments User Context   08/18/2016  4:34 PM 08/20/2016  4:11 PM Full  Code [id number]  Vaughan Basta, MD Inpatient   11/04/2014  7:09 AM 11/09/2014  2:50 PM Full Code [id number]  Molly Maduro, MD ED    Advance Directive Documentation   Flowsheet Row Most Recent Value  Type of Advance Directive  Healthcare Power of Attorney, Living will  Pre-existing out of facility DNR order (yellow form or pink MOST form)  No data  "MOST" Form in Place?  No data      TOTAL TIME TAKING CARE OF THIS PATIENT: 40 minutes.    Loletha Grayer M.D on 08/20/2016 at 5:03 PM  Between 7am to 6pm - Pager - [phone]  After 6pm go to [email or web address] - password [email or web address]  [facility] Office  [phone]  CC: Primary care physician; Wilhemena Durie, MD

## 2016-08-20 NOTE — Progress Notes (Signed)
Pt has orders to be discharged. Discharge instructions given and pt has no additional questions at this time. Medication regimen reviewed and pt educated. Pt verbalized understanding and has no additional questions. Telemetry box removed. IV removed and site in good condition. Pt stable and waiting for transportation.   Allesha Aronoff RN 

## 2016-08-22 DIAGNOSIS — M533 Sacrococcygeal disorders, not elsewhere classified: Secondary | ICD-10-CM | POA: Diagnosis not present

## 2016-08-23 DIAGNOSIS — I82532 Chronic embolism and thrombosis of left popliteal vein: Secondary | ICD-10-CM | POA: Diagnosis not present

## 2016-08-23 DIAGNOSIS — I25119 Atherosclerotic heart disease of native coronary artery with unspecified angina pectoris: Secondary | ICD-10-CM | POA: Diagnosis not present

## 2016-08-27 ENCOUNTER — Encounter: Payer: Self-pay | Admitting: Family Medicine

## 2016-08-27 ENCOUNTER — Ambulatory Visit (INDEPENDENT_AMBULATORY_CARE_PROVIDER_SITE_OTHER): Payer: PPO | Admitting: Family Medicine

## 2016-08-27 VITALS — BP 122/76 | HR 77 | Temp 97.5°F | Resp 16 | Wt 178.0 lb

## 2016-08-27 DIAGNOSIS — I214 Non-ST elevation (NSTEMI) myocardial infarction: Secondary | ICD-10-CM

## 2016-08-27 DIAGNOSIS — I10 Essential (primary) hypertension: Secondary | ICD-10-CM

## 2016-08-27 DIAGNOSIS — E785 Hyperlipidemia, unspecified: Secondary | ICD-10-CM

## 2016-08-27 DIAGNOSIS — I82419 Acute embolism and thrombosis of unspecified femoral vein: Secondary | ICD-10-CM | POA: Diagnosis not present

## 2016-08-27 DIAGNOSIS — I479 Paroxysmal tachycardia, unspecified: Secondary | ICD-10-CM | POA: Diagnosis not present

## 2016-08-27 DIAGNOSIS — I8290 Acute embolism and thrombosis of unspecified vein: Secondary | ICD-10-CM | POA: Diagnosis not present

## 2016-08-27 MED ORDER — ATORVASTATIN CALCIUM 40 MG PO TABS: 40.0000 mg | ORAL_TABLET | Freq: Every day | ORAL | 12 refills | Status: DC

## 2016-08-27 MED ORDER — RIVAROXABAN 20 MG PO TABS: 20.0000 mg | ORAL_TABLET | Freq: Every day | ORAL | 11 refills | Status: DC

## 2016-08-27 MED ORDER — DILTIAZEM HCL ER COATED BEADS 180 MG PO CP24: 180.0000 mg | ORAL_CAPSULE | Freq: Every day | ORAL | 12 refills | Status: DC

## 2016-08-27 NOTE — Progress Notes (Signed)
Subjective:  HPI Hospital stay- 3/4/-3/6 Admit diagnoses- NSTEMI D/c diagnoses- NSTEMI  Pt had cardiac cath- RCA 50% block Med mgnt (ASA) persistent vs chronic DVT change Xarelto to Eliquis recommended u/s lower extremity in 6 months  acute kidney injury Hx COPD Episode of SVT- Cardizem CD 180 mg Glaucoma continue eye drops recommend op sleep study  Medication changes: Start Eliquis, ASA, Atorvastatin, cardizem and Isosorbide.  Discontinue Meloxicam and Xarelto   Pt reports that he is feeling well, " I am feeling back to normal"   Pt reports that he was feeling bad for 3 days then went to the urgent care because he thought he passed out and vomited. So they sent him to the ER.   Prior to Admission medications   Medication Sig Start Date End Date Taking? Authorizing Provider  ADVAIR HFA 230-21 MCG/ACT inhaler Inhale 2 puffs into the lungs 2 (two) times daily.  11/17/15  Yes Historical Provider, MD  apixaban (ELIQUIS) 5 MG TABS tablet 2 tablets twice a day for seven days then one tablet twice a day afterwards 08/20/16  Yes Loletha Grayer, MD  aspirin EC 81 MG EC tablet Take 1 tablet (81 mg total) by mouth daily. 08/21/16  Yes Loletha Grayer, MD  atorvastatin (LIPITOR) 40 MG tablet Take 1 tablet (40 mg total) by mouth daily at 6 PM. 08/20/16  Yes Loletha Grayer, MD  azithromycin (ZITHROMAX) 250 MG tablet Take 250 mg by mouth daily.  12/23/15  Yes Historical Provider, MD  brimonidine (ALPHAGAN) 0.15 % ophthalmic solution Place 1 drop into both eyes 2 (two) times daily.  10/26/15  Yes Historical Provider, MD  Calcium Carb-Cholecalciferol (CALCIUM 600 + D PO) Take 1 tablet by mouth 2 (two) times daily.   Yes Historical Provider, MD  diltiazem (CARDIZEM CD) 180 MG 24 hr capsule Take 1 capsule (180 mg total) by mouth daily. 08/21/16  Yes Raef Sprigg Leslye Peer, MD  dorzolamide (TRUSOPT) 2 % ophthalmic solution Place 1 drop into both eyes 2 (two) times daily.  08/31/15  Yes Historical Provider, MD    fluticasone (FLONASE) 50 MCG/ACT nasal spray 2 sprays by Each Nare route Two (2) times a day. 03/27/16 03/27/17 Yes Historical Provider, MD  gabapentin (NEURONTIN) 400 MG capsule Take 400 mg by mouth daily.  09/18/15  Yes Historical Provider, MD  isosorbide mononitrate (IMDUR) 30 MG 24 hr tablet Take 1 tablet (30 mg total) by mouth daily. 08/21/16  Yes Arnet Hofferber Leslye Peer, MD  latanoprost (XALATAN) 0.005 % ophthalmic solution Place 1 drop into both eyes at bedtime.    Yes Historical Provider, MD  Multiple Vitamin (MULTIVITAMIN) tablet Take 1 tablet by mouth daily.   Yes Historical Provider, MD  tiotropium (SPIRIVA HANDIHALER) 18 MCG inhalation capsule Place 18 mcg into inhaler and inhale daily.  02/06/15 08/18/17 Yes Historical Provider, MD    Patient Active Problem List   Diagnosis Date Noted  . NSTEMI (non-ST elevated myocardial infarction) ([place]) 08/18/2016  . Chronic obstructive pulmonary disease ([place]) 01/02/2016  . Recurrent pneumonia 12/21/2014  . Pneumothorax, right 11/04/2014  . Basal cell carcinoma 10/19/2014  . Abnormal liver enzymes 10/19/2014  . Personal history of methicillin resistant Staphylococcus aureus 10/19/2014  . Cannot sleep 10/19/2014  . Heel pain 10/19/2014  . Pain in shoulder 10/19/2014  . Back strain 10/19/2014  . Pulmonary aspergillosis, allergic bronchopulmonary type ([place]) 07/30/2014  . ABPA (allergic bronchopulmonary aspergillosis) ([place]) 07/30/2014  . CAP (community acquired pneumonia) 05/03/2014  . Abnormal CXR 04/01/2014  . Paroxysmal tachycardia ([place])  06/23/2013  . Asymptomatic PVCs 06/23/2013  . Beat, premature ventricular 06/23/2013  . Cough 10/19/2012  . Allergic rhinitis 01/17/2009  . Asthma, chronic obstructive, without status asthmaticus ([place]) 11/04/2008  . CAFL (chronic airflow limitation) ([place]) 11/04/2008  . Essential hypertension 02/09/2008  . Arthropathia 08/28/2007  . Disorder of mitral valve 07/08/2005  . Benign prostatic hypertrophy without  urinary obstruction 05/16/1997  . LBP (low back pain) 05/16/1997  . Degenerative arthritis of hip 03/18/1995    Past Medical History:  Diagnosis Date  . AB (asthmatic bronchitis)   . Allergic rhinitis   . Arthropathy   . Asthma   . Cellulitis   . DVT (deep venous thrombosis) ([place])   . Foreign body in skin   . Glaucoma   . Hx MRSA infection   . Hx MRSA infection   . Hypertension   . Insomnia   . Insomnia   . Mitral valve disorder   . Osteoarthrosis   . Upper back strain     Social History   Social History  . Marital status: Married    Spouse name: Everlene Farrier  . Number of children: 2  . Years of education: College   Occupational History  . Retired Retired    Chief Financial Officer   Social History Main Topics  . Smoking status: Former Smoker    Packs/day: 0.50    Years: 3.00    Types: Cigarettes    Quit date: 06/17/1958  . Smokeless tobacco: Never Used     Comment: smoking while in college  . Alcohol use No     Comment: OCCASSIONAL WINE ONLY  . Drug use: No  . Sexual activity: Not on file   Other Topics Concern  . Not on file   Social History Narrative   He is a married father of 2 daughters.   He smoked casually while in college for about 3 years less than a pack a day.   He drinks 3-4 glass of wine at night. He also has about 2 servings of coffee a day   He is a very active gentleman with [employer] exercise program at least 3-4 days a week.   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   In addition to this standing his exercise, he does lots of gardening and uses a chain saw to cut wood.             Allergies  Allergen Reactions  . Timolol Maleate [Timolol Maleate] Shortness Of Breath  . Budesonide-Formoterol Fumarate Other (See Comments)    Patient reports dizziness and trembling/shaky feeling.  . Lisinopril     cough    Review of Systems  Constitutional: Negative.   HENT: Negative.   Eyes: Negative.   Respiratory: Negative.    Cardiovascular: Negative.   Gastrointestinal: Negative.   Genitourinary: Negative.   Musculoskeletal: Negative.   Skin: Negative.   Neurological: Negative.   Endo/Heme/Allergies: Negative.   Psychiatric/Behavioral: Negative.     Immunization History  Administered Date(s) Administered  . Influenza Split 03/02/2012, 03/17/2013, 05/07/2014  . Influenza Whole 03/24/2007, 03/15/2009, 02/15/2010  . Influenza, High Dose Seasonal PF 05/04/2015, 03/21/2016  . Pneumococcal Conjugate-13 11/17/2013  . Pneumococcal Polysaccharide-23 11/19/2004, 03/19/2007, 03/17/2009  . Td 05/16/1997, 05/14/2007, 09/11/2010  . Zoster 01/02/2011    Objective:  BP 122/76 (BP Location: Left Arm, Patient Position: Sitting, Cuff Size: Normal)   Pulse 77   Temp 97.5 F (36.4 C) (Oral)   Resp 16   Wt 178  lb (80.7 kg)   SpO2 94%   BMI 22.85 kg/m   Physical Exam  Constitutional: He is oriented to person, place, and time and well-developed, well-nourished, and in no distress.  Eyes: Conjunctivae and EOM are normal. Pupils are equal, round, and reactive to light.  Neck: Normal range of motion. Neck supple.  Cardiovascular: Normal rate, regular rhythm, normal heart sounds and intact distal pulses.   Pulmonary/Chest: Effort normal and breath sounds normal.  Abdominal: Soft. Bowel sounds are normal.  Neurological: He is alert and oriented to person, place, and time. He has normal reflexes. Gait normal. GCS score is 15.  Skin: Skin is warm and dry.  Psychiatric: Mood, memory, affect and judgment normal.    Lab Results  Component Value Date   WBC 7.1 08/20/2016   HGB 12.2 (L) 08/20/2016   HCT 35.2 (L) 08/20/2016   PLT 166 08/20/2016   GLUCOSE 102 (H) 08/20/2016   CHOL 141 08/18/2016   TRIG 57 08/18/2016   HDL 55 08/18/2016   LDLCALC 75 08/18/2016   TSH 1.260 01/02/2016   PSA 3.6 11/17/2013   INR 1.54 08/18/2016   HGBA1C 5.4 08/18/2016    CMP     Component Value Date/Time   NA 136 08/20/2016 0345    NA 139 01/02/2016 1137   NA 138 04/08/2014 0540   K 4.2 08/20/2016 0345   K 4.4 04/08/2014 0540   CL 110 08/20/2016 0345   CL 104 04/08/2014 0540   CO2 22 08/20/2016 0345   CO2 27 04/08/2014 0540   GLUCOSE 102 (H) 08/20/2016 0345   GLUCOSE 134 (H) 04/08/2014 0540   BUN 13 08/20/2016 0345   BUN 14 01/02/2016 1137   BUN 15 04/08/2014 0540   CREATININE 0.87 08/20/2016 0345   CREATININE 1.23 04/08/2014 0540   CALCIUM 7.5 (L) 08/20/2016 0345   CALCIUM 7.9 (L) 04/08/2014 0540   PROT 6.4 01/02/2016 1137   PROT 7.2 04/07/2014 1745   ALBUMIN 4.0 01/02/2016 1137   ALBUMIN 3.0 (L) 04/07/2014 1745   AST 32 01/02/2016 1137   AST 15 04/07/2014 1745   ALT 22 01/02/2016 1137   ALT 24 04/07/2014 1745   ALKPHOS 82 01/02/2016 1137   ALKPHOS 94 04/07/2014 1745   BILITOT 0.6 01/02/2016 1137   BILITOT 0.8 04/07/2014 1745   GFRNONAA >60 08/20/2016 0345   GFRNONAA >60 04/08/2014 0540   GFRNONAA >60 12/22/2013 2235   GFRAA >60 08/20/2016 0345   GFRAA >60 04/08/2014 0540   GFRAA >60 12/22/2013 2235    Assessment and Plan :  1. Essential hypertension   2. NSTEMI (non-ST elevated myocardial infarction) ([place])   3. Hyperlipidemia, unspecified hyperlipidemia type Lipids met c end of the month  - atorvastatin (LIPITOR) 40 MG tablet; Take 1 tablet (40 mg total) by mouth daily at 6 PM.  Dispense: 30 tablet; Refill: 12 - Lipid Panel With LDL/HDL Ratio - Comprehensive metabolic panel  4. Paroxysmal tachycardia (HCC)  - diltiazem (CARDIZEM CD) 180 MG 24 hr capsule; Take 1 capsule (180 mg total) by mouth daily.  Dispense: 30 capsule; Refill: 12  5. Acute deep vein thrombosis (DVT) of femoral vein, unspecified laterality (HCC)  - rivaroxaban (XARELTO) 20 MG TABS tablet; Take 1 tablet (20 mg total) by mouth daily with supper.  Dispense: 30 tablet; Refill: 11  HPI, Exam, and A&P Transcribed under the direction and in the presence of Hulbert Branscome L. Cranford Mon, MD  Electronically Signed: Katina Dung, CMA I have done the exam  and reviewed the above chart and it is accurate to the best of my knowledge. Development worker, community has been used in this note in any air is in the dictation or transcription are unintentional.  [facility] 08/27/2016 3:27 PM

## 2016-08-27 NOTE — Patient Instructions (Signed)
Get labs at the end of the month.

## 2016-08-28 DIAGNOSIS — J449 Chronic obstructive pulmonary disease, unspecified: Secondary | ICD-10-CM | POA: Diagnosis not present

## 2016-08-29 ENCOUNTER — Emergency Department: Payer: PPO

## 2016-08-29 ENCOUNTER — Inpatient Hospital Stay
Admission: EM | Admit: 2016-08-29 | Discharge: 2016-08-31 | DRG: 193 | Disposition: A | Discharge status: Home / Self Care | Payer: PPO | Attending: Internal Medicine | Admitting: Internal Medicine

## 2016-08-29 ENCOUNTER — Encounter: Payer: Self-pay | Admitting: Emergency Medicine

## 2016-08-29 DIAGNOSIS — Z8249 Family history of ischemic heart disease and other diseases of the circulatory system: Secondary | ICD-10-CM | POA: Diagnosis not present

## 2016-08-29 DIAGNOSIS — J440 Chronic obstructive pulmonary disease with acute lower respiratory infection: Secondary | ICD-10-CM | POA: Diagnosis not present

## 2016-08-29 DIAGNOSIS — Z800 Family history of malignant neoplasm of digestive organs: Secondary | ICD-10-CM | POA: Diagnosis not present

## 2016-08-29 DIAGNOSIS — J449 Chronic obstructive pulmonary disease, unspecified: Secondary | ICD-10-CM | POA: Diagnosis not present

## 2016-08-29 DIAGNOSIS — Z808 Family history of malignant neoplasm of other organs or systems: Secondary | ICD-10-CM

## 2016-08-29 DIAGNOSIS — G9341 Metabolic encephalopathy: Secondary | ICD-10-CM | POA: Diagnosis not present

## 2016-08-29 DIAGNOSIS — Z8614 Personal history of Methicillin resistant Staphylococcus aureus infection: Secondary | ICD-10-CM | POA: Diagnosis not present

## 2016-08-29 DIAGNOSIS — Z87891 Personal history of nicotine dependence: Secondary | ICD-10-CM

## 2016-08-29 DIAGNOSIS — I214 Non-ST elevation (NSTEMI) myocardial infarction: Secondary | ICD-10-CM | POA: Diagnosis present

## 2016-08-29 DIAGNOSIS — I82409 Acute embolism and thrombosis of unspecified deep veins of unspecified lower extremity: Secondary | ICD-10-CM | POA: Diagnosis not present

## 2016-08-29 DIAGNOSIS — I2510 Atherosclerotic heart disease of native coronary artery without angina pectoris: Secondary | ICD-10-CM | POA: Diagnosis present

## 2016-08-29 DIAGNOSIS — R05 Cough: Secondary | ICD-10-CM | POA: Diagnosis not present

## 2016-08-29 DIAGNOSIS — Z86718 Personal history of other venous thrombosis and embolism: Secondary | ICD-10-CM | POA: Diagnosis not present

## 2016-08-29 DIAGNOSIS — Y95 Nosocomial condition: Secondary | ICD-10-CM | POA: Diagnosis present

## 2016-08-29 DIAGNOSIS — R4182 Altered mental status, unspecified: Secondary | ICD-10-CM | POA: Diagnosis not present

## 2016-08-29 DIAGNOSIS — E785 Hyperlipidemia, unspecified: Secondary | ICD-10-CM | POA: Diagnosis present

## 2016-08-29 DIAGNOSIS — Z7982 Long term (current) use of aspirin: Secondary | ICD-10-CM

## 2016-08-29 DIAGNOSIS — H409 Unspecified glaucoma: Secondary | ICD-10-CM | POA: Diagnosis present

## 2016-08-29 DIAGNOSIS — J189 Pneumonia, unspecified organism: Secondary | ICD-10-CM | POA: Diagnosis not present

## 2016-08-29 DIAGNOSIS — Z7901 Long term (current) use of anticoagulants: Secondary | ICD-10-CM

## 2016-08-29 DIAGNOSIS — I10 Essential (primary) hypertension: Secondary | ICD-10-CM | POA: Diagnosis not present

## 2016-08-29 DIAGNOSIS — R000 Tachycardia, unspecified: Secondary | ICD-10-CM | POA: Diagnosis not present

## 2016-08-29 LAB — CBC WITH DIFFERENTIAL/PLATELET
Basophils Absolute: 0.1 10*3/uL (ref 0–0.1)
Basophils Relative: 1 %
EOS PCT: 4 %
Eosinophils Absolute: 0.4 10*3/uL (ref 0–0.7)
HEMATOCRIT: 47 % (ref 40.0–52.0)
HEMOGLOBIN: 15.9 g/dL (ref 13.0–18.0)
LYMPHS ABS: 1.2 10*3/uL (ref 1.0–3.6)
LYMPHS PCT: 11 %
MCH: 32.8 pg (ref 26.0–34.0)
MCHC: 33.9 g/dL (ref 32.0–36.0)
MCV: 96.9 fL (ref 80.0–100.0)
Monocytes Absolute: 1.2 10*3/uL — ABNORMAL HIGH (ref 0.2–1.0)
Monocytes Relative: 11 %
NEUTROS ABS: 7.7 10*3/uL — AB (ref 1.4–6.5)
Neutrophils Relative %: 73 %
PLATELETS: 338 10*3/uL (ref 150–440)
RBC: 4.85 MIL/uL (ref 4.40–5.90)
RDW: 15.5 % — ABNORMAL HIGH (ref 11.5–14.5)
WBC: 10.6 10*3/uL (ref 3.8–10.6)

## 2016-08-29 LAB — URINALYSIS, COMPLETE (UACMP) WITH MICROSCOPIC
BACTERIA UA: NONE SEEN
Bilirubin Urine: NEGATIVE
Glucose, UA: NEGATIVE mg/dL
Hgb urine dipstick: NEGATIVE
KETONES UR: NEGATIVE mg/dL
LEUKOCYTES UA: NEGATIVE
Nitrite: NEGATIVE
PROTEIN: NEGATIVE mg/dL
Specific Gravity, Urine: 1.014 (ref 1.005–1.030)
Squamous Epithelial / HPF: NONE SEEN
pH: 8 (ref 5.0–8.0)

## 2016-08-29 LAB — COMPREHENSIVE METABOLIC PANEL
ALBUMIN: 3.9 g/dL (ref 3.5–5.0)
ALT: 27 U/L (ref 17–63)
ANION GAP: 10 (ref 5–15)
AST: 30 U/L (ref 15–41)
Alkaline Phosphatase: 93 U/L (ref 38–126)
BUN: 14 mg/dL (ref 6–20)
CHLORIDE: 100 mmol/L — AB (ref 101–111)
CO2: 26 mmol/L (ref 22–32)
Calcium: 9.2 mg/dL (ref 8.9–10.3)
Creatinine, Ser: 1.22 mg/dL (ref 0.61–1.24)
GFR calc Af Amer: >60 mL/min (ref >60)
GFR, EST NON AFRICAN AMERICAN: 55 mL/min — AB (ref >60)
Glucose, Bld: 90 mg/dL (ref 65–99)
POTASSIUM: 4.3 mmol/L (ref 3.5–5.1)
Sodium: 136 mmol/L (ref 135–145)
Total Bilirubin: 0.9 mg/dL (ref 0.3–1.2)
Total Protein: 7.4 g/dL (ref 6.5–8.1)

## 2016-08-29 LAB — INFLUENZA PANEL BY PCR (TYPE A & B)
INFLBPCR: NEGATIVE
Influenza A By PCR: NEGATIVE

## 2016-08-29 LAB — LACTIC ACID, PLASMA: LACTIC ACID, VENOUS: 1.8 mmol/L (ref 0.5–1.9)

## 2016-08-29 LAB — TROPONIN I: TROPONIN I: 0.17 ng/mL — AB (ref <0.03)

## 2016-08-29 MED ORDER — DEXTROSE 5 % IV SOLN: 2.0000 g | Freq: Once | INTRAVENOUS | Status: DC

## 2016-08-29 MED ORDER — ISOSORBIDE MONONITRATE ER 30 MG PO TB24: 30.0000 mg | ORAL_TABLET | Freq: Every day | ORAL | Status: DC

## 2016-08-29 MED ORDER — ONDANSETRON HCL 4 MG PO TABS: 4.0000 mg | ORAL_TABLET | Freq: Four times a day (QID) | ORAL | Status: DC | PRN

## 2016-08-29 MED ORDER — BRIMONIDINE TARTRATE 0.15 % OP SOLN
1.0000 [drp] | Freq: Two times a day (BID) | OPHTHALMIC | Status: DC
  Administered 2016-08-29: 1 [drp] via OPHTHALMIC
  Filled 2016-08-29: qty 5

## 2016-08-29 MED ORDER — APIXABAN 5 MG PO TABS
5.0000 mg | ORAL_TABLET | Freq: Two times a day (BID) | ORAL | Status: DC
  Administered 2016-08-29: 5 mg via ORAL
  Filled 2016-08-29: qty 1

## 2016-08-29 MED ORDER — CEFEPIME-DEXTROSE 2 GM/50ML IV SOLR
2.0000 g | Freq: Once | INTRAVENOUS | Status: AC
  Administered 2016-08-29: 2 g via INTRAVENOUS
  Filled 2016-08-29: qty 50

## 2016-08-29 MED ORDER — SODIUM CHLORIDE 0.9 % IV SOLN
1000.0000 mL | Freq: Once | INTRAVENOUS | Status: AC
  Administered 2016-08-29: 1000 mL via INTRAVENOUS

## 2016-08-29 MED ORDER — FLUTICASONE PROPIONATE 50 MCG/ACT NA SUSP
2.0000 | Freq: Every day | NASAL | Status: DC
  Filled 2016-08-29: qty 16

## 2016-08-29 MED ORDER — MOMETASONE FURO-FORMOTEROL FUM 200-5 MCG/ACT IN AERO
2.0000 | INHALATION_SPRAY | Freq: Two times a day (BID) | RESPIRATORY_TRACT | Status: DC
  Administered 2016-08-29: 2 via RESPIRATORY_TRACT
  Filled 2016-08-29: qty 8.8

## 2016-08-29 MED ORDER — SODIUM CHLORIDE 0.9 % IV SOLN
1250.0000 mg | INTRAVENOUS | Status: DC
  Administered 2016-08-30: 05:00:00 1250 mg via INTRAVENOUS
  Filled 2016-08-29 (×3): qty 1250

## 2016-08-29 MED ORDER — DILTIAZEM HCL ER COATED BEADS 180 MG PO CP24
180.0000 mg | ORAL_CAPSULE | Freq: Every day | ORAL | Status: DC
  Filled 2016-08-29: qty 1

## 2016-08-29 MED ORDER — GABAPENTIN 400 MG PO CAPS
400.0000 mg | ORAL_CAPSULE | Freq: Every day | ORAL | Status: DC
  Administered 2016-08-29: 400 mg via ORAL
  Filled 2016-08-29: qty 1

## 2016-08-29 MED ORDER — BENZONATATE 100 MG PO CAPS
100.0000 mg | ORAL_CAPSULE | Freq: Three times a day (TID) | ORAL | Status: DC
  Administered 2016-08-29: 100 mg via ORAL
  Filled 2016-08-29: qty 1

## 2016-08-29 MED ORDER — TIOTROPIUM BROMIDE MONOHYDRATE 18 MCG IN CAPS
18.0000 ug | ORAL_CAPSULE | Freq: Every day | RESPIRATORY_TRACT | Status: DC
  Administered 2016-08-29: 18 ug via RESPIRATORY_TRACT
  Filled 2016-08-29: qty 5

## 2016-08-29 MED ORDER — LATANOPROST 0.005 % OP SOLN
1.0000 [drp] | Freq: Every day | OPHTHALMIC | Status: DC
  Administered 2016-08-29: 1 [drp] via OPHTHALMIC
  Filled 2016-08-29 (×2): qty 2.5

## 2016-08-29 MED ORDER — ACETAMINOPHEN 325 MG PO TABS
650.0000 mg | ORAL_TABLET | Freq: Four times a day (QID) | ORAL | Status: DC | PRN
  Administered 2016-08-30 (×2): 650 mg via ORAL
  Filled 2016-08-29 (×2): qty 2

## 2016-08-29 MED ORDER — ONDANSETRON HCL 4 MG/2ML IJ SOLN: 4.0000 mg | Freq: Four times a day (QID) | INTRAMUSCULAR | Status: DC | PRN

## 2016-08-29 MED ORDER — ASPIRIN EC 81 MG PO TBEC: 81.0000 mg | DELAYED_RELEASE_TABLET | Freq: Every day | ORAL | Status: DC

## 2016-08-29 MED ORDER — DEXTROSE 5 % IV SOLN
2.0000 g | Freq: Two times a day (BID) | INTRAVENOUS | Status: DC
  Administered 2016-08-30 – 2016-08-31 (×3): 2 g via INTRAVENOUS
  Filled 2016-08-29 (×5): qty 2

## 2016-08-29 MED ORDER — ATORVASTATIN CALCIUM 20 MG PO TABS: 40.0000 mg | ORAL_TABLET | Freq: Every day | ORAL | Status: DC

## 2016-08-29 MED ORDER — ACETAMINOPHEN 650 MG RE SUPP
650.0000 mg | Freq: Four times a day (QID) | RECTAL | Status: DC | PRN
Start: 2016-08-29 — End: 2016-08-31

## 2016-08-29 MED ORDER — DORZOLAMIDE HCL 2 % OP SOLN
1.0000 [drp] | Freq: Two times a day (BID) | OPHTHALMIC | Status: DC
  Administered 2016-08-29: 1 [drp] via OPHTHALMIC
  Filled 2016-08-29: qty 10

## 2016-08-29 MED ORDER — ADULT MULTIVITAMIN W/MINERALS CH: 1.0000 | ORAL_TABLET | Freq: Every day | ORAL | Status: DC

## 2016-08-29 MED ORDER — SODIUM CHLORIDE 0.9 % IV SOLN
INTRAVENOUS | Status: AC
  Administered 2016-08-29: via INTRAVENOUS

## 2016-08-29 MED ORDER — GABAPENTIN 400 MG PO CAPS: 400.0000 mg | ORAL_CAPSULE | Freq: Every day | ORAL | Status: DC

## 2016-08-29 MED ORDER — VANCOMYCIN HCL IN DEXTROSE 1-5 GM/200ML-% IV SOLN
1000.0000 mg | Freq: Once | INTRAVENOUS | Status: AC
  Administered 2016-08-29: 1000 mg via INTRAVENOUS
  Filled 2016-08-29: qty 200

## 2016-08-29 NOTE — H&P (Signed)
[facility] NAME: [name]    MR#:  [id number]  DATE OF BIRTH:  August 26, 1940  DATE OF ADMISSION:  08/29/2016  PRIMARY CARE PHYSICIAN: Wilhemena Durie, MD   REQUESTING/REFERRING PHYSICIAN: Dr. Lenise Arena  CHIEF COMPLAINT:   Chief Complaint  Patient presents with  . Altered Mental Status  . Fever    HISTORY OF PRESENT ILLNESS:  [name]  is a 76 y.o. male with a known history of Asthma/COPD not on home oxygen, transbronchial lung biopsy revealing aspergillosis in 2016, hypertension, glaucoma, history of DVT on Xarelto which was changed to eliquis last week at discharge presents to the hospital secondary to confusion, feeling weak and having low-grade fevers. Patient was discharged last week after he was treated with NSTEMI, cardiac catheterization revealing 50% RCA occlusion which was treated medically. Lower extremity Doppler showing chronic clot in the lower leg and his anticoagulation was changed to eliquis at discharge. Patient says she has been feeling fine up until this afternoon. He felt chills and was very weak, had a low-grade fever of 100.67F. According to his wife, he appeared more confused and couldn't get his words right. He has chronic cough and dyspnea from his COPD and lung disease. No change in his cough or shortness of breath noted by family. No nausea or vomiting. No recent travel. His only exposure to sick contacts was being in the hospital last week. His chest x-ray from last admission was clear but x-ray from today showing bilateral upper lobe infiltrates, possibly pneumonia.  PAST MEDICAL HISTORY:   Past Medical History:  Diagnosis Date  . AB (asthmatic bronchitis)   . Allergic rhinitis   . Arthropathy   . Asthma   . Cellulitis   . DVT (deep venous thrombosis) ([place])   . Foreign body in skin   . Glaucoma   . Hx MRSA infection   . Hx MRSA infection   . Hypertension   . Insomnia   . Insomnia     . Mitral valve disorder   . Osteoarthrosis   . Upper back strain     PAST SURGICAL HISTORY:   Past Surgical History:  Procedure Laterality Date  . APPENDECTOMY    . BRONCHOSCOPY    . LEFT HEART CATH AND CORONARY ANGIOGRAPHY N/A 08/19/2016   Procedure: Left Heart Cath and Coronary Angiography;  Surgeon: Dionisio David, MD;  Location: [facility];  Service: Cardiovascular;  Laterality: N/A;  . NASAL SINUS SURGERY  09/27/2014    SOCIAL HISTORY:   Social History  Substance Use Topics  . Smoking status: Former Smoker    Packs/day: 0.50    Years: 3.00    Types: Cigarettes    Quit date: 06/17/1958  . Smokeless tobacco: Never Used     Comment: smoking while in college  . Alcohol use No     Comment: OCCASSIONAL WINE ONLY    FAMILY HISTORY:   Family History  Problem Relation Age of Onset  . Breast cancer Sister   . Heart failure Mother   . Pancreatic cancer Sister   . Alzheimer's disease Father   . Brain cancer Daughter     DRUG ALLERGIES:   Allergies  Allergen Reactions  . Timolol Maleate [Timolol Maleate] Shortness Of Breath  . Budesonide-Formoterol Fumarate Other (See Comments)    Patient reports dizziness and trembling/shaky feeling.  . Lisinopril     cough    REVIEW OF SYSTEMS:  Review of Systems  Constitutional: Positive for chills, fever and malaise/fatigue. Negative for weight loss.  HENT: Negative for ear discharge, ear pain, hearing loss and nosebleeds.   Eyes: Negative for blurred vision, double vision and photophobia.  Respiratory: Positive for cough and shortness of breath. Negative for hemoptysis and wheezing.   Cardiovascular: Negative for chest pain, palpitations, orthopnea and leg swelling.  Gastrointestinal: Negative for abdominal pain, constipation, diarrhea, melena, nausea and vomiting.  Genitourinary: Negative for dysuria and urgency.  Musculoskeletal: Negative for back pain, myalgias and neck pain.  Skin: Negative for rash.   Neurological: Negative for dizziness, tingling, tremors, sensory change, speech change and focal weakness.  Endo/Heme/Allergies: Does not bruise/bleed easily.  Psychiatric/Behavioral: Negative for depression.       Confusion    MEDICATIONS AT HOME:   Prior to Admission medications   Medication Sig Start Date End Date Taking? Authorizing Provider  ADVAIR HFA 230-21 MCG/ACT inhaler Inhale 2 puffs into the lungs 2 (two) times daily.  11/17/15  Yes Historical Provider, MD  aspirin EC 81 MG EC tablet Take 1 tablet (81 mg total) by mouth daily. 08/21/16  Yes Loletha Grayer, MD  atorvastatin (LIPITOR) 40 MG tablet Take 1 tablet (40 mg total) by mouth daily at 6 PM. 08/27/16  Yes Jerrol Banana., MD  azithromycin (ZITHROMAX) 250 MG tablet Take 250 mg by mouth daily.   Yes Historical Provider, MD  brimonidine (ALPHAGAN) 0.15 % ophthalmic solution Place 1 drop into both eyes 2 (two) times daily.  10/26/15  Yes Historical Provider, MD  Calcium Carb-Cholecalciferol (CALCIUM 600 + D PO) Take 1 tablet by mouth 2 (two) times daily.   Yes Historical Provider, MD  diclofenac sodium (VOLTAREN) 1 % GEL Apply 2 g topically 4 (four) times daily.   Yes Historical Provider, MD  diltiazem (CARDIZEM CD) 180 MG 24 hr capsule Take 1 capsule (180 mg total) by mouth daily. 08/27/16  Yes Richard Maceo Pro., MD  dorzolamide (TRUSOPT) 2 % ophthalmic solution Place 1 drop into both eyes 2 (two) times daily.  08/31/15  Yes Historical Provider, MD  fluticasone (FLONASE) 50 MCG/ACT nasal spray 2 sprays by Each Nare route Two (2) times a day. 03/27/16 03/27/17 Yes Historical Provider, MD  gabapentin (NEURONTIN) 400 MG capsule Take 400 mg by mouth daily.  09/18/15  Yes Historical Provider, MD  isosorbide mononitrate (IMDUR) 30 MG 24 hr tablet Take 1 tablet (30 mg total) by mouth daily. 08/21/16  Yes Richard Leslye Peer, MD  latanoprost (XALATAN) 0.005 % ophthalmic solution Place 1 drop into both eyes at bedtime.    Yes Historical  Provider, MD  Multiple Vitamin (MULTIVITAMIN) tablet Take 1 tablet by mouth daily.   Yes Historical Provider, MD  rivaroxaban (XARELTO) 20 MG TABS tablet Take 1 tablet (20 mg total) by mouth daily with supper. 08/27/16  Yes Richard Maceo Pro., MD  tiotropium (SPIRIVA HANDIHALER) 18 MCG inhalation capsule Place 18 mcg into inhaler and inhale daily.  02/06/15 08/18/17 Yes Historical Provider, MD      VITAL SIGNS:  Blood pressure 126/68, pulse 93, temperature 100.3 F (37.9 C), temperature source Oral, resp. rate (!) 24, height 6\' 2"  (1.88 m), weight 79.4 kg (175 lb), SpO2 93 %.  PHYSICAL EXAMINATION:   Physical Exam  GENERAL:  76 y.o.-year-old patient lying in the bed with no acute distress.  EYES: Pupils equal, round, reactive to light and accommodation. No scleral icterus. Extraocular muscles intact.  HEENT: Head atraumatic, normocephalic. Oropharynx and nasopharynx  clear.  NECK:  Supple, no jugular venous distention. No thyroid enlargement, no tenderness.  LUNGS: Normal breath sounds bilaterally, scattered rhonchi posterior bases, no wheezing, rales or crepitation. No use of accessory muscles of respiration.  CARDIOVASCULAR: S1, S2 normal. No murmurs, rubs, or gallops.  ABDOMEN: Soft, nontender, nondistended. Bowel sounds present. No organomegaly or mass.  EXTREMITIES: No pedal edema, cyanosis, or clubbing.  NEUROLOGIC: Cranial nerves II through XII are intact. Muscle strength 5/5 in all extremities. Sensation intact. Gait not checked. Global weakness noted. PSYCHIATRIC: The patient is alert and oriented x 3. Sleepy now. SKIN: No obvious rash, lesion, or ulcer.   LABORATORY PANEL:   CBC  Recent Labs Lab 08/29/16 1908  WBC 10.6  HGB 15.9  HCT 47.0  PLT 338   ------------------------------------------------------------------------------------------------------------------  Chemistries   Recent Labs Lab 08/29/16 1908  NA 136  K 4.3  CL 100*  CO2 26  GLUCOSE 90  BUN 14    CREATININE 1.22  CALCIUM 9.2  AST 30  ALT 27  ALKPHOS 93  BILITOT 0.9   ------------------------------------------------------------------------------------------------------------------  Cardiac Enzymes  Recent Labs Lab 08/29/16 1909  TROPONINI 0.17*   ------------------------------------------------------------------------------------------------------------------  RADIOLOGY:  Dg Chest 2 View  Result Date: 08/29/2016 CLINICAL DATA:  Cough and fever.  Recent MI EXAM: CHEST  2 VIEW COMPARISON:  08/18/2016 FINDINGS: Normal heart size. No pleural effusions or edema. Patchy densities are identified within both lung bases compatible with multifocal infection. Upper lobes appear clear. IMPRESSION: 1. Bibasilar opacities compatible with pneumonia. Electronically Signed   By: Kerby Moors M.D.   On: 08/29/2016 19:21    EKG:   Orders placed or performed during the hospital encounter of 08/29/16  . EKG 12-Lead  . EKG 12-Lead    IMPRESSION AND PLAN:   [name]  is a 76 y.o. male with a known history of Asthma/COPD not on home oxygen, transbronchial lung biopsy revealing aspergillosis in 2016, hypertension, glaucoma, history of DVT on Xarelto which was changed to eliquis last week at discharge presents to the hospital secondary to confusion, feeling weak and having low-grade fevers.  #1 Multilobar pneumonia- will treat as HCAP due to recent hospitalization - Chest x-ray from last week to today shows new infiltrates. Has history of chronic bibasilar infiltrates for which she had bronchoscopy done and aspergillosis was discovered in 2016. He finished voriconazole at the time and is following up with [facility] pulmonology. -Continue oxygen support as needed. Blood cultures are pending. -Started on vancomycin and cefepime at this time. MRSA PCR is pending  #2 confusion-likely metabolic encephalopathy from underlying pneumonia. -No new focal deficits. Confusion is improving. Continue to  monitor at this time.  #3 history of DVT-was on Xarelto up until last admission, was discharged on eliquis. Will continue eliquis at this time.  #4 CAD-recent cardiac catheterization last week with 50% RCA disease. Continue medical management with aspirin, Imdur, statin  #5 glaucoma-continue eyedrops.  #6 DVT prophylaxis-patient on eliquis  Physical therapy consulted   All the records are reviewed and case discussed with ED provider. Management plans discussed with the patient, family and they are in agreement.  CODE STATUS: Full code  TOTAL TIME TAKING CARE OF THIS PATIENT: 50 minutes.    Gladstone Lighter M.D on 08/29/2016 at 8:56 PM  Between 7am to 6pm - Pager - [phone]  After 6pm go to [email or web address] - password [facility]  Office  [phone]  CC: Primary care physician; Wilhemena Durie, MD

## 2016-08-29 NOTE — ED Notes (Signed)
Pt was repositioned by request. Pt currently sitting on the side of the bed with family in room. RN made aware of this position. Pt informed to notify staff when ready to reposition

## 2016-08-29 NOTE — ED Triage Notes (Signed)
Pt in via POV with complaints of generalized weakness and sudden onset of confusion today around 1400.  Pt reports MI last week with cardiac cath, no stents placed.  Pt denies any chest pain at this time.  Pt febrile in triage, tachycardic, other vitals WDL.

## 2016-08-29 NOTE — ED Provider Notes (Signed)
[facility] Emergency Department Provider Note   ____________________________________________    I have reviewed the triage vital signs and the nursing notes.   HISTORY  Chief Complaint Altered Mental Status and Fever     HPI [name] is a 76 y.o. male who presents with complaints of weakness. Patient reports that today he began feeling quite weak all over with no energy. He reports a chronic cough over the last 2 years. He was recently admitted 1 week ago for heart attack. He has been feeling well since then. He denies myalgias or shortness of breath. No chest pain. No nausea or vomiting or abdominal pain. No dysuria. Wife reports intermittent mild confusion as well.   Past Medical History:  Diagnosis Date  . AB (asthmatic bronchitis)   . Allergic rhinitis   . Arthropathy   . Asthma   . Cellulitis   . DVT (deep venous thrombosis) ([place])   . Foreign body in skin   . Glaucoma   . Hx MRSA infection   . Hx MRSA infection   . Hypertension   . Insomnia   . Insomnia   . Mitral valve disorder   . Osteoarthrosis   . Upper back strain     Patient Active Problem List   Diagnosis Date Noted  . NSTEMI (non-ST elevated myocardial infarction) ([place]) 08/18/2016  . Chronic obstructive pulmonary disease ([place]) 01/02/2016  . DVT, femoral, acute ([place]) 05/15/2015  . Recurrent pneumonia 12/21/2014  . Pneumothorax, right 11/04/2014  . Basal cell carcinoma 10/19/2014  . Abnormal liver enzymes 10/19/2014  . Personal history of methicillin resistant Staphylococcus aureus 10/19/2014  . Cannot sleep 10/19/2014  . Heel pain 10/19/2014  . Pain in shoulder 10/19/2014  . Back strain 10/19/2014  . Pulmonary aspergillosis, allergic bronchopulmonary type ([place]) 07/30/2014  . ABPA (allergic bronchopulmonary aspergillosis) ([place]) 07/30/2014  . CAP (community acquired pneumonia) 05/03/2014  . Abnormal CXR 04/01/2014  . Paroxysmal tachycardia ([place]) 06/23/2013  .  Asymptomatic PVCs 06/23/2013  . Beat, premature ventricular 06/23/2013  . Cough 10/19/2012  . Allergic rhinitis 01/17/2009  . Asthma, chronic obstructive, without status asthmaticus ([place]) 11/04/2008  . CAFL (chronic airflow limitation) ([place]) 11/04/2008  . Essential hypertension 02/09/2008  . Arthropathia 08/28/2007  . Disorder of mitral valve 07/08/2005  . Benign prostatic hypertrophy without urinary obstruction 05/16/1997  . LBP (low back pain) 05/16/1997  . Degenerative arthritis of hip 03/18/1995    Past Surgical History:  Procedure Laterality Date  . APPENDECTOMY    . BRONCHOSCOPY    . LEFT HEART CATH AND CORONARY ANGIOGRAPHY N/A 08/19/2016   Procedure: Left Heart Cath and Coronary Angiography;  Surgeon: Dionisio David, MD;  Location: [place] [facility] [facility];  Service: Cardiovascular;  Laterality: N/A;  . NASAL SINUS SURGERY  09/27/2014    Prior to Admission medications   Medication Sig Start Date End Date Taking? Authorizing Provider  ADVAIR HFA 230-21 MCG/ACT inhaler Inhale 2 puffs into the lungs 2 (two) times daily.  11/17/15   Historical Provider, MD  aspirin EC 81 MG EC tablet Take 1 tablet (81 mg total) by mouth daily. 08/21/16   Loletha Grayer, MD  atorvastatin (LIPITOR) 40 MG tablet Take 1 tablet (40 mg total) by mouth daily at 6 PM. 08/27/16   Jerrol Banana., MD  azithromycin (ZITHROMAX) 250 MG tablet Take 250 mg by mouth daily.  12/23/15   Historical Provider, MD  brimonidine (ALPHAGAN) 0.15 % ophthalmic solution Place 1 drop into both  eyes 2 (two) times daily.  10/26/15   Historical Provider, MD  Calcium Carb-Cholecalciferol (CALCIUM 600 + D PO) Take 1 tablet by mouth 2 (two) times daily.    Historical Provider, MD  diltiazem (CARDIZEM CD) 180 MG 24 hr capsule Take 1 capsule (180 mg total) by mouth daily. 08/27/16   Richard Maceo Pro., MD  dorzolamide (TRUSOPT) 2 % ophthalmic solution Place 1 drop into both eyes 2 (two) times daily.  08/31/15   Historical Provider, MD    fluticasone (FLONASE) 50 MCG/ACT nasal spray 2 sprays by Each Nare route Two (2) times a day. 03/27/16 03/27/17  Historical Provider, MD  gabapentin (NEURONTIN) 400 MG capsule Take 400 mg by mouth daily.  09/18/15   Historical Provider, MD  isosorbide mononitrate (IMDUR) 30 MG 24 hr tablet Take 1 tablet (30 mg total) by mouth daily. 08/21/16   Loletha Grayer, MD  latanoprost (XALATAN) 0.005 % ophthalmic solution Place 1 drop into both eyes at bedtime.     Historical Provider, MD  Multiple Vitamin (MULTIVITAMIN) tablet Take 1 tablet by mouth daily.    Historical Provider, MD  rivaroxaban (XARELTO) 20 MG TABS tablet Take 1 tablet (20 mg total) by mouth daily with supper. 08/27/16   Richard Maceo Pro., MD  tiotropium (SPIRIVA HANDIHALER) 18 MCG inhalation capsule Place 18 mcg into inhaler and inhale daily.  02/06/15 08/18/17  Historical Provider, MD     Allergies Timolol maleate [timolol maleate]; Budesonide-formoterol fumarate; and Lisinopril  Family History  Problem Relation Age of Onset  . Breast cancer Sister   . Heart failure Mother   . Pancreatic cancer Sister   . Alzheimer's disease Father   . Brain cancer Daughter     Social History Social History  Substance Use Topics  . Smoking status: Former Smoker    Packs/day: 0.50    Years: 3.00    Types: Cigarettes    Quit date: 06/17/1958  . Smokeless tobacco: Never Used     Comment: smoking while in college  . Alcohol use No     Comment: OCCASSIONAL WINE ONLY    Review of Systems  Constitutional: Diffuse weakness Eyes: No visual changes.  ENT: No sore throat. Cardiovascular: Denies chest pain. Respiratory: Chronic cough Gastrointestinal: No abdominal pain.  No nausea, no vomiting.   Genitourinary: Negative for dysuria. Negative for Frequency Musculoskeletal: Negative for joint swelling Skin: Negative for rash. Neurological: Negative for headaches or weakness  10-point ROS otherwise  negative.  ____________________________________________   PHYSICAL EXAM:  VITAL SIGNS: ED Triage Vitals [08/29/16 1847]  Enc Vitals Group     BP (!) 124/57     Pulse Rate (!) 102     Resp 20     Temp 100.3 F (37.9 C)     Temp Source Oral     SpO2 90 %     Weight 175 lb (79.4 kg)     Height 6\' 2"  (1.88 m)     Head Circumference      Peak Flow      Pain Score      Pain Loc      Pain Edu?      Excl. in [place]?     Constitutional: Alert and oriented. No acute distress. Pleasant and interactive Eyes: Conjunctivae are normal.   Nose: No congestion/rhinnorhea. Mouth/Throat: Mucous membranes are moist.   Neck:  Painless ROM Cardiovascular: Tachycardia, regular rhythm. Grossly normal heart sounds.  Good peripheral circulation. Respiratory: Normal respiratory effort.  No retractions.  Lungs CTAB. Gastrointestinal: Soft and nontender. No distention.  No CVA tenderness. Genitourinary: deferred Musculoskeletal: No lower extremity tenderness nor edema.  Warm and well perfused Neurologic:  Normal speech and language. No gross focal neurologic deficits are appreciated. Cranial nerves II-12 are normal Skin:  Skin is warm, dry and intact. No rash noted. Psychiatric: Mood and affect are normal. Speech and behavior are normal.  ____________________________________________   LABS (all labs ordered are listed, but only abnormal results are displayed)  Labs Reviewed  COMPREHENSIVE METABOLIC PANEL - Abnormal; Notable for the following:       Result Value   Chloride 100 (*)    GFR calc non Af Amer 55 (*)    All other components within normal limits  CBC WITH DIFFERENTIAL/PLATELET - Abnormal; Notable for the following:    RDW 15.5 (*)    Neutro Abs 7.7 (*)    Monocytes Absolute 1.2 (*)    All other components within normal limits  TROPONIN I - Abnormal; Notable for the following:    Troponin I 0.17 (*)    All other components within normal limits  CULTURE, BLOOD (ROUTINE X 2)   CULTURE, BLOOD (ROUTINE X 2)  LACTIC ACID, PLASMA  LACTIC ACID, PLASMA  URINALYSIS, COMPLETE (UACMP) WITH MICROSCOPIC  INFLUENZA PANEL BY PCR (TYPE A & B)   ____________________________________________  EKG  ED ECG REPORT I, Lavonia Drafts, the attending physician, personally viewed and interpreted this ECG.  Date: 08/29/2016 Rate: 102 Rhythm: Sinus tachycardia QRS Axis: normal Intervals: normal ST/T Wave abnormalities: normal Conduction Disturbances: none   ____________________________________________  RADIOLOGY  Chest x-ray shows bibasilar opacities consistent with pneumonia ____________________________________________   PROCEDURES  Procedure(s) performed: No    Critical Care performed: No ____________________________________________   INITIAL IMPRESSION / ASSESSMENT AND PLAN / ED COURSE  Pertinent labs & imaging results that were available during my care of the patient were reviewed by me and considered in my medical decision making (see chart for details).  Patient p/w weakness that started today. Recent admission for nSTEMI. Mildly tachycardic with fever.    Chest x-ray consistent with pneumonia. We'll call a code sepsis and treat as a healthcare associated pneumonia.   Patient's troponin is 0.17 I suspect this is related to last week's nSTEMI he has no chest pain today  ____________________________________________   FINAL CLINICAL IMPRESSION(S) / ED DIAGNOSES  Final diagnoses:  HCAP (healthcare-associated pneumonia)      NEW MEDICATIONS STARTED DURING THIS VISIT:  New Prescriptions   No medications on file     Note:  This document was prepared using Dragon voice recognition software and may include unintentional dictation errors.    Lavonia Drafts, MD 08/29/16 2020

## 2016-08-29 NOTE — Progress Notes (Signed)
Pharmacy Antibiotic Note  [name] is a 76 y.o. male admitted on 08/29/2016 with  pneumonia/HCAP .  Pharmacy has been consulted for vancomycin and cefepime dosing. Patient received vancomycin 1gm IV and cefepime 2gm IV x 1 dose in ED.   Plan: Ke: 0.052   VD: 55   T1/2: 13  Will start patient on vancomycin 1250mg  IV every 18 hours with 6 hour stack dosing. Calculated trough at Css is 17. Trough level ordered prior to 4th dose. Will monitor renal function daily and adjust dose as needed. Recommend MRSA PCR- and if negative recommend D/C of antibiotics.   Will start Cefepime 2gm IV every 12 hours.   Height: 6\' 2"  (188 cm) Weight: 175 lb (79.4 kg) IBW/kg (Calculated) : 82.2  Temp (24hrs), Avg:100.3 F (37.9 C), Min:100.3 F (37.9 C), Max:100.3 F (37.9 C)   Recent Labs Lab 08/29/16 1908  WBC 10.6  CREATININE 1.22  LATICACIDVEN 1.8    Estimated Creatinine Clearance: 56.9 mL/min (by C-G formula based on SCr of 1.22 mg/dL).    Allergies  Allergen Reactions  . Timolol Maleate [Timolol Maleate] Shortness Of Breath  . Budesonide-Formoterol Fumarate Other (See Comments)    Patient reports dizziness and trembling/shaky feeling.  . Lisinopril     cough    Antimicrobials this admission: 3/15 vancomycin  >>  3/15 cefepime  >>   Dose adjustments this admission:  Microbiology results: 3/15  BCx:  3/15 MRSA PCR:  Thank you for allowing pharmacy to be a part of this patient's care.  Pernell Dupre, PharmD, BCPS Clinical Pharmacist 08/29/2016 8:28 PM

## 2016-08-30 LAB — COMPREHENSIVE METABOLIC PANEL
A/G RATIO: 1.5 (ref 1.2–2.2)
ALBUMIN: 3.8 g/dL (ref 3.5–4.8)
ALT: 29 IU/L (ref 0–44)
AST: 18 IU/L (ref 0–40)
Alkaline Phosphatase: 106 IU/L (ref 39–117)
BILIRUBIN TOTAL: 0.6 mg/dL (ref 0.0–1.2)
BUN / CREAT RATIO: 7 — AB (ref 10–24)
BUN: 8 mg/dL (ref 8–27)
CHLORIDE: 100 mmol/L (ref 96–106)
CO2: 27 mmol/L (ref 18–29)
Calcium: 9.3 mg/dL (ref 8.6–10.2)
Creatinine, Ser: 1.11 mg/dL (ref 0.76–1.27)
GFR calc non Af Amer: 64 mL/min/{1.73_m2} (ref >59)
GFR, EST AFRICAN AMERICAN: 74 mL/min/{1.73_m2} (ref >59)
GLOBULIN, TOTAL: 2.5 g/dL (ref 1.5–4.5)
Glucose: 85 mg/dL (ref 65–99)
POTASSIUM: 4.5 mmol/L (ref 3.5–5.2)
SODIUM: 141 mmol/L (ref 134–144)
TOTAL PROTEIN: 6.3 g/dL (ref 6.0–8.5)

## 2016-08-30 LAB — CBC
HEMATOCRIT: 40.2 % (ref 40.0–52.0)
HEMOGLOBIN: 13.6 g/dL (ref 13.0–18.0)
MCH: 33.1 pg (ref 26.0–34.0)
MCHC: 33.8 g/dL (ref 32.0–36.0)
MCV: 98 fL (ref 80.0–100.0)
Platelets: 283 10*3/uL (ref 150–440)
RBC: 4.1 MIL/uL — ABNORMAL LOW (ref 4.40–5.90)
RDW: 15.3 % — ABNORMAL HIGH (ref 11.5–14.5)
WBC: 6.9 10*3/uL (ref 3.8–10.6)

## 2016-08-30 LAB — BASIC METABOLIC PANEL
ANION GAP: 4 — AB (ref 5–15)
BUN: 12 mg/dL (ref 6–20)
CO2: 27 mmol/L (ref 22–32)
Calcium: 8.1 mg/dL — ABNORMAL LOW (ref 8.9–10.3)
Chloride: 107 mmol/L (ref 101–111)
Creatinine, Ser: 1.24 mg/dL (ref 0.61–1.24)
GFR calc Af Amer: >60 mL/min (ref >60)
GFR calc non Af Amer: 54 mL/min — ABNORMAL LOW (ref >60)
GLUCOSE: 91 mg/dL (ref 65–99)
POTASSIUM: 3.9 mmol/L (ref 3.5–5.1)
Sodium: 138 mmol/L (ref 135–145)

## 2016-08-30 LAB — LIPID PANEL WITH LDL/HDL RATIO
Cholesterol, Total: 102 mg/dL (ref 100–199)
HDL: 55 mg/dL (ref >39)
LDL Calculated: 38 mg/dL (ref 0–99)
LDL/HDL RATIO: 0.7 ratio (ref 0.0–3.6)
TRIGLYCERIDES: 44 mg/dL (ref 0–149)
VLDL Cholesterol Cal: 9 mg/dL (ref 5–40)

## 2016-08-30 LAB — MRSA PCR SCREENING: MRSA by PCR: NEGATIVE

## 2016-08-30 MED ORDER — ASPIRIN EC 81 MG PO TBEC
81.0000 mg | DELAYED_RELEASE_TABLET | Freq: Every day | ORAL | Status: DC
  Administered 2016-08-30 – 2016-08-31 (×2): 81 mg via ORAL
  Filled 2016-08-30 (×2): qty 1

## 2016-08-30 MED ORDER — ATORVASTATIN CALCIUM 20 MG PO TABS
40.0000 mg | ORAL_TABLET | ORAL | Status: DC
  Administered 2016-08-30: 21:00:00 40 mg via ORAL
  Filled 2016-08-30: qty 2

## 2016-08-30 MED ORDER — DILTIAZEM HCL ER COATED BEADS 180 MG PO CP24
180.0000 mg | ORAL_CAPSULE | Freq: Every day | ORAL | Status: DC
  Administered 2016-08-30 – 2016-08-31 (×2): 180 mg via ORAL
  Filled 2016-08-30 (×2): qty 1

## 2016-08-30 MED ORDER — ADULT MULTIVITAMIN W/MINERALS CH
1.0000 | ORAL_TABLET | Freq: Every day | ORAL | Status: DC
  Administered 2016-08-30 – 2016-08-31 (×2): 1 via ORAL
  Filled 2016-08-30 (×2): qty 1

## 2016-08-30 MED ORDER — ISOSORBIDE MONONITRATE ER 30 MG PO TB24
30.0000 mg | ORAL_TABLET | Freq: Every day | ORAL | Status: DC
  Administered 2016-08-30 – 2016-08-31 (×2): 30 mg via ORAL
  Filled 2016-08-30 (×2): qty 1

## 2016-08-30 MED ORDER — BENZONATATE 100 MG PO CAPS
100.0000 mg | ORAL_CAPSULE | ORAL | Status: DC
  Administered 2016-08-30 – 2016-08-31 (×4): 100 mg via ORAL
  Filled 2016-08-30 (×4): qty 1

## 2016-08-30 MED ORDER — APIXABAN 5 MG PO TABS
5.0000 mg | ORAL_TABLET | Freq: Two times a day (BID) | ORAL | Status: DC
  Administered 2016-08-30 – 2016-08-31 (×3): 5 mg via ORAL
  Filled 2016-08-30 (×3): qty 1

## 2016-08-30 MED ORDER — LATANOPROST 0.005 % OP SOLN
1.0000 [drp] | OPHTHALMIC | Status: DC
  Administered 2016-08-30: 1 [drp] via OPHTHALMIC
  Filled 2016-08-30 (×6): qty 2.5

## 2016-08-30 MED ORDER — TIOTROPIUM BROMIDE MONOHYDRATE 18 MCG IN CAPS
18.0000 ug | ORAL_CAPSULE | RESPIRATORY_TRACT | Status: DC
  Administered 2016-08-30 – 2016-08-31 (×2): 18 ug via RESPIRATORY_TRACT
  Filled 2016-08-30: qty 5

## 2016-08-30 MED ORDER — MOMETASONE FURO-FORMOTEROL FUM 200-5 MCG/ACT IN AERO
2.0000 | INHALATION_SPRAY | Freq: Two times a day (BID) | RESPIRATORY_TRACT | Status: DC
  Administered 2016-08-30 – 2016-08-31 (×3): 2 via RESPIRATORY_TRACT
  Filled 2016-08-30: qty 8.8

## 2016-08-30 MED ORDER — GABAPENTIN 400 MG PO CAPS
400.0000 mg | ORAL_CAPSULE | ORAL | Status: DC
  Administered 2016-08-30: 400 mg via ORAL
  Filled 2016-08-30: qty 1

## 2016-08-30 MED ORDER — DORZOLAMIDE HCL 2 % OP SOLN
1.0000 [drp] | Freq: Two times a day (BID) | OPHTHALMIC | Status: DC
Start: 2016-08-30 — End: 2016-08-31
  Administered 2016-08-30 – 2016-08-31 (×3): 1 [drp] via OPHTHALMIC
  Filled 2016-08-30: qty 10

## 2016-08-30 MED ORDER — FLUTICASONE PROPIONATE 50 MCG/ACT NA SUSP
2.0000 | Freq: Every day | NASAL | Status: DC
  Administered 2016-08-30 – 2016-08-31 (×2): 2 via NASAL
  Filled 2016-08-30: qty 16

## 2016-08-30 MED ORDER — BRIMONIDINE TARTRATE 0.15 % OP SOLN
1.0000 [drp] | Freq: Two times a day (BID) | OPHTHALMIC | Status: DC
  Administered 2016-08-30 – 2016-08-31 (×3): 1 [drp] via OPHTHALMIC
  Filled 2016-08-30: qty 5

## 2016-08-30 NOTE — Progress Notes (Signed)
Patient ID: [name], male   DOB: 07-15-1940, 76 y.o.   MRN: [id number]  [facility] PROGRESS NOTE  NIHAAL FRIESEN LXB:[id number] DOB: Mar 01, 1941 DOA: 08/29/2016 PCP: Wilhemena Durie, MD  HPI/Subjective: Patient came in with altered mental status and weakness and found to have bilateral pneumonia. Patient feeling better today. Not much cough. Always has shortness of breath.  Objective: Vitals:   08/30/16 1037 08/30/16 1231  BP:  (!) 120/55  Pulse: 80 79  Resp:  16  Temp:  98.6 F (37 C)    Filed Weights   08/29/16 1847 08/29/16 2222  Weight: 79.4 kg (175 lb) 77.7 kg (171 lb 6.4 oz)    ROS: Review of Systems  Constitutional: Negative for chills and fever.  Eyes: Negative for blurred vision.  Respiratory: Positive for cough and shortness of breath.   Cardiovascular: Negative for chest pain.  Gastrointestinal: Negative for abdominal pain, constipation, diarrhea, nausea and vomiting.  Genitourinary: Negative for dysuria.  Musculoskeletal: Negative for joint pain.  Neurological: Negative for dizziness and headaches.   Exam: Physical Exam  Constitutional: He is oriented to person, place, and time.  HENT:  Nose: No mucosal edema.  Mouth/Throat: No oropharyngeal exudate or posterior oropharyngeal edema.  Eyes: Conjunctivae, EOM and lids are normal. Pupils are equal, round, and reactive to light.  Neck: No JVD present. Carotid bruit is not present. No edema present. No thyroid mass and no thyromegaly present.  Cardiovascular: S1 normal and S2 normal.  Exam reveals no gallop.   No murmur heard. Pulses:      Dorsalis pedis pulses are 2+ on the right side, and 2+ on the left side.  Respiratory: No respiratory distress. He has no wheezes. He has rhonchi in the right lower field and the left lower field. He has no rales.  GI: Soft. Bowel sounds are normal. There is no tenderness.  Musculoskeletal:       Right ankle: He exhibits no swelling.       Left ankle: He exhibits no  swelling.  Lymphadenopathy:    He has no cervical adenopathy.  Neurological: He is alert and oriented to person, place, and time. No cranial nerve deficit.  Skin: Skin is warm. No rash noted. Nails show no clubbing.  Psychiatric: He has a normal mood and affect.      Data Reviewed: Basic Metabolic Panel:  Recent Labs Lab 08/29/16 0814 08/29/16 1908 08/30/16 0517  NA 141 136 138  K 4.5 4.3 3.9  CL 100 100* 107  CO2 27 26 27   GLUCOSE 85 90 91  BUN 8 14 12   CREATININE 1.11 1.22 1.24  CALCIUM 9.3 9.2 8.1*   Liver Function Tests:  Recent Labs Lab 08/29/16 0814 08/29/16 1908  AST 18 30  ALT 29 27  ALKPHOS 106 93  BILITOT 0.6 0.9  PROT 6.3 7.4  ALBUMIN 3.8 3.9   CBC:  Recent Labs Lab 08/29/16 1908 08/30/16 0517  WBC 10.6 6.9  NEUTROABS 7.7*  --   HGB 15.9 13.6  HCT 47.0 40.2  MCV 96.9 98.0  PLT 338 283   Cardiac Enzymes:  Recent Labs Lab 08/29/16 1909  TROPONINI 0.17*     Recent Results (from the past 240 hour(s))  Blood culture (routine x 2)     Status: None (Preliminary result)   Collection Time: 08/29/16  8:04 PM  Result Value Ref Range Status   Specimen Description BLOOD  L AC  Final   Special Requests BOTTLES DRAWN AEROBIC  AND ANAEROBIC  BCAV  Final   Culture NO GROWTH < 12 HOURS  Final   Report Status PENDING  Incomplete  Blood culture (routine x 2)     Status: None (Preliminary result)   Collection Time: 08/29/16  8:04 PM  Result Value Ref Range Status   Specimen Description BLOOD  L FOREARM  Final   Special Requests BOTTLES DRAWN AEROBIC AND ANAEROBIC BCAV  Final   Culture NO GROWTH < 12 HOURS  Final   Report Status PENDING  Incomplete     Studies: Dg Chest 2 View  Result Date: 08/29/2016 CLINICAL DATA:  Cough and fever.  Recent MI EXAM: CHEST  2 VIEW COMPARISON:  08/18/2016 FINDINGS: Normal heart size. No pleural effusions or edema. Patchy densities are identified within both lung bases compatible with multifocal infection. Upper  lobes appear clear. IMPRESSION: 1. Bibasilar opacities compatible with pneumonia. Electronically Signed   By: Kerby Moors M.D.   On: 08/29/2016 19:21    Scheduled Meds: . apixaban  5 mg Oral Q12H  . aspirin EC  81 mg Oral QPC breakfast  . atorvastatin  40 mg Oral Daily  . benzonatate  100 mg Oral 3 times per day  . brimonidine  1 drop Both Eyes Q12H  . ceFEPIme (MAXIPIME) 2 GM IVP  2 g Intravenous Q12H  . diltiazem  180 mg Oral QPC breakfast  . dorzolamide  1 drop Both Eyes Q12H  . fluticasone  2 spray Each Nare QPC breakfast  . gabapentin  400 mg Oral Q24H  . isosorbide mononitrate  30 mg Oral QPC breakfast  . latanoprost  1 drop Both Eyes Q24H  . mometasone-formoterol  2 puff Inhalation Q12H  . multivitamin with minerals  1 tablet Oral QPC breakfast  . tiotropium  18 mcg Inhalation Q24H  . vancomycin  1,250 mg Intravenous Q18H   Continuous Infusions: . sodium chloride 60 mL/hr at 08/29/16 2340    Assessment/Plan:  1. Multi lobar pneumonia, with recent hospitalization will treat as healthcare associated pneumonia. Patient on vancomycin and Maxipime. If MRSA PCR is negative vancomycin can be discontinued. Potentially home tomorrow on Augmentin 2. Acute encephalopathy secondary to underlying pneumonia. 3. History of DVT. On Eliquis. 4. Coronary artery disease with recent myocardial infarction. Cardiac catheterization showing 50% RCA disease. Medical management with aspirin, Imdur, statin. Beta blocker contraindicated secondary to allergy 5. History of SVT and tachycardia on Cardizem CD 6. Glaucoma continue eyedrops 7. History of COPD 8. Hyperlipidemia unspecified on atorvastatin  Code Status:     Code Status Orders        Start     Ordered   08/29/16 2255  Full code  Continuous     08/29/16 2254    Code Status History    Date Active Date Inactive Code Status Order ID Comments User Context   08/18/2016  4:34 PM 08/20/2016  4:11 PM Full Code [id number]  Vaughan Basta, MD Inpatient   11/04/2014  7:09 AM 11/09/2014  2:50 PM Full Code [id number]  Molly Maduro, MD ED    Advance Directive Documentation     Most Recent Value  Type of Advance Directive  Healthcare Power of Attorney, Living will  Pre-existing out of facility DNR order (yellow form or pink MOST form)  -  "MOST" Form in Place?  -     Family Communication: Wife at bedside Disposition Plan: Potentially home tomorrow  Antibiotics:  Vancomycin  Maxipime  Time spent: 28 minutes  Loletha Grayer  [facility]

## 2016-08-30 NOTE — Progress Notes (Signed)
Pharmacy Antibiotic Note  [name] is a 76 y.o. male admitted on 08/29/2016 with  pneumonia/HCAP .  Pharmacy has been consulted for vancomycin and cefepime dosing.   Plan: Continue vancomycin 1250 mg IV q18h for now Goal vancomycin trough 15-20 mcg/mL Vancomycin trough ordered for 3/18 @ 0930  Per discussion with MD - if MRSA PCR is negative, discontinue vancomycin.  Continue Cefepime 2gm IV every 12 hours.   Height: 6\' 2"  (188 cm) Weight: 171 lb 6.4 oz (77.7 kg) IBW/kg (Calculated) : 82.2  Temp (24hrs), Avg:99.2 F (37.3 C), Min:98.3 F (36.8 C), Max:100.3 F (37.9 C)   Recent Labs Lab 08/29/16 0814 08/29/16 1908 08/30/16 0517  WBC  --  10.6 6.9  CREATININE 1.11 1.22 1.24  LATICACIDVEN  --  1.8  --     Estimated Creatinine Clearance: 54.8 mL/min (by C-G formula based on SCr of 1.24 mg/dL).    Allergies  Allergen Reactions  . Timolol Maleate [Timolol Maleate] Shortness Of Breath  . Bactrim [Sulfamethoxazole-Trimethoprim]     Pt says "it takes his skin pigment off  . Budesonide-Formoterol Fumarate Other (See Comments)    Patient reports dizziness and trembling/shaky feeling.  . Lisinopril     cough    Antimicrobials this admission: 3/15 vancomycin  >>  3/15 cefepime  >>   Dose adjustments this admission:  Microbiology results: 3/15  BCx: No growth to date 3/16 MRSA PCR: Pending  Thank you for allowing pharmacy to be a part of this patient's care.  Lenis Noon, PharmD, BCPS Clinical Pharmacist 08/30/2016 2:52 PM

## 2016-08-30 NOTE — Evaluation (Signed)
Physical Therapy Evaluation Patient Details Name: [name] MRN: [id number] DOB: 11-03-1940 Today's Date: 08/30/2016   History of Present Illness  Pt is a 76 y.o.malewith a known history of Asthma/COPD not on home oxygen, transbronchial lung biopsy revealing aspergillosis in 2016, hypertension, glaucoma, history of DVT on Xarelto which was changed to eliquis last week at discharge presents to the hospital secondary to confusion, feeling weak and having low-grade fevers.  Patient was discharged last week after he was treated with NSTEMI, cardiac catheterization revealing 50% RCA occlusion which was treated medically. Lower extremity Doppler showing chronic clot in the lower leg and his anticoagulation was changed to eliquis at discharge. Patient says she has been feeling fine up until this afternoon. He felt chills and was very weak, had a low-grade fever of 100.47F. According to his wife, he appeared more confused and couldn't get his words right. He has chronic cough and dyspnea from his COPD and lung disease. No change in his cough or shortness of breath noted by family. No nausea or vomiting. No recent travel. His only exposure to sick contacts was being in the hospital last week. His chest x-ray from last admission was clear but x-ray from today showing bilateral upper lobe infiltrates, possibly pneumonia.    Clinical Impression  Pt presents with only mild deficits in activity tolerance but reports feeling close to baseline.  Pt was Ind with bed mobility, transfers, and amb > 200' without AD.  Pt able to go up/down 3 steps, limited only by length of IV line, safely and confidently without LOB or SOB.  Pt presented with good stability during balance assessment including able to stand in SLS > 10 sec without LOB.  SpO2 on room air remainied 92-93% throughout session with HR in the low to high 80s.  Will complete PT orders at this time secondary to pt being Ind with all functional mobility tasks at  this time with anticipation that slight deficits in activity tolerance will resolve as medical condition improves.  Will reassess pt upon a change in status with receipt of new PT orders.    Follow Up Recommendations No PT follow up    Equipment Recommendations  None recommended by PT    Recommendations for Other Services       Precautions / Restrictions Precautions Precautions: None Restrictions Weight Bearing Restrictions: No      Mobility  Bed Mobility Overal bed mobility: Independent                Transfers Overall transfer level: Independent                  Ambulation/Gait Ambulation/Gait assistance: Independent Ambulation Distance (Feet): 200 Feet Assistive device: None Gait Pattern/deviations: WFL(Within Functional Limits)   Gait velocity interpretation: Below normal speed for age/gender General Gait Details: Pt Ind and steady with Amb 200+ feet without AD with slightly decreased cadence   Stairs Stairs: Yes Stairs assistance: Independent Stair Management: One rail Right Number of Stairs: 3 General stair comments: Pt steady and confident with steps with number of steps attempted limited only by length of IV line  Wheelchair Mobility    Modified Rankin (Stroke Patients Only)       Balance Overall balance assessment: Independent (Pt steady in true tandem stance and able to remain in SLS > 10 sec without LOB)  Pertinent Vitals/Pain Pain Assessment: No/denies pain    Home Living Family/patient expects to be discharged to:: Private residence Living Arrangements: Spouse/significant other Available Help at Discharge: Family;Available 24 hours/day Type of Home: House Home Access: Stairs to enter Entrance Stairs-Rails: None Entrance Stairs-Number of Steps: 1 Home Layout: Two level Home Equipment: None      Prior Function Level of Independence: Independent          Comments: Pt Ind with amb community distances without AD, no fall history, Ind with ADLs     Hand Dominance   Dominant Hand: Right    Extremity/Trunk Assessment   Upper Extremity Assessment Upper Extremity Assessment: Overall WFL for tasks assessed    Lower Extremity Assessment Lower Extremity Assessment: Overall WFL for tasks assessed       Communication   Communication: No difficulties  Cognition Arousal/Alertness: Awake/alert Behavior During Therapy: WFL for tasks assessed/performed Overall Cognitive Status: Within Functional Limits for tasks assessed                      General Comments      Exercises     Assessment/Plan    PT Assessment Patent does not need any further PT services  PT Problem List         PT Treatment Interventions      PT Goals (Current goals can be found in the Care Plan section)  Acute Rehab PT Goals Patient Stated Goal: To get back home PT Goal Formulation: With patient Time For Goal Achievement: 09/12/16 Potential to Achieve Goals: Good    Frequency     Barriers to discharge        Co-evaluation               End of Session Equipment Utilized During Treatment: Gait belt Activity Tolerance: Patient tolerated treatment well Patient left: in bed;with call bell/phone within reach;with family/visitor present             Time: 9242-6834 PT Time Calculation (min) (ACUTE ONLY): 30 min   Charges:   PT Evaluation $PT Eval Low Complexity: 1 Procedure     PT G Codes:         DRoyetta Asal PT, DPT 08/30/16, 10:48 AM

## 2016-08-30 NOTE — Progress Notes (Signed)
Repeatedly tried to call Dr Anice Paganini ofc to get result of CT scan no answer

## 2016-08-31 LAB — BASIC METABOLIC PANEL
Anion gap: 8 (ref 5–15)
BUN: 13 mg/dL (ref 6–20)
CHLORIDE: 105 mmol/L (ref 101–111)
CO2: 24 mmol/L (ref 22–32)
CREATININE: 1.22 mg/dL (ref 0.61–1.24)
Calcium: 8.4 mg/dL — ABNORMAL LOW (ref 8.9–10.3)
GFR calc Af Amer: >60 mL/min (ref >60)
GFR calc non Af Amer: 55 mL/min — ABNORMAL LOW (ref >60)
GLUCOSE: 83 mg/dL (ref 65–99)
Potassium: 3.6 mmol/L (ref 3.5–5.1)
Sodium: 137 mmol/L (ref 135–145)

## 2016-08-31 MED ORDER — SODIUM CHLORIDE 0.9% FLUSH
3.0000 mL | Freq: Two times a day (BID) | INTRAVENOUS | Status: DC
  Administered 2016-08-31: 3 mL via INTRAVENOUS

## 2016-08-31 MED ORDER — AMOXICILLIN-POT CLAVULANATE 875-125 MG PO TABS: 1.0000 | ORAL_TABLET | Freq: Two times a day (BID) | ORAL | 0 refills | Status: DC

## 2016-08-31 MED ORDER — SODIUM CHLORIDE 0.9% FLUSH: 3.0000 mL | INTRAVENOUS | Status: DC | PRN

## 2016-08-31 NOTE — Discharge Summary (Signed)
[facility] NAME: [name]    MR#:  [id number]  DATE OF BIRTH:  Sep 18, 1940  DATE OF ADMISSION:  08/29/2016 ADMITTING PHYSICIAN: Gladstone Lighter, MD  DATE OF DISCHARGE: 08/31/2016  8:59 AM  PRIMARY CARE PHYSICIAN: Wilhemena Durie, MD    ADMISSION DIAGNOSIS:  HCAP (healthcare-associated pneumonia) [J18.9]  DISCHARGE DIAGNOSIS:  Active Problems:   HCAP (healthcare-associated pneumonia)   SECONDARY DIAGNOSIS:   Past Medical History:  Diagnosis Date  . AB (asthmatic bronchitis)   . Allergic rhinitis   . Arthropathy   . Asthma   . Cellulitis   . DVT (deep venous thrombosis) ([place])   . Foreign body in skin   . Glaucoma   . Hx MRSA infection   . Hx MRSA infection   . Hypertension   . Insomnia   . Insomnia   . Mitral valve disorder   . Osteoarthrosis   . Upper back strain     HOSPITAL COURSE:   1. Multi lobar pneumonia with recent hospitalization will be treated as healthcare associated pneumonia. The patient was initially on vancomycin and Maxipime. MRSA PCR was negative and vancomycin was discontinued. The patient was doing well and sent home on Augmentin. 2. Acute encephalopathy secondary to underlying pneumonia. This has improved. Mental status back to baseline. 3. History of DVT. On eliquis. 4. Coronary artery disease with recent myocardial infarction. Cardiac catheterization showing 50% RCA disease. Medical management with aspirin, Imdur, statin. Beta blocker contraindicated secondary to allergy. 5. History of SVT and tachycardia on Cardizem CD 6. Glaucoma continue eyedrops 7. History of COPD. Continue usual inhalers. 8. Hyperlipidemia unspecified on atorvastatin 9. Chronic lung infection on chronic Zithromax.   DISCHARGE CONDITIONS:   Satisfactory  CONSULTS OBTAINED:  None  DRUG ALLERGIES:   Allergies  Allergen Reactions  . Timolol Maleate [Timolol Maleate] Shortness Of Breath  . Bactrim  [Sulfamethoxazole-Trimethoprim]     Pt says "it takes his skin pigment off  . Budesonide-Formoterol Fumarate Other (See Comments)    Patient reports dizziness and trembling/shaky feeling.  . Lisinopril     cough    DISCHARGE MEDICATIONS:   Discharge Medication List as of 08/31/2016  8:03 AM    START taking these medications   Details  amoxicillin-clavulanate (AUGMENTIN) 875-125 MG tablet Take 1 tablet by mouth 2 (two) times daily., Starting Sat 08/31/2016, Print      CONTINUE these medications which have NOT CHANGED   Details  ADVAIR HFA 230-21 MCG/ACT inhaler Inhale 2 puffs into the lungs 2 (two) times daily. , Starting Fri 11/17/2015, Historical Med    apixaban (ELIQUIS) 5 MG TABS tablet Take 5 mg by mouth 2 (two) times daily., Historical Med    aspirin EC 81 MG EC tablet Take 1 tablet (81 mg total) by mouth daily., Starting Wed 08/21/2016, Print    atorvastatin (LIPITOR) 40 MG tablet Take 1 tablet (40 mg total) by mouth daily at 6 PM., Starting Tue 08/27/2016, Normal    azithromycin (ZITHROMAX) 250 MG tablet Take 250 mg by mouth daily., Historical Med    brimonidine (ALPHAGAN) 0.15 % ophthalmic solution Place 1 drop into both eyes 2 (two) times daily. , Starting Thu 10/26/2015, Historical Med    Calcium Carb-Cholecalciferol (CALCIUM 600 + D PO) Take 1 tablet by mouth 2 (two) times daily., Historical Med    diclofenac sodium (VOLTAREN) 1 % GEL Apply 2 g topically 4 (four) times daily., Historical Med    diltiazem (CARDIZEM CD)  180 MG 24 hr capsule Take 1 capsule (180 mg total) by mouth daily., Starting Tue 08/27/2016, Normal    dorzolamide (TRUSOPT) 2 % ophthalmic solution Place 1 drop into both eyes 2 (two) times daily. , Starting Thu 08/31/2015, Historical Med    fluticasone (FLONASE) 50 MCG/ACT nasal spray 2 sprays by Each Nare route Two (2) times a day., Historical Med    gabapentin (NEURONTIN) 400 MG capsule Take 400 mg by mouth daily. , Starting Mon 09/18/2015, Historical Med     isosorbide mononitrate (IMDUR) 30 MG 24 hr tablet Take 1 tablet (30 mg total) by mouth daily., Starting Wed 08/21/2016, Print    latanoprost (XALATAN) 0.005 % ophthalmic solution Place 1 drop into both eyes at bedtime. , Historical Med    Multiple Vitamin (MULTIVITAMIN) tablet Take 1 tablet by mouth daily., Historical Med    tiotropium (SPIRIVA HANDIHALER) 18 MCG inhalation capsule Place 18 mcg into inhaler and inhale daily. , Starting Mon 02/06/2015, Until Mon 08/18/2017, Historical Med      STOP taking these medications     rivaroxaban (XARELTO) 20 MG TABS tablet          DISCHARGE INSTRUCTIONS:   Follow-up with PMD one week follow up with your lung doctor 2 weeks  If you experience worsening of your admission symptoms, develop shortness of breath, life threatening emergency, suicidal or homicidal thoughts you must seek medical attention immediately by calling 911 or calling your MD immediately  if symptoms less severe.  You Must read complete instructions/literature along with all the possible adverse reactions/side effects for all the Medicines you take and that have been prescribed to you. Take any new Medicines after you have completely understood and accept all the possible adverse reactions/side effects.   Please note  You were cared for by a hospitalist during your hospital stay. If you have any questions about your discharge medications or the care you received while you were in the hospital after you are discharged, you can call the unit and asked to speak with the hospitalist on call if the hospitalist that took care of you is not available. Once you are discharged, your primary care physician will handle any further medical issues. Please note that NO REFILLS for any discharge medications will be authorized once you are discharged, as it is imperative that you return to your primary care physician (or establish a relationship with a primary care physician if you do not have one)  for your aftercare needs so that they can reassess your need for medications and monitor your lab values.    Today   CHIEF COMPLAINT:   Chief Complaint  Patient presents with  . Altered Mental Status  . Fever    HISTORY OF PRESENT ILLNESS:  [name]  is a 76 y.o. male Presented with altered mental status and fever and found to have multi lobar pneumonia   VITAL SIGNS:  Blood pressure 131/75, pulse 77, temperature 99.5 F (37.5 C), temperature source Oral, resp. rate 20, height 6\' 2"  (1.88 m), weight 77.7 kg (171 lb 6.4 oz), SpO2 93 %.    PHYSICAL EXAMINATION:  GENERAL:  76 y.o.-year-old patient lying in the bed with no acute distress.  EYES: Pupils equal, round, reactive to light and accommodation. No scleral icterus. Extraocular muscles intact.  HEENT: Head atraumatic, normocephalic. Oropharynx and nasopharynx clear.  NECK:  Supple, no jugular venous distention. No thyroid enlargement, no tenderness.  LUNGS: Normal breath sounds bilaterally, no wheezing, rales,rhonchi or crepitation. No  use of accessory muscles of respiration.  CARDIOVASCULAR: S1, S2 normal. No murmurs, rubs, or gallops.  ABDOMEN: Soft, non-tender, non-distended. Bowel sounds present. No organomegaly or mass.  EXTREMITIES: No pedal edema, cyanosis, or clubbing.  NEUROLOGIC: Cranial nerves II through XII are intact. Muscle strength 5/5 in all extremities. Sensation intact. Gait not checked.  PSYCHIATRIC: The patient is alert and oriented x 3.  SKIN: No obvious rash, lesion, or ulcer.   DATA REVIEW:   CBC  Recent Labs Lab 08/30/16 0517  WBC 6.9  HGB 13.6  HCT 40.2  PLT 283    Chemistries   Recent Labs Lab 08/29/16 1908  08/31/16 0458  NA 136  < > 137  K 4.3  < > 3.6  CL 100*  < > 105  CO2 26  < > 24  GLUCOSE 90  < > 83  BUN 14  < > 13  CREATININE 1.22  < > 1.22  CALCIUM 9.2  < > 8.4*  AST 30  --   --   ALT 27  --   --   ALKPHOS 93  --   --   BILITOT 0.9  --   --   < > = values in  this interval not displayed.  Cardiac Enzymes  Recent Labs Lab 08/29/16 1909  TROPONINI 0.17*    Microbiology Results  Results for orders placed or performed during the hospital encounter of 08/29/16  Blood culture (routine x 2)     Status: None (Preliminary result)   Collection Time: 08/29/16  8:04 PM  Result Value Ref Range Status   Specimen Description BLOOD  L AC  Final   Special Requests BOTTLES DRAWN AEROBIC AND ANAEROBIC  BCAV  Final   Culture NO GROWTH 2 DAYS  Final   Report Status PENDING  Incomplete  Blood culture (routine x 2)     Status: None (Preliminary result)   Collection Time: 08/29/16  8:04 PM  Result Value Ref Range Status   Specimen Description BLOOD  L FOREARM  Final   Special Requests BOTTLES DRAWN AEROBIC AND ANAEROBIC BCAV  Final   Culture NO GROWTH 2 DAYS  Final   Report Status PENDING  Incomplete  MRSA PCR Screening     Status: None   Collection Time: 08/30/16  4:24 PM  Result Value Ref Range Status   MRSA by PCR NEGATIVE NEGATIVE Final    Comment:        The GeneXpert MRSA Assay (FDA approved for NASAL specimens only), is one component of a comprehensive MRSA colonization surveillance program. It is not intended to diagnose MRSA infection nor to guide or monitor treatment for MRSA infections.     RADIOLOGY:  Dg Chest 2 View  Result Date: 08/29/2016 CLINICAL DATA:  Cough and fever.  Recent MI EXAM: CHEST  2 VIEW COMPARISON:  08/18/2016 FINDINGS: Normal heart size. No pleural effusions or edema. Patchy densities are identified within both lung bases compatible with multifocal infection. Upper lobes appear clear. IMPRESSION: 1. Bibasilar opacities compatible with pneumonia. Electronically Signed   By: Kerby Moors M.D.   On: 08/29/2016 19:21    Management plans discussed with the patient, family and they are in agreement.  CODE STATUS:  Code Status History    Date Active Date Inactive Code Status Order ID Comments User Context    08/29/2016 10:54 PM 08/31/2016 12:04 PM Full Code [id number]  Gladstone Lighter, MD Inpatient   08/18/2016  4:34 PM 08/20/2016  4:11 PM Full  Code [id number]  Vaughan Basta, MD Inpatient   11/04/2014  7:09 AM 11/09/2014  2:50 PM Full Code [id number]  Molly Maduro, MD ED    Advance Directive Documentation     Most Recent Value  Type of Advance Directive  Healthcare Power of Attorney, Living will  Pre-existing out of facility DNR order (yellow form or pink MOST form)  -  "MOST" Form in Place?  -      TOTAL TIME TAKING CARE OF THIS PATIENT: 35 minutes.    Loletha Grayer M.D on 08/31/2016 at 5:01 PM  Between 7am to 6pm - Pager - [phone]  After 6pm go to [email or web address] - password [email or web address]  [facility] Office  [phone]  CC: Primary care physician; Wilhemena Durie, MD

## 2016-08-31 NOTE — Discharge Instructions (Signed)
Healthcare-Associated Pneumonia °Healthcare-associated pneumonia is a lung infection that a person can get when in a health care setting or during certain procedures. The infection causes air sacs inside the lungs to fill with pus or fluid. °Healthcare-associated pneumonia is usually caused by bacteria that are common in health care settings. These bacteria may be resistant to some antibiotic medicines. °What are the causes? °This condition is caused by bacteria that get into your lungs. You can get this condition if you: °· Breathe in droplets from an infected person's cough or sneeze. °· Touch something that an infected person coughed or sneezed on and then touch your mouth, nose, or eyes. °· Have a bacterial infection somewhere else in your body, if the bacteria spread to your lungs through your blood. °What increases the risk? °This condition is more likely to develop in people who: °· Have a disease that weakens their body's defense system (immune system) or their ability to cough out germs. °· Are older than age 65. °· Having trouble swallowing. °· Use a feeding or breathing tube. °· Have a cold or the flu. °· Have an IV tube inserted in a vein. °· Have surgery. °· Have a bed sore. °· Live in a long-term care facility, such as a nursing home. °· Were in the hospital for two or more days in the past 3 months. °· Received hemodialysis in the past 30 days. °What are the signs or symptoms? °Symptoms of this condition include: °· Fever. °· Chills. °· Cough. °· Shortness of breath. °· Wheezing or crackling sounds when breathing. °How is this diagnosed? °This condition may be diagnosed based on: °· Your symptoms. °· A chest X-ray. °· A measurement of the amount of oxygen in your blood. °How is this treated? °This condition is treated with antibiotics. Your health care provider may take a sample of cells (culture) from your throat to determine what type of bacteria is in your lungs and change your antibiotic based on  the results. If you have bacteria in your blood, trouble breathing, or a low oxygen level, you may need to be treated at the hospital. At the hospital, you will be given antibiotics through an IV tube. You may also be given oxygen or breathing treatments. °Follow these instructions at home: °Medicine °· Take your antibiotic medicine as told by your health care provider. Do not stop taking the antibiotic even if you start to feel better. °· Take over-the-counter and other prescription medicines only as told by your health care provider. °Activity °· Rest at home until you feel better. °· Return to your normal activities as told by your health care provider. Ask your health care provider what activities are safe for you. °General instructions °· Drink enough fluid to keep your urine clear or pale yellow. °· Do not use any products that contain nicotine or tobacco, such as cigarettes and e-cigarettes. If you need help quitting, ask your health care provider. °· Limit alcohol intake to no more than 1 drink per day for nonpregnant women and 2 drinks per day for men. One drink equals 12 oz of beer, 5 oz of wine, or 1½ oz of hard liquor. °· Keep all follow-up visits as told by your health care provider. This is important. °How is this prevented? °Actions that I can take °To lower your risk of getting this condition again: °· Do not smoke. This includes e-cigarettes. °· Do not drink too much alcohol. °· Keep your immune system healthy by eating well and   getting enough sleep. °· Get a flu shot every year (annually). °· Get a pneumonia vaccination if: °¨ You are older than age 65. °¨ You smoke. °¨ You have a long-lasting condition like lung disease. °· Exercise your lungs by taking deep breaths, walking, and using an incentive spirometer as directed. °· Wash your hands often with soap and water. If you cannot get to a sink to wash your hands, use an alcohol-based hand cleaner. °· Make sure your health care providers are  washing their hands. If you do not see them wash their hands, ask them to do so. °· When you are in a health care facility, avoid touching your eyes, nose, and mouth. °· Avoid touching any surface near where people have coughed or sneezed. °· Stand away from sick people when they are coughing or sneezing. °· Wear a mask if you cannot avoid exposure to people who are sick. °· Clean all surfaces often with a disinfectant cleaner, especially if someone is sick at home or work. °Precautions of my health care team °Hospitals, nursing homes, and other health care facilities take special care to try to prevent healthcare-associated pneumonia. To do this, your health care team may: °· Clean their hands with soap and water or with alcohol-based hand sanitizer before and after seeing patients. °· Wear gloves or masks during treatment. °· Sanitize medical instruments, tubes, other equipment, and surfaces in patient rooms. °· Raise (elevate) the head of your hospital bed so you are not lying flat. The head of the bed may be elevated 30 degrees or more. °· Have you sit up and move around as soon as possible after surgery. °· Only insert a breathing tube if needed. °· Do these things for you if you have a breathing tube: °¨ Clean the inside of your mouth regularly. °¨ Remove the breathing tube as soon as it is no longer needed. °Contact a health care provider if: °· Your symptoms do not get better or they get worse. °· Your symptoms come back after you have finished taking your antibiotics. °Get help right away if: °· You have trouble breathing. °· You have confusion or difficulty thinking. °This information is not intended to replace advice given to you by your health care provider. Make sure you discuss any questions you have with your health care provider. °Document Released: 10/24/2015 Document Revised: 03/19/2016 Document Reviewed: 03/01/2016 °Elsevier Interactive Patient Education © 2017 [employer] ° °

## 2016-08-31 NOTE — Progress Notes (Signed)
Pt states ready to go home. Denies co's. Reports intermittent productive cough with yellow sputum. MD in and aware of low grade fever. Oral and written AVS instructions given with stated understanding. augmentin rx given to pt, inhalers/eye drops given to pt to take home. No further needs. Discharge home to self care. Pt transported in transport chair to private vehicle.

## 2016-09-02 ENCOUNTER — Telehealth: Payer: Self-pay

## 2016-09-02 NOTE — Telephone Encounter (Signed)
Transition Care Management Follow-up Telephone Call    Date discharged? 08/31/16  How have you been since you were released from the hospital? [place]. Still weak, some coughing and occasional sputum (whitish yellow colored). No other s/s.  Any patient concerns? None   Items Reviewed:  Medications reviewed: Yes  Allergies reviewed: Yes  Dietary changes reviewed: N/A  Referrals reviewed: N/A   Functional Questionnaire:  Independent - I Dependent - D    Activities of Daily Living (ADLs):    Personal hygiene - I Dressing - I Eating - I Maintaining continence - I Transferring - I   Independent Activities of Daily Living (iADLs): Basic communication skills - I Transportation - I Meal preparation - I Shopping - I Housework - I Managing medications - I  Managing personal finances - I   Confirmed importance and date/time of follow-up visits scheduled YES  Provider Appointment NOT booked with PCP. Message sent to schedulers.  Confirmed with patient if condition begins to worsen call PCP or go to the ER.  Patient was given the office number and encouraged to call back with question or concerns: YES

## 2016-09-03 LAB — CULTURE, BLOOD (ROUTINE X 2)
CULTURE: NO GROWTH
Culture: NO GROWTH

## 2016-09-05 ENCOUNTER — Ambulatory Visit
Admission: RE | Admit: 2016-09-05 | Discharge: 2016-09-05 | Disposition: A | Discharge status: Home / Self Care | Payer: PPO | Source: Ambulatory Visit | Attending: Family Medicine | Admitting: Family Medicine

## 2016-09-05 ENCOUNTER — Encounter: Payer: Self-pay | Admitting: Family Medicine

## 2016-09-05 ENCOUNTER — Ambulatory Visit (INDEPENDENT_AMBULATORY_CARE_PROVIDER_SITE_OTHER): Payer: PPO | Admitting: Family Medicine

## 2016-09-05 VITALS — BP 122/74 | HR 76 | Temp 97.7°F | Resp 18 | Wt 169.0 lb

## 2016-09-05 DIAGNOSIS — J189 Pneumonia, unspecified organism: Secondary | ICD-10-CM

## 2016-09-05 DIAGNOSIS — Z8701 Personal history of pneumonia (recurrent): Secondary | ICD-10-CM | POA: Diagnosis not present

## 2016-09-05 DIAGNOSIS — R05 Cough: Secondary | ICD-10-CM | POA: Insufficient documentation

## 2016-09-05 DIAGNOSIS — Z09 Encounter for follow-up examination after completed treatment for conditions other than malignant neoplasm: Secondary | ICD-10-CM

## 2016-09-05 NOTE — Progress Notes (Signed)
Subjective:  HPI Hospital stay 3/15-3/17/18  Admit diagnoses- Healthcare associated pneumonia  Hospital coarse. Multi lobular pneumonia with recent hospitalization. Started on Vancomycin and Maxipine. MRSA was negative. Vancomycin d/c'd Acute encephalopathy due to HCAP improved at d/c. Hx of DVT on Eliqus CAD Hx SVT Glaucoma Hx COPD Hyperlipidemia on lipitor Chronic lung infection on chronic Zithromax  D/c med changes started Augmentin. Stop Xarelto. All other medications continued.   Pt reports that he is still feeling weak and getting his strength back but he is feel better. He finished his antibiotic this morning. He is still coughing some but it is getting better.   Prior to Admission medications   Medication Sig Start Date End Date Taking? Authorizing Provider  ADVAIR HFA 230-21 MCG/ACT inhaler Inhale 2 puffs into the lungs 2 (two) times daily.  11/17/15   Historical Provider, MD  amoxicillin-clavulanate (AUGMENTIN) 875-125 MG tablet Take 1 tablet by mouth 2 (two) times daily. 08/31/16   Loletha Grayer, MD  apixaban (ELIQUIS) 5 MG TABS tablet Take 5 mg by mouth 2 (two) times daily.    Historical Provider, MD  aspirin EC 81 MG EC tablet Take 1 tablet (81 mg total) by mouth daily. 08/21/16   Loletha Grayer, MD  atorvastatin (LIPITOR) 40 MG tablet Take 1 tablet (40 mg total) by mouth daily at 6 PM. 08/27/16   Jerrol Banana., MD  azithromycin (ZITHROMAX) 250 MG tablet Take 250 mg by mouth daily.    Historical Provider, MD  brimonidine (ALPHAGAN) 0.15 % ophthalmic solution Place 1 drop into both eyes 2 (two) times daily.  10/26/15   Historical Provider, MD  Calcium Carb-Cholecalciferol (CALCIUM 600 + D PO) Take 1 tablet by mouth 2 (two) times daily.    Historical Provider, MD  diclofenac sodium (VOLTAREN) 1 % GEL Apply 2 g topically 4 (four) times daily.    Historical Provider, MD  diltiazem (CARDIZEM CD) 180 MG 24 hr capsule Take 1 capsule (180 mg total) by mouth daily.  08/27/16   Orella Cushman Maceo Pro., MD  dorzolamide (TRUSOPT) 2 % ophthalmic solution Place 1 drop into both eyes 2 (two) times daily.  08/31/15   Historical Provider, MD  fluticasone (FLONASE) 50 MCG/ACT nasal spray 2 sprays by Each Nare route Two (2) times a day. 03/27/16 03/27/17  Historical Provider, MD  gabapentin (NEURONTIN) 400 MG capsule Take 400 mg by mouth daily.  09/18/15   Historical Provider, MD  isosorbide mononitrate (IMDUR) 30 MG 24 hr tablet Take 1 tablet (30 mg total) by mouth daily. 08/21/16   Loletha Grayer, MD  latanoprost (XALATAN) 0.005 % ophthalmic solution Place 1 drop into both eyes at bedtime.     Historical Provider, MD  Multiple Vitamin (MULTIVITAMIN) tablet Take 1 tablet by mouth daily.    Historical Provider, MD  tiotropium (SPIRIVA HANDIHALER) 18 MCG inhalation capsule Place 18 mcg into inhaler and inhale daily.  02/06/15 08/18/17  Historical Provider, MD    Patient Active Problem List   Diagnosis Date Noted  . HCAP (healthcare-associated pneumonia) 08/29/2016  . NSTEMI (non-ST elevated myocardial infarction) ([place]) 08/18/2016  . Chronic obstructive pulmonary disease ([place]) 01/02/2016  . DVT, femoral, acute ([place]) 05/15/2015  . Recurrent pneumonia 12/21/2014  . Pneumothorax, right 11/04/2014  . Basal cell carcinoma 10/19/2014  . Abnormal liver enzymes 10/19/2014  . Personal history of methicillin resistant Staphylococcus aureus 10/19/2014  . Cannot sleep 10/19/2014  . Heel pain 10/19/2014  . Pain in shoulder 10/19/2014  . Back strain  10/19/2014  . Pulmonary aspergillosis, allergic bronchopulmonary type ([place]) 07/30/2014  . ABPA (allergic bronchopulmonary aspergillosis) ([place]) 07/30/2014  . CAP (community acquired pneumonia) 05/03/2014  . Abnormal CXR 04/01/2014  . Paroxysmal tachycardia ([place]) 06/23/2013  . Asymptomatic PVCs 06/23/2013  . Beat, premature ventricular 06/23/2013  . Cough 10/19/2012  . Allergic rhinitis 01/17/2009  . Asthma, chronic obstructive, without  status asthmaticus ([place]) 11/04/2008  . CAFL (chronic airflow limitation) ([place]) 11/04/2008  . Essential hypertension 02/09/2008  . Arthropathia 08/28/2007  . Disorder of mitral valve 07/08/2005  . Benign prostatic hypertrophy without urinary obstruction 05/16/1997  . LBP (low back pain) 05/16/1997  . Degenerative arthritis of hip 03/18/1995    Past Medical History:  Diagnosis Date  . AB (asthmatic bronchitis)   . Allergic rhinitis   . Arthropathy   . Asthma   . Cellulitis   . DVT (deep venous thrombosis) ([place])   . Foreign body in skin   . Glaucoma   . Hx MRSA infection   . Hx MRSA infection   . Hypertension   . Insomnia   . Insomnia   . Mitral valve disorder   . Osteoarthrosis   . Upper back strain     Social History   Social History  . Marital status: Married    Spouse name: Everlene Farrier  . Number of children: 2  . Years of education: College   Occupational History  . Retired Retired    Chief Financial Officer   Social History Main Topics  . Smoking status: Former Smoker    Packs/day: 0.50    Years: 3.00    Types: Cigarettes    Quit date: 06/17/1958  . Smokeless tobacco: Never Used     Comment: smoking while in college  . Alcohol use No     Comment: OCCASSIONAL WINE ONLY  . Drug use: No  . Sexual activity: Not on file   Other Topics Concern  . Not on file   Social History Narrative   He is a married father of 2 daughters.   He smoked casually while in college for about 3 years less than a pack a day.   He drinks 3-4 glass of wine at night. He also has about 2 servings of coffee a day   He is a very active gentleman with [employer] exercise program at least 3-4 days a week.   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   In addition to this standing his exercise, he does lots of gardening and uses a chain saw to cut wood.             Allergies  Allergen Reactions  . Timolol Maleate [Timolol Maleate] Shortness Of Breath  . Bactrim  [Sulfamethoxazole-Trimethoprim]     Pt says "it takes his skin pigment off  . Budesonide-Formoterol Fumarate Other (See Comments)    Patient reports dizziness and trembling/shaky feeling.  . Lisinopril     cough    Review of Systems  Constitutional: Positive for malaise/fatigue.  HENT: Negative.   Eyes: Negative.   Respiratory: Positive for cough and shortness of breath.   Cardiovascular: Negative.   Gastrointestinal: Negative.   Genitourinary: Negative.   Musculoskeletal: Negative.   Skin: Negative.   Neurological: Positive for weakness.  Endo/Heme/Allergies: Negative.   Psychiatric/Behavioral: Negative.     Immunization History  Administered Date(s) Administered  . Influenza Split 03/02/2012, 03/17/2013, 05/07/2014  . Influenza Whole 03/24/2007, 03/15/2009, 02/15/2010  . Influenza, High Dose Seasonal PF 05/04/2015, 03/21/2016  .  Pneumococcal Conjugate-13 11/17/2013  . Pneumococcal Polysaccharide-23 11/19/2004, 03/19/2007, 03/17/2009  . Td 05/16/1997, 05/14/2007, 09/11/2010  . Zoster 01/02/2011    Objective:  BP 122/74 (BP Location: Left Arm, Patient Position: Sitting, Cuff Size: Normal)   Pulse 76   Temp 97.7 F (36.5 C) (Oral)   Resp 18   Wt 169 lb (76.7 kg)   SpO2 90%   BMI 21.70 kg/m   Physical Exam  Constitutional: He is oriented to person, place, and time and well-developed, well-nourished, and in no distress.  HENT:  Head: Normocephalic and atraumatic.  Right Ear: External ear normal.  Left Ear: External ear normal.  Nose: Nose normal.  Eyes: Conjunctivae and EOM are normal. Pupils are equal, round, and reactive to light.  Neck: Normal range of motion. Neck supple.  Cardiovascular: Normal rate, regular rhythm, normal heart sounds and intact distal pulses.   Pulmonary/Chest: Effort normal and breath sounds normal.  Musculoskeletal: Normal range of motion. He exhibits no edema.  Neurological: He is alert and oriented to person, place, and time. He has  normal reflexes. Gait normal. GCS score is 15.  Skin: Skin is warm and dry.  Psychiatric: Mood, memory, affect and judgment normal.    Lab Results  Component Value Date   WBC 6.9 08/30/2016   HGB 13.6 08/30/2016   HCT 40.2 08/30/2016   PLT 283 08/30/2016   GLUCOSE 83 08/31/2016   CHOL 102 08/29/2016   TRIG 44 08/29/2016   HDL 55 08/29/2016   LDLCALC 38 08/29/2016   TSH 1.260 01/02/2016   PSA 3.6 11/17/2013   INR 1.54 08/18/2016   HGBA1C 5.4 08/18/2016    CMP     Component Value Date/Time   NA 137 08/31/2016 0458   NA 141 08/29/2016 0814   NA 138 04/08/2014 0540   K 3.6 08/31/2016 0458   K 4.4 04/08/2014 0540   CL 105 08/31/2016 0458   CL 104 04/08/2014 0540   CO2 24 08/31/2016 0458   CO2 27 04/08/2014 0540   GLUCOSE 83 08/31/2016 0458   GLUCOSE 134 (H) 04/08/2014 0540   BUN 13 08/31/2016 0458   BUN 8 08/29/2016 0814   BUN 15 04/08/2014 0540   CREATININE 1.22 08/31/2016 0458   CREATININE 1.23 04/08/2014 0540   CALCIUM 8.4 (L) 08/31/2016 0458   CALCIUM 7.9 (L) 04/08/2014 0540   PROT 7.4 08/29/2016 1908   PROT 6.3 08/29/2016 0814   PROT 7.2 04/07/2014 1745   ALBUMIN 3.9 08/29/2016 1908   ALBUMIN 3.8 08/29/2016 0814   ALBUMIN 3.0 (L) 04/07/2014 1745   AST 30 08/29/2016 1908   AST 15 04/07/2014 1745   ALT 27 08/29/2016 1908   ALT 24 04/07/2014 1745   ALKPHOS 93 08/29/2016 1908   ALKPHOS 94 04/07/2014 1745   BILITOT 0.9 08/29/2016 1908   BILITOT 0.6 08/29/2016 0814   BILITOT 0.8 04/07/2014 1745   GFRNONAA 55 (L) 08/31/2016 0458   GFRNONAA >60 04/08/2014 0540   GFRNONAA >60 12/22/2013 2235   GFRAA >60 08/31/2016 0458   GFRAA >60 04/08/2014 0540   GFRAA >60 12/22/2013 2235    Assessment and Plan :  1. HCAP (healthcare-associated pneumonia) Clinically improving.may need pulmonary folow up on this. - DG Chest 2 View; Future  2. Hospital discharge follow-up Improving. 3.Hypoxia 4.DVT HPI, Exam, and A&P Transcribed under the direction and in the presence  of Jahsir Rama L. Cranford Mon, MD  Electronically Signed: Katina Dung, CMA I have done the exam and reviewed the above chart and  it is accurate to the best of my knowledge. Development worker, community has been used in this note in any air is in the dictation or transcription are unintentional.  [employer] 09/05/2016 11:05 AM

## 2016-09-05 NOTE — Progress Notes (Signed)
Advised  ED 

## 2016-09-07 ENCOUNTER — Inpatient Hospital Stay: Payer: PPO | Admitting: Family Medicine

## 2016-09-09 DIAGNOSIS — Z8619 Personal history of other infectious and parasitic diseases: Secondary | ICD-10-CM | POA: Diagnosis not present

## 2016-09-09 DIAGNOSIS — Z87891 Personal history of nicotine dependence: Secondary | ICD-10-CM | POA: Diagnosis not present

## 2016-09-09 DIAGNOSIS — J45909 Unspecified asthma, uncomplicated: Secondary | ICD-10-CM | POA: Diagnosis not present

## 2016-09-09 DIAGNOSIS — Z9089 Acquired absence of other organs: Secondary | ICD-10-CM | POA: Diagnosis not present

## 2016-09-09 DIAGNOSIS — Z7951 Long term (current) use of inhaled steroids: Secondary | ICD-10-CM | POA: Diagnosis not present

## 2016-09-09 DIAGNOSIS — Z8669 Personal history of other diseases of the nervous system and sense organs: Secondary | ICD-10-CM | POA: Diagnosis not present

## 2016-09-09 DIAGNOSIS — Z8249 Family history of ischemic heart disease and other diseases of the circulatory system: Secondary | ICD-10-CM | POA: Diagnosis not present

## 2016-09-09 DIAGNOSIS — Z888 Allergy status to other drugs, medicaments and biological substances status: Secondary | ICD-10-CM | POA: Diagnosis not present

## 2016-09-09 DIAGNOSIS — Z803 Family history of malignant neoplasm of breast: Secondary | ICD-10-CM | POA: Diagnosis not present

## 2016-09-10 ENCOUNTER — Other Ambulatory Visit: Payer: Self-pay

## 2016-09-10 ENCOUNTER — Telehealth: Payer: Self-pay

## 2016-09-10 ENCOUNTER — Encounter: Payer: Self-pay | Admitting: Family Medicine

## 2016-09-10 DIAGNOSIS — I479 Paroxysmal tachycardia, unspecified: Secondary | ICD-10-CM

## 2016-09-10 DIAGNOSIS — E785 Hyperlipidemia, unspecified: Secondary | ICD-10-CM

## 2016-09-10 MED ORDER — ISOSORBIDE MONONITRATE ER 30 MG PO TB24: 30.0000 mg | ORAL_TABLET | Freq: Every day | ORAL | 3 refills | Status: DC

## 2016-09-10 MED ORDER — ATORVASTATIN CALCIUM 40 MG PO TABS: 40.0000 mg | ORAL_TABLET | Freq: Every day | ORAL | 3 refills | Status: DC

## 2016-09-10 MED ORDER — DILTIAZEM HCL ER COATED BEADS 180 MG PO CP24: 180.0000 mg | ORAL_CAPSULE | Freq: Every day | ORAL | 3 refills | Status: DC

## 2016-09-10 NOTE — Telephone Encounter (Signed)
Ok to rf all for 1 year.

## 2016-09-10 NOTE — Telephone Encounter (Signed)
Good Morning DR. Rosanna Randy,    When I was in the hospital DR. Wieting prescribed the following meds:  40mg  Lipitor  180mg  Cardizem  30mg  Imdur    He indicated no refills on all of the above. Should all of these meds be discontinued?    Thanks  [name]   Does patient need to continue medications above? If so will need refills-aa

## 2016-09-23 DIAGNOSIS — R0602 Shortness of breath: Secondary | ICD-10-CM | POA: Diagnosis not present

## 2016-09-23 DIAGNOSIS — I2510 Atherosclerotic heart disease of native coronary artery without angina pectoris: Secondary | ICD-10-CM | POA: Diagnosis not present

## 2016-09-23 DIAGNOSIS — R55 Syncope and collapse: Secondary | ICD-10-CM | POA: Diagnosis not present

## 2016-10-03 ENCOUNTER — Telehealth: Payer: Self-pay | Admitting: *Deleted

## 2016-10-03 ENCOUNTER — Ambulatory Visit (INDEPENDENT_AMBULATORY_CARE_PROVIDER_SITE_OTHER): Payer: PPO | Admitting: Podiatry

## 2016-10-03 ENCOUNTER — Encounter: Payer: Self-pay | Admitting: Podiatry

## 2016-10-03 DIAGNOSIS — M7751 Other enthesopathy of right foot: Secondary | ICD-10-CM | POA: Diagnosis not present

## 2016-10-03 DIAGNOSIS — M779 Enthesopathy, unspecified: Principal | ICD-10-CM

## 2016-10-03 DIAGNOSIS — M778 Other enthesopathies, not elsewhere classified: Secondary | ICD-10-CM

## 2016-10-03 NOTE — Telephone Encounter (Addendum)
-----   Message from [place] sent at 10/03/2016 11:55 AM EDT ----- Regarding: MRI Orders are in for MRI right foot. Thanks!! Orders given to D. [facility] and faxed to [facility].

## 2016-10-03 NOTE — Progress Notes (Signed)
He presents today with a chief complaint of pain to Lisfranc's joints. States that the injection did nothing for his pain last time. He also states that is becoming more painful right ear as we can palpate a nodule on the medial aspect of the second metatarsal medial diaphyseal region.  Objective: Vital signs stable is alert and oriented 3 palpable nodule and really that the only pain he has around the second metatarsal. He has a lot of arthritis around Lisfranc's joints.  Assessment: I recommend him in an MRI for his right foot.  Plan: Recommended MRI of the right foot secondary to Lisfranc's capsulitis osteoarthritis and second metatarsal pain.

## 2016-10-07 ENCOUNTER — Ambulatory Visit (INDEPENDENT_AMBULATORY_CARE_PROVIDER_SITE_OTHER): Payer: PPO | Admitting: Family Medicine

## 2016-10-07 VITALS — BP 126/78 | HR 70 | Temp 97.8°F | Resp 14 | Wt 171.0 lb

## 2016-10-07 DIAGNOSIS — I82419 Acute embolism and thrombosis of unspecified femoral vein: Secondary | ICD-10-CM | POA: Diagnosis not present

## 2016-10-07 DIAGNOSIS — J189 Pneumonia, unspecified organism: Secondary | ICD-10-CM | POA: Diagnosis not present

## 2016-10-07 DIAGNOSIS — I10 Essential (primary) hypertension: Secondary | ICD-10-CM | POA: Diagnosis not present

## 2016-10-07 NOTE — Progress Notes (Signed)
[name]  MRN: [id number] DOB: 13-Apr-1941  Subjective:  HPI  Patient is here for 1 month follow up. LOV was on 09/05/16 for hospital follow up for Pneumonia. Chest Xray was done at that time for repeat and results were ok. Patient is doing well, no lingering symptoms. Patient Active Problem List   Diagnosis Date Noted  . HCAP (healthcare-associated pneumonia) 08/29/2016  . NSTEMI (non-ST elevated myocardial infarction) ([place]) 08/18/2016  . Chronic obstructive pulmonary disease ([place]) 01/02/2016  . DVT, femoral, acute ([place]) 05/15/2015  . Recurrent pneumonia 12/21/2014  . Pneumothorax, right 11/04/2014  . Basal cell carcinoma 10/19/2014  . Abnormal liver enzymes 10/19/2014  . Personal history of methicillin resistant Staphylococcus aureus 10/19/2014  . Cannot sleep 10/19/2014  . Heel pain 10/19/2014  . Pain in shoulder 10/19/2014  . Back strain 10/19/2014  . Pulmonary aspergillosis, allergic bronchopulmonary type ([place]) 07/30/2014  . ABPA (allergic bronchopulmonary aspergillosis) ([place]) 07/30/2014  . CAP (community acquired pneumonia) 05/03/2014  . Abnormal CXR 04/01/2014  . Paroxysmal tachycardia ([place]) 06/23/2013  . Asymptomatic PVCs 06/23/2013  . Beat, premature ventricular 06/23/2013  . Cough 10/19/2012  . Allergic rhinitis 01/17/2009  . Asthma, chronic obstructive, without status asthmaticus ([place]) 11/04/2008  . CAFL (chronic airflow limitation) ([place]) 11/04/2008  . Essential hypertension 02/09/2008  . Arthropathia 08/28/2007  . Disorder of mitral valve 07/08/2005  . Benign prostatic hypertrophy without urinary obstruction 05/16/1997  . LBP (low back pain) 05/16/1997  . Degenerative arthritis of hip 03/18/1995    Past Medical History:  Diagnosis Date  . AB (asthmatic bronchitis)   . Allergic rhinitis   . Arthropathy   . Asthma   . Cellulitis   . DVT (deep venous thrombosis) ([place])   . Foreign body in skin   . Glaucoma   . Hx MRSA infection   . Hx MRSA infection     . Hypertension   . Insomnia   . Insomnia   . Mitral valve disorder   . Osteoarthrosis   . Upper back strain     Social History   Social History  . Marital status: Married    Spouse name: Everlene Farrier  . Number of children: 2  . Years of education: College   Occupational History  . Retired Retired    Chief Financial Officer   Social History Main Topics  . Smoking status: Former Smoker    Packs/day: 0.50    Years: 3.00    Types: Cigarettes    Quit date: 06/17/1958  . Smokeless tobacco: Never Used     Comment: smoking while in college  . Alcohol use No     Comment: OCCASSIONAL WINE ONLY  . Drug use: No  . Sexual activity: Not on file   Other Topics Concern  . Not on file   Social History Narrative   He is a married father of 2 daughters.   He smoked casually while in college for about 3 years less than a pack a day.   He drinks 3-4 glass of wine at night. He also has about 2 servings of coffee a day   He is a very active gentleman with [employer] exercise program at least 3-4 days a week.   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   In addition to this standing his exercise, he does lots of gardening and uses a chain saw to cut wood.             Outpatient Encounter Prescriptions as  of 10/07/2016  Medication Sig Note  . ADVAIR HFA 230-21 MCG/ACT inhaler Inhale 2 puffs into the lungs 2 (two) times daily.    Marland Kitchen aspirin EC 81 MG EC tablet Take 1 tablet (81 mg total) by mouth daily.   Marland Kitchen atorvastatin (LIPITOR) 40 MG tablet Take 1 tablet (40 mg total) by mouth daily at 6 PM.   . azithromycin (ZITHROMAX) 250 MG tablet Take by mouth daily.   . brimonidine (ALPHAGAN) 0.15 % ophthalmic solution Place 1 drop into both eyes 2 (two) times daily.    . Calcium Carb-Cholecalciferol (CALCIUM 600 + D PO) Take 1 tablet by mouth 2 (two) times daily.   . diclofenac sodium (VOLTAREN) 1 % GEL Apply 2 g topically 4 (four) times daily.   Marland Kitchen diltiazem (CARDIZEM CD) 180 MG 24 hr  capsule Take 1 capsule (180 mg total) by mouth daily.   . dorzolamide (TRUSOPT) 2 % ophthalmic solution Place 1 drop into both eyes 2 (two) times daily.    . fluticasone (FLONASE) 50 MCG/ACT nasal spray 2 sprays by Each Nare route Two (2) times a day.   . gabapentin (NEURONTIN) 400 MG capsule Take 400 mg by mouth daily.    . isosorbide mononitrate (IMDUR) 30 MG 24 hr tablet Take 1 tablet (30 mg total) by mouth daily.   Marland Kitchen latanoprost (XALATAN) 0.005 % ophthalmic solution Place 1 drop into both eyes at bedtime.    . Multiple Vitamin (MULTIVITAMIN) tablet Take 1 tablet by mouth daily.   Marland Kitchen tiotropium (SPIRIVA HANDIHALER) 18 MCG inhalation capsule Place 18 mcg into inhaler and inhale daily.    Alveda Reasons 20 MG TABS tablet  09/05/2016: Pt taking Eliqus right now, but when that rx is finished he will change back to Xarelto.   . [DISCONTINUED] amoxicillin-clavulanate (AUGMENTIN) 875-125 MG tablet Take 1 tablet by mouth 2 (two) times daily. (Patient not taking: Reported on 09/05/2016)   . [DISCONTINUED] apixaban (ELIQUIS) 5 MG TABS tablet Take 5 mg by mouth 2 (two) times daily.    No facility-administered encounter medications on file as of 10/07/2016.     Allergies  Allergen Reactions  . Timolol Maleate [Timolol Maleate] Shortness Of Breath  . Bactrim [Sulfamethoxazole-Trimethoprim]     Pt says "it takes his skin pigment off  . Budesonide-Formoterol Fumarate Other (See Comments)    Patient reports dizziness and trembling/shaky feeling.  . Lisinopril     cough    Review of Systems  Constitutional: Negative.   Eyes: Negative.   Respiratory: Negative.   Cardiovascular: Negative.   Gastrointestinal: Negative.   Musculoskeletal: Negative.   Skin: Negative.   Endo/Heme/Allergies: Negative.   Psychiatric/Behavioral: Negative.     Objective:  BP 126/78   Pulse 70   Temp 97.8 F (36.6 C)   Resp 14   Wt 171 lb (77.6 kg)   SpO2 95%   BMI 21.96 kg/m   Physical Exam  Constitutional: He is  oriented to person, place, and time and well-developed, well-nourished, and in no distress.  HENT:  Head: Normocephalic and atraumatic.  Right Ear: External ear normal.  Left Ear: External ear normal.  Nose: Nose normal.  Eyes: Conjunctivae are normal. Pupils are equal, round, and reactive to light.  Neck: Normal range of motion. Neck supple.  Cardiovascular: Normal rate, regular rhythm, normal heart sounds and intact distal pulses.   No murmur heard. Pulmonary/Chest: Effort normal and breath sounds normal. No respiratory distress. He has no wheezes.  Abdominal: Soft.  Musculoskeletal: He exhibits  no edema or tenderness.  Neurological: He is alert and oriented to person, place, and time. Gait normal. GCS score is 15.  Skin: Skin is warm and dry.  Psychiatric: Mood, memory, affect and judgment normal.    Assessment and Plan :  1. HCAP (healthcare-associated pneumonia) Resolved at this time. Follow as needed.  2. Essential hypertension Stable.  3. Acute deep vein thrombosis (DVT) of femoral vein, unspecified laterality (HCC) Stable. Following pulmonologist. 4.COPD  HPI, Exam and A&P transcribed by Theressa Millard, RMA under direction and in the presence of Miguel Aschoff, MD. I have done the exam and reviewed the chart and it is accurate to the best of my knowledge. Development worker, community has been used and  any errors in dictation or transcription are unintentional. Miguel Aschoff M.D. [facility]

## 2016-10-11 ENCOUNTER — Telehealth: Payer: Self-pay | Admitting: *Deleted

## 2016-10-11 NOTE — Telephone Encounter (Signed)
"  [name] is scheduled for a MRI of foot [name] May 2.  He has [employer] and it needs to be authorized.

## 2016-10-14 ENCOUNTER — Ambulatory Visit: Payer: PPO | Admitting: Podiatry

## 2016-10-16 ENCOUNTER — Ambulatory Visit
Admission: RE | Admit: 2016-10-16 | Discharge: 2016-10-16 | Disposition: A | Discharge status: Home / Self Care | Payer: PPO | Source: Ambulatory Visit | Attending: Podiatry | Admitting: Podiatry

## 2016-10-16 DIAGNOSIS — M779 Enthesopathy, unspecified: Principal | ICD-10-CM

## 2016-10-16 DIAGNOSIS — M778 Other enthesopathies, not elsewhere classified: Secondary | ICD-10-CM

## 2016-10-16 DIAGNOSIS — M19071 Primary osteoarthritis, right ankle and foot: Secondary | ICD-10-CM | POA: Diagnosis not present

## 2016-10-17 NOTE — Telephone Encounter (Addendum)
-----   Message from Garrel Ridgel, [place] sent at 10/16/2016 12:01 PM EDT ----- Send for over read and inform patient of the delay. Left message informing pt of Dr.Hyatt's orders and explained the delay.10/18/2016-Mailed copy of MRI disc to [facility].

## 2016-10-22 ENCOUNTER — Encounter: Payer: Self-pay | Admitting: Podiatry

## 2016-10-22 ENCOUNTER — Ambulatory Visit: Payer: PPO | Admitting: Family Medicine

## 2016-11-05 ENCOUNTER — Inpatient Hospital Stay
Admission: EM | Admit: 2016-11-05 | Discharge: 2016-11-07 | DRG: 872 | Disposition: A | Discharge status: Home / Self Care | Payer: PPO | Attending: Specialist | Admitting: Specialist

## 2016-11-05 ENCOUNTER — Emergency Department: Payer: PPO

## 2016-11-05 ENCOUNTER — Encounter: Payer: Self-pay | Admitting: Emergency Medicine

## 2016-11-05 DIAGNOSIS — I252 Old myocardial infarction: Secondary | ICD-10-CM | POA: Diagnosis not present

## 2016-11-05 DIAGNOSIS — A419 Sepsis, unspecified organism: Secondary | ICD-10-CM | POA: Diagnosis not present

## 2016-11-05 DIAGNOSIS — K5732 Diverticulitis of large intestine without perforation or abscess without bleeding: Secondary | ICD-10-CM | POA: Diagnosis present

## 2016-11-05 DIAGNOSIS — Z888 Allergy status to other drugs, medicaments and biological substances status: Secondary | ICD-10-CM | POA: Diagnosis not present

## 2016-11-05 DIAGNOSIS — I482 Chronic atrial fibrillation: Secondary | ICD-10-CM | POA: Diagnosis not present

## 2016-11-05 DIAGNOSIS — J449 Chronic obstructive pulmonary disease, unspecified: Secondary | ICD-10-CM | POA: Diagnosis present

## 2016-11-05 DIAGNOSIS — R509 Fever, unspecified: Secondary | ICD-10-CM | POA: Diagnosis not present

## 2016-11-05 DIAGNOSIS — Z8619 Personal history of other infectious and parasitic diseases: Secondary | ICD-10-CM | POA: Diagnosis present

## 2016-11-05 DIAGNOSIS — K5792 Diverticulitis of intestine, part unspecified, without perforation or abscess without bleeding: Secondary | ICD-10-CM | POA: Diagnosis not present

## 2016-11-05 DIAGNOSIS — H409 Unspecified glaucoma: Secondary | ICD-10-CM | POA: Diagnosis present

## 2016-11-05 DIAGNOSIS — I10 Essential (primary) hypertension: Secondary | ICD-10-CM | POA: Diagnosis present

## 2016-11-05 DIAGNOSIS — Z882 Allergy status to sulfonamides status: Secondary | ICD-10-CM

## 2016-11-05 DIAGNOSIS — I2510 Atherosclerotic heart disease of native coronary artery without angina pectoris: Secondary | ICD-10-CM | POA: Diagnosis present

## 2016-11-05 DIAGNOSIS — J45909 Unspecified asthma, uncomplicated: Secondary | ICD-10-CM | POA: Diagnosis not present

## 2016-11-05 DIAGNOSIS — Z79899 Other long term (current) drug therapy: Secondary | ICD-10-CM

## 2016-11-05 DIAGNOSIS — Z86718 Personal history of other venous thrombosis and embolism: Secondary | ICD-10-CM | POA: Diagnosis not present

## 2016-11-05 DIAGNOSIS — R1032 Left lower quadrant pain: Secondary | ICD-10-CM | POA: Diagnosis not present

## 2016-11-05 DIAGNOSIS — J4489 Other specified chronic obstructive pulmonary disease: Secondary | ICD-10-CM | POA: Diagnosis present

## 2016-11-05 DIAGNOSIS — R531 Weakness: Secondary | ICD-10-CM | POA: Diagnosis not present

## 2016-11-05 LAB — COMPREHENSIVE METABOLIC PANEL
ALBUMIN: 3.8 g/dL (ref 3.5–5.0)
ALK PHOS: 74 U/L (ref 38–126)
ALT: 36 U/L (ref 17–63)
ANION GAP: 6 (ref 5–15)
AST: 31 U/L (ref 15–41)
BUN: 13 mg/dL (ref 6–20)
CALCIUM: 8.5 mg/dL — AB (ref 8.9–10.3)
CO2: 24 mmol/L (ref 22–32)
Chloride: 107 mmol/L (ref 101–111)
Creatinine, Ser: 0.95 mg/dL (ref 0.61–1.24)
GFR calc non Af Amer: >60 mL/min (ref >60)
GLUCOSE: 84 mg/dL (ref 65–99)
POTASSIUM: 3.8 mmol/L (ref 3.5–5.1)
SODIUM: 137 mmol/L (ref 135–145)
Total Bilirubin: 1.6 mg/dL — ABNORMAL HIGH (ref 0.3–1.2)
Total Protein: 6.1 g/dL — ABNORMAL LOW (ref 6.5–8.1)

## 2016-11-05 LAB — CBC
HEMATOCRIT: 47 % (ref 40.0–52.0)
HEMOGLOBIN: 16 g/dL (ref 13.0–18.0)
MCH: 33.2 pg (ref 26.0–34.0)
MCHC: 34.1 g/dL (ref 32.0–36.0)
MCV: 97.2 fL (ref 80.0–100.0)
Platelets: 189 10*3/uL (ref 150–440)
RBC: 4.83 MIL/uL (ref 4.40–5.90)
RDW: 14.8 % — AB (ref 11.5–14.5)
WBC: 13.1 10*3/uL — AB (ref 3.8–10.6)

## 2016-11-05 LAB — BASIC METABOLIC PANEL
Anion gap: 4 — ABNORMAL LOW (ref 5–15)
BUN: 14 mg/dL (ref 6–20)
CO2: 29 mmol/L (ref 22–32)
Calcium: 9.3 mg/dL (ref 8.9–10.3)
Chloride: 101 mmol/L (ref 101–111)
Creatinine, Ser: 1.24 mg/dL (ref 0.61–1.24)
GFR calc Af Amer: >60 mL/min (ref >60)
GFR, EST NON AFRICAN AMERICAN: 54 mL/min — AB (ref >60)
Glucose, Bld: 112 mg/dL — ABNORMAL HIGH (ref 65–99)
POTASSIUM: 4.2 mmol/L (ref 3.5–5.1)
SODIUM: 134 mmol/L — AB (ref 135–145)

## 2016-11-05 LAB — DIFFERENTIAL
BASOS ABS: 0.1 10*3/uL (ref 0–0.1)
Basophils Relative: 1 %
EOS ABS: 0.1 10*3/uL (ref 0–0.7)
EOS PCT: 1 %
LYMPHS ABS: 1.5 10*3/uL (ref 1.0–3.6)
LYMPHS PCT: 12 %
Monocytes Absolute: 1.2 10*3/uL — ABNORMAL HIGH (ref 0.2–1.0)
Monocytes Relative: 9 %
NEUTROS PCT: 77 %
Neutro Abs: 9.9 10*3/uL — ABNORMAL HIGH (ref 1.4–6.5)

## 2016-11-05 LAB — URINALYSIS, COMPLETE (UACMP) WITH MICROSCOPIC
BACTERIA UA: NONE SEEN
BILIRUBIN URINE: NEGATIVE
GLUCOSE, UA: NEGATIVE mg/dL
HGB URINE DIPSTICK: NEGATIVE
Ketones, ur: NEGATIVE mg/dL
LEUKOCYTES UA: NEGATIVE
NITRITE: NEGATIVE
Protein, ur: NEGATIVE mg/dL
SPECIFIC GRAVITY, URINE: 1.013 (ref 1.005–1.030)
Squamous Epithelial / LPF: NONE SEEN
pH: 7 (ref 5.0–8.0)

## 2016-11-05 LAB — LACTIC ACID, PLASMA: LACTIC ACID, VENOUS: 1.8 mmol/L (ref 0.5–1.9)

## 2016-11-05 LAB — BRAIN NATRIURETIC PEPTIDE: B NATRIURETIC PEPTIDE 5: 91 pg/mL (ref 0.0–100.0)

## 2016-11-05 LAB — TROPONIN I: Troponin I: 0.03 ng/mL (ref <0.03)

## 2016-11-05 MED ORDER — ONDANSETRON HCL 4 MG PO TABS: 4.0000 mg | ORAL_TABLET | Freq: Four times a day (QID) | ORAL | Status: DC | PRN

## 2016-11-05 MED ORDER — METRONIDAZOLE IN NACL 5-0.79 MG/ML-% IV SOLN
500.0000 mg | Freq: Once | INTRAVENOUS | Status: AC
  Administered 2016-11-05: 500 mg via INTRAVENOUS
  Filled 2016-11-05: qty 100

## 2016-11-05 MED ORDER — RIVAROXABAN 20 MG PO TABS
20.0000 mg | ORAL_TABLET | Freq: Every day | ORAL | Status: DC
  Administered 2016-11-06: 20 mg via ORAL
  Filled 2016-11-05 (×2): qty 1

## 2016-11-05 MED ORDER — ASPIRIN EC 81 MG PO TBEC
81.0000 mg | DELAYED_RELEASE_TABLET | Freq: Every day | ORAL | Status: DC
  Administered 2016-11-06 – 2016-11-07 (×2): 81 mg via ORAL
  Filled 2016-11-05 (×2): qty 1

## 2016-11-05 MED ORDER — ATORVASTATIN CALCIUM 20 MG PO TABS
40.0000 mg | ORAL_TABLET | Freq: Every day | ORAL | Status: DC
  Administered 2016-11-06: 40 mg via ORAL
  Filled 2016-11-05: qty 2

## 2016-11-05 MED ORDER — DILTIAZEM HCL ER COATED BEADS 180 MG PO CP24
180.0000 mg | ORAL_CAPSULE | Freq: Every day | ORAL | Status: DC
  Administered 2016-11-07: 180 mg via ORAL
  Filled 2016-11-05 (×2): qty 1

## 2016-11-05 MED ORDER — PIPERACILLIN-TAZOBACTAM 3.375 G IVPB
3.3750 g | Freq: Three times a day (TID) | INTRAVENOUS | Status: DC
  Administered 2016-11-06 – 2016-11-07 (×4): 3.375 g via INTRAVENOUS
  Filled 2016-11-05 (×6): qty 50

## 2016-11-05 MED ORDER — TIOTROPIUM BROMIDE MONOHYDRATE 18 MCG IN CAPS
18.0000 ug | ORAL_CAPSULE | Freq: Every day | RESPIRATORY_TRACT | Status: DC
  Administered 2016-11-06: 18 ug via RESPIRATORY_TRACT
  Filled 2016-11-05: qty 5

## 2016-11-05 MED ORDER — ACETAMINOPHEN 650 MG RE SUPP: 650.0000 mg | Freq: Four times a day (QID) | RECTAL | Status: DC | PRN

## 2016-11-05 MED ORDER — ISOSORBIDE MONONITRATE ER 30 MG PO TB24
30.0000 mg | ORAL_TABLET | Freq: Every day | ORAL | Status: DC
  Administered 2016-11-06 – 2016-11-07 (×2): 30 mg via ORAL
  Filled 2016-11-05 (×2): qty 1

## 2016-11-05 MED ORDER — DORZOLAMIDE HCL 2 % OP SOLN
1.0000 [drp] | Freq: Two times a day (BID) | OPHTHALMIC | Status: DC
  Administered 2016-11-06 – 2016-11-07 (×2): 1 [drp] via OPHTHALMIC
  Filled 2016-11-05: qty 10

## 2016-11-05 MED ORDER — ACETAMINOPHEN 325 MG PO TABS
650.0000 mg | ORAL_TABLET | Freq: Four times a day (QID) | ORAL | Status: DC | PRN
  Administered 2016-11-06: 650 mg via ORAL
  Filled 2016-11-05: qty 2

## 2016-11-05 MED ORDER — MOMETASONE FURO-FORMOTEROL FUM 200-5 MCG/ACT IN AERO
2.0000 | INHALATION_SPRAY | Freq: Two times a day (BID) | RESPIRATORY_TRACT | Status: DC
  Administered 2016-11-06 – 2016-11-07 (×3): 2 via RESPIRATORY_TRACT
  Filled 2016-11-05: qty 8.8

## 2016-11-05 MED ORDER — IOPAMIDOL (ISOVUE-300) INJECTION 61%
100.0000 mL | Freq: Once | INTRAVENOUS | Status: AC | PRN
  Administered 2016-11-05: 100 mL via INTRAVENOUS

## 2016-11-05 MED ORDER — LATANOPROST 0.005 % OP SOLN
1.0000 [drp] | Freq: Every day | OPHTHALMIC | Status: DC
  Administered 2016-11-06: 1 [drp] via OPHTHALMIC
  Filled 2016-11-05: qty 2.5

## 2016-11-05 MED ORDER — GABAPENTIN 400 MG PO CAPS
400.0000 mg | ORAL_CAPSULE | Freq: Every day | ORAL | Status: DC
  Administered 2016-11-05 – 2016-11-06 (×2): 400 mg via ORAL
  Filled 2016-11-05 (×2): qty 1

## 2016-11-05 MED ORDER — IOPAMIDOL (ISOVUE-300) INJECTION 61%
30.0000 mL | Freq: Once | INTRAVENOUS | Status: AC | PRN
  Administered 2016-11-05: 30 mL via ORAL

## 2016-11-05 MED ORDER — SODIUM CHLORIDE 0.9 % IV SOLN
INTRAVENOUS | Status: AC
  Administered 2016-11-05: 23:00:00 via INTRAVENOUS

## 2016-11-05 MED ORDER — SODIUM CHLORIDE 0.9 % IV SOLN
Freq: Once | INTRAVENOUS | Status: AC
  Administered 2016-11-05: 17:00:00 via INTRAVENOUS

## 2016-11-05 MED ORDER — BRIMONIDINE TARTRATE 0.15 % OP SOLN
1.0000 [drp] | Freq: Two times a day (BID) | OPHTHALMIC | Status: DC
  Administered 2016-11-06 – 2016-11-07 (×3): 1 [drp] via OPHTHALMIC
  Filled 2016-11-05: qty 5

## 2016-11-05 MED ORDER — ONDANSETRON HCL 4 MG/2ML IJ SOLN: 4.0000 mg | Freq: Four times a day (QID) | INTRAMUSCULAR | Status: DC | PRN

## 2016-11-05 MED ORDER — SODIUM CHLORIDE 0.9 % IV SOLN
3.0000 g | Freq: Four times a day (QID) | INTRAVENOUS | Status: DC
  Administered 2016-11-05: 3 g via INTRAVENOUS
  Filled 2016-11-05 (×3): qty 3

## 2016-11-05 MED ORDER — OXYCODONE HCL 5 MG PO TABS: 5.0000 mg | ORAL_TABLET | ORAL | Status: DC | PRN

## 2016-11-05 NOTE — ED Triage Notes (Signed)
C/o feeling weak since last night.  Also c/o fever today 101.4.

## 2016-11-05 NOTE — ED Notes (Signed)
D/c sepsis protocol.

## 2016-11-05 NOTE — ED Notes (Signed)
Patient has finished oral contrast. CT aware.

## 2016-11-05 NOTE — ED Notes (Signed)
Dr. Cinda Quest informed of Troponin of 0.03

## 2016-11-05 NOTE — ED Notes (Signed)
Patient left for CT.

## 2016-11-05 NOTE — Progress Notes (Signed)
Pharmacy Antibiotic Note  [name] is a 76 y.o. male admitted on 11/05/2016 with intrabdominal infection.  Pharmacy has been consulted for zosyn dosing.  Plan: Zosyn 3.375g IV q8h (4 hour infusion).  Height: 6\' 2"  (188 cm) Weight: 175 lb (79.4 kg) IBW/kg (Calculated) : 82.2  Temp (24hrs), Avg:99.4 F (37.4 C), Min:98.5 F (36.9 C), Max:100.5 F (38.1 C)   Recent Labs Lab 11/05/16 1433 11/05/16 1652  WBC 13.1*  --   CREATININE 1.24 0.95  LATICACIDVEN  --  1.8    Estimated Creatinine Clearance: 72 mL/min (by C-G formula based on SCr of 0.95 mg/dL).    Allergies  Allergen Reactions  . Timolol Maleate [Timolol Maleate] Shortness Of Breath  . Bactrim [Sulfamethoxazole-Trimethoprim]     Pt says "it takes his skin pigment off  . Budesonide-Formoterol Fumarate Other (See Comments)    Patient reports dizziness and trembling/shaky feeling.  . Lisinopril     cough     Thank you for allowing pharmacy to be a part of this patient's care.  Tobie Lords, PharmD, BCPS Clinical Pharmacist 11/05/2016

## 2016-11-05 NOTE — ED Provider Notes (Signed)
[facility] Emergency Department Provider Note   ____________________________________________   First MD Initiated Contact with Patient 11/05/16 1619     (approximate)  I have reviewed the triage vital signs and the nursing notes.   HISTORY  Chief Complaint Weakness  HPI [name] is a 76 y.o. male who reports feeling weak since last night. He developed abdominal pain in the left lower quadrant last night. It has since improved. It was severe as now is only moderate to mild. Pain is dull and achy  Feeling. Feels like his diverticulitis. He was running a fever last night and today. Blood pressure was somewhat low. Pain is worse with percussion. Pain was severe now as moderate to mild. Patient was somewhat nauseated but has not vomited has not had any stool   Past Medical History:  Diagnosis Date  . AB (asthmatic bronchitis)   . Allergic rhinitis   . Arthropathy   . Asthma   . Cellulitis   . DVT (deep venous thrombosis) ([place])   . Foreign body in skin   . Glaucoma   . Hx MRSA infection   . Hx MRSA infection   . Hypertension   . Insomnia   . Insomnia   . Mitral valve disorder   . Osteoarthrosis   . Upper back strain     Patient Active Problem List   Diagnosis Date Noted  . Sepsis ([place]) 11/05/2016  . Diverticulitis 11/05/2016  . History of deep vein thrombosis (DVT) of lower extremity 11/05/2016  . HCAP (healthcare-associated pneumonia) 08/29/2016  . NSTEMI (non-ST elevated myocardial infarction) ([place]) 08/18/2016  . Chronic obstructive pulmonary disease ([place]) 01/02/2016  . DVT, femoral, acute ([place]) 05/15/2015  . Recurrent pneumonia 12/21/2014  . Pneumothorax, right 11/04/2014  . Basal cell carcinoma 10/19/2014  . Abnormal liver enzymes 10/19/2014  . Personal history of methicillin resistant Staphylococcus aureus 10/19/2014  . Cannot sleep 10/19/2014  . Heel pain 10/19/2014  . Pain in shoulder 10/19/2014  . Back strain 10/19/2014  .  Pulmonary aspergillosis, allergic bronchopulmonary type ([place]) 07/30/2014  . ABPA (allergic bronchopulmonary aspergillosis) ([place]) 07/30/2014  . CAP (community acquired pneumonia) 05/03/2014  . Abnormal CXR 04/01/2014  . Paroxysmal tachycardia ([place]) 06/23/2013  . Asymptomatic PVCs 06/23/2013  . Beat, premature ventricular 06/23/2013  . Cough 10/19/2012  . Allergic rhinitis 01/17/2009  . Asthma, chronic obstructive, without status asthmaticus ([place]) 11/04/2008  . CAFL (chronic airflow limitation) ([place]) 11/04/2008  . Essential hypertension 02/09/2008  . Arthropathia 08/28/2007  . Disorder of mitral valve 07/08/2005  . Benign prostatic hypertrophy without urinary obstruction 05/16/1997  . LBP (low back pain) 05/16/1997  . Degenerative arthritis of hip 03/18/1995    Past Surgical History:  Procedure Laterality Date  . APPENDECTOMY    . BRONCHOSCOPY    . LEFT HEART CATH AND CORONARY ANGIOGRAPHY N/A 08/19/2016   Procedure: Left Heart Cath and Coronary Angiography;  Surgeon: Dionisio David, MD;  Location: [facility];  Service: Cardiovascular;  Laterality: N/A;  . NASAL SINUS SURGERY  09/27/2014    Prior to Admission medications   Medication Sig Start Date End Date Taking? Authorizing Provider  ADVAIR HFA 230-21 MCG/ACT inhaler Inhale 2 puffs into the lungs 2 (two) times daily.  11/17/15  Yes [provider]  aspirin EC 81 MG EC tablet Take 1 tablet (81 mg total) by mouth daily. 08/21/16  Yes Wieting, Richard, MD  atorvastatin (LIPITOR) 40 MG tablet Take 1 tablet (40 mg total) by mouth daily  at 6 PM. 09/10/16  Yes Jerrol Banana., MD  brimonidine [facility]) 0.15 % ophthalmic solution Place 1 drop into both eyes 2 (two) times daily.  10/26/15  Yes [provider]  Calcium Carb-Cholecalciferol (CALCIUM 600 + D PO) Take 1 tablet by mouth 2 (two) times daily.   Yes [provider]  diclofenac sodium (VOLTAREN) 1 % GEL Apply 2 g topically 4 (four) times daily.    Yes [provider]  diltiazem (CARDIZEM CD) 180 MG 24 hr capsule Take 1 capsule (180 mg total) by mouth daily. 09/10/16  Yes Jerrol Banana., MD  dorzolamide (TRUSOPT) 2 % ophthalmic solution Place 1 drop into both eyes 2 (two) times daily.  08/31/15  Yes [provider]  fluticasone (FLONASE) 50 MCG/ACT nasal spray 2 sprays by Each Nare route Two (2) times a day. 03/27/16 03/27/17 Yes [provider]  gabapentin (NEURONTIN) 400 MG capsule Take 400 mg by mouth at bedtime.  09/18/15  Yes [provider]  isosorbide mononitrate (IMDUR) 30 MG 24 hr tablet Take 1 tablet (30 mg total) by mouth daily. 09/10/16  Yes Jerrol Banana., MD  latanoprost (XALATAN) 0.005 % ophthalmic solution Place 1 drop into both eyes at bedtime.    Yes [provider]  Multiple Vitamin (MULTIVITAMIN) tablet Take 1 tablet by mouth daily.   Yes [provider]  tiotropium (SPIRIVA HANDIHALER) 18 MCG inhalation capsule Place 18 mcg into inhaler and inhale daily.  02/06/15 08/18/17 Yes [provider]  XARELTO 20 MG TABS tablet Take 20 mg by mouth.  08/05/16  Yes [provider]    Allergies Timolol maleate [timolol maleate]; Bactrim [sulfamethoxazole-trimethoprim]; Budesonide-formoterol fumarate; and Lisinopril  Family History  Problem Relation Age of Onset  . Breast cancer Sister   . Heart failure Mother   . Pancreatic cancer Sister   . Alzheimer's disease Father   . Brain cancer Daughter     Social History Social History  Substance Use Topics  . Smoking status: Former Smoker    Packs/day: 0.50    Years: 3.00    Types: Cigarettes    Quit date: 06/17/1958  . Smokeless tobacco: Never Used     Comment: smoking while in college  . Alcohol use No     Comment: OCCASSIONAL WINE ONLY    Review of Systems  Constitutional:  fever/chills Eyes: No visual changes. ENT: No sore throat. Cardiovascular: Denies chest pain. Respiratory: Denies  shortness of breath. Gastrointestinal: See history of present illness Genitourinary: Negative for dysuria. Musculoskeletal: Negative for back pain. Skin: Negative for rash. Neurological: Negative for headaches, focal weakness or numbness.   ____________________________________________   PHYSICAL EXAM:  VITAL SIGNS: ED Triage Vitals  Enc Vitals Group     BP 11/05/16 1431 (!) 114/53     Pulse Rate 11/05/16 1431 97     Resp 11/05/16 1431 16     Temp 11/05/16 1431 99.3 F (37.4 C)     Temp Source 11/05/16 1431 Oral     SpO2 11/05/16 1431 94 %     Weight 11/05/16 1432 175 lb (79.4 kg)     Height 11/05/16 1432 6\' 2"  (1.88 m)     Head Circumference --      Peak Flow --      Pain Score 11/05/16 1431 0     Pain Loc --      Pain Edu? --      Excl. in [place]? --  Constitutional: Alert and oriented. ill appearing and in no acute distress. Eyes: Conjunctivae are normal. PERRL. EOMI. Head: Atraumatic. Nose: No congestion/rhinnorhea. Mouth/Throat: Mucous membranes are moist.  Oropharynx non-erythematous. Neck: No stridor. Cardiovascular: Normal rate, regular rhythm. Grossly normal heart sounds.  Good peripheral circulation. Respiratory: Normal respiratory effort.  No retractions. Lungs CTAB. Gastrointestinal: Soft tender to palpation percussion in the left lower quadrant decreased bowel sounds No distention. No abdominal bruits. No CVA tenderness. Musculoskeletal: No lower extremity tenderness nor edema.  No joint effusions. Neurologic:  Normal speech and language. No gross focal neurologic deficits are appreciated.  Skin:  Skin is warm, dry and intact. No rash noted. Psychiatric: Mood and affect are normal. Speech and behavior are normal.  ____________________________________________   LABS (all labs ordered are listed, but only abnormal results are displayed)  Labs Reviewed  BASIC METABOLIC PANEL - Abnormal; Notable for the following:       Result Value   Sodium 134 (*)     Glucose, Bld 112 (*)    GFR calc non Af Amer 54 (*)    Anion gap 4 (*)    All other components within normal limits  CBC - Abnormal; Notable for the following:    WBC 13.1 (*)    RDW 14.8 (*)    All other components within normal limits  URINALYSIS, COMPLETE (UACMP) WITH MICROSCOPIC - Abnormal; Notable for the following:    Color, Urine YELLOW (*)    APPearance CLEAR (*)    All other components within normal limits  COMPREHENSIVE METABOLIC PANEL - Abnormal; Notable for the following:    Calcium 8.5 (*)    Total Protein 6.1 (*)    Total Bilirubin 1.6 (*)    All other components within normal limits  TROPONIN I - Abnormal; Notable for the following:    Troponin I 0.03 (*)    All other components within normal limits  DIFFERENTIAL - Abnormal; Notable for the following:    Neutro Abs 9.9 (*)    Monocytes Absolute 1.2 (*)    All other components within normal limits  URINE CULTURE  CULTURE, BLOOD (ROUTINE X 2)  CULTURE, BLOOD (ROUTINE X 2)  LACTIC ACID, PLASMA  BRAIN NATRIURETIC PEPTIDE  LACTIC ACID, PLASMA  URINALYSIS, ROUTINE W REFLEX MICROSCOPIC  CBG MONITORING, ED   ____________________________________________  EKG   ____________________________________________  RADIOLOGY  Study Result   CLINICAL DATA:  Fever and weakness.  EXAM: CHEST  2 VIEW  COMPARISON:  09/05/2016  FINDINGS: The cardiomediastinal silhouette is within normal limits. The lungs remain hyperinflated with minimal chronic scarring in the lung bases. No acute airspace consolidation, edema, pleural effusion, or pneumothorax is identified. Thoracic spondylosis is noted.  IMPRESSION: No active cardiopulmonary disease.   Electronically Signed   By: Logan Bores M.D.   On: 11/05/2016 15:08    Study Result   CLINICAL DATA:  76 y/o M; left lower quadrant pain. Fever and weakness. Code sepsis.  EXAM: CT ABDOMEN AND PELVIS WITH CONTRAST  TECHNIQUE: Multidetector CT imaging of the  abdomen and pelvis was performed using the standard protocol following bolus administration of intravenous contrast.  CONTRAST:  122mL ISOVUE-300 IOPAMIDOL (ISOVUE-300) INJECTION 61%  COMPARISON:  None.  FINDINGS: Lower chest: Small focus consolidation within the left dependent lung base (series 4, image 13).  Hepatobiliary: No focal liver abnormality is seen. No gallstones, gallbladder wall thickening, or biliary dilatation.  Pancreas: Unremarkable. No pancreatic ductal dilatation or surrounding inflammatory changes.  Spleen: Normal in size without  focal abnormality.  Adrenals/Urinary Tract: Adrenal glands are unremarkable. Kidneys are normal, without renal calculi, focal lesion, or hydronephrosis. Bladder is unremarkable.  Stomach/Bowel: Stomach is within normal limits. Appendix not identified. Extensive diverticulosis of sigmoid colon. Segment of sigmoid colon within the left lower quadrant demonstrates wall thickening and surrounding inflammatory changes as well as a small volume of left lower quadrant ascites compatible with acute focal colitis/diverticulitis. No pericolonic abscess is identified.  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 Moderate predominantly fibrofatty plaque of the aorta and bilateral common iliac artery's. Right proximal common iliac artery plaque ulceration incidentally noted (series 2, image 53).  Reproductive: Moderate prostate enlargement.  Other: Small hiatal hernia.  Musculoskeletal: Multilevel degenerative changes of the spine greatest at L4-5 where there is moderate disc space narrowing. No acute osseous abnormality.  IMPRESSION: 1. Short segment of acute colitis/diverticulitis of sigmoid colon in the left lower quadrant. Small volume of left lower quadrant ascites. No discrete abscess. 2. Small area of consolidation in the dependent left lower lobe may represent pneumonia or  atelectasis. 3. 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 4. Aortic atherosclerosis with predominantly fibrofatty plaque.   Electronically Signed   By: Kristine Garbe M.D.   On: 11/05/2016 20:13     ____________________________________________   PROCEDURES  Procedure(s) performed:   Procedures  Critical Care performed:   ____________________________________________   INITIAL IMPRESSION / ASSESSMENT AND PLAN / ED COURSE  Pertinent labs & imaging results that were available during my care of the patient were reviewed by me and considered in my medical decision making (see chart for details).  Discussed patient with Dr. Hinton Lovely he says the aneurysm requires yearly ultrasounds otherwise nothing to do with it until it reaches 25-30 mm.      ____________________________________________   FINAL CLINICAL IMPRESSION(S) / ED DIAGNOSES  Final diagnoses:  Diverticulitis   Also fever and episode of hypotension now resolved   NEW MEDICATIONS STARTED DURING THIS VISIT:  New Prescriptions   No medications on file     Note:  This document was prepared using Dragon voice recognition software and may include unintentional dictation errors.    Nena Polio, MD 11/05/16 2151

## 2016-11-05 NOTE — H&P (Signed)
[facility] [place] NAME: [name]    MR#:  [id number]  DATE OF BIRTH:  1941/05/14  DATE OF ADMISSION:  11/05/2016  PRIMARY CARE PHYSICIAN: Jerrol Banana., MD   REQUESTING/REFERRING PHYSICIAN: Cinda Quest, MD  CHIEF COMPLAINT:   Chief Complaint  Patient presents with  . Weakness    HISTORY OF PRESENT ILLNESS:  [name]  is a 76 y.o. male who presents with lower quadrant abdominal pain and weakness for the past couple of days, as well as fever today.  Patient has a history of diverticulitis and states he felt like that is what he had again this time.  CT imaging here was consistent with the same.  He met sepsis criteria, and hospitalists were called for admission  PAST MEDICAL HISTORY:   Past Medical History:  Diagnosis Date  . AB (asthmatic bronchitis)   . Allergic rhinitis   . Arthropathy   . Asthma   . Cellulitis   . DVT (deep venous thrombosis) ([place])   . Foreign body in skin   . Glaucoma   . Hx MRSA infection   . Hx MRSA infection   . Hypertension   . Insomnia   . Insomnia   . Mitral valve disorder   . Osteoarthrosis   . Upper back strain     PAST SURGICAL HISTORY:   Past Surgical History:  Procedure Laterality Date  . APPENDECTOMY    . BRONCHOSCOPY    . LEFT HEART CATH AND CORONARY ANGIOGRAPHY N/A 08/19/2016   Procedure: Left Heart Cath and Coronary Angiography;  Surgeon: Dionisio , MD;  Location: [facility];  Service: Cardiovascular;  Laterality: N/A;  . NASAL SINUS SURGERY  09/27/2014    SOCIAL HISTORY:   Social History  Substance Use Topics  . Smoking status: Former Smoker    Packs/day: 0.50    Years: 3.00    Types: Cigarettes    Quit date: 06/17/1958  . Smokeless tobacco: Never Used     Comment: smoking while in college  . Alcohol use No     Comment: OCCASSIONAL WINE ONLY    FAMILY HISTORY:   Family History  Problem Relation Age of Onset  . Breast cancer Sister   .  Heart failure Mother   . Pancreatic cancer Sister   . Alzheimer's disease Father   . Brain cancer Daughter     DRUG ALLERGIES:   Allergies  Allergen Reactions  . Timolol Maleate [Timolol Maleate] Shortness Of Breath  . Bactrim [Sulfamethoxazole-Trimethoprim]     Pt says "it takes his skin pigment off  . Budesonide-Formoterol Fumarate Other (See Comments)    Patient reports dizziness and trembling/shaky feeling.  . Lisinopril     cough    MEDICATIONS AT HOME:   Prior to Admission medications   Medication Sig Start Date End Date Taking? Authorizing Provider  ADVAIR HFA 230-21 MCG/ACT inhaler Inhale 2 puffs into the lungs 2 (two) times daily.  11/17/15  Yes [provider]  aspirin EC 81 MG EC tablet Take 1 tablet (81 mg total) by mouth daily. 08/21/16  Yes Loletha Grayer, MD  atorvastatin (LIPITOR) 40 MG tablet Take 1 tablet (40 mg total) by mouth daily at 6 PM. 09/10/16  Yes Jerrol Banana., MD  brimonidine [facility]) 0.15 % ophthalmic solution Place 1 drop into both eyes 2 (two) times daily.  10/26/15  Yes [provider]  Calcium Carb-Cholecalciferol (CALCIUM 600 + D  PO) Take 1 tablet by mouth 2 (two) times daily.   Yes [provider]  diclofenac sodium (VOLTAREN) 1 % GEL Apply 2 g topically 4 (four) times daily.   Yes [provider]  diltiazem (CARDIZEM CD) 180 MG 24 hr capsule Take 1 capsule (180 mg total) by mouth daily. 09/10/16  Yes Jerrol Banana., MD  dorzolamide (TRUSOPT) 2 % ophthalmic solution Place 1 drop into both eyes 2 (two) times daily.  08/31/15  Yes [provider]  fluticasone (FLONASE) 50 MCG/ACT nasal spray 2 sprays by Each Nare route Two (2) times a day. 03/27/16 03/27/17 Yes [provider]  gabapentin (NEURONTIN) 400 MG capsule Take 400 mg by mouth at bedtime.  09/18/15  Yes [provider]  isosorbide mononitrate (IMDUR) 30 MG 24 hr tablet Take 1 tablet (30 mg total) by mouth daily.  09/10/16  Yes Jerrol Banana., MD  latanoprost (XALATAN) 0.005 % ophthalmic solution Place 1 drop into both eyes at bedtime.    Yes [provider]  Multiple Vitamin (MULTIVITAMIN) tablet Take 1 tablet by mouth daily.   Yes [provider]  tiotropium (SPIRIVA HANDIHALER) 18 MCG inhalation capsule Place 18 mcg into inhaler and inhale daily.  02/06/15 08/18/17 Yes [provider]  XARELTO 20 MG TABS tablet Take 20 mg by mouth.  08/05/16  Yes [provider]    REVIEW OF SYSTEMS:  Review of Systems  Constitutional: Positive for fever and malaise/fatigue. Negative for chills and weight loss.  HENT: Negative for ear pain, hearing loss and tinnitus.   Eyes: Negative for blurred vision, double vision, pain and redness.  Respiratory: Negative for cough, hemoptysis and shortness of breath.   Cardiovascular: Negative for chest pain, palpitations, orthopnea and leg swelling.  Gastrointestinal: Positive for abdominal pain. Negative for constipation, diarrhea, nausea and vomiting.  Genitourinary: Negative for dysuria, frequency and hematuria.  Musculoskeletal: Negative for back pain, joint pain and neck pain.  Skin:       No acne, rash, or lesions  Neurological: Negative for dizziness, tremors and focal weakness.  Endo/Heme/Allergies: Negative for polydipsia. Does not bruise/bleed easily.  Psychiatric/Behavioral: Negative for depression. The patient is not nervous/anxious and does not have insomnia.      VITAL SIGNS:   Vitals:   11/05/16 1731 11/05/16 1942 11/05/16 2029 11/05/16 2109  BP: (!) 127/58 128/86 140/64 (!) 129/57  Pulse: 93 82 89 90  Resp: (!) _0 Temp:      TempSrc:      SpO2: 94% 98% 98% 95%  Weight:      Height:       Wt Readings from Last 3 Encounters:  11/05/16 79.4 kg (175 lb)  10/07/16 77.6 kg (171 lb)  09/05/16 76.7 kg (169 lb)    PHYSICAL EXAMINATION:  Physical Exam  Vitals reviewed. Constitutional: He is oriented to  person, place, and time. He appears well-developed and well-nourished. No distress.  HENT:  Head: Normocephalic and atraumatic.  Mouth/Throat: Oropharynx is clear and moist.  Eyes: Conjunctivae and EOM are normal. Pupils are equal, round, and reactive to light. No scleral icterus.  Neck: Normal range of motion. Neck supple. No JVD present. No thyromegaly present.  Cardiovascular: Normal rate, regular rhythm and intact distal pulses.  Exam reveals no gallop and no friction rub.   No murmur heard. Respiratory: Effort normal and breath sounds normal. No respiratory distress. He has no wheezes. He has no rales.  GI: Soft. Bowel  sounds are normal. He exhibits no distension. There is tenderness (lower quadrants).  Musculoskeletal: Normal range of motion. He exhibits no edema.  No arthritis, no gout  Lymphadenopathy:    He has no cervical adenopathy.  Neurological: He is alert and oriented to person, place, and time. No cranial nerve deficit.  No dysarthria, no aphasia  Skin: Skin is warm and dry. No rash noted. No erythema.  Psychiatric: He has a normal mood and affect. His behavior is normal. Judgment and thought content normal.    LABORATORY PANEL:   CBC  Recent Labs Lab 11/05/16 1433  WBC 13.1*  HGB 16.0  HCT 47.0  PLT 189   ------------------------------------------------------------------------------------------------------------------  Chemistries   Recent Labs Lab 11/05/16 1652  NA 137  K 3.8  CL 107  CO2 24  GLUCOSE 84  BUN 13  CREATININE 0.95  CALCIUM 8.5*  AST 31  ALT 36  ALKPHOS 74  BILITOT 1.6*   ------------------------------------------------------------------------------------------------------------------  Cardiac Enzymes  Recent Labs Lab 11/05/16 1433  TROPONINI 0.03*   ------------------------------------------------------------------------------------------------------------------  RADIOLOGY:  Dg Chest 2 View  Result Date:  11/05/2016 CLINICAL DATA:  Fever and weakness. EXAM: CHEST  2 VIEW COMPARISON:  09/05/2016 FINDINGS: The cardiomediastinal silhouette is within normal limits. The lungs remain hyperinflated with minimal chronic scarring in the lung bases. No acute airspace consolidation, edema, pleural effusion, or pneumothorax is identified. Thoracic spondylosis is noted. IMPRESSION: No active cardiopulmonary disease. Electronically Signed   By: Logan Bores M.D.   On: 11/05/2016 15:08   Ct Abdomen Pelvis W Contrast  Result Date: 11/05/2016 CLINICAL DATA:  76 y/o M; left lower quadrant pain. Fever and weakness. Code sepsis. EXAM: CT ABDOMEN AND PELVIS WITH CONTRAST TECHNIQUE: Multidetector CT imaging of the abdomen and pelvis was performed using the standard protocol following bolus administration of intravenous contrast. CONTRAST:  129m ISOVUE-300 IOPAMIDOL (ISOVUE-300) INJECTION 61% COMPARISON:  None. FINDINGS: Lower chest: Small focus consolidation within the left dependent lung base (series 4, image 13). Hepatobiliary: No focal liver abnormality is seen. No gallstones, gallbladder wall thickening, or biliary dilatation. Pancreas: Unremarkable. No pancreatic ductal dilatation or surrounding inflammatory changes. Spleen: Normal in size without focal abnormality. Adrenals/Urinary Tract: Adrenal glands are unremarkable. Kidneys are normal, without renal calculi, focal lesion, or hydronephrosis. Bladder is unremarkable. Stomach/Bowel: Stomach is within normal limits. Appendix not identified. Extensive diverticulosis of sigmoid colon. Segment of sigmoid colon within the left lower quadrant demonstrates wall thickening and surrounding inflammatory changes as well as a small volume of left lower quadrant ascites compatible with acute focal colitis/diverticulitis. No pericolonic abscess is identified. 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 Moderate predominantly fibrofatty plaque of the  aorta and bilateral common iliac artery's. Right proximal common iliac artery plaque ulceration incidentally noted (series 2, image 53). Reproductive: Moderate prostate enlargement. Other: Small hiatal hernia. Musculoskeletal: Multilevel degenerative changes of the spine greatest at L4-5 where there is moderate disc space narrowing. No acute osseous abnormality. IMPRESSION: 1. Short segment of acute colitis/diverticulitis of sigmoid colon in the left lower quadrant. Small volume of left lower quadrant ascites. No discrete abscess. 2. Small area of consolidation in the dependent left lower lobe may represent pneumonia or atelectasis. 3. 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 4. Aortic atherosclerosis with predominantly fibrofatty plaque. Electronically Signed   By: LKristine GarbeM.D.   On: 11/05/2016 20:13    EKG:   Orders placed or performed during the hospital encounter of 11/05/16  . ED  EKG  . ED EKG  . ED EKG 12-Lead  . ED EKG 12-Lead    IMPRESSION AND PLAN:  Principal Problem:   Sepsis ([place]) - due to diverticulitis, IV antibiotics started, lactate was normal, BP stable, cultures sent Active Problems:   Diverticulitis - antibiotics and cultures as above   Essential hypertension - continue home meds   Asthma, chronic obstructive, without status asthmaticus ([place]) - continue home inhalers   History of deep vein thrombosis (DVT) of lower extremity - continue anticoagulation  All the records are reviewed and case discussed with ED provider. Management plans discussed with the patient and/or family.  DVT PROPHYLAXIS: Systemic anticoagulation  GI PROPHYLAXIS: None  ADMISSION STATUS: Inpatient  CODE STATUS: Full Code Status History    Date Active Date Inactive Code Status Order ID Comments User Context   08/29/2016 10:54 PM 08/31/2016 12:04 PM Full Code [id number]  Gladstone Lighter, MD Inpatient   08/18/2016  4:34 PM 08/20/2016  4:11 PM Full Code [id number]  Vaughan Basta,  MD Inpatient   11/04/2014  7:09 AM 11/09/2014  2:50 PM Full Code [id number]  Molly Maduro, MD ED      TOTAL TIME TAKING CARE OF THIS PATIENT: 45 minutes.   Jannifer Franklin,  [place] 11/05/2016, 9:31 PM  [facility]  Office  [phone]  CC: Primary care physician; Jerrol Banana., MD  Note:  This document was prepared using Dragon voice recognition software and may include unintentional dictation errors.

## 2016-11-05 NOTE — ED Notes (Signed)
CODE SEPSIS CALLED TO [facility] AT [facility] 

## 2016-11-06 LAB — BASIC METABOLIC PANEL
ANION GAP: 6 (ref 5–15)
BUN: 15 mg/dL (ref 6–20)
CALCIUM: 8 mg/dL — AB (ref 8.9–10.3)
CO2: 24 mmol/L (ref 22–32)
Chloride: 103 mmol/L (ref 101–111)
Creatinine, Ser: 1.14 mg/dL (ref 0.61–1.24)
GFR calc non Af Amer: 60 mL/min — ABNORMAL LOW (ref >60)
Glucose, Bld: 84 mg/dL (ref 65–99)
POTASSIUM: 3.4 mmol/L — AB (ref 3.5–5.1)
Sodium: 133 mmol/L — ABNORMAL LOW (ref 135–145)

## 2016-11-06 LAB — CBC
HCT: 43 % (ref 40.0–52.0)
HEMOGLOBIN: 14.6 g/dL (ref 13.0–18.0)
MCH: 32.9 pg (ref 26.0–34.0)
MCHC: 33.8 g/dL (ref 32.0–36.0)
MCV: 97.2 fL (ref 80.0–100.0)
Platelets: 157 10*3/uL (ref 150–440)
RBC: 4.42 MIL/uL (ref 4.40–5.90)
RDW: 14.6 % — ABNORMAL HIGH (ref 11.5–14.5)
WBC: 14 10*3/uL — ABNORMAL HIGH (ref 3.8–10.6)

## 2016-11-06 LAB — URINE CULTURE: Culture: NO GROWTH

## 2016-11-06 MED ORDER — POTASSIUM CHLORIDE CRYS ER 20 MEQ PO TBCR
20.0000 meq | EXTENDED_RELEASE_TABLET | Freq: Two times a day (BID) | ORAL | Status: DC
  Administered 2016-11-06 – 2016-11-07 (×3): 20 meq via ORAL
  Filled 2016-11-06 (×4): qty 1

## 2016-11-06 NOTE — Progress Notes (Signed)
[facility] [place] NAME: [name]    MR#:  [id number]  DATE OF BIRTH:  July 01, 1940  SUBJECTIVE:   Patient here due to abdominal pain and noted to have acute diverticulitis. Abdominal pain has improved, no nausea or vomiting. Did have a fever of 101 earlier. No other complaints. Wife at bedside  REVIEW OF SYSTEMS:    Review of Systems  Constitutional: Positive for fever.    Nutrition: Heart Healthy Tolerating Diet: Yes Tolerating PT: ambulatory     DRUG ALLERGIES:   Allergies  Allergen Reactions  . Timolol Maleate [Timolol Maleate] Shortness Of Breath  . Bactrim [Sulfamethoxazole-Trimethoprim]     Pt says "it takes his skin pigment off  . Budesonide-Formoterol Fumarate Other (See Comments)    Patient reports dizziness and trembling/shaky feeling.  . Lisinopril     cough    VITALS:  Blood pressure (!) 106/48, pulse 88, temperature 100 F (37.8 C), temperature source Oral, resp. rate 18, height 6\' 2"  (1.88 m), weight 79.4 kg (175 lb), SpO2 95 %.  PHYSICAL EXAMINATION:   Physical Exam  GENERAL:  76 y.o.-year-old patient lying in bed in no acute distress.  EYES: Pupils equal, round, reactive to light and accommodation. No scleral icterus. Extraocular muscles intact.  HEENT: Head atraumatic, normocephalic. Oropharynx and nasopharynx clear.  NECK:  Supple, no jugular venous distention. No thyroid enlargement, no tenderness.  LUNGS: Normal breath sounds bilaterally, no wheezing, rales, rhonchi. No use of accessory muscles of respiration.  CARDIOVASCULAR: S1, S2 normal. No murmurs, rubs, or gallops.  ABDOMEN: Soft, nontender, nondistended. Bowel sounds present. No organomegaly or mass.  EXTREMITIES: No cyanosis, clubbing or edema b/l.    NEUROLOGIC: Cranial nerves II through XII are intact. No focal Motor or sensory deficits b/l.   PSYCHIATRIC: The patient is alert and oriented x 3.  SKIN: No obvious rash, lesion, or ulcer.     LABORATORY PANEL:   CBC  Recent Labs Lab 11/06/16 0519  WBC 14.0*  HGB 14.6  HCT 43.0  PLT 157   ------------------------------------------------------------------------------------------------------------------  Chemistries   Recent Labs Lab 11/05/16 1652 11/06/16 0519  NA 137 133*  K 3.8 3.4*  CL 107 103  CO2 24 24  GLUCOSE 84 84  BUN 13 15  CREATININE 0.95 1.14  CALCIUM 8.5* 8.0*  AST 31  --   ALT 36  --   ALKPHOS 74  --   BILITOT 1.6*  --    ------------------------------------------------------------------------------------------------------------------  Cardiac Enzymes  Recent Labs Lab 11/05/16 1433  TROPONINI 0.03*   ------------------------------------------------------------------------------------------------------------------  RADIOLOGY:  Dg Chest 2 View  Result Date: 11/05/2016 CLINICAL DATA:  Fever and weakness. EXAM: CHEST  2 VIEW COMPARISON:  09/05/2016 FINDINGS: The cardiomediastinal silhouette is within normal limits. The lungs remain hyperinflated with minimal chronic scarring in the lung bases. No acute airspace consolidation, edema, pleural effusion, or pneumothorax is identified. Thoracic spondylosis is noted. IMPRESSION: No active cardiopulmonary disease. Electronically Signed   By: Logan Bores M.D.   On: 11/05/2016 15:08   Ct Abdomen Pelvis W Contrast  Result Date: 11/05/2016 CLINICAL DATA:  76 y/o M; left lower quadrant pain. Fever and weakness. Code sepsis. EXAM: CT ABDOMEN AND PELVIS WITH CONTRAST TECHNIQUE: Multidetector CT imaging of the abdomen and pelvis was performed using the standard protocol following bolus administration of intravenous contrast. CONTRAST:  136mL ISOVUE-300 IOPAMIDOL (ISOVUE-300) INJECTION 61% COMPARISON:  None. FINDINGS: Lower chest: Small focus consolidation within the left dependent lung base (series  4, image 13). Hepatobiliary: No focal liver abnormality is seen. No gallstones, gallbladder wall  thickening, or biliary dilatation. Pancreas: Unremarkable. No pancreatic ductal dilatation or surrounding inflammatory changes. Spleen: Normal in size without focal abnormality. Adrenals/Urinary Tract: Adrenal glands are unremarkable. Kidneys are normal, without renal calculi, focal lesion, or hydronephrosis. Bladder is unremarkable. Stomach/Bowel: Stomach is within normal limits. Appendix not identified. Extensive diverticulosis of sigmoid colon. Segment of sigmoid colon within the left lower quadrant demonstrates wall thickening and surrounding inflammatory changes as well as a small volume of left lower quadrant ascites compatible with acute focal colitis/diverticulitis. No pericolonic abscess is identified. 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 Moderate predominantly fibrofatty plaque of the aorta and bilateral common iliac artery's. Right proximal common iliac artery plaque ulceration incidentally noted (series 2, image 53). Reproductive: Moderate prostate enlargement. Other: Small hiatal hernia. Musculoskeletal: Multilevel degenerative changes of the spine greatest at L4-5 where there is moderate disc space narrowing. No acute osseous abnormality. IMPRESSION: 1. Short segment of acute colitis/diverticulitis of sigmoid colon in the left lower quadrant. Small volume of left lower quadrant ascites. No discrete abscess. 2. Small area of consolidation in the dependent left lower lobe may represent pneumonia or atelectasis. 3. 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 4. Aortic atherosclerosis with predominantly fibrofatty plaque. Electronically Signed   By: Kristine Garbe M.D.   On: 11/05/2016 20:13     ASSESSMENT AND PLAN:   76 year old male with past medical history of hypertension, asthma, osteoarthritis, recent history of an non-ST elevation MI, pneumonia who presented to the hospital due to abdominal pain and weakness and noted to have acute  diverticulitis.  1. Acute diverticulitis-positive patient's abdominal pain and weakness. CT scan abdomen pelvis showing acute colitis and diverticulitis of the sigmoid colon. No abscess noted. -Continue supportive care with IV Zosyn, antiemetics and pain control. Patient is improving.  2. History of chronic atrial fibrillation-rate controlled. Continue Cardizem, continue Xarelto.  3. History of coronary artery disease with recent non-ST elevation MI - no acute chest pain. Continue aspirin, statin, Imdur.  4. COPD-continue Dulera, Spiriva, no acute exacerbation.  5. Glaucoma-continue latanoprost, dorzolamide eyedrops.     All the records are reviewed and case discussed with Care Management/Social Worker. Management plans discussed with the patient, family and they are in agreement.  CODE STATUS: Full code  DVT Prophylaxis: Xarelto  TOTAL TIME TAKING CARE OF THIS PATIENT: 30 minutes.   POSSIBLE D/C IN 1-2 DAYS, DEPENDING ON CLINICAL CONDITION.   Henreitta Leber M.D on 11/06/2016 at 2:28 PM  Between 7am to 6pm - Pager - [phone]  After 6pm go to [email or web address] - password [email or web address] [facility]  Office  [phone]  CC: Primary care physician; Jerrol Banana., MD

## 2016-11-07 ENCOUNTER — Telehealth: Payer: Self-pay

## 2016-11-07 LAB — CBC
HEMATOCRIT: 43.1 % (ref 40.0–52.0)
Hemoglobin: 14.7 g/dL (ref 13.0–18.0)
MCH: 33 pg (ref 26.0–34.0)
MCHC: 34.1 g/dL (ref 32.0–36.0)
MCV: 96.9 fL (ref 80.0–100.0)
Platelets: 150 10*3/uL (ref 150–440)
RBC: 4.45 MIL/uL (ref 4.40–5.90)
RDW: 14.6 % — AB (ref 11.5–14.5)
WBC: 8.9 10*3/uL (ref 3.8–10.6)

## 2016-11-07 LAB — POTASSIUM: Potassium: 3.8 mmol/L (ref 3.5–5.1)

## 2016-11-07 MED ORDER — AMOXICILLIN-POT CLAVULANATE 875-125 MG PO TABS: 1.0000 | ORAL_TABLET | Freq: Two times a day (BID) | ORAL | 0 refills | Status: AC

## 2016-11-07 NOTE — Telephone Encounter (Signed)
Can you work patient in next week? Or could he be seen by another provider? Please advise. Thanks!

## 2016-11-07 NOTE — Discharge Instructions (Signed)

## 2016-11-07 NOTE — Progress Notes (Signed)
Pt A and O x 4. VSS. Pt tolerating diet well. No complaints of pain or nausea. IV removed intact, prescriptions given. Pt voiced understanding of discharge instructions with no further questions. Pt discharged via wheelchair with nursing student.

## 2016-11-07 NOTE — Telephone Encounter (Signed)
Patient is being discharged from [facility] today and needs a follow up in one week. Please advise if pt can be seen by Dr. Rosanna Randy or another provider. Patient was admitted on 11/05/16 due to sepsis. CB# [phone].

## 2016-11-07 NOTE — Care Management (Signed)
Patient Admitted with diverticulitis.  Patient discharged home with wife today.  No RNCM needs identified at discharge.

## 2016-11-07 NOTE — Care Management Important Message (Signed)
Important Message  Patient Details  Name: [name] MRN: [id number] Date of Birth: 08-08-1940   Medicare Important Message Given:  N/A - LOS <3 / Initial given by admissions    Beverly Sessions, RN 11/07/2016, 12:01 PM

## 2016-11-07 NOTE — Discharge Summary (Signed)
[facility] NAME: [name]    MR#:  [id number]  DATE OF BIRTH:  1941-03-11  DATE OF ADMISSION:  11/05/2016 ADMITTING PHYSICIAN: Lance Coon, MD  DATE OF DISCHARGE: 11/07/2016 12:12 PM  PRIMARY CARE PHYSICIAN: Jerrol Banana., MD    ADMISSION DIAGNOSIS:  Diverticulitis [K57.92]  DISCHARGE DIAGNOSIS:  Principal Problem:   Sepsis ([place]) Active Problems:   Essential hypertension   Asthma, chronic obstructive, without status asthmaticus (HCC)   Diverticulitis   History of deep vein thrombosis (DVT) of lower extremity   SECONDARY DIAGNOSIS:   Past Medical History:  Diagnosis Date  . AB (asthmatic bronchitis)   . Allergic rhinitis   . Arthropathy   . Asthma   . Cellulitis   . DVT (deep venous thrombosis) ([place])   . Foreign body in skin   . Glaucoma   . Hx MRSA infection   . Hx MRSA infection   . Hypertension   . Insomnia   . Insomnia   . Mitral valve disorder   . Osteoarthrosis   . Upper back strain     HOSPITAL COURSE:   76 year old male with past medical history of hypertension, asthma, osteoarthritis, recent history of an non-ST elevation MI, pneumonia who presented to the hospital due to abdominal pain and weakness and noted to have acute diverticulitis.  1. Acute diverticulitis-This was the cause of patient's abdominal pain and weakness. Patient CT scan was positive for colitis/sigmoid diverticulitis. Patient was admitted to the hospital and treated supportively with IV fluids, antibiotics, IV antibiotics with Zosyn. -After 2 days of IV antibiotics patient's abdominal pain has improved. He's had no fevers, his white cell count has normalized. He denies any further abdominal pain nausea vomiting. He's therefore being discharged on oral Augmentin for another additional 7 days.  2. History of chronic atrial fibrillation- he remained rate controlled. He will Continue Cardizem, continue Xarelto.  3. History  of coronary artery disease with recent non-ST elevation MI - no acute chest pain. He will Continue aspirin, statin, Imdur.  4. COPD- he will continue his Advair, Spiriva, no acute exacerbation.  5. Glaucoma-he will continue latanoprost, dorzolamide eyedrops.  DISCHARGE CONDITIONS:   Stable.   CONSULTS OBTAINED:    DRUG ALLERGIES:   Allergies  Allergen Reactions  . Timolol Maleate [Timolol Maleate] Shortness Of Breath  . Bactrim [Sulfamethoxazole-Trimethoprim]     Pt says "it takes his skin pigment off  . Budesonide-Formoterol Fumarate Other (See Comments)    Patient reports dizziness and trembling/shaky feeling.  . Lisinopril     cough    DISCHARGE MEDICATIONS:   Allergies as of 11/07/2016      Reactions   Timolol Maleate [timolol Maleate] Shortness Of Breath   Bactrim [sulfamethoxazole-trimethoprim]    Pt says "it takes his skin pigment off   Budesonide-formoterol Fumarate Other (See Comments)   Patient reports dizziness and trembling/shaky feeling.   Lisinopril    cough      Medication List    TAKE these medications   ADVAIR HFA 230-21 MCG/ACT inhaler Generic drug:  fluticasone-salmeterol Inhale 2 puffs into the lungs 2 (two) times daily.   amoxicillin-clavulanate 875-125 MG tablet Commonly known as:  AUGMENTIN Take 1 tablet by mouth 2 (two) times daily.   aspirin 81 MG EC tablet Take 1 tablet (81 mg total) by mouth daily.   atorvastatin 40 MG tablet Commonly known as:  LIPITOR Take 1 tablet (40 mg total) by mouth daily at  6 PM.   brimonidine 0.15 % ophthalmic solution Commonly known as:  ALPHAGAN Place 1 drop into both eyes 2 (two) times daily.   CALCIUM 600 + D PO Take 1 tablet by mouth 2 (two) times daily.   diclofenac sodium 1 % Gel Commonly known as:  VOLTAREN Apply 2 g topically 4 (four) times daily.   diltiazem 180 MG 24 hr capsule Commonly known as:  CARDIZEM CD Take 1 capsule (180 mg total) by mouth daily.   dorzolamide 2 %  ophthalmic solution Commonly known as:  TRUSOPT Place 1 drop into both eyes 2 (two) times daily.   fluticasone 50 MCG/ACT nasal spray Commonly known as:  FLONASE 2 sprays by Each Nare route Two (2) times a day.   gabapentin 400 MG capsule Commonly known as:  NEURONTIN Take 400 mg by mouth at bedtime.   isosorbide mononitrate 30 MG 24 hr tablet Commonly known as:  IMDUR Take 1 tablet (30 mg total) by mouth daily.   latanoprost 0.005 % ophthalmic solution Commonly known as:  XALATAN Place 1 drop into both eyes at bedtime.   multivitamin tablet Take 1 tablet by mouth daily.   SPIRIVA HANDIHALER 18 MCG inhalation capsule Generic drug:  tiotropium Place 18 mcg into inhaler and inhale daily.   XARELTO 20 MG Tabs tablet Generic drug:  rivaroxaban Take 20 mg by mouth.         DISCHARGE INSTRUCTIONS:   DIET:  Cardiac diet  DISCHARGE CONDITION:  Stable  ACTIVITY:  Activity as tolerated  OXYGEN:  Home Oxygen: No.   Oxygen Delivery: room air  DISCHARGE LOCATION:  home   If you experience worsening of your admission symptoms, develop shortness of breath, life threatening emergency, suicidal or homicidal thoughts you must seek medical attention immediately by calling 911 or calling your MD immediately  if symptoms less severe.  You Must read complete instructions/literature along with all the possible adverse reactions/side effects for all the Medicines you take and that have been prescribed to you. Take any new Medicines after you have completely understood and accpet all the possible adverse reactions/side effects.   Please note  You were cared for by a hospitalist during your hospital stay. If you have any questions about your discharge medications or the care you received while you were in the hospital after you are discharged, you can call the unit and asked to speak with the hospitalist on call if the hospitalist that took care of you is not available. Once you  are discharged, your primary care physician will handle any further medical issues. Please note that NO REFILLS for any discharge medications will be authorized once you are discharged, as it is imperative that you return to your primary care physician (or establish a relationship with a primary care physician if you do not have one) for your aftercare needs so that they can reassess your need for medications and monitor your lab values.     Today   No further abdominal pain, N/V. Afebrile and feels better. Wife at bedside and will d/c home today.   VITAL SIGNS:  Blood pressure 116/61, pulse 75, temperature 98 F (36.7 C), temperature source Oral, resp. rate (!) 22, height 6\' 2"  (1.88 m), weight 79.4 kg (175 lb), SpO2 96 %.  I/O:   Intake/Output Summary (Last 24 hours) at 11/07/16 1418 Last data filed at 11/07/16 1141  Gross per 24 hour  Intake  397 ml  Output              675 ml  Net             -278 ml    PHYSICAL EXAMINATION:  GENERAL:  76 y.o.-year-old patient lying in the bed with no acute distress.  EYES: Pupils equal, round, reactive to light and accommodation. No scleral icterus. Extraocular muscles intact.  HEENT: Head atraumatic, normocephalic. Oropharynx and nasopharynx clear.  NECK:  Supple, no jugular venous distention. No thyroid enlargement, no tenderness.  LUNGS: Normal breath sounds bilaterally, no wheezing, rales,rhonchi. No use of accessory muscles of respiration.  CARDIOVASCULAR: S1, S2 normal. No murmurs, rubs, or gallops.  ABDOMEN: Soft, non-tender, non-distended. Bowel sounds present. No organomegaly or mass.  EXTREMITIES: No pedal edema, cyanosis, or clubbing.  NEUROLOGIC: Cranial nerves II through XII are intact. No focal motor or sensory defecits b/l.  PSYCHIATRIC: The patient is alert and oriented x 3.  SKIN: No obvious rash, lesion, or ulcer.   DATA REVIEW:   CBC  Recent Labs Lab 11/07/16 0553  WBC 8.9  HGB 14.7  HCT 43.1  PLT 150     Chemistries   Recent Labs Lab 11/05/16 1652 11/06/16 0519 11/07/16 0553  NA 137 133*  --   K 3.8 3.4* 3.8  CL 107 103  --   CO2 24 24  --   GLUCOSE 84 84  --   BUN 13 15  --   CREATININE 0.95 1.14  --   CALCIUM 8.5* 8.0*  --   AST 31  --   --   ALT 36  --   --   ALKPHOS 74  --   --   BILITOT 1.6*  --   --     Cardiac Enzymes  Recent Labs Lab 11/05/16 1433  TROPONINI 0.03*    Microbiology Results  Results for orders placed or performed during the hospital encounter of 11/05/16  Urine culture     Status: None   Collection Time: 11/05/16  4:52 PM  Result Value Ref Range Status   Specimen Description URINE, CLEAN CATCH  Final   Special Requests NONE  Final   Culture   Final    NO GROWTH Performed at [facility], [address] [place], [place] [address]    Report Status 11/06/2016 FINAL  Final  Blood Culture (routine x 2)     Status: None (Preliminary result)   Collection Time: 11/05/16  4:52 PM  Result Value Ref Range Status   Specimen Description BLOOD LEFT HAND  Final   Special Requests   Final    BOTTLES DRAWN AEROBIC AND ANAEROBIC Blood Culture results may not be optimal due to an inadequate volume of blood received in culture bottles   Culture NO GROWTH 2 DAYS  Final   Report Status PENDING  Incomplete  Blood Culture (routine x 2)     Status: None (Preliminary result)   Collection Time: 11/05/16  4:58 PM  Result Value Ref Range Status   Specimen Description BLOOD LEFT FOREARM  Final   Special Requests   Final    BOTTLES DRAWN AEROBIC AND ANAEROBIC Blood Culture adequate volume   Culture NO GROWTH 2 DAYS  Final   Report Status PENDING  Incomplete    RADIOLOGY:  Dg Chest 2 View  Result Date: 11/05/2016 CLINICAL DATA:  Fever and weakness. EXAM: CHEST  2 VIEW COMPARISON:  09/05/2016 FINDINGS: The cardiomediastinal silhouette is within normal limits.  The lungs remain hyperinflated with minimal chronic scarring in the lung bases. No acute  airspace consolidation, edema, pleural effusion, or pneumothorax is identified. Thoracic spondylosis is noted. IMPRESSION: No active cardiopulmonary disease. Electronically Signed   By: Logan Bores M.D.   On: 11/05/2016 15:08   Ct Abdomen Pelvis W Contrast  Result Date: 11/05/2016 CLINICAL DATA:  76 y/o M; left lower quadrant pain. Fever and weakness. Code sepsis. EXAM: CT ABDOMEN AND PELVIS WITH CONTRAST TECHNIQUE: Multidetector CT imaging of the abdomen and pelvis was performed using the standard protocol following bolus administration of intravenous contrast. CONTRAST:  132mL ISOVUE-300 IOPAMIDOL (ISOVUE-300) INJECTION 61% COMPARISON:  None. FINDINGS: Lower chest: Small focus consolidation within the left dependent lung base (series 4, image 13). Hepatobiliary: No focal liver abnormality is seen. No gallstones, gallbladder wall thickening, or biliary dilatation. Pancreas: Unremarkable. No pancreatic ductal dilatation or surrounding inflammatory changes. Spleen: Normal in size without focal abnormality. Adrenals/Urinary Tract: Adrenal glands are unremarkable. Kidneys are normal, without renal calculi, focal lesion, or hydronephrosis. Bladder is unremarkable. Stomach/Bowel: Stomach is within normal limits. Appendix not identified. Extensive diverticulosis of sigmoid colon. Segment of sigmoid colon within the left lower quadrant demonstrates wall thickening and surrounding inflammatory changes as well as a small volume of left lower quadrant ascites compatible with acute focal colitis/diverticulitis. No pericolonic abscess is identified. 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 Moderate predominantly fibrofatty plaque of the aorta and bilateral common iliac artery's. Right proximal common iliac artery plaque ulceration incidentally noted (series 2, image 53). Reproductive: Moderate prostate enlargement. Other: Small hiatal hernia. Musculoskeletal: Multilevel  degenerative changes of the spine greatest at L4-5 where there is moderate disc space narrowing. No acute osseous abnormality. IMPRESSION: 1. Short segment of acute colitis/diverticulitis of sigmoid colon in the left lower quadrant. Small volume of left lower quadrant ascites. No discrete abscess. 2. Small area of consolidation in the dependent left lower lobe may represent pneumonia or atelectasis. 3. 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 4. Aortic atherosclerosis with predominantly fibrofatty plaque. Electronically Signed   By: Kristine Garbe M.D.   On: 11/05/2016 20:13      Management plans discussed with the patient, family and they are in agreement.  CODE STATUS:     Code Status Orders        Start     Ordered   11/05/16 2258  Full code  Continuous     11/05/16 2258    Code Status History    Date Active Date Inactive Code Status Order ID Comments User Context   08/29/2016 10:54 PM 08/31/2016 12:04 PM Full Code [id number]  Gladstone Lighter, MD Inpatient   08/18/2016  4:34 PM 08/20/2016  4:11 PM Full Code [id number]  Vaughan Basta, MD Inpatient   11/04/2014  7:09 AM 11/09/2014  2:50 PM Full Code [id number]  Molly Maduro, MD ED    Advance Directive Documentation     Most Recent Value  Type of Advance Directive  Healthcare Power of [place], Living will  Pre-existing out of facility DNR order (yellow form or pink MOST form)  -  "MOST" Form in Place?  -      TOTAL TIME TAKING CARE OF THIS PATIENT: 40 minutes.    Henreitta Leber M.D on 11/07/2016 at 2:18 PM  Between 7am to 6pm - Pager - [phone]  After 6pm go to [email or web address] - [employer] [facility]  Office  [phone]  CC: Primary care physician;  Jerrol Banana., MD

## 2016-11-08 NOTE — Telephone Encounter (Signed)
Morning appt --15 minutes is fine

## 2016-11-08 NOTE — Telephone Encounter (Signed)
Pt called this morning to schedule a hospital follow up appointment.  Pt will be gone on vacation next week and can not come in.  I have scheduled an appointment with Simona Huh for 11/18/16 per Wilhemena Durie and Dr Rosanna Randy will be out of the office all week.  Pt was discharged from [facility] on 11/07/16 for Diverticulitis/MW

## 2016-11-10 LAB — CULTURE, BLOOD (ROUTINE X 2)
CULTURE: NO GROWTH
Culture: NO GROWTH
Special Requests: ADEQUATE

## 2016-11-12 ENCOUNTER — Telehealth: Payer: Self-pay

## 2016-11-14 NOTE — Telephone Encounter (Signed)
Transition Care Management Follow-up Telephone Call    Date discharged? 11/07/16  How have you been since you were released from the hospital? Recovering, declines abdominal pain, n/v/d, or bloody stool. No current symptoms.  Any patient concerns? None.   Items Reviewed:  Medications reviewed: Yes  Allergies reviewed: Yes  Dietary changes reviewed: N/A  Referrals reviewed: N/A   Functional Questionnaire:  Independent - I Dependent - D    Activities of Daily Living (ADLs):    Personal hygiene - I Dressing - I Eating - I Maintaining continence - I Transferring - I   Independent Activities of Daily Living (iADLs): Basic communication skills - I Transportation - I Meal preparation - I Shopping - I Housework - I Managing medications - I  Managing personal finances - I   Confirmed importance and date/time of follow-up visits scheduled YES  Provider Appointment booked with PCP 11/18/16 @ 2:00 PM.  Confirmed with patient if condition begins to worsen call PCP or go to the ER.  Patient was given the office number and encouraged to call back with question or concerns: YES

## 2016-11-15 NOTE — Telephone Encounter (Signed)
Acknowledged.

## 2016-11-18 ENCOUNTER — Encounter: Payer: Self-pay | Admitting: Family Medicine

## 2016-11-18 ENCOUNTER — Ambulatory Visit (INDEPENDENT_AMBULATORY_CARE_PROVIDER_SITE_OTHER): Payer: PPO | Admitting: Family Medicine

## 2016-11-18 VITALS — BP 130/80 | HR 68 | Temp 97.9°F | Resp 16 | Wt 172.0 lb

## 2016-11-18 DIAGNOSIS — K5792 Diverticulitis of intestine, part unspecified, without perforation or abscess without bleeding: Secondary | ICD-10-CM | POA: Diagnosis not present

## 2016-11-18 NOTE — Patient Instructions (Signed)

## 2016-11-18 NOTE — Progress Notes (Signed)
Patient: [name] Male    DOB: 11-02-1940   76 y.o.   MRN: [id number] Visit Date: 11/18/2016  Today's Provider: Vernie Murders, PA   Chief Complaint  Patient presents with  . Hospitalization Follow-up   Subjective:    HPI     Follow up Hospitalization  Patient was admitted to [facility] on 11/05/2016 and discharged on 11/07/2016. He was treated for diverticulitis. Treatment for this included Augmentin. Telephone follow up was done on 11/12/2016 He reports good compliance with treatment. He reports this condition is Improved. Pt states he feels 100% better.  ------------------------------------------------------------------------------------ Patient Active Problem List   Diagnosis Date Noted  . Sepsis ([place]) 11/05/2016  . Diverticulitis 11/05/2016  . History of deep vein thrombosis (DVT) of lower extremity 11/05/2016  . HCAP (healthcare-associated pneumonia) 08/29/2016  . NSTEMI (non-ST elevated myocardial infarction) ([place]) 08/18/2016  . Chronic obstructive pulmonary disease ([place]) 01/02/2016  . DVT, femoral, acute ([place]) 05/15/2015  . Recurrent pneumonia 12/21/2014  . Pneumothorax, right 11/04/2014  . Basal cell carcinoma 10/19/2014  . Abnormal liver enzymes 10/19/2014  . Personal history of methicillin resistant Staphylococcus aureus 10/19/2014  . Cannot sleep 10/19/2014  . Heel pain 10/19/2014  . Pain in shoulder 10/19/2014  . Back strain 10/19/2014  . Pulmonary aspergillosis, allergic bronchopulmonary type ([place]) 07/30/2014  . ABPA (allergic bronchopulmonary aspergillosis) ([place]) 07/30/2014  . CAP (community acquired pneumonia) 05/03/2014  . Abnormal CXR 04/01/2014  . Paroxysmal tachycardia ([place]) 06/23/2013  . Asymptomatic PVCs 06/23/2013  . Beat, premature ventricular 06/23/2013  . Cough 10/19/2012  . Allergic rhinitis 01/17/2009  . Asthma, chronic obstructive, without status asthmaticus ([place]) 11/04/2008  . CAFL (chronic airflow limitation) ([place]) 11/04/2008    . Essential hypertension 02/09/2008  . Arthropathia 08/28/2007  . Disorder of mitral valve 07/08/2005  . Benign prostatic hypertrophy without urinary obstruction 05/16/1997  . LBP (low back pain) 05/16/1997  . Degenerative arthritis of hip 03/18/1995   Past Surgical History:  Procedure Laterality Date  . APPENDECTOMY    . BRONCHOSCOPY    . LEFT HEART CATH AND CORONARY ANGIOGRAPHY N/A 08/19/2016   Procedure: Left Heart Cath and Coronary Angiography;  Surgeon: Dionisio David, MD;  Location: [facility];  Service: Cardiovascular;  Laterality: N/A;  . NASAL SINUS SURGERY  09/27/2014   Family History  Problem Relation Age of Onset  . Breast cancer Sister   . Heart failure Mother   . Pancreatic cancer Sister   . Alzheimer's disease Father   . Brain cancer Daughter    Allergies  Allergen Reactions  . Timolol Maleate [Timolol Maleate] Shortness Of Breath  . Bactrim [Sulfamethoxazole-Trimethoprim]     Pt says "it takes his skin pigment off  . Budesonide-Formoterol Fumarate Other (See Comments)    SYMBICORT Patient reports dizziness and trembling/shaky feeling.  . Lisinopril     cough     Current Outpatient Prescriptions:  .  ADVAIR HFA 893-73 MCG/ACT inhaler, Inhale 2 puffs into the lungs 2 (two) times daily. , Disp: , Rfl:  .  aspirin EC 81 MG EC tablet, Take 1 tablet (81 mg total) by mouth daily., Disp: 30 tablet, Rfl: 0 .  atorvastatin (LIPITOR) 40 MG tablet, Take 1 tablet (40 mg total) by mouth daily at 6 PM., Disp: 90 tablet, Rfl: 3 .  brimonidine (ALPHAGAN) 0.15 % ophthalmic solution, Place 1 drop into both eyes 2 (two) times daily. , Disp: , Rfl:  .  Calcium Carb-Cholecalciferol (CALCIUM 600 + D  PO), Take 1 tablet by mouth 2 (two) times daily., Disp: , Rfl:  .  diclofenac sodium (VOLTAREN) 1 % GEL, Apply 2 g topically 4 (four) times daily., Disp: , Rfl:  .  diltiazem (CARDIZEM CD) 180 MG 24 hr capsule, Take 1 capsule (180 mg total) by mouth daily., Disp: 90 capsule,  Rfl: 3 .  dorzolamide (TRUSOPT) 2 % ophthalmic solution, Place 1 drop into both eyes 2 (two) times daily. , Disp: , Rfl:  .  fluticasone (FLONASE) 50 MCG/ACT nasal spray, 2 sprays by Each Nare route Two (2) times a day., Disp: , Rfl:  .  gabapentin (NEURONTIN) 400 MG capsule, Take 400 mg by mouth at bedtime. , Disp: , Rfl:  .  isosorbide mononitrate (IMDUR) 30 MG 24 hr tablet, Take 1 tablet (30 mg total) by mouth daily., Disp: 90 tablet, Rfl: 3 .  latanoprost (XALATAN) 0.005 % ophthalmic solution, Place 1 drop into both eyes at bedtime. , Disp: , Rfl:  .  Multiple Vitamin (MULTIVITAMIN) tablet, Take 1 tablet by mouth daily., Disp: , Rfl:  .  tiotropium (SPIRIVA HANDIHALER) 18 MCG inhalation capsule, Place 18 mcg into inhaler and inhale daily. , Disp: , Rfl:  .  XARELTO 20 MG TABS tablet, Take 10 mg by mouth. , Disp: , Rfl:   Review of Systems  Constitutional: Negative for activity change, appetite change, chills, diaphoresis, fatigue, fever and unexpected weight change.  Respiratory: Negative for cough and shortness of breath.   Cardiovascular: Negative for chest pain, palpitations and leg swelling.  Gastrointestinal: Negative for abdominal pain.    Social History  Substance Use Topics  . Smoking status: Former Smoker    Packs/day: 0.50    Years: 3.00    Types: Cigarettes    Quit date: 06/17/1958  . Smokeless tobacco: Never Used     Comment: smoking while in college  . Alcohol use No     Comment: OCCASSIONAL WINE ONLY   Objective:   BP 130/80 (BP Location: Right Arm, Patient Position: Sitting, Cuff Size: Normal)   Pulse 68   Temp 97.9 F (36.6 C) (Oral)   Resp 16   Wt 172 lb (78 kg)   BMI 22.08 kg/m  Vitals:   11/18/16 1359  BP: 130/80  Pulse: 68  Resp: 16  Temp: 97.9 F (36.6 C)  TempSrc: Oral  Weight: 172 lb (78 kg)     Physical Exam  Constitutional: He is oriented to person, place, and time. He appears well-developed and well-nourished. No distress.  HENT:  Head:  Normocephalic and atraumatic.  Right Ear: Hearing normal.  Left Ear: Hearing normal.  Nose: Nose normal.  Eyes: Conjunctivae and lids are normal. Right eye exhibits no discharge. Left eye exhibits no discharge. No scleral icterus.  Cardiovascular: Normal rate and regular rhythm.   Pulmonary/Chest: Effort normal and breath sounds normal. No respiratory distress.  Abdominal: Soft. Bowel sounds are normal.  Musculoskeletal: Normal range of motion.  Neurological: He is alert and oriented to person, place, and time.  Skin: Skin is intact. No lesion and no rash noted.  Psychiatric: He has a normal mood and affect. His speech is normal and behavior is normal. Thought content normal.      Assessment & Plan:     1. Diverticulitis Hospitalized 11-05-16 to 11-07-16 with acute diverticulitis. Treated with Augmentin for a week and feeling greatly improved. No diarrhea, constipation or hematochezia. Back to regular diet and finished all the antibiotic. Given printout of diverticulitis and follow  up for CPE in July. Recheck as needed.       Vernie Murders, PA  [facility]

## 2016-11-25 DIAGNOSIS — R55 Syncope and collapse: Secondary | ICD-10-CM | POA: Diagnosis not present

## 2016-11-25 DIAGNOSIS — I371 Nonrheumatic pulmonary valve insufficiency: Secondary | ICD-10-CM | POA: Diagnosis not present

## 2016-11-25 DIAGNOSIS — I2510 Atherosclerotic heart disease of native coronary artery without angina pectoris: Secondary | ICD-10-CM | POA: Diagnosis not present

## 2016-12-19 ENCOUNTER — Encounter: Payer: Self-pay | Admitting: Family Medicine

## 2016-12-26 DIAGNOSIS — L820 Inflamed seborrheic keratosis: Secondary | ICD-10-CM | POA: Diagnosis not present

## 2016-12-26 DIAGNOSIS — Z79899 Other long term (current) drug therapy: Secondary | ICD-10-CM | POA: Diagnosis not present

## 2016-12-26 DIAGNOSIS — Z9089 Acquired absence of other organs: Secondary | ICD-10-CM | POA: Diagnosis not present

## 2016-12-26 DIAGNOSIS — Z86718 Personal history of other venous thrombosis and embolism: Secondary | ICD-10-CM | POA: Diagnosis not present

## 2016-12-26 DIAGNOSIS — L578 Other skin changes due to chronic exposure to nonionizing radiation: Secondary | ICD-10-CM | POA: Diagnosis not present

## 2016-12-26 DIAGNOSIS — J45909 Unspecified asthma, uncomplicated: Secondary | ICD-10-CM | POA: Diagnosis not present

## 2016-12-26 DIAGNOSIS — Z7982 Long term (current) use of aspirin: Secondary | ICD-10-CM | POA: Diagnosis not present

## 2016-12-26 DIAGNOSIS — Z8709 Personal history of other diseases of the respiratory system: Secondary | ICD-10-CM | POA: Diagnosis not present

## 2016-12-26 DIAGNOSIS — Z872 Personal history of diseases of the skin and subcutaneous tissue: Secondary | ICD-10-CM | POA: Diagnosis not present

## 2016-12-26 DIAGNOSIS — Z8619 Personal history of other infectious and parasitic diseases: Secondary | ICD-10-CM | POA: Diagnosis not present

## 2016-12-26 DIAGNOSIS — Z8701 Personal history of pneumonia (recurrent): Secondary | ICD-10-CM | POA: Diagnosis not present

## 2016-12-26 DIAGNOSIS — Z7951 Long term (current) use of inhaled steroids: Secondary | ICD-10-CM | POA: Diagnosis not present

## 2016-12-26 DIAGNOSIS — D485 Neoplasm of uncertain behavior of skin: Secondary | ICD-10-CM | POA: Diagnosis not present

## 2016-12-26 DIAGNOSIS — M1611 Unilateral primary osteoarthritis, right hip: Secondary | ICD-10-CM | POA: Diagnosis not present

## 2016-12-26 DIAGNOSIS — L739 Follicular disorder, unspecified: Secondary | ICD-10-CM | POA: Diagnosis not present

## 2016-12-26 DIAGNOSIS — Z7901 Long term (current) use of anticoagulants: Secondary | ICD-10-CM | POA: Diagnosis not present

## 2016-12-26 DIAGNOSIS — Z978 Presence of other specified devices: Secondary | ICD-10-CM | POA: Diagnosis not present

## 2016-12-26 DIAGNOSIS — I10 Essential (primary) hypertension: Secondary | ICD-10-CM | POA: Diagnosis not present

## 2016-12-26 DIAGNOSIS — L821 Other seborrheic keratosis: Secondary | ICD-10-CM | POA: Diagnosis not present

## 2016-12-26 DIAGNOSIS — J449 Chronic obstructive pulmonary disease, unspecified: Secondary | ICD-10-CM | POA: Diagnosis not present

## 2016-12-31 ENCOUNTER — Ambulatory Visit (INDEPENDENT_AMBULATORY_CARE_PROVIDER_SITE_OTHER): Payer: PPO

## 2016-12-31 VITALS — BP 132/72 | HR 64 | Temp 98.7°F | Ht 74.0 in | Wt 169.6 lb

## 2016-12-31 DIAGNOSIS — Z0000 Encounter for general adult medical examination without abnormal findings: Secondary | ICD-10-CM

## 2016-12-31 NOTE — Patient Instructions (Signed)
[name] , Thank you for taking time to come for your Medicare Wellness Visit. I appreciate your ongoing commitment to your health goals. Please review the following plan we discussed and let me know if I can assist you in the future.   Screening recommendations/referrals: Colonoscopy: completed 02/13/06 Recommended yearly ophthalmology/optometry visit for glaucoma screening and checkup Recommended yearly dental visit for hygiene and checkup  Vaccinations: Influenza vaccine: due 02/2017 Pneumococcal vaccine: completed series Tdap vaccine: completed 09/11/10, due 08/2020 Shingles vaccine: completed 01/02/11  Advanced directives: Advance directive discussed with you today. Even though you declined this today please call our office should you change your mind and we can give you the proper paperwork for you to fill out.  Conditions/risks identified: Continue drinking 8 glasses of water a day.   Next appointment: 01/14/17 at 9:00 AM  Preventive Care 78 Years and Older, Male Preventive care refers to lifestyle choices and visits with your health care provider that can promote health and wellness. What does preventive care include?  A yearly physical exam. This is also called an annual well check.  Dental exams once or twice a year.  Routine eye exams. Ask your health care provider how often you should have your eyes checked.  Personal lifestyle choices, including:  Daily care of your teeth and gums.  Regular physical activity.  Eating a healthy diet.  Avoiding tobacco and drug use.  Limiting alcohol use.  Practicing safe sex.  Taking low doses of aspirin every day.  Taking vitamin and mineral supplements as recommended by your health care provider. What happens during an annual well check? The services and screenings done by your health care provider during your annual well check will depend on your age, overall health, lifestyle risk factors, and family history of  disease. Counseling  Your health care provider may ask you questions about your:  Alcohol use.  Tobacco use.  Drug use.  Emotional well-being.  Home and relationship well-being.  Sexual activity.  Eating habits.  History of falls.  Memory and ability to understand (cognition).  Work and work Statistician. Screening  You may have the following tests or measurements:  Height, weight, and BMI.  Blood pressure.  Lipid and cholesterol levels. These may be checked every 5 years, or more frequently if you are over 19 years old.  Skin check.  Lung cancer screening. You may have this screening every year starting at age 36 if you have a 30-pack-year history of smoking and currently smoke or have quit within the past 15 years.  Fecal occult blood test (FOBT) of the stool. You may have this test every year starting at age 61.  Flexible sigmoidoscopy or colonoscopy. You may have a sigmoidoscopy every 5 years or a colonoscopy every 10 years starting at age 39.  Prostate cancer screening. Recommendations will vary depending on your family history and other risks.  Hepatitis C blood test.  Hepatitis B blood test.  Sexually transmitted disease (STD) testing.  Diabetes screening. This is done by checking your blood sugar (glucose) after you have not eaten for a while (fasting). You may have this done every 1-3 years.  Abdominal aortic aneurysm (AAA) screening. You may need this if you are a current or former smoker.  Osteoporosis. You may be screened starting at age 71 if you are at high risk. Talk with your health care provider about your test results, treatment options, and if necessary, the need for more tests. Vaccines  Your health care provider may  recommend certain vaccines, such as:  Influenza vaccine. This is recommended every year.  Tetanus, diphtheria, and acellular pertussis (Tdap, Td) vaccine. You may need a Td booster every 10 years.  Zoster vaccine. You may  need this after age 36.  Pneumococcal 13-valent conjugate (PCV13) vaccine. One dose is recommended after age 63.  Pneumococcal polysaccharide (PPSV23) vaccine. One dose is recommended after age 2. Talk to your health care provider about which screenings and vaccines you need and how often you need them. This information is not intended to replace advice given to you by your health care provider. Make sure you discuss any questions you have with your health care provider. Document Released: 06/30/2015 Document Revised: 02/21/2016 Document Reviewed: 04/04/2015 Elsevier Interactive Patient Education  2017 [place] Prevention in the Home Falls can cause injuries. They can happen to people of all ages. There are many things you can do to make your home safe and to help prevent falls. What can I do on the outside of my home?  Regularly fix the edges of walkways and driveways and fix any cracks.  Remove anything that might make you trip as you walk through a door, such as a raised step or threshold.  Trim any bushes or trees on the path to your home.  Use bright outdoor lighting.  Clear any walking paths of anything that might make someone trip, such as rocks or tools.  Regularly check to see if handrails are loose or broken. Make sure that both sides of any steps have handrails.  Any raised decks and porches should have guardrails on the edges.  Have any leaves, snow, or ice cleared regularly.  Use sand or salt on walking paths during winter.  Clean up any spills in your garage right away. This includes oil or grease spills. What can I do in the bathroom?  Use night lights.  Install grab bars by the toilet and in the tub and shower. Do not use towel bars as grab bars.  Use non-skid mats or decals in the tub or shower.  If you need to sit down in the shower, use a plastic, non-slip stool.  Keep the floor dry. Clean up any water that spills on the floor as soon as it  happens.  Remove soap buildup in the tub or shower regularly.  Attach bath mats securely with double-sided non-slip rug tape.  Do not have throw rugs and other things on the floor that can make you trip. What can I do in the bedroom?  Use night lights.  Make sure that you have a light by your bed that is easy to reach.  Do not use any sheets or blankets that are too big for your bed. They should not hang down onto the floor.  Have a firm chair that has side arms. You can use this for support while you get dressed.  Do not have throw rugs and other things on the floor that can make you trip. What can I do in the kitchen?  Clean up any spills right away.  Avoid walking on wet floors.  Keep items that you use a lot in easy-to-reach places.  If you need to reach something above you, use a strong step stool that has a grab bar.  Keep electrical cords out of the way.  Do not use floor polish or wax that makes floors slippery. If you must use wax, use non-skid floor wax.  Do not have throw rugs and  other things on the floor that can make you trip. What can I do with my stairs?  Do not leave any items on the stairs.  Make sure that there are handrails on both sides of the stairs and use them. Fix handrails that are broken or loose. Make sure that handrails are as long as the stairways.  Check any carpeting to make sure that it is firmly attached to the stairs. Fix any carpet that is loose or worn.  Avoid having throw rugs at the top or bottom of the stairs. If you do have throw rugs, attach them to the floor with carpet tape.  Make sure that you have a light switch at the top of the stairs and the bottom of the stairs. If you do not have them, ask someone to add them for you. What else can I do to help prevent falls?  Wear shoes that:  Do not have high heels.  Have rubber bottoms.  Are comfortable and fit you well.  Are closed at the toe. Do not wear sandals.  If you  use a stepladder:  Make sure that it is fully opened. Do not climb a closed stepladder.  Make sure that both sides of the stepladder are locked into place.  Ask someone to hold it for you, if possible.  Clearly mark and make sure that you can see:  Any grab bars or handrails.  First and last steps.  Where the edge of each step is.  Use tools that help you move around (mobility aids) if they are needed. These include:  Canes.  Walkers.  Scooters.  Crutches.  Turn on the lights when you go into a dark area. Replace any light bulbs as soon as they burn out.  Set up your furniture so you have a clear path. Avoid moving your furniture around.  If any of your floors are uneven, fix them.  If there are any pets around you, be aware of where they are.  Review your medicines with your doctor. Some medicines can make you feel dizzy. This can increase your chance of falling. Ask your doctor what other things that you can do to help prevent falls. This information is not intended to replace advice given to you by your health care provider. Make sure you discuss any questions you have with your health care provider. Document Released: 03/30/2009 Document Revised: 11/09/2015 Document Reviewed: 07/08/2014 Elsevier Interactive Patient Education  2017 [employer].

## 2016-12-31 NOTE — Progress Notes (Signed)
Subjective:   [name] is a 76 y.o. male who presents for Medicare Annual/Subsequent preventive examination.  Review of Systems:  N/A  Cardiac Risk Factors include: advanced age (>46men, >59 women);dyslipidemia;male gender;hypertension     Objective:    Vitals: BP 132/72 (BP Location: Left Arm)   Pulse 64   Temp 98.7 F (37.1 C) (Oral)   Ht 6\' 2"  (1.88 m)   Wt 169 lb 9.6 oz (76.9 kg)   BMI 21.78 kg/m   Body mass index is 21.78 kg/m.  Tobacco History  Smoking Status  . Former Smoker  . Packs/day: 0.50  . Years: 3.00  . Types: Cigarettes  . Quit date: 06/17/1958  Smokeless Tobacco  . Never Used    Comment: smoking while in college     Counseling given: Not Answered   Past Medical History:  Diagnosis Date  . AB (asthmatic bronchitis)   . Allergic rhinitis   . Arthropathy   . Asthma   . Cellulitis   . DVT (deep venous thrombosis) ([place])   . Foreign body in skin   . Glaucoma   . Hx MRSA infection   . Hx MRSA infection   . Hypertension   . Insomnia   . Insomnia   . Mitral valve disorder   . Osteoarthrosis   . Upper back strain    Past Surgical History:  Procedure Laterality Date  . APPENDECTOMY    . BRONCHOSCOPY    . LEFT HEART CATH AND CORONARY ANGIOGRAPHY N/A 08/19/2016   Procedure: Left Heart Cath and Coronary Angiography;  Surgeon: Dionisio David, MD;  Location: [facility];  Service: Cardiovascular;  Laterality: N/A;  . NASAL SINUS SURGERY  09/27/2014   Family History  Problem Relation Age of Onset  . Breast cancer Sister   . Heart failure Mother   . Pancreatic cancer Sister   . Alzheimer's disease Father   . Brain cancer Daughter    History  Sexual Activity  . Sexual activity: Not on file    Outpatient Encounter Prescriptions as of 12/31/2016  Medication Sig  . ADVAIR HFA 230-21 MCG/ACT inhaler Inhale 2 puffs into the lungs 2 (two) times daily.   Marland Kitchen albuterol (PROVENTIL HFA;VENTOLIN HFA) 108 (90 Base) MCG/ACT inhaler Inhale into  the lungs. 2 puffs twice daily  . aspirin EC 81 MG EC tablet Take 1 tablet (81 mg total) by mouth daily.  Marland Kitchen atorvastatin (LIPITOR) 40 MG tablet Take 1 tablet (40 mg total) by mouth daily at 6 PM.  . brimonidine (ALPHAGAN) 0.15 % ophthalmic solution Place 1 drop into both eyes 2 (two) times daily.   . Calcium Carb-Cholecalciferol (CALCIUM 600 + D PO) Take 1 tablet by mouth 2 (two) times daily.  . diclofenac sodium (VOLTAREN) 1 % GEL Apply 2 g topically 4 (four) times daily.  Marland Kitchen diltiazem (CARDIZEM CD) 180 MG 24 hr capsule Take 1 capsule (180 mg total) by mouth daily.  . dorzolamide (TRUSOPT) 2 % ophthalmic solution Place 1 drop into both eyes 2 (two) times daily.   . fluticasone (FLONASE) 50 MCG/ACT nasal spray 2 sprays by Each Nare route Two (2) times a day.  . gabapentin (NEURONTIN) 400 MG capsule Take 400 mg by mouth at bedtime.   . isosorbide mononitrate (IMDUR) 30 MG 24 hr tablet Take 1 tablet (30 mg total) by mouth daily.  Marland Kitchen latanoprost (XALATAN) 0.005 % ophthalmic solution Place 1 drop into both eyes at bedtime.   . Multiple Vitamin (MULTIVITAMIN)  tablet Take 1 tablet by mouth daily.  Marland Kitchen tiotropium (SPIRIVA HANDIHALER) 18 MCG inhalation capsule Place 18 mcg into inhaler and inhale daily.   Alveda Reasons 20 MG TABS tablet Take 10 mg by mouth.    No facility-administered encounter medications on file as of 12/31/2016.     Activities of Daily Living In your present state of health, do you have any difficulty performing the following activities: 12/31/2016 11/05/2016  Hearing? N N  Vision? N N  Difficulty concentrating or making decisions? N N  Walking or climbing stairs? N N  Dressing or bathing? N N  Doing errands, shopping? N N  Preparing Food and eating ? N -  Using the Toilet? N -  In the past six months, have you accidently leaked urine? N -  Do you have problems with loss of bowel control? N -  Managing your Medications? N -  Managing your Finances? N -  Housekeeping or managing your  Housekeeping? N -  Some recent data might be hidden    Patient Care Team: Jerrol Banana., MD as PCP - General (Family Medicine) Leandrew Koyanagi, MD as Referring Physician (Ophthalmology) Robyne Askew, MD as Referring Physician (Internal Medicine) Dory Horn, MD as Referring Physician (Otolaryngology)   Assessment:     Exercise Activities and Dietary recommendations Current Exercise Habits: Structured exercise class, Type of exercise: Other - see comments;strength training/weights (eliptical, bicycle), Time (Minutes): > 60 (1.5 hour), Frequency (Times/Week): 3, Weekly Exercise (Minutes/Week): 0, Intensity: Mild  Goals    . Increase water intake          Continue drinking 8 glasses of water a day.       Fall Risk Fall Risk  12/31/2016 01/02/2016 12/21/2014  Falls in the past year? No No No   Depression Screen PHQ 2/9 Scores 12/31/2016 12/31/2016 01/02/2016 12/21/2014  PHQ - 2 Score 0 0 0 0  PHQ- 9 Score 0 - - -    Cognitive Function     6CIT Screen 12/31/2016  What Year? 0 points  What month? 0 points  What time? 0 points  Count back from 20 0 points  Months in reverse 0 points  Repeat phrase 4 points  Total Score 4    Immunization History  Administered Date(s) Administered  . Influenza Split 03/02/2012, 03/17/2013, 05/07/2014  . Influenza Whole 03/24/2007, 03/15/2009, 02/15/2010  . Influenza, High Dose Seasonal PF 05/04/2015, 03/21/2016  . Pneumococcal Conjugate-13 11/17/2013  . Pneumococcal Polysaccharide-23 11/19/2004, 03/19/2007, 03/17/2009  . Td 05/16/1997, 05/14/2007, 09/11/2010  . Zoster 01/02/2011   Screening Tests Health Maintenance  Topic Date Due  . INFLUENZA VACCINE  01/15/2017  . TETANUS/TDAP  09/10/2020  . PNA vac Low Risk Adult  Completed      Plan:  I have personally reviewed and addressed the Medicare Annual Wellness questionnaire and have noted the following in the patient's chart:  A. Medical and social history B. Use of  alcohol, tobacco or illicit drugs  C. Current medications and supplements D. Functional ability and status E.  Nutritional status F.  Physical activity G. Advance directives H. List of other physicians I.  Hospitalizations, surgeries, and ER visits in previous 12 months J.  [place] such as hearing and vision if needed, cognitive and depression L. Referrals and appointments - none  In addition, I have reviewed and discussed with patient certain preventive protocols, quality metrics, and best practice recommendations. A written personalized care plan for preventive services as well  as general preventive health recommendations were provided to patient.  See attached scanned questionnaire for additional information.   Signed,  Fabio Neighbors, LPN Nurse Health Advisor   MD Recommendations: None.

## 2017-01-01 ENCOUNTER — Ambulatory Visit (INDEPENDENT_AMBULATORY_CARE_PROVIDER_SITE_OTHER): Payer: PPO | Admitting: Podiatry

## 2017-01-01 ENCOUNTER — Encounter: Payer: Self-pay | Admitting: Podiatry

## 2017-01-01 DIAGNOSIS — M778 Other enthesopathies, not elsewhere classified: Secondary | ICD-10-CM

## 2017-01-01 DIAGNOSIS — M7751 Other enthesopathy of right foot: Secondary | ICD-10-CM | POA: Diagnosis not present

## 2017-01-01 DIAGNOSIS — M779 Enthesopathy, unspecified: Principal | ICD-10-CM

## 2017-01-01 NOTE — Progress Notes (Signed)
He presents today for his MRI read from April. States the foot still been quite sore right here and right ear as he had marked the areas with the next. Both of these at the proximal most angles of the first and the fourth intermetatarsal spaces.  Objective: MRI does demonstrate osteoarthritis across Lisfranc's joints. He has pain on palpation of these sites today. Pulses are palpable.  Assessment: Osteoarthritis mid foot right.  Plan: Injected into the first and fifth metatarsal spaces proximally today with Kenalog and local anesthetic he states that this should help relieve the symptoms because it feels much better at this time. Follow up with him in December

## 2017-01-14 ENCOUNTER — Ambulatory Visit (INDEPENDENT_AMBULATORY_CARE_PROVIDER_SITE_OTHER): Payer: PPO | Admitting: Family Medicine

## 2017-01-14 VITALS — BP 110/64 | HR 70 | Temp 97.7°F | Resp 16 | Ht 74.0 in | Wt 164.0 lb

## 2017-01-14 DIAGNOSIS — Z1211 Encounter for screening for malignant neoplasm of colon: Secondary | ICD-10-CM | POA: Diagnosis not present

## 2017-01-14 NOTE — Progress Notes (Signed)
Patient: [name], Male    DOB: Jan 21, 1941, 76 y.o.   MRN: [id number] Visit Date: 01/14/2017  Today's Provider: Wilhemena Durie, MD   Chief Complaint  Patient presents with  . Annual Exam   Subjective:   [name] is a 76 y.o. male who presents today for his Subsequent Annual Wellness Visit. He feels well. He reports exercising 3 days per week at [facility]. He reports he is sleeping well.   Immunization History  Administered Date(s) Administered  . Influenza Split 03/02/2012, 03/17/2013, 05/07/2014  . Influenza Whole 03/24/2007, 03/15/2009, 02/15/2010  . Influenza, High Dose Seasonal PF 05/04/2015, 03/21/2016  . Pneumococcal Conjugate-13 11/17/2013  . Pneumococcal Polysaccharide-23 11/19/2004, 03/19/2007, 03/17/2009  . Td 05/16/1997, 05/14/2007, 09/11/2010  . Zoster 01/02/2011   02/13/2006 Colonoscopy-Diverticulosis 01/02/2016 [facility], last colon cancer screening testing  Review of Systems  Constitutional: Negative.   HENT: Negative.   Eyes: Negative.   Respiratory: Negative.   Cardiovascular: Negative.   Gastrointestinal: Negative.   Endocrine: Negative.   Genitourinary: Negative.   Musculoskeletal: Negative.   Skin: Negative.   Allergic/Immunologic: Negative.   Neurological: Negative.   Hematological: Negative.   Psychiatric/Behavioral: Negative.     Patient Active Problem List   Diagnosis Date Noted  . Sepsis ([place]) 11/05/2016  . Diverticulitis 11/05/2016  . History of deep vein thrombosis (DVT) of lower extremity 11/05/2016  . HCAP (healthcare-associated pneumonia) 08/29/2016  . NSTEMI (non-ST elevated myocardial infarction) ([place]) 08/18/2016  . Chronic obstructive pulmonary disease ([place]) 01/02/2016  . DVT, femoral, acute ([place]) 05/15/2015  . Recurrent pneumonia 12/21/2014  . Pneumothorax, right 11/04/2014  . Basal cell carcinoma 10/19/2014  . Abnormal liver enzymes 10/19/2014  . Personal history of methicillin resistant Staphylococcus aureus 10/19/2014  .  Cannot sleep 10/19/2014  . Heel pain 10/19/2014  . Pain in shoulder 10/19/2014  . Back strain 10/19/2014  . Pulmonary aspergillosis, allergic bronchopulmonary type ([place]) 07/30/2014  . ABPA (allergic bronchopulmonary aspergillosis) ([place]) 07/30/2014  . CAP (community acquired pneumonia) 05/03/2014  . Abnormal CXR 04/01/2014  . Paroxysmal tachycardia ([place]) 06/23/2013  . Asymptomatic PVCs 06/23/2013  . Beat, premature ventricular 06/23/2013  . Cough 10/19/2012  . Allergic rhinitis 01/17/2009  . Asthma, chronic obstructive, without status asthmaticus ([place]) 11/04/2008  . CAFL (chronic airflow limitation) ([place]) 11/04/2008  . Essential hypertension 02/09/2008  . Arthropathia 08/28/2007  . Disorder of mitral valve 07/08/2005  . Benign prostatic hypertrophy without urinary obstruction 05/16/1997  . LBP (low back pain) 05/16/1997  . Degenerative arthritis of hip 03/18/1995    Social History   Social History  . Marital status: Married    Spouse name: Everlene Farrier  . Number of children: 2  . Years of education: College   Occupational History  . Retired Retired    Chief Financial Officer   Social History Main Topics  . Smoking status: Former Smoker    Packs/day: 0.50    Years: 3.00    Types: Cigarettes    Quit date: 06/17/1958  . Smokeless tobacco: Never Used     Comment: smoking while in college  . Alcohol use 1.2 - 1.8 oz/week    2 - 3 Glasses of wine per week  . Drug use: No  . Sexual activity: Not on file   Other Topics Concern  . Not on file   Social History Narrative   He is a married father of 2 daughters.   He smoked casually while in college for about 3 years less than a pack a day.  He drinks 3-4 glass of wine at night. He also has about 2 servings of coffee a day   He is a very active gentleman with [employer] exercise program at least 3-4 days a week.   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   In addition to this standing his exercise, he does lots of  gardening and uses a chain saw to cut wood.             Past Surgical History:  Procedure Laterality Date  . APPENDECTOMY    . BRONCHOSCOPY    . LEFT HEART CATH AND CORONARY ANGIOGRAPHY N/A 08/19/2016   Procedure: Left Heart Cath and Coronary Angiography;  Surgeon: Dionisio David, MD;  Location: [facility];  Service: Cardiovascular;  Laterality: N/A;  . NASAL SINUS SURGERY  09/27/2014    His family history includes Alzheimer's disease in his father; Brain cancer in his daughter; Breast cancer in his sister; Heart failure in his mother; Pancreatic cancer in his sister.     Outpatient Encounter Prescriptions as of 01/14/2017  Medication Sig  . ADVAIR HFA 230-21 MCG/ACT inhaler Inhale 2 puffs into the lungs 2 (two) times daily.   Marland Kitchen albuterol (PROVENTIL HFA;VENTOLIN HFA) 108 (90 Base) MCG/ACT inhaler Inhale into the lungs. 2 puffs twice daily  . aspirin EC 81 MG EC tablet Take 1 tablet (81 mg total) by mouth daily.  Marland Kitchen atorvastatin (LIPITOR) 40 MG tablet Take 1 tablet (40 mg total) by mouth daily at 6 PM.  . brimonidine (ALPHAGAN) 0.15 % ophthalmic solution Place 1 drop into both eyes 2 (two) times daily.   . Calcium Carb-Cholecalciferol (CALCIUM 600 + D PO) Take 1 tablet by mouth 2 (two) times daily.  . diclofenac sodium (VOLTAREN) 1 % GEL Apply 2 g topically 4 (four) times daily.  Marland Kitchen diltiazem (CARDIZEM CD) 180 MG 24 hr capsule Take 1 capsule (180 mg total) by mouth daily.  . dorzolamide (TRUSOPT) 2 % ophthalmic solution Place 1 drop into both eyes 2 (two) times daily.   . fluticasone (FLONASE) 50 MCG/ACT nasal spray 2 sprays by Each Nare route Two (2) times a day.  . gabapentin (NEURONTIN) 400 MG capsule Take 400 mg by mouth at bedtime.   . isosorbide mononitrate (IMDUR) 30 MG 24 hr tablet Take 1 tablet (30 mg total) by mouth daily.  Marland Kitchen latanoprost (XALATAN) 0.005 % ophthalmic solution Place 1 drop into both eyes at bedtime.   . Multiple Vitamin (MULTIVITAMIN) tablet Take 1 tablet  by mouth daily.  Marland Kitchen tiotropium (SPIRIVA HANDIHALER) 18 MCG inhalation capsule Place 18 mcg into inhaler and inhale daily.   Alveda Reasons 20 MG TABS tablet Take 10 mg by mouth.    No facility-administered encounter medications on file as of 01/14/2017.     Allergies  Allergen Reactions  . Timolol Maleate [Timolol Maleate] Shortness Of Breath  . Bactrim [Sulfamethoxazole-Trimethoprim]     Pt says "it takes his skin pigment off  . Budesonide-Formoterol Fumarate Other (See Comments)    SYMBICORT Patient reports dizziness and trembling/shaky feeling.  . Lisinopril     cough    Patient Care Team: Jerrol Banana., MD as PCP - General (Family Medicine) Leandrew Koyanagi, MD as Referring Physician (Ophthalmology) Robyne Askew, MD as Referring Physician (Internal Medicine) Dory Horn, MD as Referring Physician (Otolaryngology)   Objective:   Vitals:  Vitals:   01/14/17 0858  BP: 110/64  Pulse: 70  Resp:  16  Temp: 97.7 F (36.5 C)  TempSrc: Oral  Weight: 164 lb (74.4 kg)  Height: 6\' 2"  (1.88 m)    Physical Exam  Constitutional: He is oriented to person, place, and time. He appears well-developed and well-nourished.  HENT:  Head: Normocephalic and atraumatic.  Right Ear: External ear normal.  Left Ear: External ear normal.  Nose: Nose normal.  Mouth/Throat: Oropharynx is clear and moist.  Eyes: Pupils are equal, round, and reactive to light. Conjunctivae and EOM are normal.  Neck: Normal range of motion. Neck supple.  Cardiovascular: Normal rate, regular rhythm, normal heart sounds and intact distal pulses.   Pulmonary/Chest: Effort normal and breath sounds normal.  Abdominal: Soft. Bowel sounds are normal.  Musculoskeletal: Normal range of motion.  Neurological: He is alert and oriented to person, place, and time.  Skin: Skin is warm and dry.  Psychiatric: He has a normal mood and affect. His behavior is normal. Judgment and thought content normal.     Activities of Daily Living In your present state of health, do you have any difficulty performing the following activities: 01/14/2017 12/31/2016  Hearing? N N  Vision? N N  Difficulty concentrating or making decisions? N N  Walking or climbing stairs? N N  Dressing or bathing? N N  Doing errands, shopping? N N  Preparing Food and eating ? - N  Using the Toilet? - N  In the past six months, have you accidently leaked urine? - N  Do you have problems with loss of bowel control? - N  Managing your Medications? - N  Managing your Finances? - N  Housekeeping or managing your Housekeeping? - N  Some recent data might be hidden    Fall Risk Assessment Fall Risk  01/14/2017 12/31/2016 01/02/2016 12/21/2014  Falls in the past year? No No No No     Depression Screen PHQ 2/9 Scores 01/14/2017 12/31/2016 12/31/2016 01/02/2016  PHQ - 2 Score 0 0 0 0  PHQ- 9 Score - 0 - -   Home Exercise  12/31/2016  Current Exercise Habits Structured exercise class  Type of exercise Other - see comments;strength training/weights  Time (Minutes) > 60  Frequency (Times/Week) 3  Weekly Exercise (Minutes/Week) 0  Intensity Mild   6CIT Screen 12/31/2016  What Year? 0 points  What month? 0 points  What time? 0 points  Count back from 20 0 points  Months in reverse 0 points  Repeat phrase 4 points  Total Score 4      Assessment & Plan:     Annual Wellness Visit  Reviewed patient's Family Medical History Reviewed and updated list of patient's medical providers Assessment of cognitive impairment was done Assessed patient's functional ability Established a written schedule for health screening [place] Completed and Reviewed  Exercise Activities and Dietary recommendations Goals    . Increase water intake          Continue drinking 8 glasses of water a day.        Immunization History  Administered Date(s) Administered  . Influenza Split 03/02/2012, 03/17/2013,  05/07/2014  . Influenza Whole 03/24/2007, 03/15/2009, 02/15/2010  . Influenza, High Dose Seasonal PF 05/04/2015, 03/21/2016  . Pneumococcal Conjugate-13 11/17/2013  . Pneumococcal Polysaccharide-23 11/19/2004, 03/19/2007, 03/17/2009  . Td 05/16/1997, 05/14/2007, 09/11/2010  . Zoster 01/02/2011    Health Maintenance  Topic Date Due  . INFLUENZA VACCINE  01/15/2017  . TETANUS/TDAP  09/10/2020  . PNA vac Low Risk Adult  Completed  Flu clinic in fall. Pt chooses cologard for colon screening. Discussed health benefits of physical activity, and encouraged him to engage in regular exercise appropriate for his age and condition.  I have done the exam and reviewed the chart and it is accurate to the best of my knowledge. Development worker, community has been used and  any errors in dictation or transcription are unintentional. Miguel Aschoff M.D. [facility]

## 2017-01-18 ENCOUNTER — Encounter: Payer: Self-pay | Admitting: Family Medicine

## 2017-01-27 DIAGNOSIS — H401132 Primary open-angle glaucoma, bilateral, moderate stage: Secondary | ICD-10-CM | POA: Diagnosis not present

## 2017-02-25 ENCOUNTER — Telehealth: Payer: Self-pay | Admitting: Family Medicine

## 2017-02-25 NOTE — Telephone Encounter (Signed)
Order for cologuard faxed to [facility] °

## 2017-03-04 DIAGNOSIS — Z1212 Encounter for screening for malignant neoplasm of rectum: Secondary | ICD-10-CM | POA: Diagnosis not present

## 2017-03-04 DIAGNOSIS — Z1211 Encounter for screening for malignant neoplasm of colon: Secondary | ICD-10-CM | POA: Diagnosis not present

## 2017-03-12 DIAGNOSIS — I82419 Acute embolism and thrombosis of unspecified femoral vein: Secondary | ICD-10-CM | POA: Diagnosis not present

## 2017-03-12 DIAGNOSIS — I2510 Atherosclerotic heart disease of native coronary artery without angina pectoris: Secondary | ICD-10-CM | POA: Insufficient documentation

## 2017-03-12 DIAGNOSIS — I25119 Atherosclerotic heart disease of native coronary artery with unspecified angina pectoris: Secondary | ICD-10-CM | POA: Diagnosis not present

## 2017-03-12 DIAGNOSIS — I479 Paroxysmal tachycardia, unspecified: Secondary | ICD-10-CM | POA: Diagnosis not present

## 2017-03-12 LAB — COLOGUARD: COLOGUARD: NEGATIVE

## 2017-03-24 ENCOUNTER — Ambulatory Visit (INDEPENDENT_AMBULATORY_CARE_PROVIDER_SITE_OTHER): Payer: PPO | Admitting: Family Medicine

## 2017-03-24 ENCOUNTER — Encounter: Payer: Self-pay | Admitting: Family Medicine

## 2017-03-24 VITALS — BP 132/78 | HR 66 | Temp 97.6°F | Resp 14 | Wt 172.0 lb

## 2017-03-24 DIAGNOSIS — R1310 Dysphagia, unspecified: Secondary | ICD-10-CM | POA: Diagnosis not present

## 2017-03-24 DIAGNOSIS — Z23 Encounter for immunization: Secondary | ICD-10-CM

## 2017-03-24 DIAGNOSIS — K2100 Gastro-esophageal reflux disease with esophagitis, without bleeding: Secondary | ICD-10-CM

## 2017-03-24 MED ORDER — OMEPRAZOLE 20 MG PO CPDR: 20.0000 mg | DELAYED_RELEASE_CAPSULE | Freq: Two times a day (BID) | ORAL | 3 refills | Status: DC

## 2017-03-24 NOTE — Progress Notes (Signed)
Patient: [name] Male    DOB: 03-05-1941   76 y.o.   MRN: [id number] Visit Date: 03/24/2017  Today's Provider: Wilhemena Durie, MD   Chief Complaint  Patient presents with  . Gastroesophageal Reflux   Subjective:    HPI Pt is here today because he has been having GERD symptoms for a "couple months" he reports that when ever he eats his esophagus feels tender and he can feel his food going just about all the way down. He reports that if he eats too fast he feels like the food gets stuck and he has pain. Denies any nausea, abdominal pain, chest pain. He says when ever he eats spicy foods he feels burning. He has not tried any OTC heart burn relief medications. He reports that he has a known hital hernia and wanted to know if this could be the cause.       Allergies  Allergen Reactions  . Timolol Maleate [Timolol Maleate] Shortness Of Breath  . Bactrim [Sulfamethoxazole-Trimethoprim]     Pt says "it takes his skin pigment off  . Budesonide-Formoterol Fumarate Other (See Comments)    SYMBICORT Patient reports dizziness and trembling/shaky feeling.  . Lisinopril     cough     Current Outpatient Prescriptions:  .  ADVAIR HFA 481-85 MCG/ACT inhaler, Inhale 2 puffs into the lungs 2 (two) times daily. , Disp: , Rfl:  .  albuterol (PROVENTIL HFA;VENTOLIN HFA) 108 (90 Base) MCG/ACT inhaler, Inhale into the lungs. 2 puffs twice daily, Disp: , Rfl:  .  aspirin EC 81 MG EC tablet, Take 1 tablet (81 mg total) by mouth daily., Disp: 30 tablet, Rfl: 0 .  atorvastatin (LIPITOR) 40 MG tablet, Take 1 tablet (40 mg total) by mouth daily at 6 PM., Disp: 90 tablet, Rfl: 3 .  brimonidine (ALPHAGAN) 0.15 % ophthalmic solution, Place 1 drop into both eyes 2 (two) times daily. , Disp: , Rfl:  .  Calcium Carb-Cholecalciferol (CALCIUM 600 + D PO), Take 1 tablet by mouth 2 (two) times daily., Disp: , Rfl:  .  diltiazem (CARDIZEM CD) 180 MG 24 hr capsule, Take 1 capsule (180 mg total) by mouth  daily., Disp: 90 capsule, Rfl: 3 .  dorzolamide (TRUSOPT) 2 % ophthalmic solution, Place 1 drop into both eyes 2 (two) times daily. , Disp: , Rfl:  .  gabapentin (NEURONTIN) 400 MG capsule, Take 400 mg by mouth at bedtime. , Disp: , Rfl:  .  isosorbide mononitrate (IMDUR) 30 MG 24 hr tablet, Take 1 tablet (30 mg total) by mouth daily., Disp: 90 tablet, Rfl: 3 .  latanoprost (XALATAN) 0.005 % ophthalmic solution, Place 1 drop into both eyes at bedtime. , Disp: , Rfl:  .  Multiple Vitamin (MULTIVITAMIN) tablet, Take 1 tablet by mouth daily., Disp: , Rfl:  .  tiotropium (SPIRIVA HANDIHALER) 18 MCG inhalation capsule, Place 18 mcg into inhaler and inhale daily. , Disp: , Rfl:  .  XARELTO 20 MG TABS tablet, Take 10 mg by mouth. , Disp: , Rfl:  .  diclofenac sodium (VOLTAREN) 1 % GEL, Apply 2 g topically 4 (four) times daily., Disp: , Rfl:  .  fluticasone (FLONASE) 50 MCG/ACT nasal spray, 2 sprays by Each Nare route Two (2) times a day., Disp: , Rfl:   Review of Systems  Constitutional: Negative.   HENT: Negative.   Eyes: Negative.   Respiratory: Negative.   Cardiovascular: Negative.   Endocrine: Negative.  Allergic/Immunologic: Negative.   Neurological: Negative.   Psychiatric/Behavioral: Negative.     Social History  Substance Use Topics  . Smoking status: Former Smoker    Packs/day: 0.50    Years: 3.00    Types: Cigarettes    Quit date: 06/17/1958  . Smokeless tobacco: Never Used     Comment: smoking while in college  . Alcohol use 1.2 - 1.8 oz/week    2 - 3 Glasses of wine per week   Objective:   BP 132/78 (BP Location: Left Arm, Patient Position: Sitting, Cuff Size: Normal)   Pulse 66   Temp 97.6 F (36.4 C) (Oral)   Resp 14   Wt 172 lb (78 kg)   BMI 22.08 kg/m  Vitals:   03/24/17 1052  BP: 132/78  Pulse: 66  Resp: 14  Temp: 97.6 F (36.4 C)  TempSrc: Oral  Weight: 172 lb (78 kg)     Physical Exam  Constitutional: He is oriented to person, place, and time. He  appears well-developed and well-nourished.  Eyes: Pupils are equal, round, and reactive to light. Conjunctivae and EOM are normal.  Neck: Normal range of motion. Neck supple.  Cardiovascular: Normal rate, regular rhythm, normal heart sounds and intact distal pulses.   Pulmonary/Chest: Effort normal and breath sounds normal.  Abdominal: Soft. Bowel sounds are normal.  Musculoskeletal: Normal range of motion.  Neurological: He is alert and oriented to person, place, and time. He has normal reflexes.  Skin: Skin is warm and dry.  Psychiatric: He has a normal mood and affect. His behavior is normal. Judgment and thought content normal.        Assessment & Plan:     1. Need for influenza vaccination  - Flu vaccine HIGH DOSE PF  2. Gastroesophageal reflux disease with esophagitis Start Omeprazole and follow up in 1 month. If not better may need GI referral  - omeprazole (PRILOSEC) 20 MG capsule; Take 1 capsule (20 mg total) by mouth 2 (two) times daily.  Dispense: 30 capsule; Refill: 3  3. Dysphagia, unspecified type Treat as above.  - omeprazole (PRILOSEC) 20 MG capsule; Take 1 capsule (20 mg total) by mouth 2 (two) times daily.  Dispense: 30 capsule; Refill: 3     HPI, Exam, and A&P Transcribed under the direction and in the presence of Darielle Hancher L. Cranford Mon, MD  Electronically Signed: Katina Dung, CMA  I have done the exam and reviewed the above chart and it is accurate to the best of my knowledge. Development worker, community has been used in this note in any air is in the dictation or transcription are unintentional.  Wilhemena Durie, MD  [place]

## 2017-03-26 DIAGNOSIS — J349 Unspecified disorder of nose and nasal sinuses: Secondary | ICD-10-CM | POA: Diagnosis not present

## 2017-04-07 ENCOUNTER — Emergency Department: Payer: PPO

## 2017-04-07 ENCOUNTER — Encounter: Payer: Self-pay | Admitting: Emergency Medicine

## 2017-04-07 ENCOUNTER — Telehealth: Payer: Self-pay

## 2017-04-07 ENCOUNTER — Emergency Department
Admission: EM | Admit: 2017-04-07 | Discharge: 2017-04-07 | Disposition: A | Discharge status: Home / Self Care | Payer: PPO | Attending: Emergency Medicine | Admitting: Emergency Medicine

## 2017-04-07 DIAGNOSIS — J181 Lobar pneumonia, unspecified organism: Secondary | ICD-10-CM | POA: Diagnosis not present

## 2017-04-07 DIAGNOSIS — J45909 Unspecified asthma, uncomplicated: Secondary | ICD-10-CM | POA: Diagnosis not present

## 2017-04-07 DIAGNOSIS — J189 Pneumonia, unspecified organism: Secondary | ICD-10-CM | POA: Diagnosis not present

## 2017-04-07 DIAGNOSIS — Z7982 Long term (current) use of aspirin: Secondary | ICD-10-CM | POA: Diagnosis not present

## 2017-04-07 DIAGNOSIS — Z79899 Other long term (current) drug therapy: Secondary | ICD-10-CM | POA: Diagnosis not present

## 2017-04-07 DIAGNOSIS — R509 Fever, unspecified: Secondary | ICD-10-CM | POA: Diagnosis present

## 2017-04-07 DIAGNOSIS — Z86718 Personal history of other venous thrombosis and embolism: Secondary | ICD-10-CM | POA: Diagnosis not present

## 2017-04-07 DIAGNOSIS — R000 Tachycardia, unspecified: Secondary | ICD-10-CM | POA: Diagnosis not present

## 2017-04-07 DIAGNOSIS — F17210 Nicotine dependence, cigarettes, uncomplicated: Secondary | ICD-10-CM | POA: Insufficient documentation

## 2017-04-07 LAB — INFLUENZA PANEL BY PCR (TYPE A & B)
INFLAPCR: NEGATIVE
INFLBPCR: NEGATIVE

## 2017-04-07 LAB — CBC
HCT: 51.6 % (ref 40.0–52.0)
Hemoglobin: 17.1 g/dL (ref 13.0–18.0)
MCH: 32.9 pg (ref 26.0–34.0)
MCHC: 33 g/dL (ref 32.0–36.0)
MCV: 99.6 fL (ref 80.0–100.0)
PLATELETS: 199 10*3/uL (ref 150–440)
RBC: 5.18 MIL/uL (ref 4.40–5.90)
RDW: 14.7 % — AB (ref 11.5–14.5)
WBC: 20.7 10*3/uL — AB (ref 3.8–10.6)

## 2017-04-07 LAB — PROCALCITONIN: Procalcitonin: 0.14 ng/mL

## 2017-04-07 LAB — BASIC METABOLIC PANEL
Anion gap: 9 (ref 5–15)
BUN: 16 mg/dL (ref 6–20)
CHLORIDE: 102 mmol/L (ref 101–111)
CO2: 26 mmol/L (ref 22–32)
CREATININE: 1.35 mg/dL — AB (ref 0.61–1.24)
Calcium: 9.4 mg/dL (ref 8.9–10.3)
GFR calc Af Amer: 56 mL/min — ABNORMAL LOW (ref >60)
GFR calc non Af Amer: 49 mL/min — ABNORMAL LOW (ref >60)
Glucose, Bld: 101 mg/dL — ABNORMAL HIGH (ref 65–99)
Potassium: 4.2 mmol/L (ref 3.5–5.1)
SODIUM: 137 mmol/L (ref 135–145)

## 2017-04-07 LAB — LACTIC ACID, PLASMA: Lactic Acid, Venous: 1.4 mmol/L (ref 0.5–1.9)

## 2017-04-07 MED ORDER — AZITHROMYCIN 500 MG PO TABS
500.0000 mg | ORAL_TABLET | Freq: Once | ORAL | Status: AC
  Administered 2017-04-07: 500 mg via ORAL
  Filled 2017-04-07: qty 1

## 2017-04-07 MED ORDER — SODIUM CHLORIDE 0.9 % IV BOLUS (SEPSIS)
1000.0000 mL | Freq: Once | INTRAVENOUS | Status: AC
  Administered 2017-04-07: 1000 mL via INTRAVENOUS

## 2017-04-07 MED ORDER — AMOXICILLIN 500 MG PO TABS: 1000.0000 mg | ORAL_TABLET | Freq: Two times a day (BID) | ORAL | 0 refills | Status: AC

## 2017-04-07 MED ORDER — PIPERACILLIN-TAZOBACTAM 3.375 G IVPB 30 MIN
3.3750 g | Freq: Once | INTRAVENOUS | Status: AC
  Administered 2017-04-07: 3.375 g via INTRAVENOUS
  Filled 2017-04-07: qty 50

## 2017-04-07 MED ORDER — AZITHROMYCIN 250 MG PO TABS: ORAL_TABLET | ORAL | 0 refills | Status: AC

## 2017-04-07 MED ORDER — VANCOMYCIN HCL IN DEXTROSE 1-5 GM/200ML-% IV SOLN
1000.0000 mg | Freq: Once | INTRAVENOUS | Status: DC
  Administered 2017-04-07: 1000 mg via INTRAVENOUS
  Filled 2017-04-07: qty 200

## 2017-04-07 MED ORDER — ONDANSETRON HCL 4 MG/2ML IJ SOLN
4.0000 mg | Freq: Once | INTRAMUSCULAR | Status: AC
  Administered 2017-04-07: 4 mg via INTRAVENOUS
  Filled 2017-04-07: qty 2

## 2017-04-07 NOTE — ED Notes (Signed)
Pt discharged to home.  Family member driving.  Discharge instructions reviewed.  Verbalized understanding.  No questions or concerns at this time.  Teach back verified.  Pt in NAD.  No items left in ED.   

## 2017-04-07 NOTE — Telephone Encounter (Signed)
Pt advised.   Thanks,   -Zahria Ding  

## 2017-04-07 NOTE — Discharge Instructions (Signed)
Please take both of your antibiotics as prescribed and follow up with your primary care physician tomorrow for a reexamination. Return to the emergency department sooner for any new or worsening symptoms such as worsening fever, shortness of breath, or for any other issues whatsoever.  It was a pleasure to take care of you today, and thank you for coming to our emergency department.  If you have any questions or concerns before leaving please ask the nurse to grab me and I'm more than happy to go through your aftercare instructions again.  If you were prescribed any opioid pain medication today such as Norco, Vicodin, Percocet, morphine, hydrocodone, or oxycodone please make sure you do not drive when you are taking this medication as it can alter your ability to drive safely.  If you have any concerns once you are home that you are not improving or are in fact getting worse before you can make it to your follow-up appointment, please do not hesitate to call 911 and come back for further evaluation.  Darel Hong, MD  Results for orders placed or performed during the hospital encounter of 95/09/32  Basic metabolic panel  Result Value Ref Range   Sodium 137 135 - 145 mmol/L   Potassium 4.2 3.5 - 5.1 mmol/L   Chloride 102 101 - 111 mmol/L   CO2 26 22 - 32 mmol/L   Glucose, Bld 101 (H) 65 - 99 mg/dL   BUN 16 6 - 20 mg/dL   Creatinine, Ser 1.35 (H) 0.61 - 1.24 mg/dL   Calcium 9.4 8.9 - 10.3 mg/dL   GFR calc non Af Amer 49 (L) >60 mL/min   GFR calc Af Amer 56 (L) >60 mL/min   Anion gap 9 5 - 15  CBC  Result Value Ref Range   WBC 20.7 (H) 3.8 - 10.6 K/uL   RBC 5.18 4.40 - 5.90 MIL/uL   Hemoglobin 17.1 13.0 - 18.0 g/dL   HCT 51.6 40.0 - 52.0 %   MCV 99.6 80.0 - 100.0 fL   MCH 32.9 26.0 - 34.0 pg   MCHC 33.0 32.0 - 36.0 g/dL   RDW 14.7 (H) 11.5 - 14.5 %   Platelets 199 150 - 440 K/uL  Lactic acid, plasma  Result Value Ref Range   Lactic Acid, Venous 1.4 0.5 - 1.9 mmol/L  Procalcitonin   Result Value Ref Range   Procalcitonin 0.14 ng/mL  Influenza panel by PCR (type A & B)  Result Value Ref Range   Influenza A By PCR NEGATIVE NEGATIVE   Influenza B By PCR NEGATIVE NEGATIVE   Dg Chest Port 1 View  Result Date: 04/07/2017 CLINICAL DATA:  76 year old male with weakness since this morning. Nausea and vomiting this evening. Fever to 101.5 degrees. EXAM: PORTABLE CHEST 1 VIEW COMPARISON:  Chest x-ray 11/05/2016. FINDINGS: Airspace consolidation in the medial aspect of the right lower lobe concerning for pneumonia. Probable subsegmental atelectasis or scarring in the medial aspect of the left lower lobe. No pleural effusions. No evidence of pulmonary edema. Heart size is upper limits of normal. Upper mediastinal contours are within normal limits. Aortic atherosclerosis. IMPRESSION: 1. Right lower lobe pneumonia. Followup PA and lateral chest X-ray is recommended in 3-4 weeks following trial of antibiotic therapy to ensure resolution and exclude underlying malignancy. 2. Probable scarring or subsegmental atelectasis in the medial left lower lobe. 3. Aortic atherosclerosis. Electronically Signed   By: Vinnie Langton M.D.   On: 04/07/2017 20:11

## 2017-04-07 NOTE — Telephone Encounter (Signed)
-----   Message from Jerrol Banana., MD sent at 04/07/2017  8:29 AM EDT ----- cologard negative.

## 2017-04-07 NOTE — ED Provider Notes (Signed)
[facility] Emergency Department Provider Note  ____________________________________________   First MD Initiated Contact with Patient 04/07/17 1912     (approximate)  I have reviewed the triage vital signs and the nursing notes.   HISTORY  Chief Complaint Fever and Weakness   HPI [name] is a 76 y.o. male who presents to the emergency department with roughly 24 hours of generalized malaise. His wife checked temperature at home was 103 so she brought him to the [facility].  When he walked into clinic he was 101.5 and they sent him to the emergency department. He does report some cough for the past several days although he has a history of COPD. He does not use oxygen at home. He denies chest pain. She does have some nausea and has vomited several times although no abdominal pain whatsoever. His symptoms have been insidious in onset and slowly progressive. Nothing in particular seems to make it better or worse.   Past Medical History:  Diagnosis Date  . AB (asthmatic bronchitis)   . Allergic rhinitis   . Arthropathy   . Asthma   . Cellulitis   . DVT (deep venous thrombosis) ([place])   . Foreign body in skin   . Glaucoma   . Hx MRSA infection   . Hx MRSA infection   . Hypertension   . Insomnia   . Insomnia   . Mitral valve disorder   . Osteoarthrosis   . Upper back strain     Patient Active Problem List   Diagnosis Date Noted  . Sepsis ([place]) 11/05/2016  . Diverticulitis 11/05/2016  . History of deep vein thrombosis (DVT) of lower extremity 11/05/2016  . HCAP (healthcare-associated pneumonia) 08/29/2016  . NSTEMI (non-ST elevated myocardial infarction) ([place]) 08/18/2016  . Chronic obstructive pulmonary disease ([place]) 01/02/2016  . DVT, femoral, acute ([place]) 05/15/2015  . Recurrent pneumonia 12/21/2014  . Basal cell carcinoma 10/19/2014  . Abnormal liver enzymes 10/19/2014  . Personal history of methicillin resistant Staphylococcus  aureus 10/19/2014  . Cannot sleep 10/19/2014  . Heel pain 10/19/2014  . Pain in shoulder 10/19/2014  . Back strain 10/19/2014  . Pulmonary aspergillosis, allergic bronchopulmonary type ([place]) 07/30/2014  . ABPA (allergic bronchopulmonary aspergillosis) ([place]) 07/30/2014  . CAP (community acquired pneumonia) 05/03/2014  . Abnormal CXR 04/01/2014  . Paroxysmal tachycardia ([place]) 06/23/2013  . Asymptomatic PVCs 06/23/2013  . Beat, premature ventricular 06/23/2013  . Cough 10/19/2012  . Allergic rhinitis 01/17/2009  . Asthma, chronic obstructive, without status asthmaticus ([place]) 11/04/2008  . CAFL (chronic airflow limitation) ([place]) 11/04/2008  . Essential hypertension 02/09/2008  . Arthropathia 08/28/2007  . Disorder of mitral valve 07/08/2005  . Benign prostatic hypertrophy without urinary obstruction 05/16/1997  . LBP (low back pain) 05/16/1997  . Degenerative arthritis of hip 03/18/1995    Past Surgical History:  Procedure Laterality Date  . APPENDECTOMY    . BRONCHOSCOPY    . LEFT HEART CATH AND CORONARY ANGIOGRAPHY N/A 08/19/2016   Procedure: Left Heart Cath and Coronary Angiography;  Surgeon: Dionisio David, MD;  Location: [facility];  Service: Cardiovascular;  Laterality: N/A;  . NASAL SINUS SURGERY  09/27/2014    Prior to Admission medications   Medication Sig Start Date End Date Taking? Authorizing Provider  ADVAIR HFA 230-21 MCG/ACT inhaler Inhale 2 puffs into the lungs 2 (two) times daily.  11/17/15   [provider]  albuterol (PROVENTIL HFA;VENTOLIN HFA) 108 (90 Base) MCG/ACT inhaler Inhale into the lungs.  2 puffs twice daily 12/26/16 12/26/17  [provider]  amoxicillin (AMOXIL) 500 MG tablet Take 2 tablets (1,000 mg total) by mouth 2 (two) times daily. 04/07/17 04/14/17  Darel Hong, MD  aspirin EC 81 MG EC tablet Take 1 tablet (81 mg total) by mouth daily. 08/21/16   Loletha Grayer, MD  atorvastatin (LIPITOR) 40 MG tablet Take 1 tablet (40  mg total) by mouth daily at 6 PM. 09/10/16   Jerrol Banana., MD  azithromycin (ZITHROMAX Z-PAK) 250 MG tablet Take 2 tablets (500 mg) on  Day 1,  followed by 1 tablet (250 mg) once daily on Days 2 through 5. 04/07/17 04/12/17  Darel Hong, MD  brimonidine (ALPHAGAN) 0.15 % ophthalmic solution Place 1 drop into both eyes 2 (two) times daily.  10/26/15   [provider]  Calcium Carb-Cholecalciferol (CALCIUM 600 + D PO) Take 1 tablet by mouth 2 (two) times daily.    [provider]  diclofenac sodium (VOLTAREN) 1 % GEL Apply 2 g topically 4 (four) times daily.    [provider]  diltiazem (CARDIZEM CD) 180 MG 24 hr capsule Take 1 capsule (180 mg total) by mouth daily. 09/10/16   Jerrol Banana., MD  dorzolamide (TRUSOPT) 2 % ophthalmic solution Place 1 drop into both eyes 2 (two) times daily.  08/31/15   [provider]  gabapentin (NEURONTIN) 400 MG capsule Take 400 mg by mouth at bedtime.  09/18/15   [provider]  isosorbide mononitrate (IMDUR) 30 MG 24 hr tablet Take 1 tablet (30 mg total) by mouth daily. 09/10/16   Jerrol Banana., MD  latanoprost (XALATAN) 0.005 % ophthalmic solution Place 1 drop into both eyes at bedtime.     [provider]  Multiple Vitamin (MULTIVITAMIN) tablet Take 1 tablet by mouth daily.    [provider]  omeprazole (PRILOSEC) 20 MG capsule Take 1 capsule (20 mg total) by mouth 2 (two) times daily. 03/24/17   Jerrol Banana., MD  tiotropium (SPIRIVA HANDIHALER) 18 MCG inhalation capsule Place 18 mcg into inhaler and inhale daily.  02/06/15 08/18/17  [provider]  XARELTO 20 MG TABS tablet Take 10 mg by mouth.  08/05/16   [provider]    Allergies Timolol maleate [timolol maleate]; Bactrim [sulfamethoxazole-trimethoprim]; Budesonide-formoterol fumarate; and Lisinopril  Family History  Problem Relation Age of Onset  . Breast cancer Sister   . Heart failure  Mother   . Pancreatic cancer Sister   . Alzheimer's disease Father   . Brain cancer Daughter     Social History Social History  Substance Use Topics  . Smoking status: Former Smoker    Packs/day: 0.50    Years: 3.00    Types: Cigarettes    Quit date: 06/17/1958  . Smokeless tobacco: Never Used     Comment: smoking while in college  . Alcohol use 1.2 - 1.8 oz/week    2 - 3 Glasses of wine per week    Review of Systems Constitutional: positive for fevers Eyes: No visual changes. ENT: No sore throat. Cardiovascular: Denies chest pain. Respiratory: positive for shortness of breath. Gastrointestinal: No abdominal pain.  Positive for nausea, positive for vomiting.  No diarrhea.  No constipation. Genitourinary: Negative for dysuria. Musculoskeletal: Negative for back pain. Skin: Negative for rash. Neurological: Negative for headaches, focal weakness or numbness.   ____________________________________________   PHYSICAL EXAM:  VITAL SIGNS: ED Triage Vitals  Enc Vitals Group  BP 04/07/17 1909 124/61     Pulse Rate 04/07/17 1909 (!) 101     Resp 04/07/17 1909 17     Temp 04/07/17 1909 99.7 F (37.6 C)     Temp Source 04/07/17 1909 Oral     SpO2 04/07/17 1909 94 %     Weight 04/07/17 1906 172 lb (78 kg)     Height --      Head Circumference --      Peak Flow --      Pain Score --      Pain Loc --      Pain Edu? --      Excl. in [place]? --     Constitutional: alert and oriented 4 appears somewhat uncomfortable nontoxic no diaphoresis speaks in full clear sentences Eyes: PERRL EOMI. Head: Atraumatic. Nose: No congestion/rhinnorhea. Mouth/Throat: No trismus Neck: No stridor.   Cardiovascular: tachycardic rate, regular rhythm. Grossly normal heart sounds.  Good peripheral circulation. Respiratory: slightly increasedrespiratory effort.  No retractions. rhonchi in bilateral lower lobes although moving good air Gastrointestinal: soft nondistended nontender no rebound or  guarding no peritonitis no McBurney's tenderness negative Rovsing's no costovertebral tenderness Musculoskeletal: No lower extremity edema   Neurologic:  Normal speech and language. No gross focal neurologic deficits are appreciated. Skin:  Skin is warm, dry and intact. No rash noted. Psychiatric: Mood and affect are normal. Speech and behavior are normal.    ____________________________________________   DIFFERENTIAL includes but not limited to  sepsis, pneumonia, COPD exacerbation, bacteremia, urinary tract infection, appendicitis, diverticulitis ____________________________________________   LABS (all labs ordered are listed, but only abnormal results are displayed)  Labs Reviewed  BASIC METABOLIC PANEL - Abnormal; Notable for the following:       Result Value   Glucose, Bld 101 (*)    Creatinine, Ser 1.35 (*)    GFR calc non Af Amer 49 (*)    GFR calc Af Amer 56 (*)    All other components within normal limits  CBC - Abnormal; Notable for the following:    WBC 20.7 (*)    RDW 14.7 (*)    All other components within normal limits  CULTURE, BLOOD (ROUTINE X 2)  CULTURE, BLOOD (ROUTINE X 2)  LACTIC ACID, PLASMA  PROCALCITONIN  INFLUENZA PANEL BY PCR (TYPE A & B)  URINALYSIS, COMPLETE (UACMP) WITH MICROSCOPIC  LACTIC ACID, PLASMA  CBG MONITORING, ED    blood work reviewed and interpreted by me shows elevated white count which is nonspecific but likely secondary to infection __________________________________________  EKG  ED ECG REPORT I, Darel Hong, the attending physician, personally viewed and interpreted this ECG.  Date: 04/07/2017 EKG Time:  Rate: 103 Rhythm: sinus tachycardia QRS Axis: normal Intervals: normal ST/T Wave abnormalities: normal Narrative Interpretation: no evidence of acute ischemia  ____________________________________________  RADIOLOGY  chest x-ray reviewed by me shows right lower lobe  pneumonia ____________________________________________   PROCEDURES  Procedure(s) performed: no  Procedures  Critical Care performed: no  Observation: no ____________________________________________   INITIAL IMPRESSION / ASSESSMENT AND PLAN / ED COURSE  Pertinent labs & imaging results that were available during my care of the patient were reviewed by me and considered in my medical decision making (see chart for details).  the patient arrives febrile with a low-grade temperature concerning for sepsis. Blood work reveals an elevated white count so given him Zosyn and vancomycin for sepsis of unclear etiology. Broad labs as well as an influenza swab are pending.  After fluids  and antibiotics the patient feels markedly improved. He is saturating 94% on room air which given his history of COPD is actually quite good. He would prefer to go home which I think is reasonable. He has a clear source of right lower lobe pneumonia and is influenza negative. He was last hospitalized 5 months ago so this is community-acquired. We'll discharge him with both amoxicillin for strep pneumo coverage as well as azithromycin for atypical coverage. The patient understands he is to follow-up with his primary care physician tomorrow for reexamination and to return sooner for any concerns. The patient and his wife verbalized understanding and agreement with plan.      ____________________________________________   FINAL CLINICAL IMPRESSION(S) / ED DIAGNOSES  Final diagnoses:  Community acquired pneumonia of right lower lobe of lung ([place])      NEW MEDICATIONS STARTED DURING THIS VISIT:  New Prescriptions   AMOXICILLIN (AMOXIL) 500 MG TABLET    Take 2 tablets (1,000 mg total) by mouth 2 (two) times daily.   AZITHROMYCIN (ZITHROMAX Z-PAK) 250 MG TABLET    Take 2 tablets (500 mg) on  Day 1,  followed by 1 tablet (250 mg) once daily on Days 2 through 5.     Note:  This document was prepared using  Dragon voice recognition software and may include unintentional dictation errors.     Darel Hong, MD 04/07/17 2103

## 2017-04-07 NOTE — ED Triage Notes (Signed)
Pt c/o weakness since waking this AM, started to have N/V this evening and went to [facility] where they brought pt to ED for further evaluation due to fever of 101.39F and O2 90%. Pt taken to [facility] 13.

## 2017-04-08 ENCOUNTER — Telehealth: Payer: Self-pay | Admitting: Family Medicine

## 2017-04-08 NOTE — Telephone Encounter (Signed)
Wed or Thurs between PEs.

## 2017-04-08 NOTE — Telephone Encounter (Signed)
Pt's wife Everlene Farrier stated that pt was in Ed last night and they advised for pt to follow up with Dr. Rosanna Randy. Everlene Farrier is requesting a call back to see when pt could be worked in this week. Please advise. Thanks TNP

## 2017-04-08 NOTE — Telephone Encounter (Signed)
Please review-[name] [name], RMA  

## 2017-04-09 ENCOUNTER — Ambulatory Visit (INDEPENDENT_AMBULATORY_CARE_PROVIDER_SITE_OTHER): Payer: PPO | Admitting: Family Medicine

## 2017-04-09 VITALS — BP 98/60 | HR 80 | Temp 98.2°F | Resp 14 | Wt 170.4 lb

## 2017-04-09 DIAGNOSIS — K2100 Gastro-esophageal reflux disease with esophagitis, without bleeding: Secondary | ICD-10-CM

## 2017-04-09 DIAGNOSIS — J181 Lobar pneumonia, unspecified organism: Secondary | ICD-10-CM

## 2017-04-09 DIAGNOSIS — J189 Pneumonia, unspecified organism: Secondary | ICD-10-CM

## 2017-04-09 LAB — BLOOD CULTURE ID PANEL (REFLEXED)
ACINETOBACTER BAUMANNII: NOT DETECTED
CANDIDA ALBICANS: NOT DETECTED
CANDIDA GLABRATA: NOT DETECTED
CANDIDA TROPICALIS: NOT DETECTED
Candida krusei: NOT DETECTED
Candida parapsilosis: NOT DETECTED
ENTEROBACTER CLOACAE COMPLEX: NOT DETECTED
ENTEROBACTERIACEAE SPECIES: NOT DETECTED
Enterococcus species: NOT DETECTED
Escherichia coli: NOT DETECTED
Haemophilus influenzae: NOT DETECTED
KLEBSIELLA PNEUMONIAE: NOT DETECTED
Klebsiella oxytoca: NOT DETECTED
LISTERIA MONOCYTOGENES: NOT DETECTED
NEISSERIA MENINGITIDIS: NOT DETECTED
PSEUDOMONAS AERUGINOSA: NOT DETECTED
Proteus species: NOT DETECTED
STREPTOCOCCUS AGALACTIAE: NOT DETECTED
STREPTOCOCCUS PNEUMONIAE: NOT DETECTED
STREPTOCOCCUS PYOGENES: NOT DETECTED
Serratia marcescens: NOT DETECTED
Staphylococcus aureus (BCID): NOT DETECTED
Staphylococcus species: NOT DETECTED
Streptococcus species: NOT DETECTED

## 2017-04-09 NOTE — Telephone Encounter (Signed)
Spoke with Mrs. Blakney and pt is scheduled for f/u this morning 04/09/17 @ 10:45 am. Thanks TNP

## 2017-04-09 NOTE — Progress Notes (Signed)
[name]  MRN: [id number] DOB: Oct 08, 1940  Subjective:  HPI  Patient is here to follow up on ER visit on 04/07/17 and was diagnosed with Community acquired pneumonia of the right lower lobe of lung. Flu test was done there and it was negative. Patient was started on Amoxil and Zpak. Blood work, EKG and Chest Xray was done.  Patient states he is taking antibiotics still. His weakness is better and he is not having any more chest soreness like he did. No cough or shortness of breath. He has not been checking his temperature-he had a low grade temperature at ER.  Patient Active Problem List   Diagnosis Date Noted  . Sepsis ([place]) 11/05/2016  . Diverticulitis 11/05/2016  . History of deep vein thrombosis (DVT) of lower extremity 11/05/2016  . HCAP (healthcare-associated pneumonia) 08/29/2016  . NSTEMI (non-ST elevated myocardial infarction) ([place]) 08/18/2016  . Chronic obstructive pulmonary disease ([place]) 01/02/2016  . DVT, femoral, acute ([place]) 05/15/2015  . Recurrent pneumonia 12/21/2014  . Basal cell carcinoma 10/19/2014  . Abnormal liver enzymes 10/19/2014  . Personal history of methicillin resistant Staphylococcus aureus 10/19/2014  . Cannot sleep 10/19/2014  . Heel pain 10/19/2014  . Pain in shoulder 10/19/2014  . Back strain 10/19/2014  . Pulmonary aspergillosis, allergic bronchopulmonary type ([place]) 07/30/2014  . ABPA (allergic bronchopulmonary aspergillosis) ([place]) 07/30/2014  . CAP (community acquired pneumonia) 05/03/2014  . Abnormal CXR 04/01/2014  . Paroxysmal tachycardia ([place]) 06/23/2013  . Asymptomatic PVCs 06/23/2013  . Beat, premature ventricular 06/23/2013  . Cough 10/19/2012  . Allergic rhinitis 01/17/2009  . Asthma, chronic obstructive, without status asthmaticus ([place]) 11/04/2008  . CAFL (chronic airflow limitation) ([place]) 11/04/2008  . Essential hypertension 02/09/2008  . Arthropathia 08/28/2007  . Disorder of mitral valve 07/08/2005  . Benign prostatic  hypertrophy without urinary obstruction 05/16/1997  . LBP (low back pain) 05/16/1997  . Degenerative arthritis of hip 03/18/1995    Past Medical History:  Diagnosis Date  . AB (asthmatic bronchitis)   . Allergic rhinitis   . Arthropathy   . Asthma   . Cellulitis   . DVT (deep venous thrombosis) ([place])   . Foreign body in skin   . Glaucoma   . Hx MRSA infection   . Hx MRSA infection   . Hypertension   . Insomnia   . Insomnia   . Mitral valve disorder   . Osteoarthrosis   . Upper back strain     Social History   Social History  . Marital status: Married    Spouse name: Everlene Farrier  . Number of children: 2  . Years of education: College   Occupational History  . Retired Retired    Chief Financial Officer   Social History Main Topics  . Smoking status: Former Smoker    Packs/day: 0.50    Years: 3.00    Types: Cigarettes    Quit date: 06/17/1958  . Smokeless tobacco: Never Used     Comment: smoking while in college  . Alcohol use 1.2 - 1.8 oz/week    2 - 3 Glasses of wine per week  . Drug use: No  . Sexual activity: Not on file   Other Topics Concern  . Not on file   Social History Narrative   He is a married father of 2 daughters.   He smoked casually while in college for about 3 years less than a pack a day.   He drinks 3-4 glass of wine at night. He also has about  2 servings of coffee a day   He is a very active gentleman with [employer] exercise program at least 3-4 days a week.   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   In addition to this standing his exercise, he does lots of gardening and uses a chain saw to cut wood.             Outpatient Encounter Prescriptions as of 04/09/2017  Medication Sig  . ADVAIR HFA 230-21 MCG/ACT inhaler Inhale 2 puffs into the lungs 2 (two) times daily.   Marland Kitchen albuterol (PROVENTIL HFA;VENTOLIN HFA) 108 (90 Base) MCG/ACT inhaler Inhale into the lungs. 2 puffs twice daily  . amoxicillin (AMOXIL) 500 MG tablet  Take 2 tablets (1,000 mg total) by mouth 2 (two) times daily.  Marland Kitchen aspirin EC 81 MG EC tablet Take 1 tablet (81 mg total) by mouth daily.  Marland Kitchen atorvastatin (LIPITOR) 40 MG tablet Take 1 tablet (40 mg total) by mouth daily at 6 PM.  . azithromycin (ZITHROMAX Z-PAK) 250 MG tablet Take 2 tablets (500 mg) on  Day 1,  followed by 1 tablet (250 mg) once daily on Days 2 through 5.  . brimonidine (ALPHAGAN) 0.15 % ophthalmic solution Place 1 drop into both eyes 2 (two) times daily.   . Calcium Carb-Cholecalciferol (CALCIUM 600 + D PO) Take 1 tablet by mouth 2 (two) times daily.  Marland Kitchen diltiazem (CARDIZEM CD) 180 MG 24 hr capsule Take 1 capsule (180 mg total) by mouth daily.  . dorzolamide (TRUSOPT) 2 % ophthalmic solution Place 1 drop into both eyes 2 (two) times daily.   Marland Kitchen gabapentin (NEURONTIN) 400 MG capsule Take 400 mg by mouth at bedtime.   . isosorbide mononitrate (IMDUR) 30 MG 24 hr tablet Take 1 tablet (30 mg total) by mouth daily.  Marland Kitchen latanoprost (XALATAN) 0.005 % ophthalmic solution Place 1 drop into both eyes at bedtime.   . Multiple Vitamin (MULTIVITAMIN) tablet Take 1 tablet by mouth daily.  Marland Kitchen omeprazole (PRILOSEC) 20 MG capsule Take 1 capsule (20 mg total) by mouth 2 (two) times daily.  Marland Kitchen tiotropium (SPIRIVA HANDIHALER) 18 MCG inhalation capsule Place 18 mcg into inhaler and inhale daily.   Alveda Reasons 20 MG TABS tablet Take 10 mg by mouth.   . [DISCONTINUED] diclofenac sodium (VOLTAREN) 1 % GEL Apply 2 g topically 4 (four) times daily.   No facility-administered encounter medications on file as of 04/09/2017.     Allergies  Allergen Reactions  . Timolol Maleate [Timolol Maleate] Shortness Of Breath  . Bactrim [Sulfamethoxazole-Trimethoprim]     Pt says "it takes his skin pigment off  . Budesonide-Formoterol Fumarate Other (See Comments)    SYMBICORT Patient reports dizziness and trembling/shaky feeling.  . Lisinopril     cough    Review of Systems  Constitutional: Positive for  malaise/fatigue. Negative for chills and fever.  HENT: Negative.   Eyes: Negative.   Respiratory: Negative.   Cardiovascular: Negative.   Gastrointestinal: Negative.   Skin: Negative.   Neurological: Positive for weakness. Negative for dizziness and headaches.  Psychiatric/Behavioral: Negative.     Objective:  BP 98/60   Pulse 80   Temp 98.2 F (36.8 C)   Resp 14   Wt 170 lb 6.4 oz (77.3 kg)   SpO2 94%   BMI 21.88 kg/m   Physical Exam  Constitutional: He is oriented to person, place, and time and well-developed, well-nourished, and in no distress.  HENT:  Head:  Normocephalic and atraumatic.  Right Ear: External ear normal.  Left Ear: External ear normal.  Nose: Nose normal.  Eyes: Pupils are equal, round, and reactive to light. Conjunctivae are normal. No scleral icterus.  Neck: Normal range of motion. Neck supple.  Cardiovascular: Normal rate, regular rhythm, normal heart sounds and intact distal pulses.  Exam reveals no gallop.   No murmur heard. Pulmonary/Chest: Effort normal and breath sounds normal. No respiratory distress. He has no wheezes.  Abdominal: Soft.  Musculoskeletal: He exhibits no edema or tenderness.  Neurological: He is alert and oriented to person, place, and time. Gait normal. GCS score is 15.  Skin: Skin is warm and dry.  Psychiatric: Mood, memory, affect and judgment normal.   Assessment and Plan :  1. Community acquired pneumonia of right lower lobe of lung ([place]) Records reviewed. Patient is feeling better. Patient advised to finish antibiotics he was given. Will follow up on the next visit and repeat chest xray at that time.  2. Gastroesophageal reflux disease with esophagitis  HPI, Exam and A&P transcribed by Tiffany Kocher, RMA under direction and in the presence of Miguel Aschoff, MD. I have done the exam and reviewed the chart and it is accurate to the best of my knowledge. Development worker, community has been used and  any errors in dictation or  transcription are unintentional. Miguel Aschoff M.D. [facility]

## 2017-04-12 ENCOUNTER — Telehealth: Payer: Self-pay

## 2017-04-12 ENCOUNTER — Encounter: Payer: Self-pay | Admitting: Emergency Medicine

## 2017-04-12 ENCOUNTER — Emergency Department
Admission: EM | Admit: 2017-04-12 | Discharge: 2017-04-12 | Disposition: A | Discharge status: Home / Self Care | Payer: PPO | Attending: Emergency Medicine | Admitting: Emergency Medicine

## 2017-04-12 DIAGNOSIS — R7881 Bacteremia: Secondary | ICD-10-CM | POA: Diagnosis not present

## 2017-04-12 DIAGNOSIS — J449 Chronic obstructive pulmonary disease, unspecified: Secondary | ICD-10-CM | POA: Insufficient documentation

## 2017-04-12 DIAGNOSIS — Z7982 Long term (current) use of aspirin: Secondary | ICD-10-CM | POA: Diagnosis not present

## 2017-04-12 DIAGNOSIS — Z79899 Other long term (current) drug therapy: Secondary | ICD-10-CM | POA: Insufficient documentation

## 2017-04-12 DIAGNOSIS — I10 Essential (primary) hypertension: Secondary | ICD-10-CM | POA: Diagnosis not present

## 2017-04-12 DIAGNOSIS — J45909 Unspecified asthma, uncomplicated: Secondary | ICD-10-CM | POA: Diagnosis not present

## 2017-04-12 DIAGNOSIS — Z87891 Personal history of nicotine dependence: Secondary | ICD-10-CM | POA: Insufficient documentation

## 2017-04-12 DIAGNOSIS — Z7901 Long term (current) use of anticoagulants: Secondary | ICD-10-CM | POA: Diagnosis not present

## 2017-04-12 DIAGNOSIS — R799 Abnormal finding of blood chemistry, unspecified: Secondary | ICD-10-CM | POA: Diagnosis present

## 2017-04-12 MED ORDER — SULFAMETHOXAZOLE-TRIMETHOPRIM 800-160 MG PO TABS: 1.0000 | ORAL_TABLET | Freq: Once | ORAL | Status: DC

## 2017-04-12 MED ORDER — DOXYCYCLINE HYCLATE 100 MG PO TABS
100.0000 mg | ORAL_TABLET | Freq: Once | ORAL | Status: AC
Start: 2017-04-12 — End: 2017-04-12
  Administered 2017-04-12: 100 mg via ORAL
  Filled 2017-04-12: qty 1

## 2017-04-12 MED ORDER — DOXYCYCLINE HYCLATE 50 MG PO CAPS: 100.0000 mg | ORAL_CAPSULE | Freq: Two times a day (BID) | ORAL | 0 refills | Status: AC

## 2017-04-12 NOTE — ED Provider Notes (Signed)
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  We will call the patient to return to the emergency department given his bacteremia.   Darel Hong, MD 04/12/17 [phone]

## 2017-04-12 NOTE — Progress Notes (Signed)
ED Antimicrobial Stewardship Positive Culture Follow Up   [name] is an 76 y.o. male who presented to [facility] on 04/07/2017 with a chief complaint of fever and malaise Chief Complaint  Patient presents with  . Fever  . Weakness    Recent Results (from the past 720 hour(s))  Blood Culture (routine x 2)     Status: Abnormal (Preliminary result)   Collection Time: 04/07/17  7:31 PM  Result Value Ref Range Status   Specimen Description BLOOD LEFT ARM  Final   Special Requests   Final    BOTTLES DRAWN AEROBIC AND ANAEROBIC Blood Culture adequate volume   Culture  Setup Time   Final    AEROBIC BOTTLE ONLY GRAM NEGATIVE RODS CRITICAL RESULT CALLED TO, READ BACK BY AND VERIFIED WITH: DAVID BESANTI ON 04/09/17 AT 0102 [facility]    Culture (A)  Final    BURKHOLDERIA SPECIES SUSCEPTIBILITIES TO FOLLOW Performed at [facility], [place] [address], [place], [place] [address]    Report Status PENDING  Incomplete  Blood Culture (routine x 2)     Status: Abnormal (Preliminary result)   Collection Time: 04/07/17  7:31 PM  Result Value Ref Range Status   Specimen Description BLOOD RIGHT ARM  Final   Special Requests   Final    BOTTLES DRAWN AEROBIC AND ANAEROBIC Blood Culture adequate volume   Culture  Setup Time   Final    GRAM NEGATIVE RODS AEROBIC BOTTLE ONLY CRITICAL VALUE NOTED.  VALUE IS CONSISTENT WITH PREVIOUSLY REPORTED AND CALLED VALUE.    Culture (A)  Final    BURKHOLDERIA SPECIES SUSCEPTIBILITIES TO FOLLOW Performed at [facility], [place] [address] [place],  [address]    Report Status PENDING  Incomplete  Blood Culture ID Panel (Reflexed)     Status: None   Collection Time: 04/07/17  7:31 PM  Result Value Ref Range Status   Enterococcus species NOT DETECTED NOT DETECTED Final   Listeria monocytogenes NOT DETECTED NOT DETECTED Final   Staphylococcus species NOT DETECTED NOT DETECTED Final   Staphylococcus aureus NOT DETECTED NOT DETECTED Final   Streptococcus species NOT DETECTED NOT DETECTED Final   Streptococcus agalactiae NOT DETECTED NOT DETECTED Final   Streptococcus pneumoniae NOT DETECTED NOT DETECTED Final   Streptococcus pyogenes NOT DETECTED NOT DETECTED Final   Acinetobacter baumannii NOT DETECTED NOT DETECTED Final   Enterobacteriaceae species NOT DETECTED NOT DETECTED Final   Enterobacter cloacae complex NOT DETECTED NOT DETECTED Final   Escherichia coli NOT DETECTED NOT DETECTED Final   Klebsiella oxytoca NOT DETECTED NOT DETECTED Final   Klebsiella pneumoniae NOT DETECTED NOT DETECTED Final   Proteus species NOT DETECTED NOT DETECTED Final   Serratia marcescens NOT DETECTED NOT DETECTED Final   Haemophilus influenzae NOT DETECTED NOT DETECTED Final   Neisseria meningitidis NOT DETECTED NOT DETECTED Final   Pseudomonas aeruginosa NOT DETECTED NOT DETECTED Final   Candida albicans NOT DETECTED NOT DETECTED Final   Candida glabrata NOT DETECTED NOT DETECTED Final   Candida krusei NOT DETECTED NOT DETECTED Final   Candida parapsilosis NOT DETECTED NOT DETECTED Final   Candida tropicalis NOT DETECTED NOT DETECTED Final    [x]  Treated with amoxicillin and azithromycin, organism resistant to prescribed antimicrobial  Patient growing 2/2 burkholderia species. Dr. Darel Hong aware and per note, will call patient to return to ED for further treatment.   New antibiotic prescription: None  ED Provider: Earl Many,  PharmD., BCPS Clinical Pharmacist Pager [phone]

## 2017-04-12 NOTE — ED Triage Notes (Signed)
States was called in due to positive blood cultures. States was originally seen for pneumonia. Denies fevers since going home. Has been taking antibiotics as prescribed.

## 2017-04-12 NOTE — Discharge Instructions (Addendum)
Please take all of your antibiotics as prescribed and make an appointment to follow-up with your primary care physician this coming Friday for reevaluation.  Return to the emergency department sooner for any new or worsening symptoms whatsoever such as fevers, chills, if you cannot eat or drink, or for any other concerns.  It was a pleasure to take care of you today, and thank you for coming to our emergency department.  If you have any questions or concerns before leaving please ask the nurse to grab me and I'm more than happy to go through your aftercare instructions again.  If you were prescribed any opioid pain medication today such as Norco, Vicodin, Percocet, morphine, hydrocodone, or oxycodone please make sure you do not drive when you are taking this medication as it can alter your ability to drive safely.  If you have any concerns once you are home that you are not improving or are in fact getting worse before you can make it to your follow-up appointment, please do not hesitate to call 911 and come back for further evaluation.  Darel Hong, MD  Results for orders placed or performed during the hospital encounter of 04/07/17  Blood Culture (routine x 2)  Result Value Ref Range   Specimen Description BLOOD LEFT ARM    Special Requests      BOTTLES DRAWN AEROBIC AND ANAEROBIC Blood Culture adequate volume   Culture  Setup Time      AEROBIC BOTTLE ONLY GRAM NEGATIVE RODS CRITICAL RESULT CALLED TO, READ BACK BY AND VERIFIED WITH: DAVID BESANTI ON 04/09/17 AT 0102 [facility]    Culture (A)     [place] Performed at [facility], [place] [address], [place], [place] [address]    Report Status PENDING   Blood Culture (routine x 2)  Result Value Ref Range   Specimen Description BLOOD RIGHT ARM    Special Requests      BOTTLES DRAWN AEROBIC AND ANAEROBIC Blood Culture adequate volume   Culture  Setup Time      GRAM NEGATIVE RODS AEROBIC BOTTLE  ONLY CRITICAL VALUE NOTED.  VALUE IS CONSISTENT WITH PREVIOUSLY REPORTED AND CALLED VALUE.    Culture (A)     BURKHOLDERIA SPECIES SUSCEPTIBILITIES TO FOLLOW Performed at [facility], [place] [address] [place], [place] [address]    Report Status PENDING   Blood Culture ID Panel (Reflexed)  Result Value Ref Range   Enterococcus species NOT DETECTED NOT DETECTED   Listeria monocytogenes NOT DETECTED NOT DETECTED   Staphylococcus species NOT DETECTED NOT DETECTED   Staphylococcus aureus NOT DETECTED NOT DETECTED   Streptococcus species NOT DETECTED NOT DETECTED   Streptococcus agalactiae NOT DETECTED NOT DETECTED   Streptococcus pneumoniae NOT DETECTED NOT DETECTED   Streptococcus pyogenes NOT DETECTED NOT DETECTED   Acinetobacter baumannii NOT DETECTED NOT DETECTED   Enterobacteriaceae species NOT DETECTED NOT DETECTED   Enterobacter cloacae complex NOT DETECTED NOT DETECTED   Escherichia coli NOT DETECTED NOT DETECTED   Klebsiella oxytoca NOT DETECTED NOT DETECTED   Klebsiella pneumoniae NOT DETECTED NOT DETECTED   Proteus species NOT DETECTED NOT DETECTED   Serratia marcescens NOT DETECTED NOT DETECTED   Haemophilus influenzae NOT DETECTED NOT DETECTED   Neisseria meningitidis NOT DETECTED NOT DETECTED   Pseudomonas aeruginosa NOT DETECTED NOT DETECTED   Candida albicans NOT DETECTED NOT DETECTED   Candida glabrata NOT DETECTED NOT DETECTED   Candida krusei NOT DETECTED NOT DETECTED  Candida parapsilosis NOT DETECTED NOT DETECTED   Candida tropicalis NOT DETECTED NOT DETECTED  Basic metabolic panel  Result Value Ref Range   Sodium 137 135 - 145 mmol/L   Potassium 4.2 3.5 - 5.1 mmol/L   Chloride 102 101 - 111 mmol/L   CO2 26 22 - 32 mmol/L   Glucose, Bld 101 (H) 65 - 99 mg/dL   BUN 16 6 - 20 mg/dL   Creatinine, Ser 1.35 (H) 0.61 - 1.24 mg/dL   Calcium 9.4 8.9 - 10.3 mg/dL   GFR calc non Af Amer 49 (L) >60 mL/min   GFR calc Af Amer 56 (L) >60 mL/min   Anion gap 9 5  - 15  CBC  Result Value Ref Range   WBC 20.7 (H) 3.8 - 10.6 K/uL   RBC 5.18 4.40 - 5.90 MIL/uL   Hemoglobin 17.1 13.0 - 18.0 g/dL   HCT 51.6 40.0 - 52.0 %   MCV 99.6 80.0 - 100.0 fL   MCH 32.9 26.0 - 34.0 pg   MCHC 33.0 32.0 - 36.0 g/dL   RDW 14.7 (H) 11.5 - 14.5 %   Platelets 199 150 - 440 K/uL  Lactic acid, plasma  Result Value Ref Range   Lactic Acid, Venous 1.4 0.5 - 1.9 mmol/L  Procalcitonin  Result Value Ref Range   Procalcitonin 0.14 ng/mL  Influenza panel by PCR (type A & B)  Result Value Ref Range   Influenza A By PCR NEGATIVE NEGATIVE   Influenza B By PCR NEGATIVE NEGATIVE   Dg Chest Port 1 View  Result Date: 04/07/2017 CLINICAL DATA:  76 year old male with weakness since this morning. Nausea and vomiting this evening. Fever to 101.5 degrees. EXAM: PORTABLE CHEST 1 VIEW COMPARISON:  Chest x-ray 11/05/2016. FINDINGS: Airspace consolidation in the medial aspect of the right lower lobe concerning for pneumonia. Probable subsegmental atelectasis or scarring in the medial aspect of the left lower lobe. No pleural effusions. No evidence of pulmonary edema. Heart size is upper limits of normal. Upper mediastinal contours are within normal limits. Aortic atherosclerosis. IMPRESSION: 1. Right lower lobe pneumonia. Followup PA and lateral chest X-ray is recommended in 3-4 weeks following trial of antibiotic therapy to ensure resolution and exclude underlying malignancy. 2. Probable scarring or subsegmental atelectasis in the medial left lower lobe. 3. Aortic atherosclerosis. Electronically Signed   By: Vinnie Langton M.D.   On: 04/07/2017 20:11

## 2017-04-12 NOTE — Telephone Encounter (Signed)
+   BC which Dr Mable Paris is aware and will call Pt. oer Lucio Edward Pharm D

## 2017-04-12 NOTE — ED Provider Notes (Signed)
[facility] Emergency Department Provider Note  ____________________________________________   First MD Initiated Contact with Patient 04/12/17 1120     (approximate)  I have reviewed the triage vital signs and the nursing notes.   HISTORY  Chief Complaint Abnormal Lab   HPI [name] is a 76 y.o. male who comes to the emergency department as a callback for positive blood cultures.  I actually treated the patient myself 4 days ago for community-acquired pneumonia.  He initially appeared septic, however after fluids and antipyretics his symptoms improved.  He had a right lower lobe pneumonia and was discharged home with amoxicillin and azithromycin.  He says that ever since discharge she has had no fevers or chills.  His cough is resolved and he feels well.  He is completely asymptomatic at this point.  Today blood cultures came back with 2 bottles positive for burkholderia.  Past Medical History:  Diagnosis Date  . AB (asthmatic bronchitis)   . Allergic rhinitis   . Arthropathy   . Asthma   . Cellulitis   . DVT (deep venous thrombosis) ([place])   . Foreign body in skin   . Glaucoma   . Hx MRSA infection   . Hx MRSA infection   . Hypertension   . Insomnia   . Insomnia   . Mitral valve disorder   . Osteoarthrosis   . Upper back strain     Patient Active Problem List   Diagnosis Date Noted  . Sepsis ([place]) 11/05/2016  . Diverticulitis 11/05/2016  . History of deep vein thrombosis (DVT) of lower extremity 11/05/2016  . HCAP (healthcare-associated pneumonia) 08/29/2016  . NSTEMI (non-ST elevated myocardial infarction) ([place]) 08/18/2016  . Chronic obstructive pulmonary disease ([place]) 01/02/2016  . DVT, femoral, acute ([place]) 05/15/2015  . Recurrent pneumonia 12/21/2014  . Basal cell carcinoma 10/19/2014  . Abnormal liver enzymes 10/19/2014  . Personal history of methicillin resistant Staphylococcus aureus 10/19/2014  . Cannot sleep 10/19/2014  .  Heel pain 10/19/2014  . Pain in shoulder 10/19/2014  . Back strain 10/19/2014  . Pulmonary aspergillosis, allergic bronchopulmonary type ([place]) 07/30/2014  . ABPA (allergic bronchopulmonary aspergillosis) ([place]) 07/30/2014  . CAP (community acquired pneumonia) 05/03/2014  . Abnormal CXR 04/01/2014  . Paroxysmal tachycardia ([place]) 06/23/2013  . Asymptomatic PVCs 06/23/2013  . Beat, premature ventricular 06/23/2013  . Cough 10/19/2012  . Allergic rhinitis 01/17/2009  . Asthma, chronic obstructive, without status asthmaticus ([place]) 11/04/2008  . CAFL (chronic airflow limitation) ([place]) 11/04/2008  . Essential hypertension 02/09/2008  . Arthropathia 08/28/2007  . Disorder of mitral valve 07/08/2005  . Benign prostatic hypertrophy without urinary obstruction 05/16/1997  . LBP (low back pain) 05/16/1997  . Degenerative arthritis of hip 03/18/1995    Past Surgical History:  Procedure Laterality Date  . APPENDECTOMY    . BRONCHOSCOPY    . LEFT HEART CATH AND CORONARY ANGIOGRAPHY N/A 08/19/2016   Procedure: Left Heart Cath and Coronary Angiography;  Surgeon: Dionisio David, MD;  Location: [facility];  Service: Cardiovascular;  Laterality: N/A;  . NASAL SINUS SURGERY  09/27/2014    Prior to Admission medications   Medication Sig Start Date End Date Taking? Authorizing Provider  ADVAIR HFA 230-21 MCG/ACT inhaler Inhale 2 puffs into the lungs 2 (two) times daily.  11/17/15   [provider]  albuterol (PROVENTIL HFA;VENTOLIN HFA) 108 (90 Base) MCG/ACT inhaler Inhale into the lungs. 2 puffs twice daily 12/26/16 12/26/17  [provider]  amoxicillin (AMOXIL)  500 MG tablet Take 2 tablets (1,000 mg total) by mouth 2 (two) times daily. 04/07/17 04/14/17  Darel Hong, MD  aspirin EC 81 MG EC tablet Take 1 tablet (81 mg total) by mouth daily. 08/21/16   Loletha Grayer, MD  atorvastatin (LIPITOR) 40 MG tablet Take 1 tablet (40 mg total) by mouth daily at 6 PM. 09/10/16    Jerrol Banana., MD  azithromycin (ZITHROMAX Z-PAK) 250 MG tablet Take 2 tablets (500 mg) on  Day 1,  followed by 1 tablet (250 mg) once daily on Days 2 through 5. 04/07/17 04/12/17  Darel Hong, MD  brimonidine (ALPHAGAN) 0.15 % ophthalmic solution Place 1 drop into both eyes 2 (two) times daily.  10/26/15   [provider]  Calcium Carb-Cholecalciferol (CALCIUM 600 + D PO) Take 1 tablet by mouth 2 (two) times daily.    [provider]  diltiazem (CARDIZEM CD) 180 MG 24 hr capsule Take 1 capsule (180 mg total) by mouth daily. 09/10/16   Jerrol Banana., MD  dorzolamide (TRUSOPT) 2 % ophthalmic solution Place 1 drop into both eyes 2 (two) times daily.  08/31/15   [provider]  doxycycline (VIBRAMYCIN) 50 MG capsule Take 2 capsules (100 mg total) by mouth 2 (two) times daily. 04/12/17 04/19/17  Darel Hong, MD  gabapentin (NEURONTIN) 400 MG capsule Take 400 mg by mouth at bedtime.  09/18/15   [provider]  isosorbide mononitrate (IMDUR) 30 MG 24 hr tablet Take 1 tablet (30 mg total) by mouth daily. 09/10/16   Jerrol Banana., MD  latanoprost (XALATAN) 0.005 % ophthalmic solution Place 1 drop into both eyes at bedtime.     [provider]  Multiple Vitamin (MULTIVITAMIN) tablet Take 1 tablet by mouth daily.    [provider]  omeprazole (PRILOSEC) 20 MG capsule Take 1 capsule (20 mg total) by mouth 2 (two) times daily. 03/24/17   Jerrol Banana., MD  tiotropium (SPIRIVA HANDIHALER) 18 MCG inhalation capsule Place 18 mcg into inhaler and inhale daily.  02/06/15 08/18/17  [provider]  XARELTO 20 MG TABS tablet Take 10 mg by mouth.  08/05/16   [provider]    Allergies Timolol maleate [timolol maleate]; Bactrim [sulfamethoxazole-trimethoprim]; Budesonide-formoterol fumarate; and Lisinopril  Family History  Problem Relation Age of Onset  . Breast cancer Sister   . Heart failure Mother   .  Pancreatic cancer Sister   . Alzheimer's disease Father   . Brain cancer Daughter     Social History Social History  Substance Use Topics  . Smoking status: Former Smoker    Packs/day: 0.50    Years: 3.00    Types: Cigarettes    Quit date: 06/17/1958  . Smokeless tobacco: Never Used     Comment: smoking while in college  . Alcohol use 1.2 - 1.8 oz/week    2 - 3 Glasses of wine per week    Review of Systems Constitutional: No fever/chills Eyes: No visual changes. ENT: No sore throat. Cardiovascular: Denies chest pain. Respiratory: Denies shortness of breath. Gastrointestinal: No abdominal pain.  No nausea, no vomiting.  No diarrhea.  No constipation. Genitourinary: Negative for dysuria. Musculoskeletal: Negative for back pain. Skin: Negative for rash. Neurological: Negative for headaches, focal weakness or numbness.   ____________________________________________   PHYSICAL EXAM:  VITAL SIGNS: ED Triage Vitals  Enc Vitals Group     BP 04/12/17 1112 (!) 162/88     Pulse Rate 04/12/17  1112 77     Resp 04/12/17 1112 18     Temp 04/12/17 1112 97.8 F (36.6 C)     Temp Source 04/12/17 1112 Oral     SpO2 04/12/17 1112 96 %     Weight 04/12/17 1113 168 lb (76.2 kg)     Height 04/12/17 1113 6\' 2"  (1.88 m)     Head Circumference --      Peak Flow --      Pain Score --      Pain Loc --      Pain Edu? --      Excl. in [place]? --     Constitutional: Alert and oriented x4 well-appearing nontoxic no diaphoresis speaks full clear sentences Eyes: PERRL EOMI. Head: Atraumatic. Nose: No congestion/rhinnorhea. Mouth/Throat: No trismus Neck: No stridor.   Cardiovascular: Normal rate, regular rhythm. Grossly normal heart sounds.  Good peripheral circulation. Respiratory: Normal respiratory effort.  No retractions. Lungs CTAB and moving good air Gastrointestinal: Soft nontender Musculoskeletal: No lower extremity edema   Neurologic:  Normal speech and language. No gross focal  neurologic deficits are appreciated. Skin:  Skin is warm, dry and intact. No rash noted. Psychiatric: Mood and affect are normal. Speech and behavior are normal.    ____________________________________________   DIFFERENTIAL includes but not limited to  Bacteremia, sepsis, pneumonia ____________________________________________   LABS (all labs ordered are listed, but only abnormal results are displayed)  Labs Reviewed  CULTURE, BLOOD (ROUTINE X 2)  CULTURE, BLOOD (ROUTINE X 2)    Blood cultures are pending __________________________________________  EKG   ____________________________________________  RADIOLOGY   ____________________________________________   PROCEDURES  Procedure(s) performed: no  Procedures  Critical Care performed: no  Observation: no ____________________________________________   INITIAL IMPRESSION / ASSESSMENT AND PLAN / ED COURSE  Pertinent labs & imaging results that were available during my care of the patient were reviewed by me and considered in my medical decision making (see chart for details).    ----------------------------------------- 11:31 AM on 04/12/2017 -----------------------------------------  I discussed the case with on-call infectious disease doctor at [facility] Dr. Linus Salmons who noted that this is an unusual bacteria to have although could be secondary to chronic lung disease.  He does recommend Bactrim double strength twice a day for 1 week and follow-up with his primary care physician.  He also recommends repeat blood cultures today.  As the patient had what sounds like Stevens-Johnson syndrome to Bactrim Dr. Sandra Cockayne recommends doxycycline 100 mg twice a day for 1 week.  The patient is discharged home in improved condition with strict return precautions verbalizes understanding and agreement with the plan.  ____________________________________________   FINAL CLINICAL IMPRESSION(S) / ED DIAGNOSES  Final  diagnoses:  Bacteremia      NEW MEDICATIONS STARTED DURING THIS VISIT:  New Prescriptions   DOXYCYCLINE (VIBRAMYCIN) 50 MG CAPSULE    Take 2 capsules (100 mg total) by mouth 2 (two) times daily.     Note:  This document was prepared using Dragon voice recognition software and may include unintentional dictation errors.     Darel Hong, MD 04/12/17 1148

## 2017-04-12 NOTE — Progress Notes (Signed)
ED culture  Blood culture: Burkholderia  Spoke with MD Rifenbark. MD will get charge RN to call patient to come into facility to get [facility] inpatient.   Larene Beach, PharmD

## 2017-04-15 ENCOUNTER — Ambulatory Visit (INDEPENDENT_AMBULATORY_CARE_PROVIDER_SITE_OTHER): Payer: PPO | Admitting: Family Medicine

## 2017-04-15 VITALS — BP 122/64 | HR 68 | Temp 97.8°F | Resp 16 | Wt 169.0 lb

## 2017-04-15 DIAGNOSIS — J189 Pneumonia, unspecified organism: Secondary | ICD-10-CM | POA: Diagnosis not present

## 2017-04-15 NOTE — Progress Notes (Signed)
Clinton TRAWEEK  MRN: [id number] DOB: Dec 17, 1940  Subjective:  HPI  The patient is a 76 year old male who presents for follow up of ED labs.    The patient was seen on 04/07/17 at [facility] emergency department with the diagnosis of CAP.  He had blood cultures done at that time.  He was called by the hospital to come back in on 04/02/17 because the culture results revealed positive for Burkholderia Species.  The sensitivity is still pending.  He was treated with antibiotics on 10/27, and another blood culture was obtained per the notes.  Patient reports that he is felling fine.  He has been doing well from the pneumonia for about a week or more.  He feels well enough that he plans to go flying this afternoon.  Patient Active Problem List   Diagnosis Date Noted  . Sepsis ([place]) 11/05/2016  . Diverticulitis 11/05/2016  . History of deep vein thrombosis (DVT) of lower extremity 11/05/2016  . HCAP (healthcare-associated pneumonia) 08/29/2016  . NSTEMI (non-ST elevated myocardial infarction) ([place]) 08/18/2016  . Chronic obstructive pulmonary disease ([place]) 01/02/2016  . DVT, femoral, acute ([place]) 05/15/2015  . Recurrent pneumonia 12/21/2014  . Basal cell carcinoma 10/19/2014  . Abnormal liver enzymes 10/19/2014  . Personal history of methicillin resistant Staphylococcus aureus 10/19/2014  . Cannot sleep 10/19/2014  . Heel pain 10/19/2014  . Pain in shoulder 10/19/2014  . Back strain 10/19/2014  . Pulmonary aspergillosis, allergic bronchopulmonary type ([place]) 07/30/2014  . ABPA (allergic bronchopulmonary aspergillosis) ([place]) 07/30/2014  . CAP (community acquired pneumonia) 05/03/2014  . Abnormal CXR 04/01/2014  . Paroxysmal tachycardia ([place]) 06/23/2013  . Asymptomatic PVCs 06/23/2013  . Beat, premature ventricular 06/23/2013  . Cough 10/19/2012  . Allergic rhinitis 01/17/2009  . Asthma, chronic obstructive, without status asthmaticus ([place]) 11/04/2008  . CAFL (chronic airflow limitation) ([place])  11/04/2008  . Essential hypertension 02/09/2008  . Arthropathia 08/28/2007  . Disorder of mitral valve 07/08/2005  . Benign prostatic hypertrophy without urinary obstruction 05/16/1997  . LBP (low back pain) 05/16/1997  . Degenerative arthritis of hip 03/18/1995    Past Medical History:  Diagnosis Date  . AB (asthmatic bronchitis)   . Allergic rhinitis   . Arthropathy   . Asthma   . Cellulitis   . DVT (deep venous thrombosis) ([place])   . Foreign body in skin   . Glaucoma   . Hx MRSA infection   . Hx MRSA infection   . Hypertension   . Insomnia   . Insomnia   . Mitral valve disorder   . Osteoarthrosis   . Upper back strain     Social History   Social History  . Marital status: Married    Spouse name: Everlene Farrier  . Number of children: 2  . Years of education: College   Occupational History  . Retired Retired    Chief Financial Officer   Social History Main Topics  . Smoking status: Former Smoker    Packs/day: 0.50    Years: 3.00    Types: Cigarettes    Quit date: 06/17/1958  . Smokeless tobacco: Never Used     Comment: smoking while in college  . Alcohol use 1.2 - 1.8 oz/week    2 - 3 Glasses of wine per week  . Drug use: No  . Sexual activity: Not on file   Other Topics Concern  . Not on file   Social History Narrative   He is a married father of 2 daughters.  He smoked casually while in college for about 3 years less than a pack a day.   He drinks 3-4 glass of wine at night. He also has about 2 servings of coffee a day   He is a very active gentleman with [employer] exercise program at least 3-4 days a week.   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   In addition to this standing his exercise, he does lots of gardening and uses a chain saw to cut wood.             Outpatient Encounter Prescriptions as of 04/15/2017  Medication Sig  . ADVAIR HFA 230-21 MCG/ACT inhaler Inhale 2 puffs into the lungs 2 (two) times daily.   Marland Kitchen albuterol  (PROVENTIL HFA;VENTOLIN HFA) 108 (90 Base) MCG/ACT inhaler Inhale into the lungs. 2 puffs twice daily  . aspirin EC 81 MG EC tablet Take 1 tablet (81 mg total) by mouth daily.  Marland Kitchen atorvastatin (LIPITOR) 40 MG tablet Take 1 tablet (40 mg total) by mouth daily at 6 PM.  . brimonidine (ALPHAGAN) 0.15 % ophthalmic solution Place 1 drop into both eyes 2 (two) times daily.   . Calcium Carb-Cholecalciferol (CALCIUM 600 + D PO) Take 1 tablet by mouth 2 (two) times daily.  Marland Kitchen diltiazem (CARDIZEM CD) 180 MG 24 hr capsule Take 1 capsule (180 mg total) by mouth daily.  . dorzolamide (TRUSOPT) 2 % ophthalmic solution Place 1 drop into both eyes 2 (two) times daily.   Marland Kitchen doxycycline (VIBRAMYCIN) 50 MG capsule Take 2 capsules (100 mg total) by mouth 2 (two) times daily.  Marland Kitchen gabapentin (NEURONTIN) 400 MG capsule Take 400 mg by mouth at bedtime.   . isosorbide mononitrate (IMDUR) 30 MG 24 hr tablet Take 1 tablet (30 mg total) by mouth daily.  Marland Kitchen latanoprost (XALATAN) 0.005 % ophthalmic solution Place 1 drop into both eyes at bedtime.   . Multiple Vitamin (MULTIVITAMIN) tablet Take 1 tablet by mouth daily.  Marland Kitchen omeprazole (PRILOSEC) 20 MG capsule Take 1 capsule (20 mg total) by mouth 2 (two) times daily.  Marland Kitchen tiotropium (SPIRIVA HANDIHALER) 18 MCG inhalation capsule Place 18 mcg into inhaler and inhale daily.   Alveda Reasons 20 MG TABS tablet Take 10 mg by mouth.    No facility-administered encounter medications on file as of 04/15/2017.     Allergies  Allergen Reactions  . Timolol Maleate [Timolol Maleate] Shortness Of Breath  . Bactrim [Sulfamethoxazole-Trimethoprim]     Pt says "it takes his skin pigment off  . Budesonide-Formoterol Fumarate Other (See Comments)    SYMBICORT Patient reports dizziness and trembling/shaky feeling.  . Lisinopril     cough    Review of Systems  Constitutional: Negative for fever and malaise/fatigue.  Respiratory: Negative for cough, shortness of breath and wheezing.     Cardiovascular: Negative for chest pain, palpitations, orthopnea, claudication and leg swelling.  Gastrointestinal: Negative.   Neurological: Negative for weakness.  Endo/Heme/Allergies: Negative.   Psychiatric/Behavioral: Negative.     Objective:  BP 122/64 (BP Location: Right Arm, Patient Position: Sitting, Cuff Size: Normal)   Pulse 68   Temp 97.8 F (36.6 C) (Oral)   Resp 16   Wt 169 lb (76.7 kg)   BMI 21.70 kg/m   Physical Exam  Constitutional: He is oriented to person, place, and time and well-developed, well-nourished, and in no distress.  HENT:  Head: Normocephalic and atraumatic.  Right Ear: External ear normal.  Left Ear: External  ear normal.  Nose: Nose normal.  Eyes: Pupils are equal, round, and reactive to light. Conjunctivae are normal. No scleral icterus.  Neck: Normal range of motion. No thyromegaly present.  Cardiovascular: Normal rate, regular rhythm and normal heart sounds.   Pulmonary/Chest: Effort normal and breath sounds normal.  Abdominal: Soft.  Neurological: He is alert and oriented to person, place, and time. Gait normal. GCS score is 15.  Skin: Skin is warm and dry.  Psychiatric: Mood, memory, affect and judgment normal.    Assessment and Plan :  Pneumonia with positive blood culture Clinically improving. CXR repeat 1 month.On Doxy.  I have done the exam and reviewed the chart and it is accurate to the best of my knowledge. Development worker, community has been used and  any errors in dictation or transcription are unintentional. Miguel Aschoff M.D. [facility]

## 2017-04-17 LAB — CULTURE, BLOOD (ROUTINE X 2)
CULTURE: NO GROWTH
Culture: NO GROWTH
SPECIAL REQUESTS: ADEQUATE
SPECIAL REQUESTS: ADEQUATE
SPECIAL REQUESTS: ADEQUATE
Special Requests: ADEQUATE

## 2017-04-17 LAB — SUSCEPTIBILITY, AER + ANAEROB

## 2017-04-17 LAB — SUSCEPTIBILITY RESULT

## 2017-04-24 ENCOUNTER — Ambulatory Visit: Payer: Self-pay | Admitting: Family Medicine

## 2017-04-24 ENCOUNTER — Other Ambulatory Visit: Payer: Self-pay | Admitting: Emergency Medicine

## 2017-04-24 MED ORDER — LEVOFLOXACIN 500 MG PO TABS: 500.0000 mg | ORAL_TABLET | Freq: Every day | ORAL | 0 refills | Status: DC

## 2017-05-22 ENCOUNTER — Ambulatory Visit
Admission: RE | Admit: 2017-05-22 | Discharge: 2017-05-22 | Disposition: A | Discharge status: Home / Self Care | Payer: PPO | Source: Ambulatory Visit | Attending: Family Medicine | Admitting: Family Medicine

## 2017-05-22 ENCOUNTER — Ambulatory Visit: Payer: PPO | Admitting: Family Medicine

## 2017-05-22 ENCOUNTER — Encounter: Payer: Self-pay | Admitting: Family Medicine

## 2017-05-22 VITALS — BP 140/72 | HR 62 | Temp 97.5°F | Resp 16 | Wt 174.0 lb

## 2017-05-22 DIAGNOSIS — J189 Pneumonia, unspecified organism: Secondary | ICD-10-CM

## 2017-05-22 NOTE — Progress Notes (Signed)
Patient: [name] Male    DOB: 1940/08/02   76 y.o.   MRN: [id number] Visit Date: 05/22/2017  Today's Provider: Wilhemena Durie, MD   Chief Complaint  Patient presents with  . Follow-up   Subjective:    HPI Pt is here for follow up of pneumonia. He reports that he is feeling great. No shortness of breath, chest pain or pneumonia related symptoms.        Allergies  Allergen Reactions  . Timolol Maleate [Timolol Maleate] Shortness Of Breath  . Bactrim [Sulfamethoxazole-Trimethoprim]     Pt says "it takes his skin pigment off  . Budesonide-Formoterol Fumarate Other (See Comments)    SYMBICORT Patient reports dizziness and trembling/shaky feeling.  . Lisinopril     cough     Current Outpatient Medications:  .  ADVAIR HFA 283-15 MCG/ACT inhaler, Inhale 2 puffs into the lungs 2 (two) times daily. , Disp: , Rfl:  .  albuterol (PROVENTIL HFA;VENTOLIN HFA) 108 (90 Base) MCG/ACT inhaler, Inhale into the lungs. 2 puffs twice daily, Disp: , Rfl:  .  aspirin EC 81 MG EC tablet, Take 1 tablet (81 mg total) by mouth daily., Disp: 30 tablet, Rfl: 0 .  atorvastatin (LIPITOR) 40 MG tablet, Take 1 tablet (40 mg total) by mouth daily at 6 PM., Disp: 90 tablet, Rfl: 3 .  brimonidine (ALPHAGAN) 0.15 % ophthalmic solution, Place 1 drop into both eyes 2 (two) times daily. , Disp: , Rfl:  .  Calcium Carb-Cholecalciferol (CALCIUM 600 + D PO), Take 1 tablet by mouth 2 (two) times daily., Disp: , Rfl:  .  diltiazem (CARDIZEM CD) 180 MG 24 hr capsule, Take 1 capsule (180 mg total) by mouth daily., Disp: 90 capsule, Rfl: 3 .  dorzolamide (TRUSOPT) 2 % ophthalmic solution, Place 1 drop into both eyes 2 (two) times daily. , Disp: , Rfl:  .  gabapentin (NEURONTIN) 400 MG capsule, Take 400 mg by mouth at bedtime. , Disp: , Rfl:  .  isosorbide mononitrate (IMDUR) 30 MG 24 hr tablet, Take 1 tablet (30 mg total) by mouth daily., Disp: 90 tablet, Rfl: 3 .  latanoprost (XALATAN) 0.005 % ophthalmic  solution, Place 1 drop into both eyes at bedtime. , Disp: , Rfl:  .  levofloxacin (LEVAQUIN) 500 MG tablet, Take 1 tablet (500 mg total) daily by mouth., Disp: 5 tablet, Rfl: 0 .  Multiple Vitamin (MULTIVITAMIN) tablet, Take 1 tablet by mouth daily., Disp: , Rfl:  .  omeprazole (PRILOSEC) 20 MG capsule, Take 1 capsule (20 mg total) by mouth 2 (two) times daily., Disp: 30 capsule, Rfl: 3 .  tiotropium (SPIRIVA HANDIHALER) 18 MCG inhalation capsule, Place 18 mcg into inhaler and inhale daily. , Disp: , Rfl:  .  XARELTO 20 MG TABS tablet, Take 10 mg by mouth. , Disp: , Rfl:   Review of Systems  Constitutional: Negative.   HENT: Negative.   Eyes: Negative.   Respiratory: Negative.   Cardiovascular: Negative.   Gastrointestinal: Negative.   Endocrine: Negative.   Genitourinary: Negative.   Musculoskeletal: Negative.   Skin: Negative.   Allergic/Immunologic: Negative.   Neurological: Negative.   Hematological: Negative.   Psychiatric/Behavioral: Negative.     Social History   Tobacco Use  . Smoking status: Former Smoker    Packs/day: 0.50    Years: 3.00    Pack years: 1.50    Types: Cigarettes    Last attempt to quit: 06/17/1958  Years since quitting: 58.9  . Smokeless tobacco: Never Used  . Tobacco comment: smoking while in college  Substance Use Topics  . Alcohol use: Yes    Alcohol/week: 1.2 - 1.8 oz    Types: 2 - 3 Glasses of wine per week   Objective:   BP 140/72 (BP Location: Left Arm, Patient Position: Sitting, Cuff Size: Normal)   Pulse 62   Temp (!) 97.5 F (36.4 C) (Oral)   Resp 16   Wt 174 lb (78.9 kg)   SpO2 96%   BMI 22.34 kg/m  Vitals:   05/22/17 1148  BP: 140/72  Pulse: 62  Resp: 16  Temp: (!) 97.5 F (36.4 C)  TempSrc: Oral  SpO2: 96%  Weight: 174 lb (78.9 kg)     Physical Exam  Constitutional: He is oriented to person, place, and time. He appears well-developed and well-nourished.  HENT:  Head: Normocephalic and atraumatic.  Eyes:  Conjunctivae and EOM are normal. Pupils are equal, round, and reactive to light.  Neck: Normal range of motion. Neck supple.  Cardiovascular: Normal rate, regular rhythm, normal heart sounds and intact distal pulses.  Pulmonary/Chest: Effort normal and breath sounds normal.  Abdominal: Soft.  Musculoskeletal: Normal range of motion.  Neurological: He is alert and oriented to person, place, and time. He has normal reflexes.  Skin: Skin is warm and dry.  Psychiatric: He has a normal mood and affect. His behavior is normal. Judgment and thought content normal.        Assessment & Plan:     1. Recurrent pneumonia Improved. Recheck cxr. Follow up in 6 months for a CPE - DG Chest 2 View; Future       HPI, Exam, and A&P Transcribed under the direction and in the presence of Kahliya Fraleigh L. Cranford Mon, MD  Electronically Signed: Katina Dung, CMA  I have done the exam and reviewed the above chart and it is accurate to the best of my knowledge. Development worker, community has been used in this note in any air is in the dictation or transcription are unintentional.  Wilhemena Durie, MD  [place]

## 2017-05-23 ENCOUNTER — Telehealth: Payer: Self-pay

## 2017-05-23 DIAGNOSIS — R9389 Abnormal findings on diagnostic imaging of other specified body structures: Secondary | ICD-10-CM

## 2017-05-23 NOTE — Telephone Encounter (Signed)
Patient advised. CT ordered

## 2017-05-23 NOTE — Telephone Encounter (Signed)
-----   Message from Jerrol Banana., MD sent at 05/23/2017  8:52 AM EST ----- Shadow still present on CXR--would get chest CT for f/u of this.

## 2017-06-02 ENCOUNTER — Other Ambulatory Visit: Payer: Self-pay | Admitting: Family Medicine

## 2017-06-02 DIAGNOSIS — R1310 Dysphagia, unspecified: Secondary | ICD-10-CM

## 2017-06-02 DIAGNOSIS — K2100 Gastro-esophageal reflux disease with esophagitis, without bleeding: Secondary | ICD-10-CM

## 2017-06-03 ENCOUNTER — Ambulatory Visit
Admission: RE | Admit: 2017-06-03 | Discharge: 2017-06-03 | Disposition: A | Discharge status: Home / Self Care | Payer: PPO | Source: Ambulatory Visit | Attending: Family Medicine | Admitting: Family Medicine

## 2017-06-03 DIAGNOSIS — R0602 Shortness of breath: Secondary | ICD-10-CM | POA: Diagnosis not present

## 2017-06-03 DIAGNOSIS — I700 Atherosclerosis of aorta: Secondary | ICD-10-CM | POA: Insufficient documentation

## 2017-06-03 DIAGNOSIS — J439 Emphysema, unspecified: Secondary | ICD-10-CM | POA: Insufficient documentation

## 2017-06-03 DIAGNOSIS — I2510 Atherosclerotic heart disease of native coronary artery without angina pectoris: Secondary | ICD-10-CM | POA: Diagnosis not present

## 2017-06-03 DIAGNOSIS — R9389 Abnormal findings on diagnostic imaging of other specified body structures: Secondary | ICD-10-CM | POA: Diagnosis present

## 2017-06-03 DIAGNOSIS — R0989 Other specified symptoms and signs involving the circulatory and respiratory systems: Secondary | ICD-10-CM | POA: Diagnosis not present

## 2017-06-04 ENCOUNTER — Ambulatory Visit: Payer: PPO | Admitting: Podiatry

## 2017-06-04 ENCOUNTER — Encounter: Payer: Self-pay | Admitting: Podiatry

## 2017-06-04 DIAGNOSIS — M19079 Primary osteoarthritis, unspecified ankle and foot: Secondary | ICD-10-CM | POA: Diagnosis not present

## 2017-06-04 DIAGNOSIS — M778 Other enthesopathies, not elsewhere classified: Secondary | ICD-10-CM

## 2017-06-04 DIAGNOSIS — M7751 Other enthesopathy of right foot: Secondary | ICD-10-CM

## 2017-06-04 DIAGNOSIS — M779 Enthesopathy, unspecified: Principal | ICD-10-CM

## 2017-06-04 NOTE — Progress Notes (Signed)
[name] presents today with a chief complaint of pain to the first intermetatarsal space and the fourth intermetatarsal space proximally.  He presents with X marks on his foot in the Inc. where his foot hurts.  He states that the injections helped a lot last time and lasted several months.  Objective: Vital signs are stable he is alert and oriented x3.  Pulses are palpable.  He has no foot pain on frontal plane range of motion he has only pain on palpation of the proximal intermetatarsal spaces #4 #1 of the right foot.  Previous MRI suggested osteoarthritis.  Assessment: Osteoarthritis capsulitis Lisfranc's joints right foot.  Plan: I injected the proximal aspect of the first intermetatarsal space and of the fourth intermetatarsal space right foot with 20 mg of Kenalog 5 mg of Marcaine to the point of maximal tenderness after sterile Betadine skin prep and verbal confirmation.  He understood it was amenable to a tolerated procedure well and I will follow-up with him in 6-7 months.

## 2017-06-05 ENCOUNTER — Telehealth: Payer: Self-pay | Admitting: Family Medicine

## 2017-06-05 ENCOUNTER — Other Ambulatory Visit: Payer: Self-pay | Admitting: Family Medicine

## 2017-06-05 DIAGNOSIS — T17908A Unspecified foreign body in respiratory tract, part unspecified causing other injury, initial encounter: Secondary | ICD-10-CM

## 2017-06-05 NOTE — Telephone Encounter (Signed)
Pt is returning call.  QA#[phone]/[id number]

## 2017-06-05 NOTE — Telephone Encounter (Signed)
Pt advised of CT results. See imaging notes.

## 2017-06-12 ENCOUNTER — Other Ambulatory Visit: Payer: Self-pay | Admitting: Family Medicine

## 2017-06-12 ENCOUNTER — Other Ambulatory Visit: Payer: Self-pay

## 2017-06-12 DIAGNOSIS — T17908A Unspecified foreign body in respiratory tract, part unspecified causing other injury, initial encounter: Secondary | ICD-10-CM

## 2017-07-02 ENCOUNTER — Ambulatory Visit: Payer: PPO

## 2017-07-02 DIAGNOSIS — J45909 Unspecified asthma, uncomplicated: Secondary | ICD-10-CM | POA: Diagnosis not present

## 2017-07-02 LAB — PULMONARY FUNCTION TEST

## 2017-07-10 ENCOUNTER — Ambulatory Visit: Payer: PPO

## 2017-07-14 ENCOUNTER — Ambulatory Visit
Admission: RE | Admit: 2017-07-14 | Discharge: 2017-07-14 | Disposition: A | Discharge status: Home / Self Care | Payer: PPO | Source: Ambulatory Visit | Attending: Family Medicine | Admitting: Family Medicine

## 2017-07-14 DIAGNOSIS — R1312 Dysphagia, oropharyngeal phase: Secondary | ICD-10-CM | POA: Diagnosis not present

## 2017-07-14 DIAGNOSIS — T17320A Food in larynx causing asphyxiation, initial encounter: Secondary | ICD-10-CM | POA: Diagnosis not present

## 2017-07-14 NOTE — Therapy (Signed)
[place], [place], [address] Phone: [phone]   Fax:     Modified Barium Swallow  Patient Details  Name: [name] MRN: [id number] Date of Birth: [birth year] No Data Recorded  Encounter Date: 07/14/2017  End of Session - 07/14/17 1332    Visit Number  1    Number of Visits  1    Date for SLP Re-Evaluation  07/14/17    SLP Start Time  1300    SLP Stop Time   1332    SLP Time Calculation (min)  32 min    Activity Tolerance  Patient tolerated treatment well       Past Medical History:  Diagnosis Date  . AB (asthmatic bronchitis)   . Allergic rhinitis   . Arthropathy   . Asthma   . Cellulitis   . DVT (deep venous thrombosis) ([place])   . Foreign body in skin   . Glaucoma   . Hx MRSA infection   . Hx MRSA infection   . Hypertension   . Insomnia   . Insomnia   . Mitral valve disorder   . Osteoarthrosis   . Upper back strain     Past Surgical History:  Procedure Laterality Date  . APPENDECTOMY    . BRONCHOSCOPY    . LEFT HEART CATH AND CORONARY ANGIOGRAPHY N/A 08/19/2016   Procedure: Left Heart Cath and Coronary Angiography;  Surgeon: Dionisio David, MD;  Location: [facility];  Service: Cardiovascular;  Laterality: N/A;  . NASAL SINUS SURGERY  09/27/2014    There were no vitals filed for this visit.   Subjective: Patient behavior: (alertness, ability to follow instructions, etc.): Patient able to verbalize his swallowing history and follow directions  Chief complaint: recurrent pneumonia   Objective:  Radiological Procedure: A videoflouroscopic evaluation of oral-preparatory, reflex initiation, and pharyngeal phases of the swallow was performed; as well as a screening of the upper esophageal phase.  I. POSTURE: Upright in MBS chair  II. VIEW: Lateral  III. COMPENSATORY STRATEGIES: N/A  IV. BOLUSES ADMINISTERED:   Thin Liquid: 2 small, 3 rapid  consecutive   Nectar-thick Liquid: 1 moderate   Honey-thick Liquid: DNT   Puree: 2 teaspoon presentations   Mechanical Soft: 1/4 graham cracker in applesauce  V. RESULTS OF EVALUATION: A. ORAL PREPARATORY PHASE: (The lips, tongue, and velum are observed for strength and coordination)       **Overall Severity Rating: Within normal limits  B. SWALLOW INITIATION/REFLEX: (The reflex is normal if "triggered" by the time the bolus reached the base of the tongue)  **Overall Severity Rating: Within normal limits  C. PHARYNGEAL PHASE: (Pharyngeal function is normal if the bolus shows rapid, smooth, and continuous transit through the pharynx and there is no pharyngeal residue after the swallow)  **Overall Severity Rating: Within normal limits  D. LARYNGEAL PENETRATION: (Material entering into the laryngeal inlet/vestibule but not aspirated) There is transient trace epiglottic undercoating during rapid consecutive swallows  E. ASPIRATION: None  F. ESOPHAGEAL PHASE: (Screening of the upper esophagus) No observed abnormality within the viewable cervical esophagus  ASSESSMENT: This 77 year old man; with concern for aspiration secondary recurrent pneumonia; is presenting with functional oropharyngeal swallowing.  Oral control of the bolus including oral hold, rotary mastication, and anterior to posterior transfer is within normal limits. Timing of the pharyngeal swallow is within normal limits.  Aspects of the pharyngeal stage of swallowing including tongue base retraction, hyolaryngeal  excursion, epiglottic inversion, and duration/amplitude of UES opening are within normal limits.  There is no observed pharyngeal residue or tracheal aspiration.  There is transient trace epiglottic undercoating during rapid consecutive swallows that clears with completion of the swallow. The patient  is not at significant risk for prandial aspiration and recurrent pneumonia does not appear to be due to prandial  aspiration.  PLAN/RECOMMENDATIONS:   A. Diet: regular   B. Swallowing Precautions: no special precautions   C. Recommended consultation to: follow up with MDs as recommended   D. Therapy recommendations: speech therapy is not indicated   E. Results and recommendations were discussed with the patient immediately following the study and the final report routed to the referring MD.   Dysphagia, oropharyngeal - Plan: DG OP Swallowing Func-Medicare/Speech Path, DG OP Swallowing Func-Medicare/Speech Path        Problem List Patient Active Problem List   Diagnosis Date Noted  . Sepsis ([place]) 11/05/2016  . Diverticulitis 11/05/2016  . History of deep vein thrombosis (DVT) of lower extremity 11/05/2016  . HCAP (healthcare-associated pneumonia) 08/29/2016  . NSTEMI (non-ST elevated myocardial infarction) ([place]) 08/18/2016  . Chronic obstructive pulmonary disease ([place]) 01/02/2016  . DVT, femoral, acute ([place]) 05/15/2015  . Recurrent pneumonia 12/21/2014  . Basal cell carcinoma 10/19/2014  . Abnormal liver enzymes 10/19/2014  . Personal history of methicillin resistant Staphylococcus aureus 10/19/2014  . Cannot sleep 10/19/2014  . Heel pain 10/19/2014  . Pain in shoulder 10/19/2014  . Back strain 10/19/2014  . Pulmonary aspergillosis, allergic bronchopulmonary type ([place]) 07/30/2014  . ABPA (allergic bronchopulmonary aspergillosis) ([place]) 07/30/2014  . CAP (community acquired pneumonia) 05/03/2014  . Abnormal CXR 04/01/2014  . Paroxysmal tachycardia ([place]) 06/23/2013  . Asymptomatic PVCs 06/23/2013  . Beat, premature ventricular 06/23/2013  . Cough 10/19/2012  . Allergic rhinitis 01/17/2009  . Asthma, chronic obstructive, without status asthmaticus ([place]) 11/04/2008  . CAFL (chronic airflow limitation) ([place]) 11/04/2008  . Essential hypertension 02/09/2008  . Arthropathia 08/28/2007  . Disorder of mitral valve 07/08/2005  . Benign prostatic hypertrophy without urinary obstruction  05/16/1997  . LBP (low back pain) 05/16/1997  . Degenerative arthritis of hip 03/18/1995   Leroy Sea, MS/CCC- SLP  Lou Miner 07/14/2017, 1:34 PM  [facility] [place], [place], [address] Phone: [phone]   Fax:     Name: [name] MRN: [id number] Date of Birth: [birth year]

## 2017-07-15 ENCOUNTER — Ambulatory Visit: Payer: Self-pay | Admitting: Family Medicine

## 2017-07-28 DIAGNOSIS — H401132 Primary open-angle glaucoma, bilateral, moderate stage: Secondary | ICD-10-CM | POA: Diagnosis not present

## 2017-07-30 DIAGNOSIS — L820 Inflamed seborrheic keratosis: Secondary | ICD-10-CM | POA: Diagnosis not present

## 2017-07-30 DIAGNOSIS — D229 Melanocytic nevi, unspecified: Secondary | ICD-10-CM | POA: Diagnosis not present

## 2017-07-30 DIAGNOSIS — L570 Actinic keratosis: Secondary | ICD-10-CM | POA: Diagnosis not present

## 2017-07-30 DIAGNOSIS — L80 Vitiligo: Secondary | ICD-10-CM | POA: Diagnosis not present

## 2017-07-30 DIAGNOSIS — D485 Neoplasm of uncertain behavior of skin: Secondary | ICD-10-CM | POA: Diagnosis not present

## 2017-07-30 DIAGNOSIS — L918 Other hypertrophic disorders of the skin: Secondary | ICD-10-CM | POA: Diagnosis not present

## 2017-07-30 DIAGNOSIS — L578 Other skin changes due to chronic exposure to nonionizing radiation: Secondary | ICD-10-CM | POA: Diagnosis not present

## 2017-07-30 DIAGNOSIS — Z1283 Encounter for screening for malignant neoplasm of skin: Secondary | ICD-10-CM | POA: Diagnosis not present

## 2017-07-30 DIAGNOSIS — D1800 Hemangioma unspecified site: Secondary | ICD-10-CM | POA: Diagnosis not present

## 2017-07-30 DIAGNOSIS — D692 Other nonthrombocytopenic purpura: Secondary | ICD-10-CM | POA: Diagnosis not present

## 2017-08-05 DIAGNOSIS — Z7982 Long term (current) use of aspirin: Secondary | ICD-10-CM | POA: Diagnosis not present

## 2017-08-05 DIAGNOSIS — I8290 Acute embolism and thrombosis of unspecified vein: Secondary | ICD-10-CM | POA: Diagnosis not present

## 2017-08-05 DIAGNOSIS — Z7901 Long term (current) use of anticoagulants: Secondary | ICD-10-CM | POA: Diagnosis not present

## 2017-08-13 ENCOUNTER — Ambulatory Visit: Payer: PPO | Admitting: Physician Assistant

## 2017-08-13 ENCOUNTER — Encounter: Payer: Self-pay | Admitting: Emergency Medicine

## 2017-08-13 ENCOUNTER — Inpatient Hospital Stay
Admission: EM | Admit: 2017-08-13 | Discharge: 2017-08-14 | DRG: 194 | Disposition: A | Discharge status: Home / Self Care | Payer: PPO | Attending: Internal Medicine | Admitting: Internal Medicine

## 2017-08-13 ENCOUNTER — Emergency Department: Payer: PPO

## 2017-08-13 ENCOUNTER — Ambulatory Visit (INDEPENDENT_AMBULATORY_CARE_PROVIDER_SITE_OTHER): Payer: PPO | Admitting: Physician Assistant

## 2017-08-13 ENCOUNTER — Other Ambulatory Visit: Payer: Self-pay

## 2017-08-13 DIAGNOSIS — J1000 Influenza due to other identified influenza virus with unspecified type of pneumonia: Principal | ICD-10-CM | POA: Diagnosis present

## 2017-08-13 DIAGNOSIS — R0902 Hypoxemia: Secondary | ICD-10-CM

## 2017-08-13 DIAGNOSIS — E785 Hyperlipidemia, unspecified: Secondary | ICD-10-CM | POA: Diagnosis present

## 2017-08-13 DIAGNOSIS — R000 Tachycardia, unspecified: Secondary | ICD-10-CM

## 2017-08-13 DIAGNOSIS — Z87891 Personal history of nicotine dependence: Secondary | ICD-10-CM

## 2017-08-13 DIAGNOSIS — R531 Weakness: Secondary | ICD-10-CM | POA: Diagnosis not present

## 2017-08-13 DIAGNOSIS — Z7982 Long term (current) use of aspirin: Secondary | ICD-10-CM | POA: Diagnosis not present

## 2017-08-13 DIAGNOSIS — J101 Influenza due to other identified influenza virus with other respiratory manifestations: Secondary | ICD-10-CM

## 2017-08-13 DIAGNOSIS — Z888 Allergy status to other drugs, medicaments and biological substances status: Secondary | ICD-10-CM

## 2017-08-13 DIAGNOSIS — I248 Other forms of acute ischemic heart disease: Secondary | ICD-10-CM | POA: Diagnosis present

## 2017-08-13 DIAGNOSIS — N179 Acute kidney failure, unspecified: Secondary | ICD-10-CM | POA: Diagnosis present

## 2017-08-13 DIAGNOSIS — H409 Unspecified glaucoma: Secondary | ICD-10-CM | POA: Diagnosis not present

## 2017-08-13 DIAGNOSIS — N183 Chronic kidney disease, stage 3 (moderate): Secondary | ICD-10-CM | POA: Diagnosis not present

## 2017-08-13 DIAGNOSIS — I129 Hypertensive chronic kidney disease with stage 1 through stage 4 chronic kidney disease, or unspecified chronic kidney disease: Secondary | ICD-10-CM | POA: Diagnosis not present

## 2017-08-13 DIAGNOSIS — I10 Essential (primary) hypertension: Secondary | ICD-10-CM | POA: Diagnosis not present

## 2017-08-13 DIAGNOSIS — J189 Pneumonia, unspecified organism: Secondary | ICD-10-CM | POA: Diagnosis not present

## 2017-08-13 DIAGNOSIS — Z8614 Personal history of Methicillin resistant Staphylococcus aureus infection: Secondary | ICD-10-CM

## 2017-08-13 DIAGNOSIS — I059 Rheumatic mitral valve disease, unspecified: Secondary | ICD-10-CM | POA: Diagnosis not present

## 2017-08-13 DIAGNOSIS — Z7901 Long term (current) use of anticoagulants: Secondary | ICD-10-CM

## 2017-08-13 DIAGNOSIS — Z79899 Other long term (current) drug therapy: Secondary | ICD-10-CM

## 2017-08-13 DIAGNOSIS — Z86718 Personal history of other venous thrombosis and embolism: Secondary | ICD-10-CM

## 2017-08-13 DIAGNOSIS — R05 Cough: Secondary | ICD-10-CM | POA: Diagnosis not present

## 2017-08-13 DIAGNOSIS — I959 Hypotension, unspecified: Secondary | ICD-10-CM | POA: Diagnosis not present

## 2017-08-13 DIAGNOSIS — R0602 Shortness of breath: Secondary | ICD-10-CM | POA: Diagnosis present

## 2017-08-13 DIAGNOSIS — J45909 Unspecified asthma, uncomplicated: Secondary | ICD-10-CM | POA: Diagnosis not present

## 2017-08-13 LAB — CBC WITH DIFFERENTIAL/PLATELET
BASOS PCT: 0 %
Basophils Absolute: 0 10*3/uL (ref 0–0.1)
EOS ABS: 0.1 10*3/uL (ref 0–0.7)
Eosinophils Relative: 1 %
HEMATOCRIT: 52.9 % — AB (ref 40.0–52.0)
HEMOGLOBIN: 17.5 g/dL (ref 13.0–18.0)
LYMPHS PCT: 10 %
Lymphs Abs: 1.4 10*3/uL (ref 1.0–3.6)
MCH: 32 pg (ref 26.0–34.0)
MCHC: 33.1 g/dL (ref 32.0–36.0)
MCV: 96.6 fL (ref 80.0–100.0)
Monocytes Absolute: 2.1 10*3/uL — ABNORMAL HIGH (ref 0.2–1.0)
Monocytes Relative: 15 %
NEUTROS ABS: 10.5 10*3/uL — AB (ref 1.4–6.5)
NEUTROS PCT: 74 %
Platelets: 197 10*3/uL (ref 150–440)
RBC: 5.48 MIL/uL (ref 4.40–5.90)
RDW: 14.9 % — ABNORMAL HIGH (ref 11.5–14.5)
WBC: 14.1 10*3/uL — ABNORMAL HIGH (ref 3.8–10.6)

## 2017-08-13 LAB — URINALYSIS, COMPLETE (UACMP) WITH MICROSCOPIC
Bacteria, UA: NONE SEEN
Bilirubin Urine: NEGATIVE
Glucose, UA: NEGATIVE mg/dL
Ketones, ur: NEGATIVE mg/dL
Leukocytes, UA: NEGATIVE
Nitrite: NEGATIVE
Protein, ur: NEGATIVE mg/dL
Specific Gravity, Urine: 1.014 (ref 1.005–1.030)
Squamous Epithelial / HPF: NONE SEEN
pH: 7 (ref 5.0–8.0)

## 2017-08-13 LAB — BASIC METABOLIC PANEL WITH GFR
Anion gap: 10 (ref 5–15)
BUN: 19 mg/dL (ref 6–20)
CO2: 25 mmol/L (ref 22–32)
Calcium: 8.6 mg/dL — ABNORMAL LOW (ref 8.9–10.3)
Chloride: 100 mmol/L — ABNORMAL LOW (ref 101–111)
Creatinine, Ser: 1.71 mg/dL — ABNORMAL HIGH (ref 0.61–1.24)
GFR calc Af Amer: 42 mL/min — ABNORMAL LOW (ref >60)
GFR calc non Af Amer: 37 mL/min — ABNORMAL LOW (ref >60)
Glucose, Bld: 85 mg/dL (ref 65–99)
Potassium: 3.5 mmol/L (ref 3.5–5.1)
Sodium: 135 mmol/L (ref 135–145)

## 2017-08-13 LAB — INFLUENZA PANEL BY PCR (TYPE A & B)
Influenza A By PCR: POSITIVE — AB
Influenza B By PCR: NEGATIVE

## 2017-08-13 LAB — PROCALCITONIN: PROCALCITONIN: 0.96 ng/mL

## 2017-08-13 LAB — TROPONIN I: Troponin I: 0.05 ng/mL (ref <0.03)

## 2017-08-13 LAB — LACTIC ACID, PLASMA: Lactic Acid, Venous: 1.4 mmol/L (ref 0.5–1.9)

## 2017-08-13 MED ORDER — IPRATROPIUM-ALBUTEROL 0.5-2.5 (3) MG/3ML IN SOLN
3.0000 mL | Freq: Four times a day (QID) | RESPIRATORY_TRACT | Status: DC
Start: 2017-08-14 — End: 2017-08-14
  Administered 2017-08-14: 3 mL via RESPIRATORY_TRACT
  Filled 2017-08-13 (×2): qty 3

## 2017-08-13 MED ORDER — ACETAMINOPHEN 325 MG PO TABS
650.0000 mg | ORAL_TABLET | Freq: Four times a day (QID) | ORAL | Status: DC | PRN
  Administered 2017-08-14: 650 mg via ORAL
  Filled 2017-08-13: qty 2

## 2017-08-13 MED ORDER — SODIUM CHLORIDE 0.9 % IV BOLUS (SEPSIS)
1000.0000 mL | Freq: Once | INTRAVENOUS | Status: AC
  Administered 2017-08-13: 1000 mL via INTRAVENOUS

## 2017-08-13 MED ORDER — ONDANSETRON HCL 4 MG PO TABS: 4.0000 mg | ORAL_TABLET | Freq: Four times a day (QID) | ORAL | Status: DC | PRN

## 2017-08-13 MED ORDER — SODIUM CHLORIDE 0.9 % IV SOLN
500.0000 mg | Freq: Once | INTRAVENOUS | Status: AC
  Administered 2017-08-13: 500 mg via INTRAVENOUS
  Filled 2017-08-13: qty 500

## 2017-08-13 MED ORDER — SODIUM CHLORIDE 0.9 % IV SOLN: 1.0000 g | INTRAVENOUS | Status: DC

## 2017-08-13 MED ORDER — ACETAMINOPHEN 325 MG PO TABS
650.0000 mg | ORAL_TABLET | Freq: Once | ORAL | Status: AC
  Administered 2017-08-13: 650 mg via ORAL
  Filled 2017-08-13: qty 2

## 2017-08-13 MED ORDER — SODIUM CHLORIDE 0.9 % IV SOLN
1.0000 g | INTRAVENOUS | Status: DC
  Administered 2017-08-14: 1 g via INTRAVENOUS
  Filled 2017-08-13: qty 10

## 2017-08-13 MED ORDER — BRIMONIDINE TARTRATE 0.15 % OP SOLN
1.0000 [drp] | Freq: Two times a day (BID) | OPHTHALMIC | Status: DC
  Administered 2017-08-13 – 2017-08-14 (×2): 1 [drp] via OPHTHALMIC
  Filled 2017-08-13: qty 5

## 2017-08-13 MED ORDER — DORZOLAMIDE HCL 2 % OP SOLN
1.0000 [drp] | Freq: Two times a day (BID) | OPHTHALMIC | Status: DC
  Administered 2017-08-13 – 2017-08-14 (×2): 1 [drp] via OPHTHALMIC
  Filled 2017-08-13: qty 10

## 2017-08-13 MED ORDER — IPRATROPIUM-ALBUTEROL 0.5-2.5 (3) MG/3ML IN SOLN
3.0000 mL | RESPIRATORY_TRACT | Status: DC
  Administered 2017-08-13 (×2): 3 mL via RESPIRATORY_TRACT
  Filled 2017-08-13 (×2): qty 3

## 2017-08-13 MED ORDER — MOMETASONE FURO-FORMOTEROL FUM 200-5 MCG/ACT IN AERO
2.0000 | INHALATION_SPRAY | Freq: Two times a day (BID) | RESPIRATORY_TRACT | Status: DC
  Administered 2017-08-14: 2 via RESPIRATORY_TRACT
  Filled 2017-08-13: qty 8.8

## 2017-08-13 MED ORDER — LATANOPROST 0.005 % OP SOLN
1.0000 [drp] | Freq: Every day | OPHTHALMIC | Status: DC
  Filled 2017-08-13: qty 2.5

## 2017-08-13 MED ORDER — CEFTRIAXONE SODIUM 1 G IJ SOLR
1.0000 g | Freq: Once | INTRAMUSCULAR | Status: AC
  Administered 2017-08-13: 1 g via INTRAVENOUS
  Filled 2017-08-13: qty 10

## 2017-08-13 MED ORDER — GABAPENTIN 400 MG PO CAPS
400.0000 mg | ORAL_CAPSULE | Freq: Every day | ORAL | Status: DC
  Administered 2017-08-13: 400 mg via ORAL
  Filled 2017-08-13: qty 1

## 2017-08-13 MED ORDER — PANTOPRAZOLE SODIUM 40 MG PO TBEC
40.0000 mg | DELAYED_RELEASE_TABLET | Freq: Every day | ORAL | Status: DC
  Administered 2017-08-13 – 2017-08-14 (×2): 40 mg via ORAL
  Filled 2017-08-13 (×2): qty 1

## 2017-08-13 MED ORDER — ATORVASTATIN CALCIUM 20 MG PO TABS
40.0000 mg | ORAL_TABLET | Freq: Every day | ORAL | Status: DC
  Administered 2017-08-13: 40 mg via ORAL
  Filled 2017-08-13: qty 2

## 2017-08-13 MED ORDER — SODIUM CHLORIDE 0.9 % IV SOLN
500.0000 mg | INTRAVENOUS | Status: DC
  Administered 2017-08-14: 500 mg via INTRAVENOUS
  Filled 2017-08-13: qty 500

## 2017-08-13 MED ORDER — ASPIRIN EC 81 MG PO TBEC
81.0000 mg | DELAYED_RELEASE_TABLET | Freq: Every day | ORAL | Status: DC
  Administered 2017-08-13 – 2017-08-14 (×2): 81 mg via ORAL
  Filled 2017-08-13 (×2): qty 1

## 2017-08-13 MED ORDER — ADULT MULTIVITAMIN W/MINERALS CH
1.0000 | ORAL_TABLET | Freq: Every day | ORAL | Status: DC
  Administered 2017-08-13 – 2017-08-14 (×2): 1 via ORAL
  Filled 2017-08-13 (×4): qty 1

## 2017-08-13 MED ORDER — OSELTAMIVIR PHOSPHATE 75 MG PO CAPS: 75.0000 mg | ORAL_CAPSULE | Freq: Two times a day (BID) | ORAL | Status: DC

## 2017-08-13 MED ORDER — OSELTAMIVIR PHOSPHATE 30 MG PO CAPS
30.0000 mg | ORAL_CAPSULE | Freq: Two times a day (BID) | ORAL | Status: DC
  Administered 2017-08-14: 30 mg via ORAL
  Filled 2017-08-13 (×2): qty 1

## 2017-08-13 MED ORDER — RIVAROXABAN 10 MG PO TABS
10.0000 mg | ORAL_TABLET | Freq: Every day | ORAL | Status: DC
  Administered 2017-08-13: 10 mg via ORAL
  Filled 2017-08-13 (×2): qty 1

## 2017-08-13 MED ORDER — ONDANSETRON HCL 4 MG/2ML IJ SOLN
4.0000 mg | Freq: Four times a day (QID) | INTRAMUSCULAR | Status: DC | PRN
Start: 2017-08-13 — End: 2017-08-14
  Administered 2017-08-13: 4 mg via INTRAVENOUS
  Filled 2017-08-13: qty 2

## 2017-08-13 MED ORDER — TIOTROPIUM BROMIDE MONOHYDRATE 18 MCG IN CAPS
18.0000 ug | ORAL_CAPSULE | Freq: Every day | RESPIRATORY_TRACT | Status: DC
  Administered 2017-08-13 – 2017-08-14 (×2): 18 ug via RESPIRATORY_TRACT
  Filled 2017-08-13: qty 5

## 2017-08-13 MED ORDER — OSELTAMIVIR PHOSPHATE 30 MG PO CAPS
30.0000 mg | ORAL_CAPSULE | Freq: Two times a day (BID) | ORAL | Status: DC
  Filled 2017-08-13 (×2): qty 1

## 2017-08-13 MED ORDER — OSELTAMIVIR PHOSPHATE 75 MG PO CAPS
75.0000 mg | ORAL_CAPSULE | Freq: Once | ORAL | Status: AC
  Administered 2017-08-13: 75 mg via ORAL
  Filled 2017-08-13: qty 1

## 2017-08-13 MED ORDER — SODIUM CHLORIDE 0.9 % IV SOLN
INTRAVENOUS | Status: DC
  Administered 2017-08-13: 18:00:00 via INTRAVENOUS

## 2017-08-13 MED ORDER — IPRATROPIUM-ALBUTEROL 0.5-2.5 (3) MG/3ML IN SOLN: 3.0000 mL | RESPIRATORY_TRACT | Status: DC | PRN

## 2017-08-13 MED ORDER — ACETAMINOPHEN 650 MG RE SUPP: 650.0000 mg | Freq: Four times a day (QID) | RECTAL | Status: DC | PRN

## 2017-08-13 NOTE — ED Provider Notes (Addendum)
[facility] Emergency Department Provider Note ____________________________________________   First MD Initiated Contact with Patient 08/13/17 1235     (approximate)  I have reviewed the triage vital signs and the nursing notes.   HISTORY  Chief Complaint Shortness of Breath    HPI [name] is a 77 y.o. male with past medical history as noted below who presents with generalized weakness, gradual onset over the last several days, associated with fever, as well as with nonproductive cough.  Not relieved by any medications or interventions at home.  No associated shortness of breath per the patient.  No chest pain, vomiting, diarrhea, or urinary symptoms.   Past Medical History:  Diagnosis Date  . AB (asthmatic bronchitis)   . Allergic rhinitis   . Arthropathy   . Asthma   . Cellulitis   . DVT (deep venous thrombosis) ([place])   . Foreign body in skin   . Glaucoma   . Hx MRSA infection   . Hx MRSA infection   . Hypertension   . Insomnia   . Insomnia   . Mitral valve disorder   . Osteoarthrosis   . Upper back strain     Patient Active Problem List   Diagnosis Date Noted  . Sepsis ([place]) 11/05/2016  . Diverticulitis 11/05/2016  . History of deep vein thrombosis (DVT) of lower extremity 11/05/2016  . HCAP (healthcare-associated pneumonia) 08/29/2016  . NSTEMI (non-ST elevated myocardial infarction) ([place]) 08/18/2016  . Chronic obstructive pulmonary disease ([place]) 01/02/2016  . DVT, femoral, acute ([place]) 05/15/2015  . Recurrent pneumonia 12/21/2014  . Basal cell carcinoma 10/19/2014  . Abnormal liver enzymes 10/19/2014  . Personal history of methicillin resistant Staphylococcus aureus 10/19/2014  . Cannot sleep 10/19/2014  . Heel pain 10/19/2014  . Pain in shoulder 10/19/2014  . Back strain 10/19/2014  . Pulmonary aspergillosis, allergic bronchopulmonary type ([place]) 07/30/2014  . ABPA (allergic bronchopulmonary aspergillosis) ([place]) 07/30/2014    . CAP (community acquired pneumonia) 05/03/2014  . Abnormal CXR 04/01/2014  . Paroxysmal tachycardia ([place]) 06/23/2013  . Asymptomatic PVCs 06/23/2013  . Beat, premature ventricular 06/23/2013  . Cough 10/19/2012  . Allergic rhinitis 01/17/2009  . Asthma, chronic obstructive, without status asthmaticus ([place]) 11/04/2008  . CAFL (chronic airflow limitation) ([place]) 11/04/2008  . Essential hypertension 02/09/2008  . Arthropathia 08/28/2007  . Disorder of mitral valve 07/08/2005  . Benign prostatic hypertrophy without urinary obstruction 05/16/1997  . LBP (low back pain) 05/16/1997  . Degenerative arthritis of hip 03/18/1995    Past Surgical History:  Procedure Laterality Date  . APPENDECTOMY    . BRONCHOSCOPY    . LEFT HEART CATH AND CORONARY ANGIOGRAPHY N/A 08/19/2016   Procedure: Left Heart Cath and Coronary Angiography;  Surgeon: Dionisio David, MD;  Location: [facility];  Service: Cardiovascular;  Laterality: N/A;  . NASAL SINUS SURGERY  09/27/2014    Prior to Admission medications   Medication Sig Start Date End Date Taking? Authorizing Provider  ADVAIR HFA 230-21 MCG/ACT inhaler Inhale 2 puffs into the lungs 2 (two) times daily.  11/17/15   [provider]  albuterol (PROVENTIL HFA;VENTOLIN HFA) 108 (90 Base) MCG/ACT inhaler Inhale into the lungs. 2 puffs twice daily 12/26/16 12/26/17  [provider]  aspirin EC 81 MG EC tablet Take 1 tablet (81 mg total) by mouth daily. 08/21/16   Loletha Grayer, MD  atorvastatin (LIPITOR) 40 MG tablet Take 1 tablet (40 mg total) by mouth daily at 6 PM. 09/10/16  Jerrol Banana., MD  brimonidine (ALPHAGAN) 0.15 % ophthalmic solution Place 1 drop into both eyes 2 (two) times daily.  10/26/15   [provider]  Calcium Carb-Cholecalciferol (CALCIUM 600 + D PO) Take 1 tablet by mouth 2 (two) times daily.    [provider]  diltiazem (CARDIZEM CD) 180 MG 24 hr capsule Take 1 capsule (180 mg total) by  mouth daily. 09/10/16   Jerrol Banana., MD  dorzolamide (TRUSOPT) 2 % ophthalmic solution Place 1 drop into both eyes 2 (two) times daily.  08/31/15   [provider]  gabapentin (NEURONTIN) 400 MG capsule Take 400 mg by mouth at bedtime.  09/18/15   [provider]  isosorbide mononitrate (IMDUR) 30 MG 24 hr tablet Take 1 tablet (30 mg total) by mouth daily. 09/10/16   Jerrol Banana., MD  latanoprost (XALATAN) 0.005 % ophthalmic solution Place 1 drop into both eyes at bedtime.     [provider]  levofloxacin (LEVAQUIN) 500 MG tablet Take 1 tablet (500 mg total) daily by mouth. 04/24/17   Jerrol Banana., MD  Multiple Vitamin (MULTIVITAMIN) tablet Take 1 tablet by mouth daily.    [provider]  omeprazole (PRILOSEC) 20 MG capsule TAKE ONE CAPSULE BY MOUTH TWICE A DAY. 06/02/17   Jerrol Banana., MD  tiotropium (SPIRIVA HANDIHALER) 18 MCG inhalation capsule Place 18 mcg into inhaler and inhale daily.  02/06/15 08/18/17  [provider]  XARELTO 20 MG TABS tablet Take 10 mg by mouth.  08/05/16   [provider]    Allergies Timolol maleate [timolol maleate]; Bactrim [sulfamethoxazole-trimethoprim]; Budesonide-formoterol fumarate; and Lisinopril  Family History  Problem Relation Age of Onset  . Breast cancer Sister   . Heart failure Mother   . Pancreatic cancer Sister   . Alzheimer's disease Father   . Brain cancer Daughter     Social History Social History   Tobacco Use  . Smoking status: Former Smoker    Packs/day: 0.50    Years: 3.00    Pack years: 1.50    Types: Cigarettes    Last attempt to quit: 06/17/1958    Years since quitting: 59.1  . Smokeless tobacco: Never Used  . Tobacco comment: smoking while in college  Substance Use Topics  . Alcohol use: Yes    Alcohol/week: 1.2 - 1.8 oz    Types: 2 - 3 Glasses of wine per week  . Drug use: No    Review of Systems  Constitutional: Positive for  fever. Eyes: No redness. ENT: No sore throat. Cardiovascular: Denies chest pain. Respiratory: Denies shortness of breath.  Positive for cough. Gastrointestinal: No vomiting.  No diarrhea.  Genitourinary: Negative for dysuria.  Musculoskeletal: Negative for back pain. Skin: Negative for rash. Neurological: Negative for headache.   ____________________________________________   PHYSICAL EXAM:  VITAL SIGNS: ED Triage Vitals  Enc Vitals Group     BP 08/13/17 1203 132/76     Pulse Rate 08/13/17 1204 (!) 112     Resp 08/13/17 1203 (!) 24     Temp 08/13/17 1203 (!) 102.3 F (39.1 C)     Temp Source 08/13/17 1203 Oral     SpO2 08/13/17 1204 93 %     Weight 08/13/17 1204 167 lb 15.9 oz (76.2 kg)     Height --      Head Circumference --      Peak Flow --      Pain  Score --      Pain Loc --      Pain Edu? --      Excl. in [place]? --     Constitutional: Alert and oriented.  Weak appearing. Eyes: Conjunctivae are normal.  EOMI.   Head: Atraumatic. Nose: No congestion/rhinnorhea. Mouth/Throat: Mucous membranes are dry.   Neck: Normal range of motion.  Cardiovascular: Borderline tachycardic, regular rhythm. Grossly normal heart sounds.  Good peripheral circulation. Respiratory: Normal respiratory effort.  No retractions.  Somewhat decreased breath sounds bilaterally. Gastrointestinal: Soft and nontender. No distention.  Genitourinary: No flank tenderness. Musculoskeletal: No lower extremity edema.  Extremities warm and well perfused.  Neurologic:  Normal speech and language. No gross focal neurologic deficits are appreciated.  Skin:  Skin is warm and dry. No rash noted. Psychiatric: Mood and affect are normal. Speech and behavior are normal.  ____________________________________________   LABS (all labs ordered are listed, but only abnormal results are displayed)  Labs Reviewed  INFLUENZA PANEL BY PCR (TYPE A & B) - Abnormal; Notable for the following components:      Result  Value   Influenza A By PCR POSITIVE (*)    All other components within normal limits  TROPONIN I - Abnormal; Notable for the following components:   Troponin I 0.05 (*)    All other components within normal limits  BASIC METABOLIC PANEL - Abnormal; Notable for the following components:   Chloride 100 (*)    Creatinine, Ser 1.71 (*)    Calcium 8.6 (*)    GFR calc non Af Amer 37 (*)    GFR calc Af Amer 42 (*)    All other components within normal limits  CBC WITH DIFFERENTIAL/PLATELET - Abnormal; Notable for the following components:   WBC 14.1 (*)    HCT 52.9 (*)    RDW 14.9 (*)    Neutro Abs 10.5 (*)    Monocytes Absolute 2.1 (*)    All other components within normal limits  CULTURE, BLOOD (ROUTINE X 2)  CULTURE, BLOOD (ROUTINE X 2)  LACTIC ACID, PLASMA  CBC WITH DIFFERENTIAL/PLATELET  URINALYSIS, COMPLETE (UACMP) WITH MICROSCOPIC   ____________________________________________  EKG  ED ECG REPORT I, Arta Silence, the attending physician, personally viewed and interpreted this ECG.  Date: 08/13/2017 EKG Time: 1217 Rate: 108 Rhythm: Sinus tachycardia QRS Axis: normal Intervals: Left anterior fascicular block ST/T Wave abnormalities: Nonspecific ST abnormalities inferiorly Narrative Interpretation: no evidence of acute ischemia   ____________________________________________  RADIOLOGY  CXR: Bibasilar opacities ____________________________________________   PROCEDURES  Procedure(s) performed: No  Procedures  Critical Care performed: No ____________________________________________   INITIAL IMPRESSION / ASSESSMENT AND PLAN / ED COURSE  Pertinent labs & imaging results that were available during my care of the patient were reviewed by me and considered in my medical decision making (see chart for details).  77 year old male with past medical history as noted above presents with generalized weakness over the last several days, associated with fever.   Per triage there was shortness of breath, patient denies this to me.  He does have, however, endorse cough.  I reviewed the past medical records in epic; the patient was last seen in ED several months ago for pneumonia which was treated as an outpatient.  Differential includes pneumonia, acute bronchitis, influenza, other viral syndrome, versus UTI or other source of infection.  Plan: Chest x-ray, flu swab, UA, lab workup, IV fluids, and reassess.    ----------------------------------------- 2:13 PM on 08/13/2017 -----------------------------------------  Patient is flu  positive.  Chest x-ray shows no focal infiltrate but some bibasilar opacities so I will treat for community acquired pneumonia as well.  Given patient's weakness, and his hypoxia (patient has O2 sat 90-92% on 2 L nasal cannula, and is not on home O2 normally) he will require admission.  I signed the patient out to the hospitalist.  ____________________________________________   FINAL CLINICAL IMPRESSION(S) / ED DIAGNOSES  Final diagnoses:  Influenza A  Hypoxia      NEW MEDICATIONS STARTED DURING THIS VISIT:  New Prescriptions   No medications on file     Note:  This document was prepared using Dragon voice recognition software and may include unintentional dictation errors.       Arta Silence, MD 08/13/17 1734

## 2017-08-13 NOTE — ED Triage Notes (Signed)
Pt to ED via POV from [facility]. Pt arrived at [facility] laying in the back seat of the car because he was too weak to sit up. Pt has had fever and cough since yesterday. Pt A & O x 3

## 2017-08-13 NOTE — ED Notes (Signed)
Alma Friendly, ED tech called to transport patient to the floor.

## 2017-08-13 NOTE — Progress Notes (Signed)
Pt refused to wait for instructions from the pharmacy tech to take his eye drops. After asking me could he see if they are the right ones he decided to take them against my instructions to hold. The meds he took were Trusopt and Alphagan.

## 2017-08-13 NOTE — ED Notes (Signed)
Pt placed on 2 liter O2 via [place] 

## 2017-08-13 NOTE — Progress Notes (Signed)
Abigail Marsiglia is a a 77 y/o man with history of recurrent pneumonia and Burkholderia sepsis who presented to the clinic today for fever and weakness. His appointment was at 12:00 PM today but prior to his appointment, his wife approached the front desk saying she needed help transferring him from the vehicle to the clinic. I along with two medical assistants met her and this patient at their car in front of our clinic building. Upon my arrival, this patient was lying supine in the back row of the vehicle. He appeared tachypneic and his oxygen saturation was fluctuating between 86% and 88%, his pulse was 110. He was pallid. He responded to direct questioning. He appeared toxic. Based on brief examination, concerned for sepsis. Advised wife to go directly to ER or that we would call ambulance for her. She elected to drive him to the ER. I called [facility] and spoke to the triage nurse about this patient and my concerns.

## 2017-08-13 NOTE — H&P (Signed)
[place] [facility] [place] NAME: [name]    MR#:  [id number]  DATE OF BIRTH:  1940-10-16  DATE OF ADMISSION:  08/13/2017  PRIMARY CARE PHYSICIAN: Jerrol Banana., MD   REQUESTING/REFERRING PHYSICIAN: Arta Silence, MD  CHIEF COMPLAINT:   Chief Complaint  Patient presents with  . Shortness of Breath    HISTORY OF PRESENT ILLNESS: [name]  is a 77 y.o. male with a known history of allergic rhinitis, asthma, previous history of DVT, glaucoma who is presenting with generalized weakness and shortness of breath.  Patient's been having shortness of breath ongoing for the past few days and cough.  Has not had any fevers or chills.  Patient came to the ER with these symptoms and was noted to have the flu chest x-ray was inconclusive.  He is noted to have a WBC count that is elevated.  He denies any chest pain or palpitations. PAST MEDICAL HISTORY:   Past Medical History:  Diagnosis Date  . AB (asthmatic bronchitis)   . Allergic rhinitis   . Arthropathy   . Asthma   . Cellulitis   . DVT (deep venous thrombosis) ([place])   . Foreign body in skin   . Glaucoma   . Hx MRSA infection   . Hx MRSA infection   . Hypertension   . Insomnia   . Insomnia   . Mitral valve disorder   . Osteoarthrosis   . Upper back strain     PAST SURGICAL HISTORY:  Past Surgical History:  Procedure Laterality Date  . APPENDECTOMY    . BRONCHOSCOPY    . LEFT HEART CATH AND CORONARY ANGIOGRAPHY N/A 08/19/2016   Procedure: Left Heart Cath and Coronary Angiography;  Surgeon: Dionisio David, MD;  Location: [facility];  Service: Cardiovascular;  Laterality: N/A;  . NASAL SINUS SURGERY  09/27/2014    SOCIAL HISTORY:  Social History   Tobacco Use  . Smoking status: Former Smoker    Packs/day: 0.50    Years: 3.00    Pack years: 1.50    Types: Cigarettes    Last attempt to quit: 06/17/1958    Years since quitting: 59.1  . Smokeless tobacco: Never Used   . Tobacco comment: smoking while in college  Substance Use Topics  . Alcohol use: Yes    Alcohol/week: 1.2 - 1.8 oz    Types: 2 - 3 Glasses of wine per week    FAMILY HISTORY:  Family History  Problem Relation Age of Onset  . Breast cancer Sister   . Heart failure Mother   . Pancreatic cancer Sister   . Alzheimer's disease Father   . Brain cancer Daughter     DRUG ALLERGIES:  Allergies  Allergen Reactions  . Timolol Maleate [Timolol Maleate] Shortness Of Breath  . Bactrim [Sulfamethoxazole-Trimethoprim]     Pt says "it takes his skin pigment off  . Budesonide-Formoterol Fumarate Other (See Comments)    SYMBICORT Patient reports dizziness and trembling/shaky feeling.  . Lisinopril     cough    REVIEW OF SYSTEMS:   CONSTITUTIONAL: Positive fever, positive fatigue or weakness.  EYES: No blurred or double vision.  EARS, NOSE, AND THROAT: No tinnitus or ear pain.  RESPIRATORY: No cough, positive shortness of breath, wheezing or hemoptysis.  CARDIOVASCULAR: No chest pain, orthopnea, edema.  GASTROINTESTINAL: No nausea, vomiting, diarrhea or abdominal pain.  GENITOURINARY: No dysuria, hematuria.  ENDOCRINE: No polyuria, nocturia,  HEMATOLOGY: No  anemia, easy bruising or bleeding SKIN: No rash or lesion. MUSCULOSKELETAL: No joint pain or arthritis.   NEUROLOGIC: No tingling, numbness, weakness.  PSYCHIATRY: No anxiety or depression.   MEDICATIONS AT HOME:  Prior to Admission medications   Medication Sig Start Date End Date Taking? Authorizing Provider  ADVAIR HFA 230-21 MCG/ACT inhaler Inhale 2 puffs into the lungs 2 (two) times daily.  11/17/15  Yes [provider]  albuterol (PROVENTIL HFA;VENTOLIN HFA) 108 (90 Base) MCG/ACT inhaler Inhale into the lungs. 2 puffs twice daily 12/26/16 12/26/17 Yes [provider]  aspirin EC 81 MG EC tablet Take 1 tablet (81 mg total) by mouth daily. 08/21/16  Yes Loletha Grayer, MD  atorvastatin (LIPITOR) 40 MG tablet Take  1 tablet (40 mg total) by mouth daily at 6 PM. 09/10/16  Yes Jerrol Banana., MD  brimonidine [facility]) 0.15 % ophthalmic solution Place 1 drop into both eyes 2 (two) times daily.  10/26/15  Yes [provider]  Calcium Carb-Cholecalciferol (CALCIUM 600 + D PO) Take 1 tablet by mouth 2 (two) times daily.   Yes [provider]  diltiazem (CARDIZEM CD) 180 MG 24 hr capsule Take 1 capsule (180 mg total) by mouth daily. 09/10/16  Yes Jerrol Banana., MD  dorzolamide (TRUSOPT) 2 % ophthalmic solution Place 1 drop into both eyes 2 (two) times daily.  08/31/15  Yes [provider]  gabapentin (NEURONTIN) 400 MG capsule Take 400 mg by mouth at bedtime.  09/18/15  Yes [provider]  isosorbide mononitrate (IMDUR) 30 MG 24 hr tablet Take 1 tablet (30 mg total) by mouth daily. 09/10/16  Yes Jerrol Banana., MD  latanoprost (XALATAN) 0.005 % ophthalmic solution Place 1 drop into both eyes at bedtime.    Yes [provider]  Multiple Vitamin (MULTIVITAMIN) tablet Take 1 tablet by mouth daily.   Yes [provider]  omeprazole (PRILOSEC) 20 MG capsule TAKE ONE CAPSULE BY MOUTH TWICE A DAY. 06/02/17  Yes Jerrol Banana., MD  tiotropium (SPIRIVA HANDIHALER) 18 MCG inhalation capsule Place 18 mcg into inhaler and inhale daily.  02/06/15 08/18/17 Yes [provider]  XARELTO 10 MG TABS tablet Take 10 mg by mouth daily.  08/05/16  Yes [provider]  levofloxacin (LEVAQUIN) 500 MG tablet Take 1 tablet (500 mg total) daily by mouth. Patient not taking: Reported on 08/13/2017 04/24/17   Jerrol Banana., MD      PHYSICAL EXAMINATION:   VITAL SIGNS: Blood pressure (!) 110/55, pulse (!) 101, temperature 100.2 F (37.9 C), temperature source Oral, resp. rate (!) 23, weight 167 lb 15.9 oz (76.2 kg), SpO2 92 %.  GENERAL:  77 y.o.-year-old patient lying in the bed with no acute distress.  EYES: Pupils equal, round, reactive  to light and accommodation. No scleral icterus. Extraocular muscles intact.  HEENT: Head atraumatic, normocephalic. Oropharynx and nasopharynx clear.  NECK:  Supple, no jugular venous distention. No thyroid enlargement, no tenderness.  LUNGS: Rhonchus breath sounds bilaterally CARDIOVASCULAR: S1, S2 normal. No murmurs, rubs, or gallops.  ABDOMEN: Soft, nontender, nondistended. Bowel sounds present. No organomegaly or mass.  EXTREMITIES: No pedal edema, cyanosis, or clubbing.  NEUROLOGIC: Cranial nerves II through XII are intact. Muscle strength 5/5 in all extremities. Sensation intact. Gait not checked.  PSYCHIATRIC: The patient is alert and oriented x 3.  SKIN: No obvious rash, lesion, or ulcer.   LABORATORY PANEL:   CBC Recent Labs  Lab 08/13/17 1254  WBC 14.1*  HGB 17.5  HCT 52.9*  PLT 197  MCV 96.6  MCH 32.0  MCHC 33.1  RDW 14.9*  LYMPHSABS 1.4  MONOABS 2.1*  EOSABS 0.1  BASOSABS 0.0   ------------------------------------------------------------------------------------------------------------------  Chemistries  Recent Labs  Lab 08/13/17 1254  NA 135  K 3.5  CL 100*  CO2 25  GLUCOSE 85  BUN 19  CREATININE 1.71*  CALCIUM 8.6*   ------------------------------------------------------------------------------------------------------------------ estimated creatinine clearance is 38.4 mL/min (A) (by C-G formula based on SCr of 1.71 mg/dL (H)). ------------------------------------------------------------------------------------------------------------------ No results for input(s): TSH, T4TOTAL, T3FREE, THYROIDAB in the last 72 hours.  Invalid input(s): FREET3   Coagulation profile No results for input(s): INR, PROTIME in the last 168 hours. ------------------------------------------------------------------------------------------------------------------- No results for input(s): DDIMER in the last 72  hours. -------------------------------------------------------------------------------------------------------------------  Cardiac Enzymes Recent Labs  Lab 08/13/17 1254  TROPONINI 0.05*   ------------------------------------------------------------------------------------------------------------------ Invalid input(s): POCBNP  ---------------------------------------------------------------------------------------------------------------  Urinalysis    Component Value Date/Time   COLORURINE YELLOW (A) 08/13/2017 1342   APPEARANCEUR CLEAR (A) 08/13/2017 1342   APPEARANCEUR Clear 04/07/2014 1745   LABSPEC 1.014 08/13/2017 1342   LABSPEC 1.012 04/07/2014 1745   PHURINE 7.0 08/13/2017 1342   GLUCOSEU NEGATIVE 08/13/2017 1342   GLUCOSEU Negative 04/07/2014 1745   HGBUR SMALL (A) 08/13/2017 1342   BILIRUBINUR NEGATIVE 08/13/2017 1342   BILIRUBINUR negative 10/03/2015 1008   BILIRUBINUR Negative 04/07/2014 1745   KETONESUR NEGATIVE 08/13/2017 1342   PROTEINUR NEGATIVE 08/13/2017 1342   UROBILINOGEN 0.2 10/03/2015 1008   NITRITE NEGATIVE 08/13/2017 1342   LEUKOCYTESUR NEGATIVE 08/13/2017 1342   LEUKOCYTESUR Negative 04/07/2014 1745     RADIOLOGY: Dg Chest 2 View  Result Date: 08/13/2017 CLINICAL DATA:  Fever and cough since yesterday.  Weakness today. EXAM: CHEST  2 VIEW COMPARISON:  CT chest 06/03/2017.  PA and lateral chest 05/22/2017. FINDINGS: Lung volumes are lower than with mild on the comparison plain films streaky basilar opacities likely due to atelectasis. Lungs are otherwise clear. Heart size is normal. No pneumothorax or pleural effusion. No acute bony abnormality. IMPRESSION: Streaky bibasilar airspace opacities are likely due to atelectasis in this low volume chest. Electronically Signed   By: Inge Rise M.D.   On: 08/13/2017 12:43    EKG: Orders placed or performed during the hospital encounter of 08/13/17  . ED EKG  . ED EKG    IMPRESSION AND  PLAN: Patient is a 77 year old white male presenting with generalized weakness shortness of breath  1.  Shortness of breath likely related to the flu possible pneumonia I will continue his ceftriaxone azithromycin Check pro-calcitonin Change inhalers to nebs every 6 hours, no wheezing I will hold off on steroids  2.  Influenza A we will treat with Tamiflu  3.  Previous history of DVT continue therapy with Xarelto  4.  Hypertension hold Cardizem give IV fluids monitor blood pressure  5.  Elevated troponin due to demand ischemia follow troponin levels  6.  Hyperlipidemia continue lipid  7.  Acute renal failure on chronic kidney disease give IV fluids monitor renal function  8.  Continue Xarelto that should be sufficient for DVT prophylax    All the records are reviewed and case discussed with ED provider. Management plans discussed with the patient, family and they are in agreement.  CODE STATUS: Code Status History    Date Active Date Inactive Code Status Order ID Comments User Context   11/05/2016 22:58 11/07/2016 15:17 Full Code [id number]  Lance Coon, MD Inpatient  08/29/2016 22:54 08/31/2016 12:04 Full Code [id number]  Gladstone Lighter, MD Inpatient   08/18/2016 16:34 08/20/2016 16:11 Full Code [id number]  Vaughan Basta, MD Inpatient   11/04/2014 07:09 11/09/2014 14:50 Full Code [id number]  Molly Maduro, MD ED    Advance Directive Documentation     Most Recent Value  Type of Advance Directive  Healthcare Power of Attorney, Living will  Pre-existing out of facility DNR order (yellow form or pink MOST form)  No data  "MOST" Form in Place?  No data       TOTAL TIME TAKING CARE OF THIS PATIENT: 9minutes.    Dustin Flock M.D on 08/13/2017 at 2:51 PM  Between 7am to 6pm - Pager - [phone]  After 6pm go to [email or web address] - password [email or web address] [facility]  [facility]  Office  [phone]  CC: Primary care physician; Jerrol Banana.,  MD

## 2017-08-13 NOTE — ED Notes (Signed)
Transported to 216

## 2017-08-13 NOTE — ED Notes (Signed)
Pt transported to xray 

## 2017-08-14 ENCOUNTER — Telehealth: Payer: Self-pay | Admitting: Family Medicine

## 2017-08-14 LAB — CBC
HCT: 47 % (ref 40.0–52.0)
Hemoglobin: 15.5 g/dL (ref 13.0–18.0)
MCH: 32.7 pg (ref 26.0–34.0)
MCHC: 32.9 g/dL (ref 32.0–36.0)
MCV: 99.5 fL (ref 80.0–100.0)
PLATELETS: 155 10*3/uL (ref 150–440)
RBC: 4.73 MIL/uL (ref 4.40–5.90)
RDW: 14.8 % — AB (ref 11.5–14.5)
WBC: 10.7 10*3/uL — ABNORMAL HIGH (ref 3.8–10.6)

## 2017-08-14 LAB — BASIC METABOLIC PANEL
ANION GAP: 7 (ref 5–15)
BUN: 19 mg/dL (ref 6–20)
CALCIUM: 7.4 mg/dL — AB (ref 8.9–10.3)
CO2: 23 mmol/L (ref 22–32)
Chloride: 104 mmol/L (ref 101–111)
Creatinine, Ser: 1.26 mg/dL — ABNORMAL HIGH (ref 0.61–1.24)
GFR, EST NON AFRICAN AMERICAN: 53 mL/min — AB (ref >60)
Glucose, Bld: 71 mg/dL (ref 65–99)
POTASSIUM: 3.7 mmol/L (ref 3.5–5.1)
SODIUM: 134 mmol/L — AB (ref 135–145)

## 2017-08-14 MED ORDER — AMOXICILLIN-POT CLAVULANATE 875-125 MG PO TABS: 1.0000 | ORAL_TABLET | Freq: Two times a day (BID) | ORAL | 0 refills | Status: DC

## 2017-08-14 MED ORDER — AZITHROMYCIN 250 MG PO TABS: ORAL_TABLET | ORAL | 0 refills | Status: DC

## 2017-08-14 MED ORDER — NYSTATIN 100000 UNIT/ML MT SUSP: 5.0000 mL | Freq: Four times a day (QID) | OROMUCOSAL | 0 refills | Status: DC

## 2017-08-14 MED ORDER — OSELTAMIVIR PHOSPHATE 30 MG PO CAPS: 30.0000 mg | ORAL_CAPSULE | Freq: Two times a day (BID) | ORAL | 0 refills | Status: DC

## 2017-08-14 MED ORDER — AZITHROMYCIN 250 MG PO TABS: 250.0000 mg | ORAL_TABLET | Freq: Every day | ORAL | Status: DC

## 2017-08-14 MED ORDER — AMOXICILLIN-POT CLAVULANATE 875-125 MG PO TABS: 1.0000 | ORAL_TABLET | Freq: Two times a day (BID) | ORAL | Status: DC

## 2017-08-14 NOTE — Discharge Instructions (Signed)

## 2017-08-14 NOTE — Discharge Summary (Signed)
[place] at [place] NAME: [name]    MR#:  [id number]  DATE OF BIRTH:  [birth year]  DATE OF ADMISSION:  08/13/2017 ADMITTING PHYSICIAN: Dustin Flock, MD  DATE OF DISCHARGE: 08/14/2017  1:11 PM  PRIMARY CARE PHYSICIAN: Jerrol Banana., MD    ADMISSION DIAGNOSIS:  Influenza A [J10.1] Hypoxia [R09.02]  DISCHARGE DIAGNOSIS:  Active Problems:   SOB (shortness of breath)   SECONDARY DIAGNOSIS:   Past Medical History:  Diagnosis Date  . AB (asthmatic bronchitis)   . Allergic rhinitis   . Arthropathy   . Asthma   . Cellulitis   . DVT (deep venous thrombosis) ([place])   . Foreign body in skin   . Glaucoma   . Hx MRSA infection   . Hx MRSA infection   . Hypertension   . Insomnia   . Insomnia   . Mitral valve disorder   . Osteoarthrosis   . Upper back strain     HOSPITAL COURSE:   1.  Influenza A positive.  Patient started on Tamiflu. 2.  Unclear if there is a pneumonia.  Pro-calcitonin was elevated so the patient was given IV antibiotics ceftriaxone and azithromycin 2 days while here in the hospital.  Will prescribe Augmentin and 3 more days of Zithromax upon going home.  Patient has inhalers at home. 3.  Previous history of DVT on Xarelto 4.  Essential hypertension continue Cardizem 5.  Elevated troponin demand ischemia secondary to shortness of breath and influenza 6.  Hyperlipidemia unspecified on atorvastatin 7.  Acute kidney injury on chronic kidney disease stage III.  Improved with IV fluids.   DISCHARGE CONDITIONS:   Satisfactory  CONSULTS OBTAINED:  None  DRUG ALLERGIES:   Allergies  Allergen Reactions  . Timolol Maleate [Timolol Maleate] Shortness Of Breath  . Bactrim [Sulfamethoxazole-Trimethoprim]     Pt says "it takes his skin pigment off  . Budesonide-Formoterol Fumarate Other (See Comments)    SYMBICORT Patient reports dizziness and trembling/shaky feeling.  . Lisinopril     cough     DISCHARGE MEDICATIONS:   Allergies as of 08/14/2017      Reactions   Timolol Maleate [timolol Maleate] Shortness Of Breath   Bactrim [sulfamethoxazole-trimethoprim]    Pt says "it takes his skin pigment off   Budesonide-formoterol Fumarate Other (See Comments)   SYMBICORT Patient reports dizziness and trembling/shaky feeling.   Lisinopril    cough      Medication List    STOP taking these medications   levofloxacin 500 MG tablet Commonly known as:  LEVAQUIN     TAKE these medications   ADVAIR HFA 230-21 MCG/ACT inhaler Generic drug:  fluticasone-salmeterol Inhale 2 puffs into the lungs 2 (two) times daily.   albuterol 108 (90 Base) MCG/ACT inhaler Commonly known as:  PROVENTIL HFA;VENTOLIN HFA Inhale into the lungs. 2 puffs twice daily   amoxicillin-clavulanate 875-125 MG tablet Commonly known as:  AUGMENTIN Take 1 tablet by mouth every 12 (twelve) hours. Start taking on:  08/15/2017   aspirin 81 MG EC tablet Take 1 tablet (81 mg total) by mouth daily.   atorvastatin 40 MG tablet Commonly known as:  LIPITOR Take 1 tablet (40 mg total) by mouth daily at 6 PM.   azithromycin 250 MG tablet Commonly known as:  ZITHROMAX Take one tablet daily for three days Start taking on:  08/15/2017   brimonidine 0.15 % ophthalmic solution Commonly known as:  ALPHAGAN Place 1 drop into  both eyes 2 (two) times daily.   CALCIUM 600 + D PO Take 1 tablet by mouth daily.   diltiazem 180 MG 24 hr capsule Commonly known as:  CARDIZEM CD Take 1 capsule (180 mg total) by mouth daily.   dorzolamide 2 % ophthalmic solution Commonly known as:  TRUSOPT Place 1 drop into both eyes 2 (two) times daily.   gabapentin 400 MG capsule Commonly known as:  NEURONTIN Take 400 mg by mouth at bedtime.   isosorbide mononitrate 30 MG 24 hr tablet Commonly known as:  IMDUR Take 1 tablet (30 mg total) by mouth daily.   latanoprost 0.005 % ophthalmic solution Commonly known as:  XALATAN Place  1 drop into both eyes at bedtime.   multivitamin tablet Take 1 tablet by mouth daily.   nystatin 100000 UNIT/ML suspension Commonly known as:  MYCOSTATIN Take 5 mLs (500,000 Units total) by mouth 4 (four) times daily.   omeprazole 20 MG capsule Commonly known as:  PRILOSEC TAKE ONE CAPSULE BY MOUTH TWICE A DAY.   oseltamivir 30 MG capsule Commonly known as:  TAMIFLU Take 1 capsule (30 mg total) by mouth 2 (two) times daily.   SPIRIVA HANDIHALER 18 MCG inhalation capsule Generic drug:  tiotropium Place 18 mcg into inhaler and inhale daily.   XARELTO 10 MG Tabs tablet Generic drug:  rivaroxaban Take 10 mg by mouth daily.        DISCHARGE INSTRUCTIONS:   Follow-up PMD 1 week  If you experience worsening of your admission symptoms, develop shortness of breath, life threatening emergency, suicidal or homicidal thoughts you must seek medical attention immediately by calling 911 or calling your MD immediately  if symptoms less severe.  You Must read complete instructions/literature along with all the possible adverse reactions/side effects for all the Medicines you take and that have been prescribed to you. Take any new Medicines after you have completely understood and accept all the possible adverse reactions/side effects.   Please note  You were cared for by a hospitalist during your hospital stay. If you have any questions about your discharge medications or the care you received while you were in the hospital after you are discharged, you can call the unit and asked to speak with the hospitalist on call if the hospitalist that took care of you is not available. Once you are discharged, your primary care physician will handle any further medical issues. Please note that NO REFILLS for any discharge medications will be authorized once you are discharged, as it is imperative that you return to your primary care physician (or establish a relationship with a primary care physician if  you do not have one) for your aftercare needs so that they can reassess your need for medications and monitor your lab values.    Today   CHIEF COMPLAINT:   Chief Complaint  Patient presents with  . Shortness of Breath    HISTORY OF PRESENT ILLNESS:  [name]  is a 77 y.o. male  presented with shortness of breath and found to have influenza positive   VITAL SIGNS:  Blood pressure 120/60, pulse 91, temperature 98.7 F (37.1 C), temperature source Oral, resp. rate 20, height 6\' 2"  (1.88 m), weight 79.8 kg (175 lb 14.8 oz), SpO2 98 %.    PHYSICAL EXAMINATION:  GENERAL:  77 y.o.-year-old patient lying in the bed with no acute distress.  EYES: Pupils equal, round, reactive to light and accommodation. No scleral icterus. Extraocular muscles intact.  HEENT: Head  atraumatic, normocephalic. Oropharynx and nasopharynx clear.  NECK:  Supple, no jugular venous distention. No thyroid enlargement, no tenderness.  LUNGS: Normal breath sounds bilaterally, no wheezing, rales,rhonchi or crepitation. No use of accessory muscles of respiration.  CARDIOVASCULAR: S1, S2 normal. No murmurs, rubs, or gallops.  ABDOMEN: Soft, non-tender, non-distended. Bowel sounds present. No organomegaly or mass.  EXTREMITIES: No pedal edema, cyanosis, or clubbing.  NEUROLOGIC: Cranial nerves II through XII are intact. Muscle strength 5/5 in all extremities. Sensation intact. Gait not checked.  PSYCHIATRIC: The patient is alert and oriented x 3.  SKIN: No obvious rash, lesion, or ulcer.   DATA REVIEW:   CBC Recent Labs  Lab 08/14/17 0343  WBC 10.7*  HGB 15.5  HCT 47.0  PLT 155    Chemistries  Recent Labs  Lab 08/14/17 0343  NA 134*  K 3.7  CL 104  CO2 23  GLUCOSE 71  BUN 19  CREATININE 1.26*  CALCIUM 7.4*    Cardiac Enzymes Recent Labs  Lab 08/13/17 1254  TROPONINI 0.05*    Microbiology Results  Results for orders placed or performed during the hospital encounter of 08/13/17   Culture, blood (routine x 2)     Status: None (Preliminary result)   Collection Time: 08/13/17 12:55 PM  Result Value Ref Range Status   Specimen Description BLOOD LAC  Final   Special Requests   Final    BOTTLES DRAWN AEROBIC AND ANAEROBIC Blood Culture adequate volume   Culture   Final    NO GROWTH < 24 HOURS Performed at [facility], [address], [place], [place] [address]    Report Status PENDING  Incomplete  Culture, blood (routine x 2)     Status: None (Preliminary result)   Collection Time: 08/13/17 12:55 PM  Result Value Ref Range Status   Specimen Description BLOOD RIGHT WRIST  Final   Special Requests   Final    BOTTLES DRAWN AEROBIC AND ANAEROBIC Blood Culture adequate volume   Culture   Final    NO GROWTH < 24 HOURS Performed at [facility], [address], [place], [place] [address]    Report Status PENDING  Incomplete    RADIOLOGY:  Dg Chest 2 View  Result Date: 08/13/2017 CLINICAL DATA:  Fever and cough since yesterday.  Weakness today. EXAM: CHEST  2 VIEW COMPARISON:  CT chest 06/03/2017.  PA and lateral chest 05/22/2017. FINDINGS: Lung volumes are lower than with mild on the comparison plain films streaky basilar opacities likely due to atelectasis. Lungs are otherwise clear. Heart size is normal. No pneumothorax or pleural effusion. No acute bony abnormality. IMPRESSION: Streaky bibasilar airspace opacities are likely due to atelectasis in this low volume chest. Electronically Signed   By: Inge Rise M.D.   On: 08/13/2017 12:43     Management plans discussed with the patient, family and they are in agreement.  CODE STATUS:     Code Status Orders  (From admission, onward)        Start     Ordered   08/13/17 1708  Full code  Continuous     08/13/17 1707    Code Status History    Date Active Date Inactive Code Status Order ID Comments User Context   11/05/2016 22:58 11/07/2016 15:17 Full Code [id number]  Lance Coon, MD  Inpatient   08/29/2016 22:54 08/31/2016 12:04 Full Code [id number]  Gladstone Lighter, MD Inpatient   08/18/2016 16:34 08/20/2016 16:11 Full Code [id number]  Vaughan Basta,  MD Inpatient   11/04/2014 07:09 11/09/2014 14:50 Full Code [id number]  Molly Maduro, MD ED    Advance Directive Documentation     Most Recent Value  Type of Advance Directive  Healthcare Power of Attorney, Living will  Pre-existing out of facility DNR order (yellow form or pink MOST form)  No data  "MOST" Form in Place?  No data      TOTAL TIME TAKING CARE OF THIS PATIENT: 35 minutes.    Loletha Grayer M.D on 08/14/2017 at 3:48 PM  Between 7am to 6pm - Pager - [phone]  After 6pm go to [email or web address] - password [email or web address] [facility] Office  [phone]  CC: Primary care physician; Jerrol Banana., MD

## 2017-08-14 NOTE — Telephone Encounter (Signed)
[facility] scheduled pt for 1 week hospital f/u on 08/21/17 @ 320 pm. Pt is being discharged today 08/14/17 and was treated for SOB. Please advise. Thanks TNP

## 2017-08-14 NOTE — Progress Notes (Signed)
SATURATION QUALIFICATIONS: (This note is used to comply with regulatory documentation for home oxygen)  Patient Saturations on Room Air at Rest = 93%  Patient Saturations on Room Air while Ambulating = 93%  Patient Saturations on 2 Liters of oxygen while Ambulating = 98%  Please briefly explain why patient does not need home oxygen: Pt oxygen level never fluctuated while walking therefore I do not feel they need home oxygen and they can go home without oxygen. The oxygen was acute while here in the hospital.

## 2017-08-14 NOTE — Progress Notes (Signed)
Pt was given D/C instructions and was made aware of his follow up appts and he stated that he will comply. I went over his med list and advised him of his changes and gave him the prescriptions. I removed his IV without incident and took him to be discharged to his wife.

## 2017-08-15 NOTE — Telephone Encounter (Signed)
Transition Care Management Follow-Up Telephone Call   Date discharged and where: [facility] on 08/14/17.  How have you been since you were released from the hospital? Doing ok, still weak. Declined fever, n/v/d and SOB.  Any patient concerns? None.  Items Reviewed:   Meds: verified  Allergies: verified  Dietary Changes Reviewed: n/a  Functional Questionnaire:  Independent-I Dependent-D  ADLs:   Dressing- I    Eating- I   Maintaining continence- I   Transferring- I   Transportation- I   Meal Prep- I   Managing Meds- I  Confirmed importance and Date/Time of follow-up visits scheduled: 08/21/17 @ 3:20 PM.   Confirmed with patient if condition worsens to call PCP or go to the Emergency Dept. Patient was given office number and encouraged to call back with questions or concerns: YES

## 2017-08-18 LAB — CULTURE, BLOOD (ROUTINE X 2)
CULTURE: NO GROWTH
Culture: NO GROWTH
SPECIAL REQUESTS: ADEQUATE
Special Requests: ADEQUATE

## 2017-08-21 ENCOUNTER — Ambulatory Visit
Admission: RE | Admit: 2017-08-21 | Discharge: 2017-08-21 | Disposition: A | Discharge status: Home / Self Care | Payer: PPO | Source: Ambulatory Visit | Attending: Family Medicine | Admitting: Family Medicine

## 2017-08-21 ENCOUNTER — Ambulatory Visit (INDEPENDENT_AMBULATORY_CARE_PROVIDER_SITE_OTHER): Payer: PPO | Admitting: Family Medicine

## 2017-08-21 VITALS — BP 140/80 | HR 76 | Temp 97.7°F | Resp 16 | Wt 170.0 lb

## 2017-08-21 DIAGNOSIS — R05 Cough: Secondary | ICD-10-CM | POA: Diagnosis not present

## 2017-08-21 DIAGNOSIS — R059 Cough, unspecified: Secondary | ICD-10-CM

## 2017-08-21 DIAGNOSIS — J189 Pneumonia, unspecified organism: Secondary | ICD-10-CM

## 2017-08-21 NOTE — Progress Notes (Signed)
[name]  MRN: [id number] DOB: 1940-07-05  Subjective:  HPI   The patient is a 77 year old male who presents for hospital follow up after being admitted for Influenza A.  He was inpatient on 08/13/17-08/14/2017.  He had positive Influenza A test and also diagnosed with Hypoxia.  Patient was treated with Tamiflu.  Patient was also treated int he hospital with IV Ceftriaxone and Azithromycin for coverage of possible pneumonia.  He was sent home with Augmentin and # more days of Zithromax .  Patient is doing better and states he has not had any fever or difficulty breathing since coming home from the hospital.  He does still have some cough but it is non productive  He does mention of course decreased energy and strength.  Patient Active Problem List   Diagnosis Date Noted  . SOB (shortness of breath) 08/13/2017  . Sepsis ([place]) 11/05/2016  . Diverticulitis 11/05/2016  . History of deep vein thrombosis (DVT) of lower extremity 11/05/2016  . HCAP (healthcare-associated pneumonia) 08/29/2016  . NSTEMI (non-ST elevated myocardial infarction) ([place]) 08/18/2016  . Chronic obstructive pulmonary disease ([place]) 01/02/2016  . DVT, femoral, acute ([place]) 05/15/2015  . Recurrent pneumonia 12/21/2014  . Basal cell carcinoma 10/19/2014  . Abnormal liver enzymes 10/19/2014  . Personal history of methicillin resistant Staphylococcus aureus 10/19/2014  . Cannot sleep 10/19/2014  . Heel pain 10/19/2014  . Pain in shoulder 10/19/2014  . Back strain 10/19/2014  . Pulmonary aspergillosis, allergic bronchopulmonary type ([place]) 07/30/2014  . ABPA (allergic bronchopulmonary aspergillosis) ([place]) 07/30/2014  . CAP (community acquired pneumonia) 05/03/2014  . Abnormal CXR 04/01/2014  . Paroxysmal tachycardia ([place]) 06/23/2013  . Asymptomatic PVCs 06/23/2013  . Beat, premature ventricular 06/23/2013  . Cough 10/19/2012  . Allergic rhinitis 01/17/2009  . Asthma, chronic obstructive, without status asthmaticus ([place])  11/04/2008  . CAFL (chronic airflow limitation) ([place]) 11/04/2008  . Essential hypertension 02/09/2008  . Arthropathia 08/28/2007  . Disorder of mitral valve 07/08/2005  . Benign prostatic hypertrophy without urinary obstruction 05/16/1997  . LBP (low back pain) 05/16/1997  . Degenerative arthritis of hip 03/18/1995    Past Medical History:  Diagnosis Date  . AB (asthmatic bronchitis)   . Allergic rhinitis   . Arthropathy   . Asthma   . Cellulitis   . DVT (deep venous thrombosis) ([place])   . Foreign body in skin   . Glaucoma   . Hx MRSA infection   . Hx MRSA infection   . Hypertension   . Insomnia   . Insomnia   . Mitral valve disorder   . Osteoarthrosis   . Upper back strain     Social History   Socioeconomic History  . Marital status: Married    Spouse name: Everlene Farrier  . Number of children: 2  . Years of education: College  . Highest education level: Not on file  Social Needs  . Financial resource strain: Not on file  . Food insecurity - worry: Not on file  . Food insecurity - inability: Not on file  . Transportation needs - medical: Not on file  . Transportation needs - non-medical: Not on file  Occupational History  . Occupation: Retired    Fish farm manager: RETIRED    CommentBiomedical scientist  Tobacco Use  . Smoking status: Former Smoker    Packs/day: 0.50    Years: 3.00    Pack years: 1.50    Types: Cigarettes    Last attempt to quit: 06/17/1958  Years since quitting: 59.2  . Smokeless tobacco: Never Used  . Tobacco comment: smoking while in college  Substance and Sexual Activity  . Alcohol use: Yes    Alcohol/week: 1.2 - 1.8 oz    Types: 2 - 3 Glasses of wine per week  . Drug use: No  . Sexual activity: Yes  Other Topics Concern  . Not on file  Social History Narrative   He is a married father of 2 daughters.   He smoked casually while in college for about 3 years less than a pack a day.   He drinks 3-4 glass of wine at night. He also has about 2 servings of  coffee a day   He is a very active gentleman with [employer] exercise program at least 3-4 days a week.   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   In addition to this standing his exercise, he does lots of gardening and uses a chain saw to cut wood.             Outpatient Encounter Medications as of 08/21/2017  Medication Sig  . ADVAIR HFA 230-21 MCG/ACT inhaler Inhale 2 puffs into the lungs 2 (two) times daily.   Marland Kitchen albuterol (PROVENTIL HFA;VENTOLIN HFA) 108 (90 Base) MCG/ACT inhaler Inhale into the lungs. 2 puffs twice daily  . aspirin EC 81 MG EC tablet Take 1 tablet (81 mg total) by mouth daily.  Marland Kitchen atorvastatin (LIPITOR) 40 MG tablet Take 1 tablet (40 mg total) by mouth daily at 6 PM.  . brimonidine (ALPHAGAN) 0.15 % ophthalmic solution Place 1 drop into both eyes 2 (two) times daily.   . Calcium Carb-Cholecalciferol (CALCIUM 600 + D PO) Take 1 tablet by mouth daily.   Marland Kitchen diltiazem (CARDIZEM CD) 180 MG 24 hr capsule Take 1 capsule (180 mg total) by mouth daily.  . dorzolamide (TRUSOPT) 2 % ophthalmic solution Place 1 drop into both eyes 2 (two) times daily.   Marland Kitchen gabapentin (NEURONTIN) 400 MG capsule Take 400 mg by mouth at bedtime.   . isosorbide mononitrate (IMDUR) 30 MG 24 hr tablet Take 1 tablet (30 mg total) by mouth daily.  Marland Kitchen latanoprost (XALATAN) 0.005 % ophthalmic solution Place 1 drop into both eyes at bedtime.   . Multiple Vitamin (MULTIVITAMIN) tablet Take 1 tablet by mouth daily.  Marland Kitchen oseltamivir (TAMIFLU) 30 MG capsule Take 1 capsule (30 mg total) by mouth 2 (two) times daily.  Alveda Reasons 10 MG TABS tablet Take 10 mg by mouth daily.   Marland Kitchen tiotropium (SPIRIVA HANDIHALER) 18 MCG inhalation capsule Place 18 mcg into inhaler and inhale daily.   . [DISCONTINUED] amoxicillin-clavulanate (AUGMENTIN) 875-125 MG tablet Take 1 tablet by mouth every 12 (twelve) hours.  . [DISCONTINUED] azithromycin (ZITHROMAX) 250 MG tablet Take one tablet daily for three days    . [DISCONTINUED] nystatin (MYCOSTATIN) 100000 UNIT/ML suspension Take 5 mLs (500,000 Units total) by mouth 4 (four) times daily.  . [DISCONTINUED] omeprazole (PRILOSEC) 20 MG capsule TAKE ONE CAPSULE BY MOUTH TWICE A DAY.   No facility-administered encounter medications on file as of 08/21/2017.     Allergies  Allergen Reactions  . Timolol Maleate [Timolol Maleate] Shortness Of Breath  . Bactrim [Sulfamethoxazole-Trimethoprim]     Pt says "it takes his skin pigment off  . Budesonide-Formoterol Fumarate Other (See Comments)    SYMBICORT Patient reports dizziness and trembling/shaky feeling.  . Lisinopril     cough    Review of  Systems  Constitutional: Positive for malaise/fatigue. Negative for fever.  Eyes: Negative.   Respiratory: Positive for shortness of breath (with exertion but imroving.). Negative for cough and wheezing.   Cardiovascular: Negative for chest pain, palpitations, orthopnea, claudication and leg swelling.  Gastrointestinal: Negative.   Skin: Negative.   Neurological: Positive for weakness.  Endo/Heme/Allergies: Negative.   Psychiatric/Behavioral: Negative.     Objective:  BP 140/80 (BP Location: Right Arm, Patient Position: Sitting, Cuff Size: Normal)   Pulse 76   Temp 97.7 F (36.5 C) (Oral)   Resp 16   Wt 170 lb (77.1 kg)   SpO2 98%   BMI 21.83 kg/m   Physical Exam  Constitutional: He is oriented to person, place, and time and well-developed, well-nourished, and in no distress.  HENT:  Head: Normocephalic and atraumatic.  Eyes: Conjunctivae are normal. No scleral icterus.  Neck: No thyromegaly present.  Cardiovascular: Normal rate, regular rhythm and normal heart sounds.  Pulmonary/Chest: Effort normal and breath sounds normal.  Abdominal: Soft.  Neurological: He is alert and oriented to person, place, and time.  Skin: Skin is warm and dry.  Psychiatric: Mood, memory, affect and judgment normal.    Assessment and Plan :  1. Recurrent  pneumonia  - DG Chest 2 View; Future  2. Cough  - DG Chest 2 View; Future 3.Recent Influenza A  I have done the exam and reviewed the chart and it is accurate to the best of my knowledge. Development worker, community has been used and  any errors in dictation or transcription are unintentional. Miguel Aschoff M.D. [facility]

## 2017-09-02 DIAGNOSIS — K143 Hypertrophy of tongue papillae: Secondary | ICD-10-CM | POA: Diagnosis not present

## 2017-09-15 ENCOUNTER — Other Ambulatory Visit: Payer: Self-pay

## 2017-09-15 ENCOUNTER — Encounter: Payer: Self-pay | Admitting: Family Medicine

## 2017-09-15 DIAGNOSIS — E785 Hyperlipidemia, unspecified: Secondary | ICD-10-CM

## 2017-09-15 DIAGNOSIS — I479 Paroxysmal tachycardia, unspecified: Secondary | ICD-10-CM

## 2017-09-15 MED ORDER — DILTIAZEM HCL ER COATED BEADS 180 MG PO CP24: 180.0000 mg | ORAL_CAPSULE | Freq: Every day | ORAL | 3 refills | Status: DC

## 2017-09-15 MED ORDER — ATORVASTATIN CALCIUM 40 MG PO TABS: 40.0000 mg | ORAL_TABLET | Freq: Every day | ORAL | 3 refills | Status: DC

## 2017-09-15 MED ORDER — ISOSORBIDE MONONITRATE ER 30 MG PO TB24: 30.0000 mg | ORAL_TABLET | Freq: Every day | ORAL | 3 refills | Status: DC

## 2017-09-15 MED ORDER — OMEPRAZOLE 20 MG PO CPDR: 20.0000 mg | DELAYED_RELEASE_CAPSULE | Freq: Every day | ORAL | 3 refills | Status: DC

## 2017-10-15 ENCOUNTER — Ambulatory Visit: Payer: PPO | Admitting: Podiatry

## 2017-10-16 ENCOUNTER — Ambulatory Visit: Payer: PPO | Admitting: Podiatry

## 2017-10-16 ENCOUNTER — Encounter: Payer: Self-pay | Admitting: Podiatry

## 2017-10-16 DIAGNOSIS — I82419 Acute embolism and thrombosis of unspecified femoral vein: Secondary | ICD-10-CM | POA: Diagnosis not present

## 2017-10-16 DIAGNOSIS — J411 Mucopurulent chronic bronchitis: Secondary | ICD-10-CM | POA: Diagnosis not present

## 2017-10-16 DIAGNOSIS — M79671 Pain in right foot: Secondary | ICD-10-CM | POA: Diagnosis not present

## 2017-10-16 DIAGNOSIS — M19079 Primary osteoarthritis, unspecified ankle and foot: Secondary | ICD-10-CM

## 2017-10-16 DIAGNOSIS — Q6651 Congenital pes planus, right foot: Secondary | ICD-10-CM

## 2017-10-16 DIAGNOSIS — M779 Enthesopathy, unspecified: Secondary | ICD-10-CM | POA: Diagnosis not present

## 2017-10-16 DIAGNOSIS — I479 Paroxysmal tachycardia, unspecified: Secondary | ICD-10-CM | POA: Diagnosis not present

## 2017-10-16 DIAGNOSIS — I25119 Atherosclerotic heart disease of native coronary artery with unspecified angina pectoris: Secondary | ICD-10-CM | POA: Diagnosis not present

## 2017-10-16 DIAGNOSIS — M778 Other enthesopathies, not elsewhere classified: Secondary | ICD-10-CM

## 2017-10-16 NOTE — Progress Notes (Signed)
This patient presents to the office with chief complaint of a painful right foot.  He presents the office and has marked at the site of maximum pain with an X.  He  says he is having increased pain and discomfort in his right foot.  He says he was treated by Dr. Milinda Pointer with injection therapy.  He says the pain eventually returned and now is painful walking on the outside top of his right foot.  He does have a history of capsulitis/arthritis of his Lisfranc's joint, right foot.  Patient is presently taking xarelto.  He presents the office for his painful right foot.    General Appearance  Alert, conversant and in no acute stress.  Vascular  Dorsalis pedis and posterior tibial  pulses are palpable  bilaterally.  Capillary return is within normal limits  bilaterally. Temperature is within normal limits  bilaterally.  Neurologic  Senn-Weinstein monofilament wire test within normal limits  bilaterally. Muscle power within normal limits bilaterally.  Nails normal nails noted with no evidence of fungal or bacterial infection.  Orthopedic  No limitations of motion of motion feet .  No crepitus or effusions noted.  No bony pathology or digital deformities noted. Generalized swelling noted on the dorsum of the right foot extending medially from the fifth metatarsal to the second metatarsal.  Palpable pain noted between the fourth and fifth metatarsals right foot.    Skin  normotropic skin with no porokeratosis noted bilaterally.  No signs of infections or ulcers noted.    DJD/Capsulitis Liz-Frank right foot.  ROV.  Discussed this condition with this patient.  Provided injection therapy in the fourth interspace proximally .  I considered recommending Mobic for the generalized swelling but decided against the Mobic since he is taking Xarelto. Patient was told to return to the office as needed for continued evaluation and treatment.   Gardiner Barefoot DPM

## 2017-12-01 ENCOUNTER — Telehealth: Payer: Self-pay

## 2017-12-01 NOTE — Telephone Encounter (Signed)
-----   Message from Virginia Crews, MD sent at 11/28/2017 11:40 AM EDT ----- Normal swallowing function on test.  No evidence of aspiration.  Also, FYI to PCP.  Virginia Crews, MD, MPH [facility] 11/28/2017 11:40 AM

## 2017-12-01 NOTE — Telephone Encounter (Signed)
Pt advised.

## 2017-12-02 ENCOUNTER — Ambulatory Visit (INDEPENDENT_AMBULATORY_CARE_PROVIDER_SITE_OTHER): Payer: PPO

## 2017-12-02 VITALS — BP 116/66 | HR 72 | Temp 99.3°F | Ht 74.0 in | Wt 169.4 lb

## 2017-12-02 DIAGNOSIS — Z0000 Encounter for general adult medical examination without abnormal findings: Secondary | ICD-10-CM | POA: Diagnosis not present

## 2017-12-02 NOTE — Patient Instructions (Addendum)
[name] , Thank you for taking time to come for your Medicare Wellness Visit. I appreciate your ongoing commitment to your health goals. Please review the following plan we discussed and let me know if I can assist you in the future.   Screening recommendations/referrals: Colonoscopy: Up to date Recommended yearly ophthalmology/optometry visit for glaucoma screening and checkup Recommended yearly dental visit for hygiene and checkup  Vaccinations: Influenza vaccine: Up to date Pneumococcal vaccine: Up to date Tdap vaccine: Up to date Shingles vaccine: Pt declines today.     Advanced directives: Already on file.   Conditions/risks identified: Recommend eating 3 small meals a day with 2 healthy snacks in between.  Next appointment: 12/10/17 @ 8:40 AM with Dr Rosanna Randy.  Preventive Care 50 Years and Older, Male Preventive care refers to lifestyle choices and visits with your health care provider that can promote health and wellness. What does preventive care include?  A yearly physical exam. This is also called an annual well check.  Dental exams once or twice a year.  Routine eye exams. Ask your health care provider how often you should have your eyes checked.  Personal lifestyle choices, including:  Daily care of your teeth and gums.  Regular physical activity.  Eating a healthy diet.  Avoiding tobacco and drug use.  Limiting alcohol use.  Practicing safe sex.  Taking low doses of aspirin every day.  Taking vitamin and mineral supplements as recommended by your health care provider. What happens during an annual well check? The services and screenings done by your health care provider during your annual well check will depend on your age, overall health, lifestyle risk factors, and family history of disease. Counseling  Your health care provider may ask you questions about your:  Alcohol use.  Tobacco use.  Drug use.  Emotional well-being.  Home and  relationship well-being.  Sexual activity.  Eating habits.  History of falls.  Memory and ability to understand (cognition).  Work and work Statistician. Screening  You may have the following tests or measurements:  Height, weight, and BMI.  Blood pressure.  Lipid and cholesterol levels. These may be checked every 5 years, or more frequently if you are over 41 years old.  Skin check.  Lung cancer screening. You may have this screening every year starting at age 62 if you have a 30-pack-year history of smoking and currently smoke or have quit within the past 15 years.  Fecal occult blood test (FOBT) of the stool. You may have this test every year starting at age 63.  Flexible sigmoidoscopy or colonoscopy. You may have a sigmoidoscopy every 5 years or a colonoscopy every 10 years starting at age 47.  Prostate cancer screening. Recommendations will vary depending on your family history and other risks.  Hepatitis C blood test.  Hepatitis B blood test.  Sexually transmitted disease (STD) testing.  Diabetes screening. This is done by checking your blood sugar (glucose) after you have not eaten for a while (fasting). You may have this done every 1-3 years.  Abdominal aortic aneurysm (AAA) screening. You may need this if you are a current or former smoker.  Osteoporosis. You may be screened starting at age 21 if you are at high risk. Talk with your health care provider about your test results, treatment options, and if necessary, the need for more tests. Vaccines  Your health care provider may recommend certain vaccines, such as:  Influenza vaccine. This is recommended every year.  Tetanus, diphtheria, and  acellular pertussis (Tdap, Td) vaccine. You may need a Td booster every 10 years.  Zoster vaccine. You may need this after age 39.  Pneumococcal 13-valent conjugate (PCV13) vaccine. One dose is recommended after age 19.  Pneumococcal polysaccharide (PPSV23) vaccine. One  dose is recommended after age 61. Talk to your health care provider about which screenings and vaccines you need and how often you need them. This information is not intended to replace advice given to you by your health care provider. Make sure you discuss any questions you have with your health care provider. Document Released: 06/30/2015 Document Revised: 02/21/2016 Document Reviewed: 04/04/2015 Elsevier Interactive Patient Education  2017 [place] Prevention in the Home Falls can cause injuries. They can happen to people of all ages. There are many things you can do to make your home safe and to help prevent falls. What can I do on the outside of my home?  Regularly fix the edges of walkways and driveways and fix any cracks.  Remove anything that might make you trip as you walk through a door, such as a raised step or threshold.  Trim any bushes or trees on the path to your home.  Use bright outdoor lighting.  Clear any walking paths of anything that might make someone trip, such as rocks or tools.  Regularly check to see if handrails are loose or broken. Make sure that both sides of any steps have handrails.  Any raised decks and porches should have guardrails on the edges.  Have any leaves, snow, or ice cleared regularly.  Use sand or salt on walking paths during winter.  Clean up any spills in your garage right away. This includes oil or grease spills. What can I do in the bathroom?  Use night lights.  Install grab bars by the toilet and in the tub and shower. Do not use towel bars as grab bars.  Use non-skid mats or decals in the tub or shower.  If you need to sit down in the shower, use a plastic, non-slip stool.  Keep the floor dry. Clean up any water that spills on the floor as soon as it happens.  Remove soap buildup in the tub or shower regularly.  Attach bath mats securely with double-sided non-slip rug tape.  Do not have throw rugs and other  things on the floor that can make you trip. What can I do in the bedroom?  Use night lights.  Make sure that you have a light by your bed that is easy to reach.  Do not use any sheets or blankets that are too big for your bed. They should not hang down onto the floor.  Have a firm chair that has side arms. You can use this for support while you get dressed.  Do not have throw rugs and other things on the floor that can make you trip. What can I do in the kitchen?  Clean up any spills right away.  Avoid walking on wet floors.  Keep items that you use a lot in easy-to-reach places.  If you need to reach something above you, use a strong step stool that has a grab bar.  Keep electrical cords out of the way.  Do not use floor polish or wax that makes floors slippery. If you must use wax, use non-skid floor wax.  Do not have throw rugs and other things on the floor that can make you trip. What can I do with my stairs?  Do not leave any items on the stairs.  Make sure that there are handrails on both sides of the stairs and use them. Fix handrails that are broken or loose. Make sure that handrails are as long as the stairways.  Check any carpeting to make sure that it is firmly attached to the stairs. Fix any carpet that is loose or worn.  Avoid having throw rugs at the top or bottom of the stairs. If you do have throw rugs, attach them to the floor with carpet tape.  Make sure that you have a light switch at the top of the stairs and the bottom of the stairs. If you do not have them, ask someone to add them for you. What else can I do to help prevent falls?  Wear shoes that:  Do not have high heels.  Have rubber bottoms.  Are comfortable and fit you well.  Are closed at the toe. Do not wear sandals.  If you use a stepladder:  Make sure that it is fully opened. Do not climb a closed stepladder.  Make sure that both sides of the stepladder are locked into place.  Ask  someone to hold it for you, if possible.  Clearly mark and make sure that you can see:  Any grab bars or handrails.  First and last steps.  Where the edge of each step is.  Use tools that help you move around (mobility aids) if they are needed. These include:  Canes.  Walkers.  Scooters.  Crutches.  Turn on the lights when you go into a dark area. Replace any light bulbs as soon as they burn out.  Set up your furniture so you have a clear path. Avoid moving your furniture around.  If any of your floors are uneven, fix them.  If there are any pets around you, be aware of where they are.  Review your medicines with your doctor. Some medicines can make you feel dizzy. This can increase your chance of falling. Ask your doctor what other things that you can do to help prevent falls. This information is not intended to replace advice given to you by your health care provider. Make sure you discuss any questions you have with your health care provider. Document Released: 03/30/2009 Document Revised: 11/09/2015 Document Reviewed: 07/08/2014 Elsevier Interactive Patient Education  2017 [employer].

## 2017-12-02 NOTE — Progress Notes (Signed)
Subjective:   [name] is a 77 y.o. male who presents for Medicare Annual/Subsequent preventive examination.  Review of Systems:  N/A  Cardiac Risk Factors include: advanced age (>30men, >4 women);dyslipidemia;hypertension;male gender     Objective:    Vitals: BP 116/66 (BP Location: Right Arm)   Pulse 72   Temp 99.3 F (37.4 C) (Oral)   Ht 6\' 2"  (1.88 m)   Wt 169 lb 6.4 oz (76.8 kg)   BMI 21.75 kg/m   Body mass index is 21.75 kg/m.  Advanced Directives 12/02/2017 08/13/2017 08/13/2017 04/12/2017 01/14/2017 12/31/2016 11/05/2016  Does Patient Have a Medical Advance Directive? Yes Yes Yes Yes Yes Yes Yes  Type of Paramedic of [place];Living will [place];Living will [place];Living will Living will;Healthcare Power of [place];Living will [place];Living will [place];Living will  Does patient want to make changes to medical advance directive? - No - Patient declined No - Patient declined - - - No - Patient declined  Copy of [place] in Chart? Yes No - copy requested No - copy requested - - No - copy requested Yes  Would patient like information on creating a medical advance directive? - - - No - Patient declined - - -    Tobacco Social History   Tobacco Use  Smoking Status Former Smoker  . Packs/day: 0.50  . Years: 3.00  . Pack years: 1.50  . Types: Cigarettes  . Last attempt to quit: 06/17/1958  . Years since quitting: 59.5  Smokeless Tobacco Never Used  Tobacco Comment   smoking while in college     Counseling given: Not Answered Comment: smoking while in college   Clinical Intake:  Pre-visit preparation completed: Yes  Pain : No/denies pain Pain Score: 0-No pain     Nutritional Status: BMI of 19-24  Normal Nutritional Risks: None Diabetes: No  How often do you need to have someone help you when you  read instructions, pamphlets, or other written materials from your doctor or pharmacy?: 1 - Never  Interpreter Needed?: No  Information entered by :: [facility], LPN  Past Medical History:  Diagnosis Date  . AB (asthmatic bronchitis)   . Allergic rhinitis   . Arthropathy   . Asthma   . Cellulitis   . DVT (deep venous thrombosis) ([place])   . Foreign body in skin   . Glaucoma   . Hx MRSA infection   . Hx MRSA infection   . Hypertension   . Insomnia   . Insomnia   . Mitral valve disorder   . Osteoarthrosis   . Upper back strain    Past Surgical History:  Procedure Laterality Date  . APPENDECTOMY    . BRONCHOSCOPY    . LEFT HEART CATH AND CORONARY ANGIOGRAPHY N/A 08/19/2016   Procedure: Left Heart Cath and Coronary Angiography;  Surgeon: Dionisio David, MD;  Location: [facility];  Service: Cardiovascular;  Laterality: N/A;  . NASAL SINUS SURGERY  09/27/2014   Family History  Problem Relation Age of Onset  . Breast cancer Sister   . Heart failure Mother   . Pancreatic cancer Sister   . Alzheimer's disease Father   . Brain cancer Daughter    Social History   Socioeconomic History  . Marital status: Married    Spouse name: Everlene Farrier  . Number of children: 2  . Years of education: College  .  Highest education level: Bachelor's degree (e.g., BA, AB, BS)  Occupational History  . Occupation: Retired    Fish farm manager: RETIRED    CommentBiomedical scientist  Social Needs  . Financial resource strain: Not hard at all  . Food insecurity:    Worry: Never true    Inability: Never true  . Transportation needs:    Medical: No    Non-medical: No  Tobacco Use  . Smoking status: Former Smoker    Packs/day: 0.50    Years: 3.00    Pack years: 1.50    Types: Cigarettes    Last attempt to quit: 06/17/1958    Years since quitting: 59.5  . Smokeless tobacco: Never Used  . Tobacco comment: smoking while in college  Substance and Sexual Activity  . Alcohol use: Yes    Alcohol/week: 3.0  oz    Types: 5 Glasses of wine per week  . Drug use: No  . Sexual activity: Yes  Lifestyle  . Physical activity:    Days per week: Not on file    Minutes per session: Not on file  . Stress: Not at all  Relationships  . Social connections:    Talks on phone: Not on file    Gets together: Not on file    Attends religious service: Not on file    Active member of club or organization: Not on file    Attends meetings of clubs or organizations: Not on file    Relationship status: Not on file  Other Topics Concern  . Not on file  Social History Narrative   He is a married father of 2 daughters.   He smoked casually while in college for about 3 years less than a pack a day.   He drinks 3-4 glass of wine at night. He also has about 2 servings of coffee a day   He is a very active gentleman with [employer] exercise program at least 3-4 days a week.   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   In addition to this standing his exercise, he does lots of gardening and uses a chain saw to cut wood.             Outpatient Encounter Medications as of 12/02/2017  Medication Sig  . ADVAIR HFA 230-21 MCG/ACT inhaler Inhale 2 puffs into the lungs 2 (two) times daily.   Marland Kitchen albuterol (PROVENTIL HFA;VENTOLIN HFA) 108 (90 Base) MCG/ACT inhaler Inhale into the lungs. 2 puffs twice daily  . atorvastatin (LIPITOR) 40 MG tablet Take 1 tablet (40 mg total) by mouth daily at 6 PM.  . brimonidine (ALPHAGAN) 0.15 % ophthalmic solution Place 1 drop into both eyes 2 (two) times daily.   Marland Kitchen diltiazem (CARDIZEM CD) 180 MG 24 hr capsule Take 1 capsule (180 mg total) by mouth daily.  . dorzolamide (TRUSOPT) 2 % ophthalmic solution Place 1 drop into both eyes 2 (two) times daily.   Marland Kitchen gabapentin (NEURONTIN) 400 MG capsule Take 400 mg by mouth at bedtime.   . isosorbide mononitrate (IMDUR) 30 MG 24 hr tablet Take 1 tablet (30 mg total) by mouth daily.  Marland Kitchen latanoprost (XALATAN) 0.005 %  ophthalmic solution Place 1 drop into both eyes at bedtime.   Marland Kitchen omeprazole (PRILOSEC) 20 MG capsule Take 1 capsule (20 mg total) by mouth daily. (Patient taking differently: Take 20 mg by mouth daily. )  . tiotropium (SPIRIVA HANDIHALER) 18 MCG inhalation capsule Place 18 mcg into inhaler  and inhale daily.   Alveda Reasons 10 MG TABS tablet Take 10 mg by mouth daily.   Marland Kitchen aspirin EC 81 MG EC tablet Take 1 tablet (81 mg total) by mouth daily.  . Calcium Carb-Cholecalciferol (CALCIUM 600 + D PO) Take 1 tablet by mouth daily.   . Multiple Vitamin (MULTIVITAMIN) tablet Take 1 tablet by mouth daily.  Marland Kitchen oseltamivir (TAMIFLU) 30 MG capsule Take 1 capsule (30 mg total) by mouth 2 (two) times daily. (Patient not taking: Reported on 12/02/2017)   No facility-administered encounter medications on file as of 12/02/2017.     Activities of Daily Living In your present state of health, do you have any difficulty performing the following activities: 12/02/2017 08/13/2017  Hearing? N N  Vision? N N  Difficulty concentrating or making decisions? N N  Walking or climbing stairs? N N  Dressing or bathing? N N  Doing errands, shopping? N N  Preparing Food and eating ? N -  Using the Toilet? N -  In the past six months, have you accidently leaked urine? N -  Do you have problems with loss of bowel control? N -  Managing your Medications? N -  Managing your Finances? N -  Housekeeping or managing your Housekeeping? N -  Some recent data might be hidden    Patient Care Team: Jerrol Banana., MD as PCP - General (Family Medicine) Leandrew Koyanagi, MD as Referring Physician (Ophthalmology) Robyne Askew, MD as Referring Physician (Internal Medicine) Dory Horn, MD as Referring Physician (Otolaryngology) Gardiner Barefoot, DPM as Consulting Physician (Podiatry) Ubaldo Glassing Javier Docker, MD as Consulting Physician (Cardiology)   Assessment:   This is a routine wellness examination for G[place].  Exercise  Activities and Dietary recommendations Current Exercise Habits: Structured exercise class, Type of exercise: treadmill;Other - see comments;walking(eliptical), Time (Minutes): 60, Frequency (Times/Week): 3, Weekly Exercise (Minutes/Week): 180, Intensity: Moderate, Exercise limited by: None identified  Goals    . DIET - REDUCE PORTION SIZE     Recommend eating 3 small meals a day with 2 healthy snacks in between.       Fall Risk Fall Risk  12/02/2017 01/14/2017 12/31/2016 01/02/2016 12/21/2014  Falls in the past year? No No No No No   Is the patient's home free of loose throw rugs in walkways, pet beds, electrical cords, etc?   yes      Grab bars in the bathroom? no      Handrails on the stairs?   yes      Adequate lighting?   yes  Timed Get Up and Go Performed: N/A  Depression Screen PHQ 2/9 Scores 12/02/2017 01/14/2017 12/31/2016 12/31/2016  PHQ - 2 Score 0 0 0 0  PHQ- 9 Score - - 0 -    Cognitive Function: Pt declined screening today.     6CIT Screen 12/31/2016  What Year? 0 points  What month? 0 points  What time? 0 points  Count back from 20 0 points  Months in reverse 0 points  Repeat phrase 4 points  Total Score 4    Immunization History  Administered Date(s) Administered  . Influenza Split 03/02/2012, 03/17/2013, 05/07/2014  . Influenza Whole 03/24/2007, 03/15/2009, 02/15/2010  . Influenza, High Dose Seasonal PF 05/04/2015, 03/21/2016, 03/24/2017  . Pneumococcal Conjugate-13 11/17/2013  . Pneumococcal Polysaccharide-23 11/19/2004, 03/19/2007, 03/17/2009  . Td 05/16/1997, 05/14/2007, 09/11/2010  . Zoster 01/02/2011    Qualifies for Shingles Vaccine? Due for Shingles vaccine. Declined my offer to administer today.  Education has been provided regarding the importance of this vaccine. Pt has been advised to call her insurance company to determine her out of pocket expense. Advised she may also receive this vaccine at her local pharmacy or Health Dept. Verbalized acceptance  and understanding.  Screening Tests Health Maintenance  Topic Date Due  . INFLUENZA VACCINE  01/15/2018  . TETANUS/TDAP  09/10/2020  . PNA vac Low Risk Adult  Completed   Cancer Screenings: Lung: Low Dose CT Chest recommended if Age 74-80 years, 30 pack-year currently smoking OR have quit w/in 15years. Patient does not qualify. Colorectal: Up to date  Additional Screenings:  Hepatitis C Screening: N/A      Plan:  I have personally reviewed and addressed the Medicare Annual Wellness questionnaire and have noted the following in the patient's chart:  A. Medical and social history B. Use of alcohol, tobacco or illicit drugs  C. Current medications and supplements D. Functional ability and status E.  Nutritional status F.  Physical activity G. Advance directives H. List of other physicians I.  Hospitalizations, surgeries, and ER visits in previous 12 months J.  [place] such as hearing and vision if needed, cognitive and depression L. Referrals and appointments - none  In addition, I have reviewed and discussed with patient certain preventive protocols, quality metrics, and best practice recommendations. A written personalized care plan for preventive services as well as general preventive health recommendations were provided to patient.  See attached scanned questionnaire for additional information.   Signed,  Fabio Neighbors, LPN Nurse Health Advisor   Nurse Recommendations: None.

## 2017-12-07 ENCOUNTER — Emergency Department: Payer: PPO

## 2017-12-07 ENCOUNTER — Emergency Department
Admission: EM | Admit: 2017-12-07 | Discharge: 2017-12-07 | Disposition: A | Discharge status: Home / Self Care | Payer: PPO | Attending: Emergency Medicine | Admitting: Emergency Medicine

## 2017-12-07 ENCOUNTER — Other Ambulatory Visit: Payer: Self-pay

## 2017-12-07 DIAGNOSIS — J45909 Unspecified asthma, uncomplicated: Secondary | ICD-10-CM | POA: Diagnosis not present

## 2017-12-07 DIAGNOSIS — R55 Syncope and collapse: Secondary | ICD-10-CM | POA: Insufficient documentation

## 2017-12-07 DIAGNOSIS — I10 Essential (primary) hypertension: Secondary | ICD-10-CM | POA: Insufficient documentation

## 2017-12-07 DIAGNOSIS — Z79899 Other long term (current) drug therapy: Secondary | ICD-10-CM | POA: Insufficient documentation

## 2017-12-07 DIAGNOSIS — R05 Cough: Secondary | ICD-10-CM | POA: Diagnosis not present

## 2017-12-07 DIAGNOSIS — Z87891 Personal history of nicotine dependence: Secondary | ICD-10-CM | POA: Diagnosis not present

## 2017-12-07 DIAGNOSIS — R079 Chest pain, unspecified: Secondary | ICD-10-CM | POA: Diagnosis not present

## 2017-12-07 LAB — CBC
HCT: 41 % (ref 40.0–52.0)
HEMOGLOBIN: 13.9 g/dL (ref 13.0–18.0)
MCH: 32.5 pg (ref 26.0–34.0)
MCHC: 33.9 g/dL (ref 32.0–36.0)
MCV: 95.9 fL (ref 80.0–100.0)
Platelets: 194 10*3/uL (ref 150–440)
RBC: 4.27 MIL/uL — ABNORMAL LOW (ref 4.40–5.90)
RDW: 13.6 % (ref 11.5–14.5)
WBC: 6.3 10*3/uL (ref 3.8–10.6)

## 2017-12-07 LAB — COMPREHENSIVE METABOLIC PANEL
ALBUMIN: 3 g/dL — AB (ref 3.5–5.0)
ALT: 15 U/L — ABNORMAL LOW (ref 17–63)
ANION GAP: 6 (ref 5–15)
AST: 19 U/L (ref 15–41)
Alkaline Phosphatase: 97 U/L (ref 38–126)
BUN: 9 mg/dL (ref 6–20)
CO2: 23 mmol/L (ref 22–32)
Calcium: 8 mg/dL — ABNORMAL LOW (ref 8.9–10.3)
Chloride: 104 mmol/L (ref 101–111)
Creatinine, Ser: 0.79 mg/dL (ref 0.61–1.24)
GFR calc Af Amer: >60 mL/min (ref >60)
GFR calc non Af Amer: >60 mL/min (ref >60)
GLUCOSE: 121 mg/dL — AB (ref 65–99)
Potassium: 3.5 mmol/L (ref 3.5–5.1)
SODIUM: 133 mmol/L — AB (ref 135–145)
Total Bilirubin: 0.6 mg/dL (ref 0.3–1.2)
Total Protein: 5.5 g/dL — ABNORMAL LOW (ref 6.5–8.1)

## 2017-12-07 LAB — TROPONIN I: Troponin I: <0.03 ng/mL (ref <0.03)

## 2017-12-07 MED ORDER — SODIUM CHLORIDE 0.9 % IV BOLUS
1000.0000 mL | Freq: Once | INTRAVENOUS | Status: AC
Start: 2017-12-07 — End: 2017-12-07
  Administered 2017-12-07: 1000 mL via INTRAVENOUS

## 2017-12-07 MED ORDER — SODIUM CHLORIDE 0.9 % IV SOLN
1000.0000 mL | Freq: Once | INTRAVENOUS | Status: AC
  Administered 2017-12-07: 1000 mL via INTRAVENOUS

## 2017-12-07 NOTE — ED Notes (Signed)
Dr Corky Downs notified of orthostatic vitals - VO for NACL bolus of 1065ml and then repeat orthostatics - pt notified of plan

## 2017-12-07 NOTE — ED Provider Notes (Signed)
[facility] Emergency Department Provider Note   ____________________________________________    I have reviewed the triage vital signs and the nursing notes.   HISTORY  Chief Complaint Loss of Consciousness     HPI [name] is a 77 y.o. male who presents after a reported syncopal episode.  Patient reports he was sitting and eating breakfast and he started to feel quite lightheaded.  He denies chest pain or palpitations.  He reports that apparently he passed out.  Denies injury.  States this happened before when getting dehydrated but overall is uncommon for him.  He is on blood thinners for DVT.  Has had a cough over the last 2 weeks but no fevers or chills.  No nausea or vomiting or abdominal pain.  Normal stools   Past Medical History:  Diagnosis Date  . AB (asthmatic bronchitis)   . Allergic rhinitis   . Arthropathy   . Asthma   . Cellulitis   . DVT (deep venous thrombosis) ([place])   . Foreign body in skin   . Glaucoma   . Hx MRSA infection   . Hx MRSA infection   . Hypertension   . Insomnia   . Insomnia   . Mitral valve disorder   . Osteoarthrosis   . Upper back strain     Patient Active Problem List   Diagnosis Date Noted  . SOB (shortness of breath) 08/13/2017  . Sepsis ([employer]) 11/05/2016  . Diverticulitis 11/05/2016  . History of deep vein thrombosis (DVT) of lower extremity 11/05/2016  . HCAP (healthcare-associated pneumonia) 08/29/2016  . NSTEMI (non-ST elevated myocardial infarction) ([place]) 08/18/2016  . Chronic obstructive pulmonary disease ([place]) 01/02/2016  . DVT, femoral, acute ([place]) 05/15/2015  . Recurrent pneumonia 12/21/2014  . Basal cell carcinoma 10/19/2014  . Abnormal liver enzymes 10/19/2014  . Personal history of methicillin resistant Staphylococcus aureus 10/19/2014  . Cannot sleep 10/19/2014  . Heel pain 10/19/2014  . Pain in shoulder 10/19/2014  . Back strain 10/19/2014  . Pulmonary aspergillosis, allergic  bronchopulmonary type ([place]) 07/30/2014  . ABPA (allergic bronchopulmonary aspergillosis) ([place]) 07/30/2014  . CAP (community acquired pneumonia) 05/03/2014  . Abnormal CXR 04/01/2014  . Paroxysmal tachycardia ([place]) 06/23/2013  . Asymptomatic PVCs 06/23/2013  . Beat, premature ventricular 06/23/2013  . Cough 10/19/2012  . Allergic rhinitis 01/17/2009  . Asthma, chronic obstructive, without status asthmaticus ([place]) 11/04/2008  . CAFL (chronic airflow limitation) ([place]) 11/04/2008  . Essential hypertension 02/09/2008  . Arthropathia 08/28/2007  . Disorder of mitral valve 07/08/2005  . Benign prostatic hypertrophy without urinary obstruction 05/16/1997  . LBP (low back pain) 05/16/1997  . Degenerative arthritis of hip 03/18/1995    Past Surgical History:  Procedure Laterality Date  . APPENDECTOMY    . BRONCHOSCOPY    . LEFT HEART CATH AND CORONARY ANGIOGRAPHY N/A 08/19/2016   Procedure: Left Heart Cath and Coronary Angiography;  Surgeon: Dionisio David, MD;  Location: [facility];  Service: Cardiovascular;  Laterality: N/A;  . NASAL SINUS SURGERY  09/27/2014    Prior to Admission medications   Medication Sig Start Date End Date Taking? Authorizing Provider  ADVAIR HFA 230-21 MCG/ACT inhaler Inhale 2 puffs into the lungs 2 (two) times daily.  11/17/15  Yes [provider]  albuterol (PROVENTIL HFA;VENTOLIN HFA) 108 (90 Base) MCG/ACT inhaler Inhale 2 puffs into the lungs 2 (two) times daily.  12/26/16 12/26/17 Yes [provider]  atorvastatin (LIPITOR) 40 MG tablet Take 1 tablet (  40 mg total) by mouth daily at 6 PM. 09/15/17  Yes Jerrol Banana., MD  brimonidine [facility]) 0.15 % ophthalmic solution Place 1 drop into both eyes 2 (two) times daily.  10/26/15  Yes [provider]  diltiazem (CARDIZEM CD) 180 MG 24 hr capsule Take 1 capsule (180 mg total) by mouth daily. 09/15/17  Yes Jerrol Banana., MD  dorzolamide (TRUSOPT) 2 % ophthalmic solution  Place 1 drop into both eyes 2 (two) times daily.  08/31/15  Yes [provider]  gabapentin (NEURONTIN) 400 MG capsule Take 400 mg by mouth at bedtime.  09/18/15  Yes [provider]  isosorbide mononitrate (IMDUR) 30 MG 24 hr tablet Take 1 tablet (30 mg total) by mouth daily. 09/15/17  Yes Jerrol Banana., MD  latanoprost (XALATAN) 0.005 % ophthalmic solution Place 1 drop into both eyes at bedtime.    Yes [provider]  omeprazole (PRILOSEC) 20 MG capsule Take 1 capsule (20 mg total) by mouth daily. Patient taking differently: Take 20 mg by mouth daily as needed (heart burn).  09/15/17  Yes Jerrol Banana., MD  tiotropium (SPIRIVA HANDIHALER) 18 MCG inhalation capsule Place 18 mcg into inhaler and inhale daily.  02/06/15 12/08/18 Yes [provider]  XARELTO 10 MG TABS tablet Take 10 mg by mouth daily.  08/05/16  Yes [provider]  aspirin EC 81 MG EC tablet Take 1 tablet (81 mg total) by mouth daily. Patient not taking: Reported on 12/07/2017 08/21/16   Loletha Grayer, MD     Allergies Timolol maleate [timolol maleate]; Bactrim [sulfamethoxazole-trimethoprim]; Budesonide-formoterol fumarate; and Lisinopril  Family History  Problem Relation Age of Onset  . Breast cancer Sister   . Heart failure Mother   . Pancreatic cancer Sister   . Alzheimer's disease Father   . Brain cancer Daughter     Social History Social History   Tobacco Use  . Smoking status: Former Smoker    Packs/day: 0.50    Years: 3.00    Pack years: 1.50    Types: Cigarettes    Last attempt to quit: 06/17/1958    Years since quitting: 59.5  . Smokeless tobacco: Never Used  . Tobacco comment: smoking while in college  Substance Use Topics  . Alcohol use: Yes    Alcohol/week: 3.0 oz    Types: 5 Glasses of wine per week  . Drug use: No    Review of Systems  Constitutional: No fever/chills Eyes: No visual changes.  ENT: No neck pain Cardiovascular: Denies  chest pain. Respiratory: Denies shortness of breath. Gastrointestinal: No abdominal pain.   Genitourinary: Negative for dysuria. Musculoskeletal: Negative for back pain. Skin: Negative for rash. Neurological: Negative for headaches   ____________________________________________   PHYSICAL EXAM:  VITAL SIGNS: ED Triage Vitals  Enc Vitals Group     BP 12/07/17 0900 133/81     Pulse Rate 12/07/17 0905 62     Resp 12/07/17 0905 (!) 32     Temp 12/07/17 0905 98.2 F (36.8 C)     Temp Source 12/07/17 0905 Oral     SpO2 12/07/17 0905 95 %     Weight 12/07/17 0905 77.1 kg (170 lb)     Height 12/07/17 0905 1.88 m (6\' 2" )     Head Circumference --      Peak Flow --      Pain Score 12/07/17 0905 0     Pain Loc --  Pain Edu? --      Excl. in [place]? --     Constitutional: Alert and oriented. No acute distress. Pleasant and interactive Eyes: Conjunctivae are normal.   Nose: No congestion/rhinnorhea. Mouth/Throat: Mucous membranes are moist.    Cardiovascular: Normal rate, regular rhythm. Grossly normal heart sounds.  Good peripheral circulation. Respiratory: Normal respiratory effort.  No retractions. Lungs CTAB. Gastrointestinal: Soft and nontender. No distention.  No CVA tenderness.  Musculoskeletal: No lower extremity tenderness nor edema.  Warm and well perfused Neurologic:  Normal speech and language. No gross focal neurologic deficits are appreciated.  Skin:  Skin is warm, dry and intact. No rash noted. Psychiatric: Mood and affect are normal. Speech and behavior are normal.  ____________________________________________   LABS (all labs ordered are listed, but only abnormal results are displayed)  Labs Reviewed  CBC - Abnormal; Notable for the following components:      Result Value   RBC 4.27 (*)    All other components within normal limits  COMPREHENSIVE METABOLIC PANEL - Abnormal; Notable for the following components:   Sodium 133 (*)    Glucose, Bld 121 (*)      Calcium 8.0 (*)    Total Protein 5.5 (*)    Albumin 3.0 (*)    ALT 15 (*)    All other components within normal limits  TROPONIN I  URINALYSIS, COMPLETE (UACMP) WITH MICROSCOPIC   ____________________________________________  EKG  ED ECG REPORT I, Lavonia Drafts, the attending physician, personally viewed and interpreted this ECG.  Date: 12/07/2017  Rhythm: normal sinus rhythm QRS Axis: normal Intervals: Left anterior fascicular block ST/T Wave abnormalities: normal Narrative Interpretation: no evidence of acute ischemia  ____________________________________________  RADIOLOGY  X-ray no pneumonia ____________________________________________   PROCEDURES  Procedure(s) performed: No  Procedures   Critical Care performed:No ____________________________________________   INITIAL IMPRESSION / ASSESSMENT AND PLAN / ED COURSE  Pertinent labs & imaging results that were available during my care of the patient were reviewed by me and considered in my medical decision making (see chart for details).  Patient presents after a syncopal episode.  Overall he is well-appearing in no acute distress.  Vital signs are significant only for a mild tachypnea although seems to have improved upon my exam.  Will check chest x-ray, labs including cardiac enzymes give IV fluids and placed on a cardiac monitor.  ----------------------------------------- 12:25 PM on 12/07/2017 -----------------------------------------   labs unremarkable, patient feels quite well however orthostatics positive when he stood up however asymptomatic.  He does not want to stay in the hospital.  I will give an additional liter of fluid and reevaluate.  After additional liter fluid, patient orthostatics repeated, improved.  Patient is and patient does not want to stay any longer, at this point will discharge him with strict return precautions    ____________________________________________   FINAL  CLINICAL IMPRESSION(S) / ED DIAGNOSES  Final diagnoses:  Syncope, unspecified syncope type        Note:  This document was prepared using Dragon voice recognition software and may include unintentional dictation errors.    Lavonia Drafts, MD 12/07/17 1349

## 2017-12-07 NOTE — ED Notes (Signed)
Pt given urinal to void

## 2017-12-07 NOTE — ED Triage Notes (Signed)
Pt arrived via ems for report of syncopal episode this am - pt was orthostatic on scene laying systolic 141 - sitting systolic 030 - standing unable to obtain BP and pt began to vomit - at this time pt denies any pain or discomfort - denies shortness of breath or chest pain - does report cough x2 weeks and weakness x2-3 days

## 2017-12-10 ENCOUNTER — Encounter: Payer: Self-pay | Admitting: Family Medicine

## 2017-12-10 ENCOUNTER — Ambulatory Visit (INDEPENDENT_AMBULATORY_CARE_PROVIDER_SITE_OTHER): Payer: PPO | Admitting: Family Medicine

## 2017-12-10 VITALS — BP 102/60 | HR 72 | Temp 98.4°F | Resp 16 | Ht 74.0 in | Wt 166.0 lb

## 2017-12-10 DIAGNOSIS — E785 Hyperlipidemia, unspecified: Secondary | ICD-10-CM | POA: Diagnosis not present

## 2017-12-10 DIAGNOSIS — R5383 Other fatigue: Secondary | ICD-10-CM | POA: Diagnosis not present

## 2017-12-10 DIAGNOSIS — N400 Enlarged prostate without lower urinary tract symptoms: Secondary | ICD-10-CM | POA: Diagnosis not present

## 2017-12-10 DIAGNOSIS — Z0000 Encounter for general adult medical examination without abnormal findings: Secondary | ICD-10-CM

## 2017-12-10 DIAGNOSIS — Z0001 Encounter for general adult medical examination with abnormal findings: Secondary | ICD-10-CM | POA: Diagnosis not present

## 2017-12-10 LAB — POCT URINALYSIS DIPSTICK
BILIRUBIN UA: NEGATIVE
GLUCOSE UA: NEGATIVE
Ketones, UA: NEGATIVE
NITRITE UA: NEGATIVE
Protein, UA: POSITIVE — AB
Spec Grav, UA: 1.02 (ref 1.010–1.025)
Urobilinogen, UA: 0.2 E.U./dL
pH, UA: 7 (ref 5.0–8.0)

## 2017-12-10 NOTE — Progress Notes (Signed)
Patient: [name], Male    DOB: 12/02/1940, 77 y.o.   MRN: [id number] Visit Date: 12/10/2017  Today's Provider: Wilhemena Durie, MD   Chief Complaint  Patient presents with  . Annual Wellness Exam   Subjective:    Annual wellness visit [name] is a 77 y.o. male. He feels well. He reports exercising not regularly. He reports he is sleeping poorly. He has to get up 2-3 times at night to urinate.   Colonoscopy- 02/13/2006. Diverticulosis.  Tdap- 09/11/2010  He also mentions that he was seen in the ER on 12/07/17 due to passing out. He was sent home the following day. He reports that he was given IV fluids, and no other changes were made in his medications. He reports that he is still feeling weak and fatigued. He has increased his fluid intake, but has not noticed any difference in his symptoms.   Review of Systems  Constitutional: Positive for activity change and fatigue. Negative for appetite change, chills, diaphoresis, fever and unexpected weight change.  HENT: Negative.   Respiratory: Negative for cough, shortness of breath and wheezing.   Cardiovascular: Negative for chest pain, palpitations and leg swelling.  Genitourinary: Positive for frequency and urgency. Negative for discharge, dysuria, flank pain, hematuria, penile pain, penile swelling, scrotal swelling and testicular pain.       Nocturia times 3-4.  Allergic/Immunologic: Negative.   Neurological: Positive for weakness and light-headedness. Negative for dizziness, tremors and syncope.  Psychiatric/Behavioral: Positive for sleep disturbance.    Social History   Socioeconomic History  . Marital status: Married    Spouse name: [name]  . Number of children: 2  . Years of education: College  . Highest education level: Bachelor's degree (e.g., BA, AB, BS)  Occupational History  . Occupation: Retired    Fish farm manager: RETIRED    CommentBiomedical scientist  Social Needs  . Financial resource strain: Not hard at  all  . Food insecurity:    Worry: Never true    Inability: Never true  . Transportation needs:    Medical: No    Non-medical: No  Tobacco Use  . Smoking status: Former Smoker    Packs/day: 0.50    Years: 3.00    Pack years: 1.50    Types: Cigarettes    Last attempt to quit: 06/17/1958    Years since quitting: 59.5  . Smokeless tobacco: Never Used  . Tobacco comment: smoking while in college  Substance and Sexual Activity  . Alcohol use: Yes    Alcohol/week: 3.0 oz    Types: 5 Glasses of wine per week  . Drug use: No  . Sexual activity: Yes  Lifestyle  . Physical activity:    Days per week: Not on file    Minutes per session: Not on file  . Stress: Not at all  Relationships  . Social connections:    Talks on phone: Not on file    Gets together: Not on file    Attends religious service: Not on file    Active member of club or organization: Not on file    Attends meetings of clubs or organizations: Not on file    Relationship status: Not on file  . Intimate partner violence:    Fear of current or ex partner: Not on file    Emotionally abused: Not on file    Physically abused: Not on file    Forced sexual activity: Not on file  Other  Topics Concern  . Not on file  Social History Narrative   He is a married father of 2 daughters.   He smoked casually while in college for about 3 years less than a pack a day.   He drinks 3-4 glass of wine at night. He also has about 2 servings of coffee a day   He is a very active gentleman with [employer] exercise program at least 3-4 days a week.   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   In addition to this standing his exercise, he does lots of gardening and uses a chain saw to cut wood.             Past Medical History:  Diagnosis Date  . AB (asthmatic bronchitis)   . Allergic rhinitis   . Arthropathy   . Asthma   . Cellulitis   . DVT (deep venous thrombosis) ([place])   . Foreign body in skin     . Glaucoma   . Hx MRSA infection   . Hx MRSA infection   . Hypertension   . Insomnia   . Insomnia   . Mitral valve disorder   . Osteoarthrosis   . Upper back strain      Patient Active Problem List   Diagnosis Date Noted  . SOB (shortness of breath) 08/13/2017  . Sepsis ([place]) 11/05/2016  . Diverticulitis 11/05/2016  . History of deep vein thrombosis (DVT) of lower extremity 11/05/2016  . HCAP (healthcare-associated pneumonia) 08/29/2016  . NSTEMI (non-ST elevated myocardial infarction) ([place]) 08/18/2016  . Chronic obstructive pulmonary disease ([place]) 01/02/2016  . DVT, femoral, acute ([place]) 05/15/2015  . Recurrent pneumonia 12/21/2014  . Basal cell carcinoma 10/19/2014  . Abnormal liver enzymes 10/19/2014  . Personal history of methicillin resistant Staphylococcus aureus 10/19/2014  . Cannot sleep 10/19/2014  . Heel pain 10/19/2014  . Pain in shoulder 10/19/2014  . Back strain 10/19/2014  . Pulmonary aspergillosis, allergic bronchopulmonary type ([place]) 07/30/2014  . ABPA (allergic bronchopulmonary aspergillosis) ([place]) 07/30/2014  . CAP (community acquired pneumonia) 05/03/2014  . Abnormal CXR 04/01/2014  . Paroxysmal tachycardia ([place]) 06/23/2013  . Asymptomatic PVCs 06/23/2013  . Beat, premature ventricular 06/23/2013  . Cough 10/19/2012  . Allergic rhinitis 01/17/2009  . Asthma, chronic obstructive, without status asthmaticus ([place]) 11/04/2008  . CAFL (chronic airflow limitation) ([place]) 11/04/2008  . Essential hypertension 02/09/2008  . Arthropathia 08/28/2007  . Disorder of mitral valve 07/08/2005  . Benign prostatic hypertrophy without urinary obstruction 05/16/1997  . LBP (low back pain) 05/16/1997  . Degenerative arthritis of hip 03/18/1995    Past Surgical History:  Procedure Laterality Date  . APPENDECTOMY    . BRONCHOSCOPY    . LEFT HEART CATH AND CORONARY ANGIOGRAPHY N/A 08/19/2016   Procedure: Left Heart Cath and Coronary Angiography;  Surgeon: Dionisio David,  MD;  Location: [facility];  Service: Cardiovascular;  Laterality: N/A;  . NASAL SINUS SURGERY  09/27/2014    His family history includes Alzheimer's disease in his father; Brain cancer in his daughter; Breast cancer in his sister; Heart failure in his mother; Pancreatic cancer in his sister.      Current Outpatient Medications:  .  ADVAIR HFA 154-00 MCG/ACT inhaler, Inhale 2 puffs into the lungs 2 (two) times daily. , Disp: , Rfl:  .  albuterol (PROVENTIL HFA;VENTOLIN HFA) 108 (90 Base) MCG/ACT inhaler, Inhale 2 puffs into the lungs 2 (two) times daily. , Disp: , Rfl:  .  atorvastatin (LIPITOR) 40 MG tablet, Take 1 tablet (40 mg total) by mouth daily at 6 PM., Disp: 90 tablet, Rfl: 3 .  brimonidine (ALPHAGAN) 0.15 % ophthalmic solution, Place 1 drop into both eyes 2 (two) times daily. , Disp: , Rfl:  .  diltiazem (CARDIZEM CD) 180 MG 24 hr capsule, Take 1 capsule (180 mg total) by mouth daily., Disp: 90 capsule, Rfl: 3 .  dorzolamide (TRUSOPT) 2 % ophthalmic solution, Place 1 drop into both eyes 2 (two) times daily. , Disp: , Rfl:  .  gabapentin (NEURONTIN) 400 MG capsule, Take 400 mg by mouth at bedtime. , Disp: , Rfl:  .  isosorbide mononitrate (IMDUR) 30 MG 24 hr tablet, Take 1 tablet (30 mg total) by mouth daily., Disp: 90 tablet, Rfl: 3 .  latanoprost (XALATAN) 0.005 % ophthalmic solution, Place 1 drop into both eyes at bedtime. , Disp: , Rfl:  .  omeprazole (PRILOSEC) 20 MG capsule, Take 1 capsule (20 mg total) by mouth daily. (Patient taking differently: Take 20 mg by mouth daily as needed (heart burn). ), Disp: 90 capsule, Rfl: 3 .  tiotropium (SPIRIVA HANDIHALER) 18 MCG inhalation capsule, Place 18 mcg into inhaler and inhale daily. , Disp: , Rfl:  .  XARELTO 10 MG TABS tablet, Take 10 mg by mouth daily. , Disp: , Rfl:  .  aspirin EC 81 MG EC tablet, Take 1 tablet (81 mg total) by mouth daily. (Patient not taking: Reported on 12/07/2017), Disp: 30 tablet, Rfl: 0  Patient Care  Team: Jerrol Banana., MD as PCP - General (Family Medicine) Leandrew Koyanagi, MD as Referring Physician (Ophthalmology) Robyne Askew, MD as Referring Physician (Internal Medicine) Dory Horn, MD as Referring Physician (Otolaryngology) Gardiner Barefoot, DPM as Consulting Physician (Podiatry) Ubaldo Glassing Javier Docker, MD as Consulting Physician (Cardiology)     Objective:   Vitals: BP 102/60 (BP Location: Right Arm, Patient Position: Sitting, Cuff Size: Normal)   Pulse 72   Temp 98.4 F (36.9 C)   Resp 16   Ht 6\' 2"  (1.88 m)   Wt 166 lb (75.3 kg)   SpO2 93%   BMI 21.31 kg/m   Physical Exam  Constitutional: He is oriented to person, place, and time. He appears well-developed and well-nourished.  HENT:  Head: Normocephalic and atraumatic.  Right Ear: External ear normal.  Left Ear: External ear normal.  Nose: Nose normal.  Mouth/Throat: Oropharynx is clear and moist.  Eyes: Conjunctivae are normal. No scleral icterus.  Neck: No thyromegaly present.  Cardiovascular: Normal rate, regular rhythm and normal heart sounds.  Pulmonary/Chest: Effort normal and breath sounds normal.  Abdominal: Soft.  Musculoskeletal: He exhibits no edema.  Neurological: He is alert and oriented to person, place, and time.  Skin: Skin is warm and dry.  Psychiatric: He has a normal mood and affect. His behavior is normal. Judgment and thought content normal.    Activities of Daily Living In your present state of health, do you have any difficulty performing the following activities: 12/02/2017 08/13/2017  Hearing? N N  Vision? N N  Difficulty concentrating or making decisions? N N  Walking or climbing stairs? N N  Dressing or bathing? N N  Doing errands, shopping? N N  Preparing Food and eating ? N -  Using the Toilet? N -  In the past six months, have you accidently leaked urine? N -  Do you have problems with loss of bowel control? N -  Managing your Medications?  N -  Managing your  Finances? N -  Housekeeping or managing your Housekeeping? N -  Some recent data might be hidden    Fall Risk Assessment Fall Risk  12/02/2017 01/14/2017 12/31/2016 01/02/2016 12/21/2014  Falls in the past year? No No No No No     Depression Screen PHQ 2/9 Scores 12/02/2017 01/14/2017 12/31/2016 12/31/2016  PHQ - 2 Score 0 0 0 0  PHQ- 9 Score - - 0 -    Cognitive Testing - 6-CIT Cognitive Function: Pt declined screening on 12/02/17.  6CIT Screen 12/31/2016  What Year? 0 points  What month? 0 points  What time? 0 points  Count back from 20 0 points  Months in reverse 0 points  Repeat phrase 4 points  Total Score 4       Assessment & Plan:     Annual Wellness Visit  Reviewed patient's Family Medical History Reviewed and updated list of patient's medical providers Assessment of cognitive impairment was done Assessed patient's functional ability Established a written schedule for health screening [place] Completed and Reviewed  Exercise Activities and Dietary recommendations Goals    . DIET - REDUCE PORTION SIZE     Recommend eating 3 small meals a day with 2 healthy snacks in between.       Immunization History  Administered Date(s) Administered  . Influenza Split 03/02/2012, 03/17/2013, 05/07/2014  . Influenza Whole 03/24/2007, 03/15/2009, 02/15/2010  . Influenza, High Dose Seasonal PF 05/04/2015, 03/21/2016, 03/24/2017  . Pneumococcal Conjugate-13 11/17/2013  . Pneumococcal Polysaccharide-23 11/19/2004, 03/19/2007, 03/17/2009  . Td 05/16/1997, 05/14/2007, 09/11/2010  . Zoster 01/02/2011    Health Maintenance  Topic Date Due  . INFLUENZA VACCINE  01/15/2018  . TETANUS/TDAP  09/10/2020  . PNA vac Low Risk Adult  Completed     Discussed health benefits of physical activity, and encouraged him to engage in regular exercise appropriate for his age and condition.  BPH Consider Tamulosin. Fatigue HLD Prostate Cancer H/o DVT Obtain labs.     I have done the exam and reviewed the above chart and it is accurate to the best of my knowledge. Development worker, community has been used in this note in any air is in the dictation or transcription are unintentional.  Wilhemena Durie, MD  [place] [facility]

## 2017-12-11 LAB — LIPID PANEL
CHOL/HDL RATIO: 2 ratio (ref 0.0–5.0)
Cholesterol, Total: 86 mg/dL — ABNORMAL LOW (ref 100–199)
HDL: 42 mg/dL (ref >39)
LDL Calculated: 35 mg/dL (ref 0–99)
Triglycerides: 45 mg/dL (ref 0–149)
VLDL CHOLESTEROL CAL: 9 mg/dL (ref 5–40)

## 2017-12-11 LAB — TSH: TSH: 2.22 u[IU]/mL (ref 0.450–4.500)

## 2017-12-11 LAB — PSA: Prostate Specific Ag, Serum: 7.6 ng/mL — ABNORMAL HIGH (ref 0.0–4.0)

## 2017-12-12 DIAGNOSIS — L570 Actinic keratosis: Secondary | ICD-10-CM | POA: Diagnosis not present

## 2017-12-12 DIAGNOSIS — L821 Other seborrheic keratosis: Secondary | ICD-10-CM | POA: Diagnosis not present

## 2017-12-17 ENCOUNTER — Telehealth: Payer: Self-pay

## 2017-12-17 MED ORDER — LEVOFLOXACIN 500 MG PO TABS: 500.0000 mg | ORAL_TABLET | Freq: Every day | ORAL | 0 refills | Status: DC

## 2017-12-17 NOTE — Telephone Encounter (Signed)
Advised patient of results. Medication was sent into the pharmacy.  

## 2017-12-17 NOTE — Telephone Encounter (Signed)
-----   Message from Jerrol Banana., MD sent at 12/17/2017  8:07 AM EDT ----- PSA elevated--Rx Levaquin 500mg  daily for 2 weeks.

## 2017-12-29 ENCOUNTER — Ambulatory Visit: Payer: PPO | Admitting: Podiatry

## 2017-12-31 ENCOUNTER — Ambulatory Visit: Payer: PPO | Admitting: Podiatry

## 2017-12-31 ENCOUNTER — Encounter: Payer: Self-pay | Admitting: Podiatry

## 2017-12-31 DIAGNOSIS — M7751 Other enthesopathy of right foot: Secondary | ICD-10-CM

## 2017-12-31 DIAGNOSIS — M779 Enthesopathy, unspecified: Principal | ICD-10-CM

## 2017-12-31 DIAGNOSIS — M778 Other enthesopathies, not elsewhere classified: Secondary | ICD-10-CM

## 2017-12-31 NOTE — Progress Notes (Signed)
He presents today stating that I like to have another set of shots and getting ready to go on trip to [place] [employer] [employer] (consulting).  Objective: Vital signs stable alert and oriented x3 severe pain on palpation of Lisfranc's joints right foot.  Assessment: Lisfranc's arthritis and capsulitis.  Plan: Injected 2 injections today after sterile Betadine skin prep 20 mg Kenalog 5 mg Marcaine to the point of maximal tenderness.  Tolerated procedure well follow-up with him in 6 months

## 2018-01-01 ENCOUNTER — Ambulatory Visit
Admission: RE | Admit: 2018-01-01 | Discharge: 2018-01-01 | Disposition: A | Discharge status: Home / Self Care | Payer: PPO | Source: Ambulatory Visit | Attending: Family Medicine | Admitting: Family Medicine

## 2018-01-01 ENCOUNTER — Ambulatory Visit (INDEPENDENT_AMBULATORY_CARE_PROVIDER_SITE_OTHER): Payer: PPO | Admitting: Family Medicine

## 2018-01-01 VITALS — BP 130/68 | HR 88 | Temp 98.1°F | Resp 16 | Wt 164.0 lb

## 2018-01-01 DIAGNOSIS — I509 Heart failure, unspecified: Secondary | ICD-10-CM

## 2018-01-01 DIAGNOSIS — Z8614 Personal history of Methicillin resistant Staphylococcus aureus infection: Secondary | ICD-10-CM

## 2018-01-01 DIAGNOSIS — J984 Other disorders of lung: Secondary | ICD-10-CM | POA: Diagnosis not present

## 2018-01-01 DIAGNOSIS — R531 Weakness: Secondary | ICD-10-CM

## 2018-01-01 DIAGNOSIS — M791 Myalgia, unspecified site: Secondary | ICD-10-CM

## 2018-01-01 DIAGNOSIS — N400 Enlarged prostate without lower urinary tract symptoms: Secondary | ICD-10-CM | POA: Diagnosis not present

## 2018-01-01 DIAGNOSIS — J189 Pneumonia, unspecified organism: Secondary | ICD-10-CM | POA: Diagnosis not present

## 2018-01-01 NOTE — Patient Instructions (Signed)
Discontinue Gabapentin.  Try support hose daily.

## 2018-01-01 NOTE — Progress Notes (Signed)
[name]  MRN: [id number] DOB: 1940-11-25  Subjective:  HPI  The patient is a 77 year old male who presents today for evaluation of pedal edema that has been present for about 1 month.  He denies any shortness of breath or chest pain.  He states he does no notice any decrease in the swelling after elevating his feet.  Patient states that when he was in last time he was complaining of nocturia, every 2 hours.  He was treated with Levaquin but states he is still having problems.    He also states that the for the last month he has had increased weakness and soreness in all muscles.  He does note that this is starting to improve.    Patient Active Problem List   Diagnosis Date Noted  . SOB (shortness of breath) 08/13/2017  . Sepsis ([place]) 11/05/2016  . Diverticulitis 11/05/2016  . History of deep vein thrombosis (DVT) of lower extremity 11/05/2016  . HCAP (healthcare-associated pneumonia) 08/29/2016  . NSTEMI (non-ST elevated myocardial infarction) ([place]) 08/18/2016  . Chronic obstructive pulmonary disease ([place]) 01/02/2016  . DVT, femoral, acute ([place]) 05/15/2015  . Recurrent pneumonia 12/21/2014  . Basal cell carcinoma 10/19/2014  . Abnormal liver enzymes 10/19/2014  . Personal history of methicillin resistant Staphylococcus aureus 10/19/2014  . Cannot sleep 10/19/2014  . Heel pain 10/19/2014  . Pain in shoulder 10/19/2014  . Back strain 10/19/2014  . Pulmonary aspergillosis, allergic bronchopulmonary type ([place]) 07/30/2014  . ABPA (allergic bronchopulmonary aspergillosis) ([place]) 07/30/2014  . CAP (community acquired pneumonia) 05/03/2014  . Abnormal CXR 04/01/2014  . Paroxysmal tachycardia ([place]) 06/23/2013  . Asymptomatic PVCs 06/23/2013  . Beat, premature ventricular 06/23/2013  . Cough 10/19/2012  . Allergic rhinitis 01/17/2009  . Asthma, chronic obstructive, without status asthmaticus ([place]) 11/04/2008  . CAFL (chronic airflow limitation) ([place]) 11/04/2008  . Essential  hypertension 02/09/2008  . Arthropathia 08/28/2007  . Disorder of mitral valve 07/08/2005  . Benign prostatic hypertrophy without urinary obstruction 05/16/1997  . LBP (low back pain) 05/16/1997  . Degenerative arthritis of hip 03/18/1995    Past Medical History:  Diagnosis Date  . AB (asthmatic bronchitis)   . Allergic rhinitis   . Arthropathy   . Asthma   . Cellulitis   . DVT (deep venous thrombosis) ([place])   . Foreign body in skin   . Glaucoma   . Hx MRSA infection   . Hx MRSA infection   . Hypertension   . Insomnia   . Insomnia   . Mitral valve disorder   . Osteoarthrosis   . Upper back strain     Social History   Socioeconomic History  . Marital status: Married    Spouse name: Everlene Farrier  . Number of children: 2  . Years of education: College  . Highest education level: Bachelor's degree (e.g., BA, AB, BS)  Occupational History  . Occupation: Retired    Fish farm manager: RETIRED    CommentBiomedical scientist  Social Needs  . Financial resource strain: Not hard at all  . Food insecurity:    Worry: Never true    Inability: Never true  . Transportation needs:    Medical: No    Non-medical: No  Tobacco Use  . Smoking status: Former Smoker    Packs/day: 0.50    Years: 3.00    Pack years: 1.50    Types: Cigarettes    Last attempt to quit: 06/17/1958    Years since quitting: 59.5  . Smokeless tobacco:  Never Used  . Tobacco comment: smoking while in college  Substance and Sexual Activity  . Alcohol use: Yes    Alcohol/week: 3.0 oz    Types: 5 Glasses of wine per week  . Drug use: No  . Sexual activity: Yes  Lifestyle  . Physical activity:    Days per week: Not on file    Minutes per session: Not on file  . Stress: Not at all  Relationships  . Social connections:    Talks on phone: Not on file    Gets together: Not on file    Attends religious service: Not on file    Active member of club or organization: Not on file    Attends meetings of clubs or organizations: Not  on file    Relationship status: Not on file  . Intimate partner violence:    Fear of current or ex partner: Not on file    Emotionally abused: Not on file    Physically abused: Not on file    Forced sexual activity: Not on file  Other Topics Concern  . Not on file  Social History Narrative   He is a married father of 2 daughters.   He smoked casually while in college for about 3 years less than a pack a day.   He drinks 3-4 glass of wine at night. He also has about 2 servings of coffee a day   He is a very active gentleman with [employer] exercise program at least 3-4 days a week.   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   In addition to this standing his exercise, he does lots of gardening and uses a chain saw to cut wood.             Outpatient Encounter Medications as of 01/01/2018  Medication Sig  . ADVAIR HFA 230-21 MCG/ACT inhaler Inhale 2 puffs into the lungs 2 (two) times daily.   Marland Kitchen albuterol (PROVENTIL HFA;VENTOLIN HFA) 108 (90 Base) MCG/ACT inhaler Inhale into the lungs every 6 (six) hours as needed for wheezing or shortness of breath.  Marland Kitchen atorvastatin (LIPITOR) 40 MG tablet Take 1 tablet (40 mg total) by mouth daily at 6 PM.  . brimonidine (ALPHAGAN) 0.15 % ophthalmic solution Place 1 drop into both eyes 2 (two) times daily.   Marland Kitchen diltiazem (CARDIZEM CD) 180 MG 24 hr capsule Take 1 capsule (180 mg total) by mouth daily.  . dorzolamide (TRUSOPT) 2 % ophthalmic solution Place 1 drop into both eyes 2 (two) times daily.   Marland Kitchen gabapentin (NEURONTIN) 400 MG capsule Take 400 mg by mouth at bedtime.   . isosorbide mononitrate (IMDUR) 30 MG 24 hr tablet Take 1 tablet (30 mg total) by mouth daily.  Marland Kitchen latanoprost (XALATAN) 0.005 % ophthalmic solution Place 1 drop into both eyes at bedtime.   Marland Kitchen tiotropium (SPIRIVA HANDIHALER) 18 MCG inhalation capsule Place 18 mcg into inhaler and inhale daily.   Alveda Reasons 10 MG TABS tablet Take 10 mg by mouth daily.   .  [DISCONTINUED] aspirin EC 81 MG EC tablet Take 1 tablet (81 mg total) by mouth daily. (Patient not taking: Reported on 12/07/2017)  . [DISCONTINUED] levofloxacin (LEVAQUIN) 500 MG tablet Take 1 tablet (500 mg total) by mouth daily.  . [DISCONTINUED] omeprazole (PRILOSEC) 20 MG capsule Take 1 capsule (20 mg total) by mouth daily. (Patient taking differently: Take 20 mg by mouth daily as needed (heart burn). )   No  facility-administered encounter medications on file as of 01/01/2018.     Allergies  Allergen Reactions  . Timolol Maleate [Timolol Maleate] Shortness Of Breath  . Bactrim [Sulfamethoxazole-Trimethoprim]     Pt says "it takes his skin pigment off  . Budesonide-Formoterol Fumarate Other (See Comments)    SYMBICORT Patient reports dizziness and trembling/shaky feeling.  . Lisinopril Other (See Comments)    cough Cough    Review of Systems  Constitutional: Positive for malaise/fatigue. Negative for fever.  Respiratory: Negative for cough, shortness of breath and wheezing.   Cardiovascular: Positive for leg swelling. Negative for chest pain, palpitations, orthopnea and claudication.  Gastrointestinal: Negative.   Neurological: Negative.   Endo/Heme/Allergies: Negative.   Psychiatric/Behavioral: Negative.     Objective:  BP 130/68 (BP Location: Right Arm, Patient Position: Sitting, Cuff Size: Normal)   Pulse 88   Temp 98.1 F (36.7 C) (Oral)   Resp 16   Wt 164 lb (74.4 kg)   SpO2 97%   BMI 21.06 kg/m   Physical Exam  Constitutional: He is oriented to person, place, and time and well-developed, well-nourished, and in no distress.  HENT:  Head: Normocephalic and atraumatic.  Right Ear: External ear normal.  Left Ear: External ear normal.  Nose: Nose normal.  Eyes: Conjunctivae are normal. No scleral icterus.  Neck: No thyromegaly present.  Cardiovascular: Normal rate, regular rhythm and normal heart sounds.  Pulmonary/Chest: Effort normal and breath sounds normal.    Abdominal: Soft.  Musculoskeletal: He exhibits edema.  Left leg no edema.Right leg trace-1+ edema.  Neurological: He is alert and oriented to person, place, and time. Gait normal. GCS score is 15.  Skin: Skin is warm and dry.  Psychiatric: Mood, memory, affect and judgment normal.    Assessment and Plan :  1. Weakness  - EKG 12-Lead - CBC with Differential/Platelet - TSH  2. Benign prostatic hyperplasia, unspecified whether lower urinary tract symptoms present  - CBC with Differential/Platelet - PSA  3. Myalgia  - CK  4. Congestive heart failure, unspecified HF chronicity, unspecified heart failure type ([place]) Clinically I think pt is stable. - Comprehensive metabolic panel - DG Chest 2 View; Future - Pro b natriuretic peptide (BNP) 5.Recurrent Pneumonia  I have done the exam and reviewed the chart and it is accurate to the best of my knowledge. Development worker, community has been used and  any errors in dictation or transcription are unintentional. Miguel Aschoff M.D. [facility]

## 2018-01-02 ENCOUNTER — Telehealth: Payer: Self-pay

## 2018-01-02 DIAGNOSIS — L570 Actinic keratosis: Secondary | ICD-10-CM | POA: Diagnosis not present

## 2018-01-02 LAB — CBC WITH DIFFERENTIAL/PLATELET
BASOS ABS: 0 10*3/uL (ref 0.0–0.2)
BASOS: 0 %
EOS (ABSOLUTE): 0 10*3/uL (ref 0.0–0.4)
Eos: 0 %
HEMOGLOBIN: 14.1 g/dL (ref 13.0–17.7)
Hematocrit: 41.6 % (ref 37.5–51.0)
IMMATURE GRANS (ABS): 0 10*3/uL (ref 0.0–0.1)
Immature Granulocytes: 0 %
LYMPHS ABS: 1.3 10*3/uL (ref 0.7–3.1)
LYMPHS: 10 %
MCH: 32 pg (ref 26.6–33.0)
MCHC: 33.9 g/dL (ref 31.5–35.7)
MCV: 94 fL (ref 79–97)
MONOCYTES: 8 %
Monocytes Absolute: 1 10*3/uL — ABNORMAL HIGH (ref 0.1–0.9)
NEUTROS ABS: 10.1 10*3/uL — AB (ref 1.4–7.0)
Neutrophils: 82 %
Platelets: 262 10*3/uL (ref 150–450)
RBC: 4.41 x10E6/uL (ref 4.14–5.80)
RDW: 13.3 % (ref 12.3–15.4)
WBC: 12.4 10*3/uL — ABNORMAL HIGH (ref 3.4–10.8)

## 2018-01-02 LAB — COMPREHENSIVE METABOLIC PANEL
A/G RATIO: 1.7 (ref 1.2–2.2)
ALBUMIN: 4 g/dL (ref 3.5–4.8)
ALK PHOS: 166 IU/L — AB (ref 39–117)
ALT: 14 IU/L (ref 0–44)
AST: 16 IU/L (ref 0–40)
BILIRUBIN TOTAL: 0.6 mg/dL (ref 0.0–1.2)
BUN / CREAT RATIO: 14 (ref 10–24)
BUN: 14 mg/dL (ref 8–27)
CHLORIDE: 101 mmol/L (ref 96–106)
CO2: 21 mmol/L (ref 20–29)
Calcium: 9.2 mg/dL (ref 8.6–10.2)
Creatinine, Ser: 0.99 mg/dL (ref 0.76–1.27)
GFR calc non Af Amer: 72 mL/min/{1.73_m2} (ref >59)
GFR, EST AFRICAN AMERICAN: 83 mL/min/{1.73_m2} (ref >59)
GLUCOSE: 100 mg/dL — AB (ref 65–99)
Globulin, Total: 2.4 g/dL (ref 1.5–4.5)
POTASSIUM: 4.2 mmol/L (ref 3.5–5.2)
Sodium: 138 mmol/L (ref 134–144)
TOTAL PROTEIN: 6.4 g/dL (ref 6.0–8.5)

## 2018-01-02 LAB — PRO B NATRIURETIC PEPTIDE: NT-Pro BNP: 442 pg/mL (ref 0–486)

## 2018-01-02 LAB — PSA: Prostate Specific Ag, Serum: 5.4 ng/mL — ABNORMAL HIGH (ref 0.0–4.0)

## 2018-01-02 LAB — TSH: TSH: 0.628 u[IU]/mL (ref 0.450–4.500)

## 2018-01-02 LAB — CK: Total CK: 117 U/L (ref 24–204)

## 2018-01-02 NOTE — Telephone Encounter (Signed)
-----   Message from Jerrol Banana., MD sent at 01/02/2018 11:03 AM EDT ----- Labs stable.

## 2018-01-02 NOTE — Telephone Encounter (Signed)
Pt advised.   Thanks,   -Laura  

## 2018-01-02 NOTE — Telephone Encounter (Signed)
-----   Message from Jerrol Banana., MD sent at 01/02/2018 10:58 AM EDT ----- CXR normal.

## 2018-01-13 ENCOUNTER — Ambulatory Visit: Payer: Self-pay | Admitting: Family Medicine

## 2018-01-13 DIAGNOSIS — J328 Other chronic sinusitis: Secondary | ICD-10-CM | POA: Diagnosis not present

## 2018-01-13 DIAGNOSIS — J349 Unspecified disorder of nose and nasal sinuses: Secondary | ICD-10-CM | POA: Diagnosis not present

## 2018-01-14 ENCOUNTER — Ambulatory Visit (INDEPENDENT_AMBULATORY_CARE_PROVIDER_SITE_OTHER): Payer: PPO | Admitting: Family Medicine

## 2018-01-14 VITALS — BP 127/73 | HR 77 | Temp 98.6°F | Resp 16 | Wt 158.0 lb

## 2018-01-14 DIAGNOSIS — N401 Enlarged prostate with lower urinary tract symptoms: Secondary | ICD-10-CM | POA: Diagnosis not present

## 2018-01-14 DIAGNOSIS — R351 Nocturia: Secondary | ICD-10-CM | POA: Diagnosis not present

## 2018-01-14 DIAGNOSIS — J189 Pneumonia, unspecified organism: Secondary | ICD-10-CM

## 2018-01-14 DIAGNOSIS — I214 Non-ST elevation (NSTEMI) myocardial infarction: Secondary | ICD-10-CM | POA: Diagnosis not present

## 2018-01-14 DIAGNOSIS — Z8614 Personal history of Methicillin resistant Staphylococcus aureus infection: Secondary | ICD-10-CM

## 2018-01-14 NOTE — Progress Notes (Signed)
RYMAN RATHGEBER  MRN: [id number] DOB: 07-Feb-1941  Subjective:  HPI   The patient is a 77 year old male who presents for follow up of weakness from a visit 2 weeks ago.  The patient states that his weakness, fatigue is better.   He is still having the urinary problems that he had on the last visit.  He stated that the last thing discussed at that last visit was that he may need to have a referral to urology.  Patient said that he was ready for you to do the referral.   The patient complains of new problem today.  He states that for about a month now he has had RLQ pain that he thought was a pulled muscle.  He said it was present on the last visit but he did not mention it as he thought it would improve.  The patient feels that it has actually gotten worse instead of better.   Patient Active Problem List   Diagnosis Date Noted  . SOB (shortness of breath) 08/13/2017  . Sepsis ([place]) 11/05/2016  . Diverticulitis 11/05/2016  . History of deep vein thrombosis (DVT) of lower extremity 11/05/2016  . HCAP (healthcare-associated pneumonia) 08/29/2016  . NSTEMI (non-ST elevated myocardial infarction) ([place]) 08/18/2016  . Chronic obstructive pulmonary disease ([place]) 01/02/2016  . DVT, femoral, acute ([place]) 05/15/2015  . Recurrent pneumonia 12/21/2014  . Basal cell carcinoma 10/19/2014  . Abnormal liver enzymes 10/19/2014  . Personal history of methicillin resistant Staphylococcus aureus 10/19/2014  . Cannot sleep 10/19/2014  . Heel pain 10/19/2014  . Pain in shoulder 10/19/2014  . Back strain 10/19/2014  . Pulmonary aspergillosis, allergic bronchopulmonary type ([place]) 07/30/2014  . ABPA (allergic bronchopulmonary aspergillosis) ([place]) 07/30/2014  . CAP (community acquired pneumonia) 05/03/2014  . Abnormal CXR 04/01/2014  . Paroxysmal tachycardia ([place]) 06/23/2013  . Asymptomatic PVCs 06/23/2013  . Beat, premature ventricular 06/23/2013  . Cough 10/19/2012  . Allergic rhinitis 01/17/2009  . Asthma,  chronic obstructive, without status asthmaticus ([place]) 11/04/2008  . CAFL (chronic airflow limitation) ([place]) 11/04/2008  . Essential hypertension 02/09/2008  . Arthropathia 08/28/2007  . Disorder of mitral valve 07/08/2005  . Benign prostatic hypertrophy without urinary obstruction 05/16/1997  . LBP (low back pain) 05/16/1997  . Degenerative arthritis of hip 03/18/1995    Past Medical History:  Diagnosis Date  . AB (asthmatic bronchitis)   . Allergic rhinitis   . Arthropathy   . Asthma   . Cellulitis   . DVT (deep venous thrombosis) ([place])   . Foreign body in skin   . Glaucoma   . Hx MRSA infection   . Hx MRSA infection   . Hypertension   . Insomnia   . Insomnia   . Mitral valve disorder   . Osteoarthrosis   . Upper back strain     Social History   Socioeconomic History  . Marital status: Married    Spouse name: Everlene Farrier  . Number of children: 2  . Years of education: College  . Highest education level: Bachelor's degree (e.g., BA, AB, BS)  Occupational History  . Occupation: Retired    Fish farm manager: RETIRED    CommentBiomedical scientist  Social Needs  . Financial resource strain: Not hard at all  . Food insecurity:    Worry: Never true    Inability: Never true  . Transportation needs:    Medical: No    Non-medical: No  Tobacco Use  . Smoking status: Former Smoker    Packs/day: 0.50  Years: 3.00    Pack years: 1.50    Types: Cigarettes    Last attempt to quit: 06/17/1958    Years since quitting: 59.6  . Smokeless tobacco: Never Used  . Tobacco comment: smoking while in college  Substance and Sexual Activity  . Alcohol use: Yes    Alcohol/week: 3.0 oz    Types: 5 Glasses of wine per week  . Drug use: No  . Sexual activity: Yes  Lifestyle  . Physical activity:    Days per week: Not on file    Minutes per session: Not on file  . Stress: Not at all  Relationships  . Social connections:    Talks on phone: Not on file    Gets together: Not on file    Attends  religious service: Not on file    Active member of club or organization: Not on file    Attends meetings of clubs or organizations: Not on file    Relationship status: Not on file  . Intimate partner violence:    Fear of current or ex partner: Not on file    Emotionally abused: Not on file    Physically abused: Not on file    Forced sexual activity: Not on file  Other Topics Concern  . Not on file  Social History Narrative   He is a married father of 2 daughters.   He smoked casually while in college for about 3 years less than a pack a day.   He drinks 3-4 glass of wine at night. He also has about 2 servings of coffee a day   He is a very active gentleman with [employer] exercise program at least 3-4 days a week.   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   In addition to this standing his exercise, he does lots of gardening and uses a chain saw to cut wood.             Outpatient Encounter Medications as of 01/14/2018  Medication Sig  . ADVAIR HFA 230-21 MCG/ACT inhaler Inhale 2 puffs into the lungs 2 (two) times daily.   Marland Kitchen albuterol (PROVENTIL HFA;VENTOLIN HFA) 108 (90 Base) MCG/ACT inhaler Inhale into the lungs every 6 (six) hours as needed for wheezing or shortness of breath.  Marland Kitchen atorvastatin (LIPITOR) 40 MG tablet Take 1 tablet (40 mg total) by mouth daily at 6 PM.  . brimonidine (ALPHAGAN) 0.15 % ophthalmic solution Place 1 drop into both eyes 2 (two) times daily.   Marland Kitchen diltiazem (CARDIZEM CD) 180 MG 24 hr capsule Take 1 capsule (180 mg total) by mouth daily.  . dorzolamide (TRUSOPT) 2 % ophthalmic solution Place 1 drop into both eyes 2 (two) times daily.   . isosorbide mononitrate (IMDUR) 30 MG 24 hr tablet Take 1 tablet (30 mg total) by mouth daily.  Marland Kitchen latanoprost (XALATAN) 0.005 % ophthalmic solution Place 1 drop into both eyes at bedtime.   Marland Kitchen tiotropium (SPIRIVA HANDIHALER) 18 MCG inhalation capsule Place 18 mcg into inhaler and inhale daily.   Alveda Reasons 10 MG TABS tablet Take 10 mg by mouth daily.   . [DISCONTINUED] gabapentin (NEURONTIN) 400 MG capsule Take 400 mg by mouth at bedtime.    No facility-administered encounter medications on file as of 01/14/2018.     Allergies  Allergen Reactions  . Timolol Maleate [Timolol Maleate] Shortness Of Breath  . Bactrim [Sulfamethoxazole-Trimethoprim]     Pt says "it takes his skin  pigment off  . Budesonide-Formoterol Fumarate Other (See Comments)    SYMBICORT Patient reports dizziness and trembling/shaky feeling.  . Lisinopril Other (See Comments)    cough Cough    Review of Systems  Constitutional: Negative for fever and malaise/fatigue.  Eyes: Negative.   Respiratory: Negative for cough, shortness of breath and wheezing.   Cardiovascular: Positive for leg swelling (left only and improving.). Negative for chest pain, palpitations, orthopnea and claudication.  Gastrointestinal: Positive for abdominal pain. Negative for blood in stool, constipation, diarrhea, heartburn, nausea and vomiting.  Skin: Negative.   Endo/Heme/Allergies: Negative.   Psychiatric/Behavioral: Negative.     Objective:  BP 127/73 (BP Location: Right Arm, Patient Position: Sitting, Cuff Size: Normal)   Pulse 77   Temp 98.6 F (37 C) (Oral)   Resp 16   Wt 158 lb (71.7 kg)   BMI 20.29 kg/m   Physical Exam  Constitutional: He is oriented to person, place, and time and well-developed, well-nourished, and in no distress.  HENT:  Head: Normocephalic and atraumatic.  Eyes: Conjunctivae are normal. No scleral icterus.  Neck: No thyromegaly present.  Cardiovascular: Normal rate, regular rhythm and normal heart sounds.  Pulmonary/Chest: Effort normal and breath sounds normal.  Abdominal: Soft.  Musculoskeletal: He exhibits no edema.  Neurological: He is alert and oriented to person, place, and time. Gait normal. GCS score is 15.  Skin: Skin is warm and dry.  Psychiatric: Mood, memory, affect and judgment  normal.    Assessment and Plan :  BPH/Nocturia Refer to urology Recurretn Pneumonia Stable for now. HTN HLD  I have done the exam and reviewed the chart and it is accurate to the best of my knowledge. Development worker, community has been used and  any errors in dictation or transcription are unintentional. Miguel Aschoff M.D. [facility]

## 2018-01-15 DIAGNOSIS — R351 Nocturia: Secondary | ICD-10-CM

## 2018-01-15 DIAGNOSIS — N401 Enlarged prostate with lower urinary tract symptoms: Secondary | ICD-10-CM | POA: Insufficient documentation

## 2018-01-21 ENCOUNTER — Encounter: Payer: Self-pay | Admitting: Family Medicine

## 2018-01-21 ENCOUNTER — Telehealth: Payer: Self-pay | Admitting: Family Medicine

## 2018-01-21 DIAGNOSIS — R350 Frequency of micturition: Secondary | ICD-10-CM

## 2018-01-21 NOTE — Telephone Encounter (Signed)
Pt called saying he was in with Dr. Rosanna Randy on the July 31st and was told Dr. Darnell Level was going to send him to a urologist for frequent urination.  He has not heard anything back yet.  I did look in referrals and did not see anything started.  Pt's CB # [phone]  Thanks Con Memos

## 2018-01-22 NOTE — Telephone Encounter (Signed)
Order was placed for urology referral.

## 2018-01-26 ENCOUNTER — Encounter: Payer: Self-pay | Admitting: Urology

## 2018-01-26 ENCOUNTER — Ambulatory Visit: Payer: PPO | Admitting: Urology

## 2018-01-26 VITALS — BP 127/66 | HR 76 | Ht 74.0 in | Wt 168.0 lb

## 2018-01-26 DIAGNOSIS — H401132 Primary open-angle glaucoma, bilateral, moderate stage: Secondary | ICD-10-CM | POA: Diagnosis not present

## 2018-01-26 DIAGNOSIS — R350 Frequency of micturition: Secondary | ICD-10-CM | POA: Diagnosis not present

## 2018-01-26 LAB — URINALYSIS, COMPLETE
BILIRUBIN UA: NEGATIVE
Glucose, UA: NEGATIVE
Ketones, UA: NEGATIVE
Leukocytes, UA: NEGATIVE
NITRITE UA: NEGATIVE
PH UA: 6 (ref 5.0–7.5)
Protein, UA: NEGATIVE
RBC UA: NEGATIVE
Specific Gravity, UA: 1.01 (ref 1.005–1.030)
UUROB: 0.2 mg/dL (ref 0.2–1.0)

## 2018-01-26 MED ORDER — TAMSULOSIN HCL 0.4 MG PO CAPS: 0.4000 mg | ORAL_CAPSULE | Freq: Every day | ORAL | 11 refills | Status: DC

## 2018-01-26 NOTE — Progress Notes (Signed)
01/26/2018 1:16 PM   [name] 04-19-1941 [id number]  Referring provider: Jerrol Banana., MD [address] [place], [place] [address]  Chief Complaint  Patient presents with  . Urinary Frequency    New Patient    HPI: I was consulted to assess the patient's acute onset of nighttime frequency every 2 hours.  It is now every 3 hours.  He stopped gabapentin which had been added and he had leg edema.  Right leg is normalized.  Left leg much improved.  He is still using TED hose.  Flow is reasonable.  Frequency every 2 hours during the day.  He denies a history of kidney stones previous GU surgery and urinary tract infection.  No neurologic issues.  Bowel movements normal.  Modifying factors: There are no other modifying factors  Associated signs and symptoms: There are no other associated signs and symptoms Aggravating and relieving factors: There are no other aggravating or relieving factors Severity: Moderate Duration: Persistent   PMH: Past Medical History:  Diagnosis Date  . AB (asthmatic bronchitis)   . Allergic rhinitis   . Arthropathy   . Asthma   . Cellulitis   . DVT (deep venous thrombosis) ([place])   . Foreign body in skin   . Glaucoma   . Hx MRSA infection   . Hx MRSA infection   . Hypertension   . Insomnia   . Insomnia   . Mitral valve disorder   . Osteoarthrosis   . Upper back strain     Surgical History: Past Surgical History:  Procedure Laterality Date  . APPENDECTOMY    . BRONCHOSCOPY    . LEFT HEART CATH AND CORONARY ANGIOGRAPHY N/A 08/19/2016   Procedure: Left Heart Cath and Coronary Angiography;  Surgeon: Dionisio David, MD;  Location: [facility];  Service: Cardiovascular;  Laterality: N/A;  . NASAL SINUS SURGERY  09/27/2014    Home Medications:  Allergies as of 01/26/2018      Reactions   Timolol Maleate [timolol Maleate] Shortness Of Breath   Bactrim [sulfamethoxazole-trimethoprim]    Pt says "it takes his skin  pigment off   Budesonide-formoterol Fumarate Other (See Comments)   SYMBICORT Patient reports dizziness and trembling/shaky feeling.   Lisinopril Other (See Comments)   cough Cough      Medication List        Accurate as of 01/26/18  1:16 PM. Always use your most recent med list.          ADVAIR HFA 230-21 MCG/ACT inhaler Generic drug:  fluticasone-salmeterol Inhale 2 puffs into the lungs 2 (two) times daily.   albuterol 108 (90 Base) MCG/ACT inhaler Commonly known as:  PROVENTIL HFA;VENTOLIN HFA Inhale into the lungs every 6 (six) hours as needed for wheezing or shortness of breath.   atorvastatin 40 MG tablet Commonly known as:  LIPITOR Take 1 tablet (40 mg total) by mouth daily at 6 PM.   brimonidine 0.15 % ophthalmic solution Commonly known as:  ALPHAGAN Place 1 drop into both eyes 2 (two) times daily.   diltiazem 180 MG 24 hr capsule Commonly known as:  CARDIZEM CD Take 1 capsule (180 mg total) by mouth daily.   dorzolamide 2 % ophthalmic solution Commonly known as:  TRUSOPT Place 1 drop into both eyes 2 (two) times daily.   isosorbide mononitrate 30 MG 24 hr tablet Commonly known as:  IMDUR Take 1 tablet (30 mg total) by mouth daily.   latanoprost 0.005 %  ophthalmic solution Commonly known as:  XALATAN Place 1 drop into both eyes at bedtime.   SPIRIVA HANDIHALER 18 MCG inhalation capsule Generic drug:  tiotropium Place 18 mcg into inhaler and inhale daily.   XARELTO 10 MG Tabs tablet Generic drug:  rivaroxaban Take 10 mg by mouth daily.       Allergies:  Allergies  Allergen Reactions  . Timolol Maleate [Timolol Maleate] Shortness Of Breath  . Bactrim [Sulfamethoxazole-Trimethoprim]     Pt says "it takes his skin pigment off  . Budesonide-Formoterol Fumarate Other (See Comments)    SYMBICORT Patient reports dizziness and trembling/shaky feeling.  . Lisinopril Other (See Comments)    cough Cough    Family History: Family History  Problem  Relation Age of Onset  . Breast cancer Sister   . Heart failure Mother   . Pancreatic cancer Sister   . Alzheimer's disease Father   . Brain cancer Daughter     Social History:  reports that he quit smoking about 59 years ago. His smoking use included cigarettes. He has a 1.50 pack-year smoking history. He has never used smokeless tobacco. He reports that he drinks about 5.0 standard drinks of alcohol per week. He reports that he does not use drugs.  ROS: UROLOGY Frequent Urination?: Yes Hard to postpone urination?: Yes Burning/pain with urination?: No Get up at night to urinate?: No Leakage of urine?: No Urine stream starts and stops?: No Trouble starting stream?: No Do you have to strain to urinate?: No Blood in urine?: No Urinary tract infection?: No Sexually transmitted disease?: No Injury to kidneys or bladder?: No Painful intercourse?: No Weak stream?: No Erection problems?: No Penile pain?: No  Gastrointestinal Nausea?: No Vomiting?: No Indigestion/heartburn?: No Diarrhea?: No Constipation?: No  Constitutional Fever: No Night sweats?: No Weight loss?: No Fatigue?: No  Skin Skin rash/lesions?: No Itching?: No  Eyes Blurred vision?: No Double vision?: No  Ears/Nose/Throat Sore throat?: No Sinus problems?: No  Hematologic/Lymphatic Swollen glands?: No Easy bruising?: No  Cardiovascular Leg swelling?: No Chest pain?: No  Respiratory Cough?: No Shortness of breath?: No  Endocrine Excessive thirst?: No  Musculoskeletal Back pain?: No Joint pain?: No  Neurological Headaches?: No Dizziness?: No  Psychologic Depression?: No Anxiety?: No  Physical Exam: BP 127/66   Pulse 76   Ht 6\' 2"  (1.88 m)   Wt 168 lb (76.2 kg)   BMI 21.57 kg/m   Constitutional:  Alert and oriented, No acute distress. HEENT: [place] AT, moist mucus membranes.  Trachea midline, no masses. Cardiovascular: No clubbing, cyanosis, or edema. Respiratory: Normal  respiratory effort, no increased work of breathing. GI: Abdomen is soft, nontender, nondistended, no abdominal masses GU: 40 g benign prostate Skin: No rashes, bruises or suspicious lesions. Lymph: No cervical or inguinal adenopathy. Neurologic: Grossly intact, no focal deficits, moving all 4 extremities. Psychiatric: Normal mood and affect.  Laboratory Data: Lab Results  Component Value Date   WBC 12.4 (H) 01/01/2018   HGB 14.1 01/01/2018   HCT 41.6 01/01/2018   MCV 94 01/01/2018   PLT 262 01/01/2018    Lab Results  Component Value Date   CREATININE 0.99 01/01/2018    Lab Results  Component Value Date   PSA 3.6 11/17/2013    No results found for: TESTOSTERONE  Lab Results  Component Value Date   HGBA1C 5.4 08/18/2016    Urinalysis    Component Value Date/Time   COLORURINE YELLOW (A) 08/13/2017 1342   APPEARANCEUR CLEAR (A) 08/13/2017 1342  APPEARANCEUR Clear 04/07/2014 1745   LABSPEC 1.014 08/13/2017 1342   LABSPEC 1.012 04/07/2014 1745   PHURINE 7.0 08/13/2017 1342   GLUCOSEU NEGATIVE 08/13/2017 1342   GLUCOSEU Negative 04/07/2014 1745   HGBUR SMALL (A) 08/13/2017 1342   BILIRUBINUR negative 12/10/2017 1630   BILIRUBINUR Negative 04/07/2014 1745   KETONESUR NEGATIVE 08/13/2017 1342   PROTEINUR Positive (A) 12/10/2017 1630   PROTEINUR NEGATIVE 08/13/2017 1342   UROBILINOGEN 0.2 12/10/2017 1630   NITRITE negative 12/10/2017 1630   NITRITE NEGATIVE 08/13/2017 1342   LEUKOCYTESUR Small (1+) (A) 12/10/2017 1630   LEUKOCYTESUR Negative 04/07/2014 1745    Pertinent Imaging:   Assessment & Plan: Patient nighttime frequency likely from fluid mobilization.  Role of alpha-blocker versus watchful waiting described.  Patient has an ill-defined right middle discomfort that I did not think was from the genitourinary system.  It comes and goes and is been present for a few weeks.  We did not x-ray him today.  He would like to try Flomax.  Reassess in 6 weeks.  If he  normalizes we might want to try him off it in the future.  1. Urinary frequency  - Urinalysis, Complete   No follow-ups on file.  Reece Packer, MD  [facility] [address] [place], [place] [address] [phone]

## 2018-01-28 LAB — CULTURE, URINE COMPREHENSIVE

## 2018-02-11 DIAGNOSIS — Z5181 Encounter for therapeutic drug level monitoring: Secondary | ICD-10-CM | POA: Diagnosis not present

## 2018-02-11 DIAGNOSIS — J449 Chronic obstructive pulmonary disease, unspecified: Secondary | ICD-10-CM | POA: Diagnosis not present

## 2018-02-11 DIAGNOSIS — J310 Chronic rhinitis: Secondary | ICD-10-CM | POA: Diagnosis not present

## 2018-02-18 DIAGNOSIS — H903 Sensorineural hearing loss, bilateral: Secondary | ICD-10-CM | POA: Diagnosis not present

## 2018-02-26 DIAGNOSIS — L578 Other skin changes due to chronic exposure to nonionizing radiation: Secondary | ICD-10-CM | POA: Diagnosis not present

## 2018-02-26 DIAGNOSIS — D485 Neoplasm of uncertain behavior of skin: Secondary | ICD-10-CM | POA: Diagnosis not present

## 2018-02-26 DIAGNOSIS — Z85828 Personal history of other malignant neoplasm of skin: Secondary | ICD-10-CM

## 2018-02-26 DIAGNOSIS — C44519 Basal cell carcinoma of skin of other part of trunk: Secondary | ICD-10-CM | POA: Diagnosis not present

## 2018-02-26 HISTORY — DX: Personal history of other malignant neoplasm of skin: Z85.828

## 2018-04-01 ENCOUNTER — Ambulatory Visit (INDEPENDENT_AMBULATORY_CARE_PROVIDER_SITE_OTHER): Payer: PPO | Admitting: Family Medicine

## 2018-04-01 VITALS — BP 148/85 | HR 72 | Temp 97.8°F | Resp 16 | Wt 168.0 lb

## 2018-04-01 DIAGNOSIS — J189 Pneumonia, unspecified organism: Secondary | ICD-10-CM

## 2018-04-01 DIAGNOSIS — I479 Paroxysmal tachycardia, unspecified: Secondary | ICD-10-CM | POA: Diagnosis not present

## 2018-04-01 DIAGNOSIS — N400 Enlarged prostate without lower urinary tract symptoms: Secondary | ICD-10-CM

## 2018-04-01 DIAGNOSIS — I10 Essential (primary) hypertension: Secondary | ICD-10-CM | POA: Diagnosis not present

## 2018-04-01 NOTE — Patient Instructions (Signed)
Discontinue Imdur.

## 2018-04-01 NOTE — Progress Notes (Signed)
[name]  MRN: [id number] DOB: 1940/09/13  Subjective:  HPI   The patient is a 77 year old male who presents for follow up.  Originally he was scheduled for f/u of chronic issues.  However, since the time this appointment was scheduled he has had multiple acute visits to our office and other providers.  He has been followed for fatigue and weakness, Asthma/COPD, nocturia and RLQ pain.   Patient had referral to Urology and was seen on 01/26/18.  He was started on Flomax and instructed to follow up with them in 6 weeks. He was seen by his pulmonologist at [facility] on 02/12/18 and instructed to continue his medications and start on Singulair.  He is to return to see him in 6 months. The patient continues to have some dizzy spells.  He feels it is coming from his Cardizem and Imdur.  He has requested having a discussion about the possibility of stopping them.    Patient Active Problem List   Diagnosis Date Noted  . BPH associated with nocturia 01/15/2018  . SOB (shortness of breath) 08/13/2017  . Sepsis ([place]) 11/05/2016  . Diverticulitis 11/05/2016  . History of deep vein thrombosis (DVT) of lower extremity 11/05/2016  . HCAP (healthcare-associated pneumonia) 08/29/2016  . NSTEMI (non-ST elevated myocardial infarction) ([place]) 08/18/2016  . Chronic obstructive pulmonary disease ([place]) 01/02/2016  . DVT, femoral, acute ([place]) 05/15/2015  . Recurrent pneumonia 12/21/2014  . Basal cell carcinoma 10/19/2014  . Abnormal liver enzymes 10/19/2014  . Personal history of methicillin resistant Staphylococcus aureus 10/19/2014  . Cannot sleep 10/19/2014  . Heel pain 10/19/2014  . Pain in shoulder 10/19/2014  . Back strain 10/19/2014  . Pulmonary aspergillosis, allergic bronchopulmonary type ([place]) 07/30/2014  . ABPA (allergic bronchopulmonary aspergillosis) ([place]) 07/30/2014  . CAP (community acquired pneumonia) 05/03/2014  . Abnormal CXR 04/01/2014  . Paroxysmal tachycardia ([place]) 06/23/2013  .  Asymptomatic PVCs 06/23/2013  . Beat, premature ventricular 06/23/2013  . Cough 10/19/2012  . Allergic rhinitis 01/17/2009  . Asthma, chronic obstructive, without status asthmaticus ([place]) 11/04/2008  . CAFL (chronic airflow limitation) ([place]) 11/04/2008  . Essential hypertension 02/09/2008  . Arthropathia 08/28/2007  . Disorder of mitral valve 07/08/2005  . Benign prostatic hypertrophy without urinary obstruction 05/16/1997  . LBP (low back pain) 05/16/1997  . Degenerative arthritis of hip 03/18/1995    Past Medical History:  Diagnosis Date  . AB (asthmatic bronchitis)   . Allergic rhinitis   . Arthropathy   . Asthma   . Cellulitis   . DVT (deep venous thrombosis) ([place])   . Foreign body in skin   . Glaucoma   . Hx MRSA infection   . Hx MRSA infection   . Hypertension   . Insomnia   . Insomnia   . Mitral valve disorder   . Osteoarthrosis   . Upper back strain     Social History   Socioeconomic History  . Marital status: Married    Spouse name: Everlene Farrier  . Number of children: 2  . Years of education: College  . Highest education level: Bachelor's degree (e.g., BA, AB, BS)  Occupational History  . Occupation: Retired    Fish farm manager: RETIRED    CommentBiomedical scientist  Social Needs  . Financial resource strain: Not hard at all  . Food insecurity:    Worry: Never true    Inability: Never true  . Transportation needs:    Medical: No    Non-medical: No  Tobacco Use  .  Smoking status: Former Smoker    Packs/day: 0.50    Years: 3.00    Pack years: 1.50    Types: Cigarettes    Last attempt to quit: 06/17/1958    Years since quitting: 59.8  . Smokeless tobacco: Never Used  . Tobacco comment: smoking while in college  Substance and Sexual Activity  . Alcohol use: Yes    Alcohol/week: 5.0 standard drinks    Types: 5 Glasses of wine per week  . Drug use: No  . Sexual activity: Yes  Lifestyle  . Physical activity:    Days per week: Not on file    Minutes per session: Not  on file  . Stress: Not at all  Relationships  . Social connections:    Talks on phone: Not on file    Gets together: Not on file    Attends religious service: Not on file    Active member of club or organization: Not on file    Attends meetings of clubs or organizations: Not on file    Relationship status: Not on file  . Intimate partner violence:    Fear of current or ex partner: Not on file    Emotionally abused: Not on file    Physically abused: Not on file    Forced sexual activity: Not on file  Other Topics Concern  . Not on file  Social History Narrative   He is a married father of 2 daughters.   He smoked casually while in college for about 3 years less than a pack a day.   He drinks 3-4 glass of wine at night. He also has about 2 servings of coffee a day   He is a very active gentleman with [employer] exercise program at least 3-4 days a week.   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   In addition to this standing his exercise, he does lots of gardening and uses a chain saw to cut wood.             Outpatient Encounter Medications as of 04/01/2018  Medication Sig  . ADVAIR HFA 230-21 MCG/ACT inhaler Inhale 2 puffs into the lungs 2 (two) times daily.   Marland Kitchen albuterol (PROVENTIL HFA;VENTOLIN HFA) 108 (90 Base) MCG/ACT inhaler Inhale into the lungs every 6 (six) hours as needed for wheezing or shortness of breath.  Marland Kitchen atorvastatin (LIPITOR) 40 MG tablet Take 1 tablet (40 mg total) by mouth daily at 6 PM.  . brimonidine (ALPHAGAN) 0.15 % ophthalmic solution Place 1 drop into both eyes 2 (two) times daily.   Marland Kitchen diltiazem (CARDIZEM CD) 180 MG 24 hr capsule Take 1 capsule (180 mg total) by mouth daily.  . dorzolamide (TRUSOPT) 2 % ophthalmic solution Place 1 drop into both eyes 2 (two) times daily.   . isosorbide mononitrate (IMDUR) 30 MG 24 hr tablet Take 1 tablet (30 mg total) by mouth daily.  Marland Kitchen latanoprost (XALATAN) 0.005 % ophthalmic solution  Place 1 drop into both eyes at bedtime.   . tamsulosin (FLOMAX) 0.4 MG CAPS capsule Take 1 capsule (0.4 mg total) by mouth daily.  Marland Kitchen tiotropium (SPIRIVA HANDIHALER) 18 MCG inhalation capsule Place 18 mcg into inhaler and inhale daily.   Alveda Reasons 10 MG TABS tablet Take 10 mg by mouth daily.    No facility-administered encounter medications on file as of 04/01/2018.     Allergies  Allergen Reactions  . Timolol Maleate [Timolol Maleate] Shortness Of Breath  .  Bactrim [Sulfamethoxazole-Trimethoprim]     Pt says "it takes his skin pigment off  . Budesonide-Formoterol Fumarate Other (See Comments)    SYMBICORT Patient reports dizziness and trembling/shaky feeling.  . Lisinopril Other (See Comments)    cough Cough    Review of Systems  Constitutional: Negative for fever and malaise/fatigue.  HENT: Negative.   Eyes: Negative.   Respiratory: Negative for cough, shortness of breath and wheezing.   Cardiovascular: Negative for chest pain, palpitations, claudication and leg swelling.  Gastrointestinal: Negative.   Neurological: Negative.   Endo/Heme/Allergies: Negative.   Psychiatric/Behavioral: Negative.     Objective:  BP (!) 148/85 (BP Location: Right Arm, Patient Position: Sitting, Cuff Size: Large)   Pulse 72   Temp 97.8 F (36.6 C) (Oral)   Resp 16   Wt 168 lb (76.2 kg)   BMI 21.57 kg/m   Physical Exam  Constitutional: He is oriented to person, place, and time and well-developed, well-nourished, and in no distress.  HENT:  Head: Normocephalic and atraumatic.  Right Ear: External ear normal.  Left Ear: External ear normal.  Nose: Nose normal.  Eyes: Conjunctivae are normal. No scleral icterus.  Neck: No thyromegaly present.  Cardiovascular: Normal rate, regular rhythm and normal heart sounds.  Pulmonary/Chest: Effort normal and breath sounds normal.  Musculoskeletal: He exhibits no edema.  Neurological: He is alert and oriented to person, place, and time. Gait normal.  GCS score is 15.  Skin: Skin is warm and dry.  Psychiatric: Mood, memory, affect and judgment normal.    Assessment and Plan :  1. Paroxysmal tachycardia ([place]) Consider stopping Cardizem or imdur. Pt will f/u if symptoms persist.  2. Essential hypertension   3. Recurrent pneumonia Presently stable.  4. HCAP (healthcare-associated pneumonia) Resolved.  5. Benign prostatic hyperplasia without urinary obstruction  I have done the exam and reviewed the chart and it is accurate to the best of my knowledge. Development worker, community has been used and  any errors in dictation or transcription are unintentional. Miguel Aschoff M.D. [facility]

## 2018-04-16 ENCOUNTER — Ambulatory Visit: Payer: Self-pay | Admitting: Family Medicine

## 2018-04-22 DIAGNOSIS — I82419 Acute embolism and thrombosis of unspecified femoral vein: Secondary | ICD-10-CM | POA: Diagnosis not present

## 2018-04-22 DIAGNOSIS — I479 Paroxysmal tachycardia, unspecified: Secondary | ICD-10-CM | POA: Diagnosis not present

## 2018-04-22 DIAGNOSIS — I25119 Atherosclerotic heart disease of native coronary artery with unspecified angina pectoris: Secondary | ICD-10-CM | POA: Diagnosis not present

## 2018-04-29 ENCOUNTER — Other Ambulatory Visit: Payer: Self-pay

## 2018-04-29 ENCOUNTER — Emergency Department
Admission: EM | Admit: 2018-04-29 | Discharge: 2018-04-29 | Disposition: A | Discharge status: Home / Self Care | Payer: PPO | Attending: Emergency Medicine | Admitting: Emergency Medicine

## 2018-04-29 ENCOUNTER — Emergency Department: Payer: PPO

## 2018-04-29 ENCOUNTER — Encounter: Payer: Self-pay | Admitting: Emergency Medicine

## 2018-04-29 DIAGNOSIS — Z85828 Personal history of other malignant neoplasm of skin: Secondary | ICD-10-CM | POA: Insufficient documentation

## 2018-04-29 DIAGNOSIS — Y999 Unspecified external cause status: Secondary | ICD-10-CM | POA: Diagnosis not present

## 2018-04-29 DIAGNOSIS — S0990XA Unspecified injury of head, initial encounter: Secondary | ICD-10-CM

## 2018-04-29 DIAGNOSIS — I951 Orthostatic hypotension: Secondary | ICD-10-CM | POA: Diagnosis not present

## 2018-04-29 DIAGNOSIS — J449 Chronic obstructive pulmonary disease, unspecified: Secondary | ICD-10-CM | POA: Diagnosis not present

## 2018-04-29 DIAGNOSIS — I10 Essential (primary) hypertension: Secondary | ICD-10-CM | POA: Insufficient documentation

## 2018-04-29 DIAGNOSIS — E860 Dehydration: Secondary | ICD-10-CM | POA: Diagnosis not present

## 2018-04-29 DIAGNOSIS — J45909 Unspecified asthma, uncomplicated: Secondary | ICD-10-CM | POA: Insufficient documentation

## 2018-04-29 DIAGNOSIS — Z79899 Other long term (current) drug therapy: Secondary | ICD-10-CM | POA: Insufficient documentation

## 2018-04-29 DIAGNOSIS — Z87891 Personal history of nicotine dependence: Secondary | ICD-10-CM | POA: Insufficient documentation

## 2018-04-29 DIAGNOSIS — I2510 Atherosclerotic heart disease of native coronary artery without angina pectoris: Secondary | ICD-10-CM | POA: Diagnosis not present

## 2018-04-29 DIAGNOSIS — R42 Dizziness and giddiness: Secondary | ICD-10-CM | POA: Diagnosis not present

## 2018-04-29 DIAGNOSIS — Y939 Activity, unspecified: Secondary | ICD-10-CM | POA: Insufficient documentation

## 2018-04-29 DIAGNOSIS — Y929 Unspecified place or not applicable: Secondary | ICD-10-CM | POA: Insufficient documentation

## 2018-04-29 DIAGNOSIS — W19XXXA Unspecified fall, initial encounter: Secondary | ICD-10-CM

## 2018-04-29 DIAGNOSIS — Z7901 Long term (current) use of anticoagulants: Secondary | ICD-10-CM | POA: Insufficient documentation

## 2018-04-29 DIAGNOSIS — W01198A Fall on same level from slipping, tripping and stumbling with subsequent striking against other object, initial encounter: Secondary | ICD-10-CM | POA: Insufficient documentation

## 2018-04-29 DIAGNOSIS — R531 Weakness: Secondary | ICD-10-CM | POA: Diagnosis not present

## 2018-04-29 LAB — URINALYSIS, COMPLETE (UACMP) WITH MICROSCOPIC
Bacteria, UA: NONE SEEN
Bilirubin Urine: NEGATIVE
Glucose, UA: NEGATIVE mg/dL
HGB URINE DIPSTICK: NEGATIVE
Ketones, ur: 20 mg/dL — AB
Leukocytes, UA: NEGATIVE
NITRITE: NEGATIVE
PH: 7 (ref 5.0–8.0)
Protein, ur: NEGATIVE mg/dL
SPECIFIC GRAVITY, URINE: 1.014 (ref 1.005–1.030)
Squamous Epithelial / LPF: NONE SEEN (ref 0–5)

## 2018-04-29 LAB — CBC
HCT: 44.9 % (ref 39.0–52.0)
Hemoglobin: 15.2 g/dL (ref 13.0–17.0)
MCH: 32.5 pg (ref 26.0–34.0)
MCHC: 33.9 g/dL (ref 30.0–36.0)
MCV: 95.9 fL (ref 80.0–100.0)
Platelets: 169 10*3/uL (ref 150–400)
RBC: 4.68 MIL/uL (ref 4.22–5.81)
RDW: 13 % (ref 11.5–15.5)
WBC: 8.8 10*3/uL (ref 4.0–10.5)
nRBC: 0 % (ref 0.0–0.2)

## 2018-04-29 LAB — BASIC METABOLIC PANEL
ANION GAP: 7 (ref 5–15)
BUN: 13 mg/dL (ref 8–23)
CO2: 25 mmol/L (ref 22–32)
Calcium: 8.6 mg/dL — ABNORMAL LOW (ref 8.9–10.3)
Chloride: 100 mmol/L (ref 98–111)
Creatinine, Ser: 1.14 mg/dL (ref 0.61–1.24)
GFR, EST NON AFRICAN AMERICAN: 59 mL/min — AB (ref >60)
Glucose, Bld: 104 mg/dL — ABNORMAL HIGH (ref 70–99)
Potassium: 4 mmol/L (ref 3.5–5.1)
SODIUM: 132 mmol/L — AB (ref 135–145)

## 2018-04-29 LAB — TROPONIN I: Troponin I: <0.03 ng/mL (ref <0.03)

## 2018-04-29 LAB — CG4 I-STAT (LACTIC ACID): LACTIC ACID, VENOUS: 1.09 mmol/L (ref 0.5–1.9)

## 2018-04-29 MED ORDER — SODIUM CHLORIDE 0.9 % IV BOLUS
1000.0000 mL | Freq: Once | INTRAVENOUS | Status: AC
  Administered 2018-04-29: 1000 mL via INTRAVENOUS

## 2018-04-29 MED ORDER — ACETAMINOPHEN 500 MG PO TABS
1000.0000 mg | ORAL_TABLET | Freq: Once | ORAL | Status: AC
  Administered 2018-04-29: 1000 mg via ORAL
  Filled 2018-04-29: qty 2

## 2018-04-29 NOTE — ED Notes (Signed)
FIRST NURSE NOTE:  Pt arrived via wheelchair from [facility] with reports of nausea and fever. Pt had fall yesterday injuring his top lip, pt is on Xarelto. Pt had shingles vaccine on Monday.

## 2018-04-29 NOTE — ED Triage Notes (Signed)
Pt was sent my [facility] with multiple complaints. Pt states he feels weak, is nauseated, felt dizzy yesterday and fell. Pt on Xarelto. Pt states he hip his lip but unclear if there was a loc. Pt denies any pain at this time.

## 2018-04-29 NOTE — ED Provider Notes (Signed)
[facility] Emergency Department Provider Note  ____________________________________________  Time seen: Approximately 2:57 PM  I have reviewed the triage vital signs and the nursing notes.   HISTORY  Chief Complaint Weakness   HPI [name] is a 77 y.o. male with history of CAD, DVT on Xarelto, asthmawho presents for evaluation of generalized weakness. Patient reports that he was in usual state of health 2 days ago when he went to his doctor and had a shingles vaccine. That evening he started feeling very weak. Yesterday he had a fall due to generalized weakness and hit his head on the floor. Sustained a sahllow laceration on the upper lip. No LOC. The weakness has remained constant for the last 2 days. Today had a low-grade fever at home. Has had mild nauseabut no vomiting, no changes in his chronic cough, no URI symptoms, no chest pain or shortness of breath, no headache or neck stiffness, no neck pain or back pain, no abdominal pain, no diarrhea or constipation, no dysuria or hematuria.  Past Medical History:  Diagnosis Date  . AB (asthmatic bronchitis)   . Allergic rhinitis   . Arthropathy   . Asthma   . Cellulitis   . DVT (deep venous thrombosis) ([place])   . Foreign body in skin   . Glaucoma   . Hx MRSA infection   . Hx MRSA infection   . Hypertension   . Insomnia   . Insomnia   . Mitral valve disorder   . Osteoarthrosis   . Upper back strain     Patient Active Problem List   Diagnosis Date Noted  . BPH associated with nocturia 01/15/2018  . SOB (shortness of breath) 08/13/2017  . Sepsis ([place]) 11/05/2016  . Diverticulitis 11/05/2016  . History of deep vein thrombosis (DVT) of lower extremity 11/05/2016  . HCAP (healthcare-associated pneumonia) 08/29/2016  . NSTEMI (non-ST elevated myocardial infarction) ([place]) 08/18/2016  . Chronic obstructive pulmonary disease ([place]) 01/02/2016  . DVT, femoral, acute ([place]) 05/15/2015  . Recurrent  pneumonia 12/21/2014  . Basal cell carcinoma 10/19/2014  . Abnormal liver enzymes 10/19/2014  . Personal history of methicillin resistant Staphylococcus aureus 10/19/2014  . Cannot sleep 10/19/2014  . Heel pain 10/19/2014  . Pain in shoulder 10/19/2014  . Back strain 10/19/2014  . Pulmonary aspergillosis, allergic bronchopulmonary type ([place]) 07/30/2014  . ABPA (allergic bronchopulmonary aspergillosis) ([place]) 07/30/2014  . CAP (community acquired pneumonia) 05/03/2014  . Abnormal CXR 04/01/2014  . Paroxysmal tachycardia ([place]) 06/23/2013  . Asymptomatic PVCs 06/23/2013  . Beat, premature ventricular 06/23/2013  . Cough 10/19/2012  . Allergic rhinitis 01/17/2009  . Asthma, chronic obstructive, without status asthmaticus ([place]) 11/04/2008  . CAFL (chronic airflow limitation) ([place]) 11/04/2008  . Essential hypertension 02/09/2008  . Arthropathia 08/28/2007  . Disorder of mitral valve 07/08/2005  . Benign prostatic hyperplasia without urinary obstruction 05/16/1997  . LBP (low back pain) 05/16/1997  . Degenerative arthritis of hip 03/18/1995    Past Surgical History:  Procedure Laterality Date  . APPENDECTOMY    . BRONCHOSCOPY    . LEFT HEART CATH AND CORONARY ANGIOGRAPHY N/A 08/19/2016   Procedure: Left Heart Cath and Coronary Angiography;  Surgeon: Dionisio David, MD;  Location: [facility];  Service: Cardiovascular;  Laterality: N/A;  . NASAL SINUS SURGERY  09/27/2014    Prior to Admission medications   Medication Sig Start Date End Date Taking? Authorizing Provider  ADVAIR HFA 230-21 MCG/ACT inhaler Inhale 2 puffs into the lungs  2 (two) times daily.  11/17/15   [provider]  albuterol (PROVENTIL HFA;VENTOLIN HFA) 108 (90 Base) MCG/ACT inhaler Inhale into the lungs every 6 (six) hours as needed for wheezing or shortness of breath.    [provider]  atorvastatin (LIPITOR) 40 MG tablet Take 1 tablet (40 mg total) by mouth daily at 6 PM. 09/15/17   Jerrol Banana., MD  brimonidine (ALPHAGAN) 0.15 % ophthalmic solution Place 1 drop into both eyes 2 (two) times daily.  10/26/15   [provider]  diltiazem (CARDIZEM CD) 180 MG 24 hr capsule Take 1 capsule (180 mg total) by mouth daily. 09/15/17   Jerrol Banana., MD  dorzolamide (TRUSOPT) 2 % ophthalmic solution Place 1 drop into both eyes 2 (two) times daily.  08/31/15   [provider]  isosorbide mononitrate (IMDUR) 30 MG 24 hr tablet Take 1 tablet (30 mg total) by mouth daily. 09/15/17   Jerrol Banana., MD  latanoprost (XALATAN) 0.005 % ophthalmic solution Place 1 drop into both eyes at bedtime.     [provider]  tamsulosin (FLOMAX) 0.4 MG CAPS capsule Take 1 capsule (0.4 mg total) by mouth daily. 01/26/18   Bjorn Loser, MD  tiotropium (SPIRIVA HANDIHALER) 18 MCG inhalation capsule Place 18 mcg into inhaler and inhale daily.  02/06/15 12/08/18  [provider]  XARELTO 10 MG TABS tablet Take 10 mg by mouth daily.  08/05/16   [provider]    Allergies Timolol maleate [timolol maleate]; Bactrim [sulfamethoxazole-trimethoprim]; Budesonide-formoterol fumarate; and Lisinopril  Family History  Problem Relation Age of Onset  . Breast cancer Sister   . Heart failure Mother   . Pancreatic cancer Sister   . Alzheimer's disease Father   . Brain cancer Daughter     Social History Social History   Tobacco Use  . Smoking status: Former Smoker    Packs/day: 0.50    Years: 3.00    Pack years: 1.50    Types: Cigarettes    Last attempt to quit: 06/17/1958    Years since quitting: 59.9  . Smokeless tobacco: Never Used  . Tobacco comment: smoking while in college  Substance Use Topics  . Alcohol use: Yes    Alcohol/week: 5.0 standard drinks    Types: 5 Glasses of wine per week  . Drug use: No    Review of Systems  Constitutional: + fever and generalized weakness Eyes: Negative for visual changes. ENT: Negative for sore  throat. Neck: No neck pain  Cardiovascular: Negative for chest pain. Respiratory: Negative for shortness of breath. Gastrointestinal: Negative for abdominal pain, vomiting or diarrhea. + nausea Genitourinary: Negative for dysuria. Musculoskeletal: Negative for back pain. Skin: Negative for rash. Neurological: Negative for headaches, weakness or numbness. Psych: No SI or HI  ____________________________________________   PHYSICAL EXAM:  VITAL SIGNS: ED Triage Vitals  Enc Vitals Group     BP 04/29/18 1332 140/62     Pulse Rate 04/29/18 1332 96     Resp 04/29/18 1332 20     Temp 04/29/18 1332 100 F (37.8 C)     Temp Source 04/29/18 1332 Oral     SpO2 04/29/18 1332 100 %     Weight 04/29/18 1330 162 lb (73.5 kg)     Height 04/29/18 1330 6\' 2"  (1.88 m)     Head Circumference --      Peak Flow --      Pain Score 04/29/18 1330 0  Pain Loc --      Pain Edu? --      Excl. in [place]? --    Constitutional: Alert and oriented. No acute distress. Does not appear intoxicated. HEENT Head: Normocephalic and atraumatic. Face: No facial bony tenderness. Stable midface Ears: No hemotympanum bilaterally. No Battle sign Eyes: No eye injury. PERRL. No raccoon eyes Nose: Nontender. No epistaxis. No rhinorrhea Mouth/Throat: Mucous membranes are moist. No oropharyngeal blood. No dental injury. Airway patent without stridor. Normal voice. Abrasion of the upper lip Neck: no C-collar in place. No midline c-spine tenderness.  Cardiovascular: Normal rate, regular rhythm. Normal and symmetric distal pulses are present in all extremities. Pulmonary/Chest: Chest wall is stable and nontender to palpation/compression. Normal respiratory effort. Breath sounds are normal. No crepitus.  Abdominal: Soft, nontender, non distended. Musculoskeletal: Nontender with normal full range of motion in all extremities. No deformities. No thoracic or lumbar midline spinal tenderness. Pelvis is stable. Skin: Skin is  warm, dry and intact. No abrasions or contutions. Psychiatric: Speech and behavior are appropriate. Neurological: Normal speech and language. Moves all extremities to command. No gross focal neurologic deficits are appreciated.  Glascow Coma Score: 4 - Opens eyes on own 6 - Follows simple motor commands 5 - Alert and oriented GCS: 15  ____________________________________________   LABS (all labs ordered are listed, but only abnormal results are displayed)  Labs Reviewed  BASIC METABOLIC PANEL - Abnormal; Notable for the following components:      Result Value   Sodium 132 (*)    Glucose, Bld 104 (*)    Calcium 8.6 (*)    GFR calc non Af Amer 59 (*)    All other components within normal limits  URINALYSIS, COMPLETE (UACMP) WITH MICROSCOPIC - Abnormal; Notable for the following components:   Color, Urine YELLOW (*)    APPearance CLEAR (*)    Ketones, ur 20 (*)    All other components within normal limits  CBC  TROPONIN I  I-STAT CG4 LACTIC ACID, ED  CG4 I-STAT (LACTIC ACID)   ____________________________________________  EKG  ED ECG REPORT I, Rudene Re, the attending physician, personally viewed and interpreted this ECG.  Sinus rhythm, with a first-degree AV block, rate of 100,Left axis deviation, no ST elevations or depressions, Q waves in inferior and anterior leads. no significant changes when compared to prior. ____________________________________________  RADIOLOGY  I have personally reviewed the images performed during this visit and I agree with the Radiologist's read.   Interpretation by Radiologist:  Dg Chest 2 View  Result Date: 04/29/2018 CLINICAL DATA:  Weakness EXAM: CHEST - 2 VIEW COMPARISON:  01/01/2018 FINDINGS: Cardiac shadow is within normal limits. The lungs are hyperinflated bilaterally. No acute infiltrate or sizable effusion is noted. No acute bony abnormality is seen. IMPRESSION: No acute abnormality noted. COPD. Electronically Signed    By: Inez Catalina M.D.   On: 04/29/2018 14:07   Ct Head Wo Contrast  Result Date: 04/29/2018 CLINICAL DATA:  Felt dizzy yesterday, fell, patient on Xarelto EXAM: CT HEAD WITHOUT CONTRAST TECHNIQUE: Contiguous axial images were obtained from the base of the skull through the vertex without intravenous contrast. COMPARISON:  08/04/2014 FINDINGS: Brain: No evidence of acute infarction, hemorrhage, extra-axial collection, ventriculomegaly, or mass effect. Generalized cerebral atrophy. Periventricular white matter low attenuation likely secondary to microangiopathy. Vascular: Cerebrovascular atherosclerotic calcifications are noted. Skull: Negative for fracture or focal lesion. Sinuses/Orbits: Visualized portions of the orbits are unremarkable. Mastoid sinuses are clear. Bilateral ethmoid sinus, right frontal sinus  and sphenoid sinus mucosal thickening. Other: None. IMPRESSION: No acute intracranial pathology. Electronically Signed   By: Kathreen Devoid   On: 04/29/2018 15:29     ____________________________________________   PROCEDURES  Procedure(s) performed: None Procedures Critical Care performed:  None ____________________________________________   INITIAL IMPRESSION / ASSESSMENT AND PLAN / ED COURSE   77 y.o. male with history of CAD, DVT on Xarelto, asthmawho presents for evaluation of generalized weakness, nausea, fever. symptoms started after patient received the shingles vaccine on Monday. Symptoms could be related to the vaccine or possibly a viral syndrome. Low grade fever here. No signs of flu. Patient is orthostatic on exam concerning for mild dehydration. We'll give IV fluids.exam is otherwise nonfocal with no signs of stroke. Lactic is normal, normal white count, slightly elevated creatinine which agrees with mild dehydration. no evidence of anemia, electrolytes are within normal limits. Head CT ordered due to fall on Xarelto. UA pending to rule out UTI.   Clinical Course as of Apr 29 1836  Wed Apr 29, 2018  [id number] patient remained orthostatic after a liter of fluid. He received second liter. He feels markedly improved. UA negative for UTI showing mild ketones consistent with dehydration. Head CT with no evidence of any intracranial trauma. At this time patient is stable for discharge home. Encourage increased oral hydration and close follow-up with primary care doctor. Discussed return precautions.   [CV]    Clinical Course User Index [CV] Alfred Levins [place], MD     As part of my medical decision making, I reviewed the following data within the [place] History obtained from family, Nursing notes reviewed and incorporated, Labs reviewed , EKG interpreted , Old EKG reviewed, Old chart reviewed, Radiograph reviewed , Notes from prior ED visits and [place] Controlled Substance Database    Pertinent labs & imaging results that were available during my care of the patient were reviewed by me and considered in my medical decision making (see chart for details).    ____________________________________________   FINAL CLINICAL IMPRESSION(S) / ED DIAGNOSES  Final diagnoses:  Orthostatic hypotension  Dehydration  Fall, initial encounter  Traumatic injury of head, initial encounter  Generalized weakness      NEW MEDICATIONS STARTED DURING THIS VISIT:  ED Discharge Orders    None       Note:  This document was prepared using Dragon voice recognition software and may include unintentional dictation errors.    Rudene Re, MD 04/29/18 [phone]

## 2018-04-29 NOTE — ED Notes (Signed)
Pt denies any dizziness, lightheadedness, shortness of breath upon standing.  Pt remains orthostatic, has no complaints at this time.  EDP notified.

## 2018-05-04 ENCOUNTER — Ambulatory Visit (INDEPENDENT_AMBULATORY_CARE_PROVIDER_SITE_OTHER): Payer: PPO | Admitting: Physician Assistant

## 2018-05-04 ENCOUNTER — Encounter: Payer: Self-pay | Admitting: Physician Assistant

## 2018-05-04 VITALS — BP 120/60 | HR 78 | Temp 97.8°F | Resp 16 | Ht 74.0 in | Wt 161.0 lb

## 2018-05-04 DIAGNOSIS — J029 Acute pharyngitis, unspecified: Secondary | ICD-10-CM

## 2018-05-04 LAB — POCT RAPID STREP A (OFFICE): Rapid Strep A Screen: NEGATIVE

## 2018-05-04 NOTE — Patient Instructions (Signed)

## 2018-05-04 NOTE — Progress Notes (Signed)
Acute Office Visit  Subjective:    Patient ID: [name], male    DOB: Nov 10, 1940, 77 y.o.   MRN: [id number]  Chief Complaint  Patient presents with  . Sore Throat  . Follow-up    HPI Patient is in today c/o sore throat x's 2 days. Patient denies any fever or contact with any person with strep. Patient reports some cough, denies head aches or abdominal pain. Does feel his sore throat is actually getting better this morning.   Past Medical History:  Diagnosis Date  . AB (asthmatic bronchitis)   . Allergic rhinitis   . Arthropathy   . Asthma   . Cellulitis   . DVT (deep venous thrombosis) ([place])   . Foreign body in skin   . Glaucoma   . Hx MRSA infection   . Hx MRSA infection   . Hypertension   . Insomnia   . Insomnia   . Mitral valve disorder   . Osteoarthrosis   . Upper back strain     Past Surgical History:  Procedure Laterality Date  . APPENDECTOMY    . BRONCHOSCOPY    . LEFT HEART CATH AND CORONARY ANGIOGRAPHY N/A 08/19/2016   Procedure: Left Heart Cath and Coronary Angiography;  Surgeon: Dionisio David, MD;  Location: [facility];  Service: Cardiovascular;  Laterality: N/A;  . NASAL SINUS SURGERY  09/27/2014    Family History  Problem Relation Age of Onset  . Breast cancer Sister   . Heart failure Mother   . Pancreatic cancer Sister   . Alzheimer's disease Father   . Brain cancer Daughter     Social History   Socioeconomic History  . Marital status: Married    Spouse name: Everlene Farrier  . Number of children: 2  . Years of education: College  . Highest education level: Bachelor's degree (e.g., BA, AB, BS)  Occupational History  . Occupation: Retired    Fish farm manager: RETIRED    CommentBiomedical scientist  Social Needs  . Financial resource strain: Not hard at all  . Food insecurity:    Worry: Never true    Inability: Never true  . Transportation needs:    Medical: No    Non-medical: No  Tobacco Use  . Smoking status: Former Smoker    Packs/day:  0.50    Years: 3.00    Pack years: 1.50    Types: Cigarettes    Last attempt to quit: 06/17/1958    Years since quitting: 59.9  . Smokeless tobacco: Never Used  . Tobacco comment: smoking while in college  Substance and Sexual Activity  . Alcohol use: Yes    Alcohol/week: 5.0 standard drinks    Types: 5 Glasses of wine per week  . Drug use: No  . Sexual activity: Yes  Lifestyle  . Physical activity:    Days per week: Not on file    Minutes per session: Not on file  . Stress: Not at all  Relationships  . Social connections:    Talks on phone: Not on file    Gets together: Not on file    Attends religious service: Not on file    Active member of club or organization: Not on file    Attends meetings of clubs or organizations: Not on file    Relationship status: Not on file  . Intimate partner violence:    Fear of current or ex partner: Not on file    Emotionally abused: Not on file  Physically abused: Not on file    Forced sexual activity: Not on file  Other Topics Concern  . Not on file  Social History Narrative   He is a married father of 2 daughters.   He smoked casually while in college for about 3 years less than a pack a day.   He drinks 3-4 glass of wine at night. He also has about 2 servings of coffee a day   He is a very active gentleman with [employer] exercise program at least 3-4 days a week.   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   In addition to this standing his exercise, he does lots of gardening and uses a chain saw to cut wood.             Outpatient Medications Prior to Visit  Medication Sig Dispense Refill  . ADVAIR HFA 230-21 MCG/ACT inhaler Inhale 2 puffs into the lungs 2 (two) times daily.     Marland Kitchen albuterol (PROVENTIL HFA;VENTOLIN HFA) 108 (90 Base) MCG/ACT inhaler Inhale into the lungs every 6 (six) hours as needed for wheezing or shortness of breath.    Marland Kitchen atorvastatin (LIPITOR) 40 MG tablet Take 1 tablet (40 mg  total) by mouth daily at 6 PM. 90 tablet 3  . brimonidine (ALPHAGAN) 0.15 % ophthalmic solution Place 1 drop into both eyes 2 (two) times daily.     Marland Kitchen diltiazem (CARDIZEM CD) 180 MG 24 hr capsule Take 1 capsule (180 mg total) by mouth daily. 90 capsule 3  . dorzolamide (TRUSOPT) 2 % ophthalmic solution Place 1 drop into both eyes 2 (two) times daily.     . isosorbide mononitrate (IMDUR) 30 MG 24 hr tablet Take 1 tablet (30 mg total) by mouth daily. 90 tablet 3  . latanoprost (XALATAN) 0.005 % ophthalmic solution Place 1 drop into both eyes at bedtime.     . tamsulosin (FLOMAX) 0.4 MG CAPS capsule Take 1 capsule (0.4 mg total) by mouth daily. 30 capsule 11  . tiotropium (SPIRIVA HANDIHALER) 18 MCG inhalation capsule Place 18 mcg into inhaler and inhale daily.     Alveda Reasons 10 MG TABS tablet Take 10 mg by mouth daily.      No facility-administered medications prior to visit.     Allergies  Allergen Reactions  . Timolol Maleate [Timolol Maleate] Shortness Of Breath  . Bactrim [Sulfamethoxazole-Trimethoprim]     Pt says "it takes his skin pigment off  . Budesonide-Formoterol Fumarate Other (See Comments)    SYMBICORT Patient reports dizziness and trembling/shaky feeling.  . Lisinopril Other (See Comments)    cough Cough    Review of Systems  Constitutional: Negative.   HENT: Positive for sore throat.   Cardiovascular: Negative.        Objective:    Physical Exam  Constitutional: He is oriented to person, place, and time. He appears well-developed and well-nourished.  HENT:  Right Ear: External ear normal.  Left Ear: External ear normal.  Mouth/Throat: Posterior oropharyngeal erythema present. No oropharyngeal exudate or posterior oropharyngeal edema.  Neck: Neck supple.  Cardiovascular: Normal rate and regular rhythm.  Pulmonary/Chest: Effort normal and breath sounds normal.  Lymphadenopathy:    He has no cervical adenopathy.  Neurological: He is alert and oriented to person,  place, and time.  Skin: Skin is warm and dry.  Psychiatric: He has a normal mood and affect. His behavior is normal.    There were no vitals taken for  this visit. Wt Readings from Last 3 Encounters:  04/29/18 162 lb (73.5 kg)  04/01/18 168 lb (76.2 kg)  01/26/18 168 lb (76.2 kg)    There are no preventive care reminders to display for this patient.  There are no preventive care reminders to display for this patient.   Lab Results  Component Value Date   TSH 0.628 01/01/2018   Lab Results  Component Value Date   WBC 8.8 04/29/2018   HGB 15.2 04/29/2018   HCT 44.9 04/29/2018   MCV 95.9 04/29/2018   PLT 169 04/29/2018   Lab Results  Component Value Date   NA 132 (L) 04/29/2018   K 4.0 04/29/2018   CO2 25 04/29/2018   GLUCOSE 104 (H) 04/29/2018   BUN 13 04/29/2018   CREATININE 1.14 04/29/2018   BILITOT 0.6 01/01/2018   ALKPHOS 166 (H) 01/01/2018   AST 16 01/01/2018   ALT 14 01/01/2018   PROT 6.4 01/01/2018   ALBUMIN 4.0 01/01/2018   CALCIUM 8.6 (L) 04/29/2018   ANIONGAP 7 04/29/2018   Lab Results  Component Value Date   CHOL 86 (L) 12/10/2017   Lab Results  Component Value Date   HDL 42 12/10/2017   Lab Results  Component Value Date   LDLCALC 35 12/10/2017   Lab Results  Component Value Date   TRIG 45 12/10/2017   Lab Results  Component Value Date   CHOLHDL 2.0 12/10/2017   Lab Results  Component Value Date   HGBA1C 5.4 08/18/2016       Assessment & Plan:  1. Sore throat  Throat slightly erythematous, rapid strep negative. Will send in abx if not approving.   - POCT rapid strep A  Return if symptoms worsen or fail to improve.  The entirety of the information documented in the History of Present Illness, Review of Systems and Physical Exam were personally obtained by me. Portions of this information were initially documented by Lynford Humphrey, CMA and reviewed by me for thoroughness and accuracy.    Shawna Orleans, CMA

## 2018-05-21 ENCOUNTER — Telehealth: Payer: Self-pay

## 2018-05-21 NOTE — Telephone Encounter (Signed)
Patients wife had called the office stating that patient was exposed to Influenza B over Thanksgiving. Patient is currently not showing any signs or symptoms of the flu but she is requesting that a prescription be sent into pharmacy for prevention. Please advise. KW

## 2018-05-21 NOTE — Telephone Encounter (Signed)
If it has been a week since exposure, he is likely safe.  Typically only do prophylaxis within 48 hours.  If insists, ccan Rx Tamiflu 75mg  daily x7 days #7 r0.    Virginia Crews, MD, MPH [facility] 05/21/2018 1:03 PM

## 2018-05-21 NOTE — Telephone Encounter (Signed)
Patient's wife Everlene Farrier advised.

## 2018-06-25 DIAGNOSIS — M47816 Spondylosis without myelopathy or radiculopathy, lumbar region: Secondary | ICD-10-CM | POA: Diagnosis not present

## 2018-06-25 DIAGNOSIS — M5136 Other intervertebral disc degeneration, lumbar region: Secondary | ICD-10-CM | POA: Diagnosis not present

## 2018-06-25 DIAGNOSIS — M4726 Other spondylosis with radiculopathy, lumbar region: Secondary | ICD-10-CM | POA: Diagnosis not present

## 2018-06-25 DIAGNOSIS — M160 Bilateral primary osteoarthritis of hip: Secondary | ICD-10-CM | POA: Diagnosis not present

## 2018-06-25 DIAGNOSIS — M1612 Unilateral primary osteoarthritis, left hip: Secondary | ICD-10-CM | POA: Diagnosis not present

## 2018-06-25 DIAGNOSIS — M533 Sacrococcygeal disorders, not elsewhere classified: Secondary | ICD-10-CM | POA: Diagnosis not present

## 2018-06-25 DIAGNOSIS — M7060 Trochanteric bursitis, unspecified hip: Secondary | ICD-10-CM | POA: Diagnosis not present

## 2018-07-02 DIAGNOSIS — G8929 Other chronic pain: Secondary | ICD-10-CM | POA: Diagnosis not present

## 2018-07-02 DIAGNOSIS — M545 Low back pain: Secondary | ICD-10-CM | POA: Diagnosis not present

## 2018-07-06 ENCOUNTER — Encounter: Payer: Self-pay | Admitting: Podiatry

## 2018-07-06 ENCOUNTER — Ambulatory Visit: Payer: PPO | Admitting: Podiatry

## 2018-07-06 DIAGNOSIS — M19071 Primary osteoarthritis, right ankle and foot: Secondary | ICD-10-CM | POA: Diagnosis not present

## 2018-07-06 DIAGNOSIS — M7751 Other enthesopathy of right foot: Secondary | ICD-10-CM

## 2018-07-06 DIAGNOSIS — M19079 Primary osteoarthritis, unspecified ankle and foot: Secondary | ICD-10-CM

## 2018-07-06 DIAGNOSIS — M779 Enthesopathy, unspecified: Principal | ICD-10-CM

## 2018-07-06 DIAGNOSIS — M778 Other enthesopathies, not elsewhere classified: Secondary | ICD-10-CM

## 2018-07-06 NOTE — Progress Notes (Signed)
He presents today chief complaint of pain to the proximal first intermetatarsal space and the fourth intermetatarsal space on the right foot.  He states that X marks the spot he has already marked the foot with a sharpie marker as to where the pain is located.  Objective: Vital signs are stable he is alert and oriented x3.  Pulses are palpable.  He has pain on palpation to the first and fourth intermetatarsal areas proximally.  Assessment: Neuritis capsulitis.  Plan: Injected these areas today with 15 mg of Kenalog 5 mg Marcaine point maximal tenderness.  Tolerated procedure well without complications.

## 2018-07-08 DIAGNOSIS — M545 Low back pain: Secondary | ICD-10-CM | POA: Diagnosis not present

## 2018-07-08 DIAGNOSIS — G8929 Other chronic pain: Secondary | ICD-10-CM | POA: Diagnosis not present

## 2018-07-10 DIAGNOSIS — G8929 Other chronic pain: Secondary | ICD-10-CM | POA: Diagnosis not present

## 2018-07-10 DIAGNOSIS — M545 Low back pain: Secondary | ICD-10-CM | POA: Diagnosis not present

## 2018-07-15 DIAGNOSIS — G8929 Other chronic pain: Secondary | ICD-10-CM | POA: Diagnosis not present

## 2018-07-15 DIAGNOSIS — M545 Low back pain: Secondary | ICD-10-CM | POA: Diagnosis not present

## 2018-07-22 DIAGNOSIS — G8929 Other chronic pain: Secondary | ICD-10-CM | POA: Diagnosis not present

## 2018-07-22 DIAGNOSIS — M545 Low back pain: Secondary | ICD-10-CM | POA: Diagnosis not present

## 2018-07-28 DIAGNOSIS — H401132 Primary open-angle glaucoma, bilateral, moderate stage: Secondary | ICD-10-CM | POA: Diagnosis not present

## 2018-07-28 DIAGNOSIS — H01003 Unspecified blepharitis right eye, unspecified eyelid: Secondary | ICD-10-CM | POA: Diagnosis not present

## 2018-07-30 DIAGNOSIS — L80 Vitiligo: Secondary | ICD-10-CM | POA: Diagnosis not present

## 2018-07-30 DIAGNOSIS — Z1283 Encounter for screening for malignant neoplasm of skin: Secondary | ICD-10-CM | POA: Diagnosis not present

## 2018-07-30 DIAGNOSIS — D485 Neoplasm of uncertain behavior of skin: Secondary | ICD-10-CM | POA: Diagnosis not present

## 2018-07-30 DIAGNOSIS — D225 Melanocytic nevi of trunk: Secondary | ICD-10-CM | POA: Diagnosis not present

## 2018-07-30 DIAGNOSIS — D1800 Hemangioma unspecified site: Secondary | ICD-10-CM | POA: Diagnosis not present

## 2018-07-30 DIAGNOSIS — L821 Other seborrheic keratosis: Secondary | ICD-10-CM | POA: Diagnosis not present

## 2018-07-30 DIAGNOSIS — L570 Actinic keratosis: Secondary | ICD-10-CM | POA: Diagnosis not present

## 2018-07-30 DIAGNOSIS — I788 Other diseases of capillaries: Secondary | ICD-10-CM | POA: Diagnosis not present

## 2018-07-30 DIAGNOSIS — D692 Other nonthrombocytopenic purpura: Secondary | ICD-10-CM | POA: Diagnosis not present

## 2018-07-30 DIAGNOSIS — Z85828 Personal history of other malignant neoplasm of skin: Secondary | ICD-10-CM | POA: Diagnosis not present

## 2018-07-30 DIAGNOSIS — L578 Other skin changes due to chronic exposure to nonionizing radiation: Secondary | ICD-10-CM | POA: Diagnosis not present

## 2018-07-30 DIAGNOSIS — L308 Other specified dermatitis: Secondary | ICD-10-CM | POA: Diagnosis not present

## 2018-08-04 DIAGNOSIS — H401132 Primary open-angle glaucoma, bilateral, moderate stage: Secondary | ICD-10-CM | POA: Diagnosis not present

## 2018-08-10 DIAGNOSIS — I8290 Acute embolism and thrombosis of unspecified vein: Secondary | ICD-10-CM | POA: Diagnosis not present

## 2018-08-31 ENCOUNTER — Other Ambulatory Visit: Payer: Self-pay | Admitting: Family Medicine

## 2018-08-31 DIAGNOSIS — E785 Hyperlipidemia, unspecified: Secondary | ICD-10-CM

## 2018-09-03 ENCOUNTER — Other Ambulatory Visit: Payer: Self-pay | Admitting: Family Medicine

## 2018-09-03 DIAGNOSIS — E785 Hyperlipidemia, unspecified: Secondary | ICD-10-CM

## 2018-09-15 ENCOUNTER — Other Ambulatory Visit: Payer: Self-pay | Admitting: Family Medicine

## 2018-09-15 DIAGNOSIS — I479 Paroxysmal tachycardia, unspecified: Secondary | ICD-10-CM

## 2018-09-28 ENCOUNTER — Other Ambulatory Visit: Payer: Self-pay | Admitting: Family Medicine

## 2018-10-02 ENCOUNTER — Telehealth: Payer: Self-pay

## 2018-10-02 NOTE — Telephone Encounter (Signed)
Patient requesting call back from you to discuss nurse visit on 12/07/18. KW

## 2018-10-06 NOTE — Telephone Encounter (Signed)
Pt states whoever he spoke with yesterday told him he could have his CPE on 11/24/18. Apt was listed as an OV and it was before 365 days since last AWV. Advised pt and moved apt to 12/07/18 @ 10:20 (only open apt for that day). Pt was ok waiting in between AWV at 8:40 to CPE at 10:20 AM. -MM

## 2018-10-06 NOTE — Telephone Encounter (Signed)
Pt states he was calling about his physical not the AWV. Pt currently has

## 2018-10-07 ENCOUNTER — Ambulatory Visit: Payer: Self-pay | Admitting: Family Medicine

## 2018-11-12 IMAGING — MR MR FOOT*R* W/O CM
3 of 5 series · 9 of 40 positions shown · non-contrast
Comparison: Radiographs 11/01/2015 and 06/28/2011

CLINICAL DATA: 76-year-old with right midfoot pain for 6 months. No
acute injury or prior relevant surgery. Evaluate Lisfranc joint and
second metatarsal shaft.

EXAM:
MRI OF THE RIGHT FOREFOOT WITHOUT CONTRAST
TECHNIQUE: Multiplanar, multisequence MR imaging of the right forefoot was
performed. No intravenous contrast was administered.

[Series 4: PD fat-sat · coronal · right · 4.0mm · 0.20mm/px · 3 of 34 slices shown]
[im 5/34]
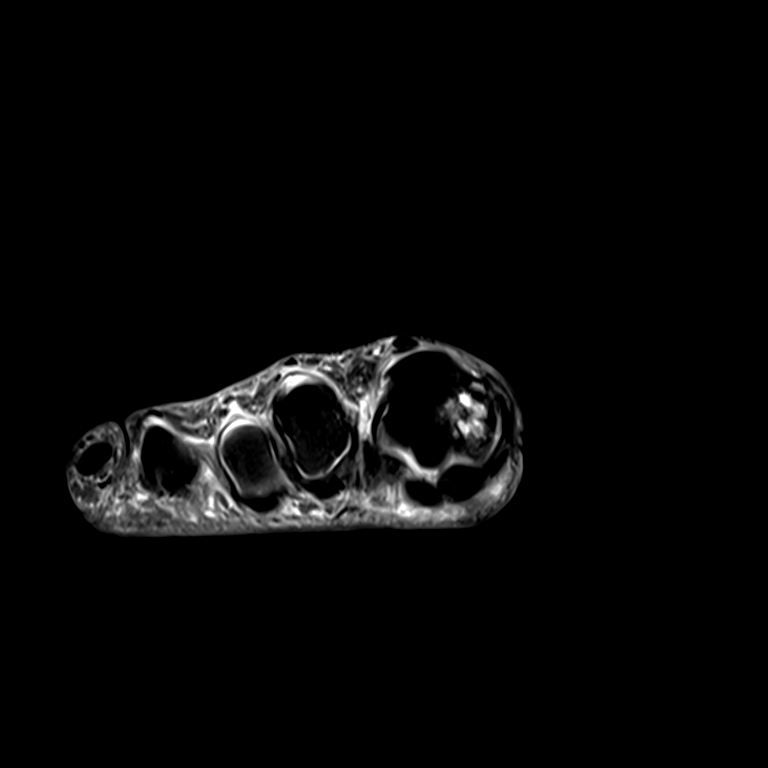
[im 19/34]
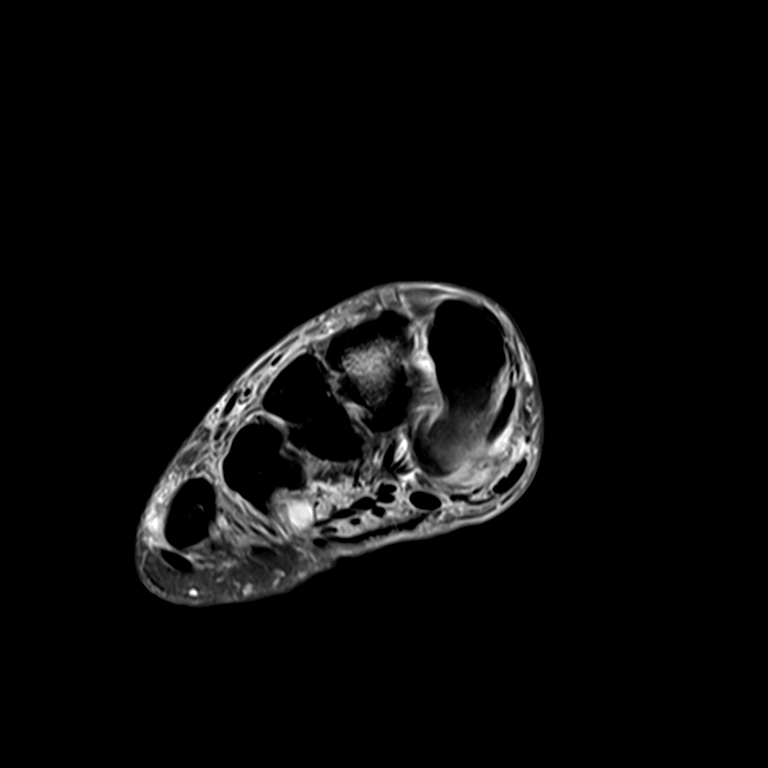
[im 29/34]
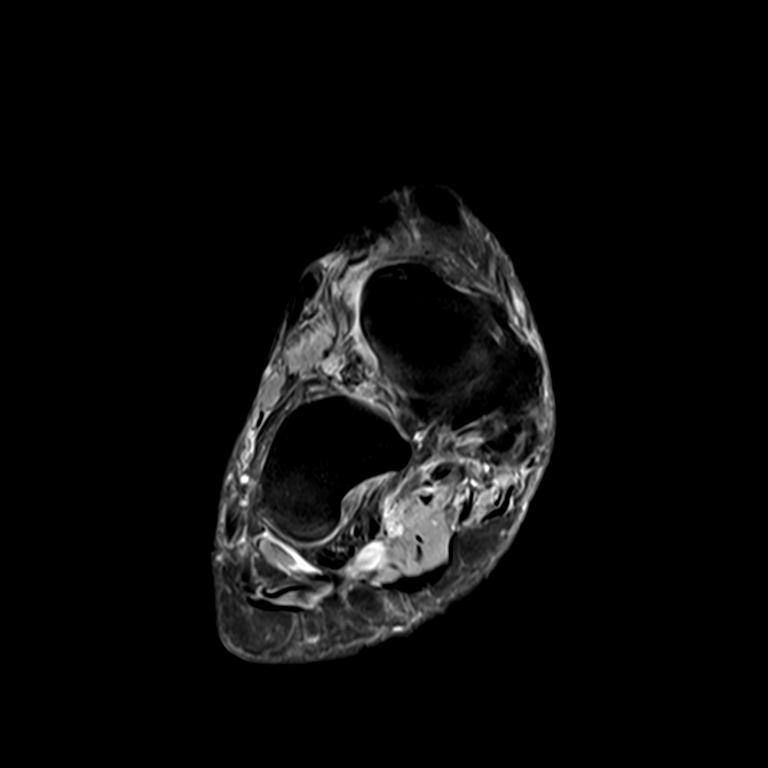

[Series 5: T2 fat-sat · coronal · right · 4.0mm · 0.20mm/px · 3 of 34 slices shown (1 of 2)]
[im 5/34]
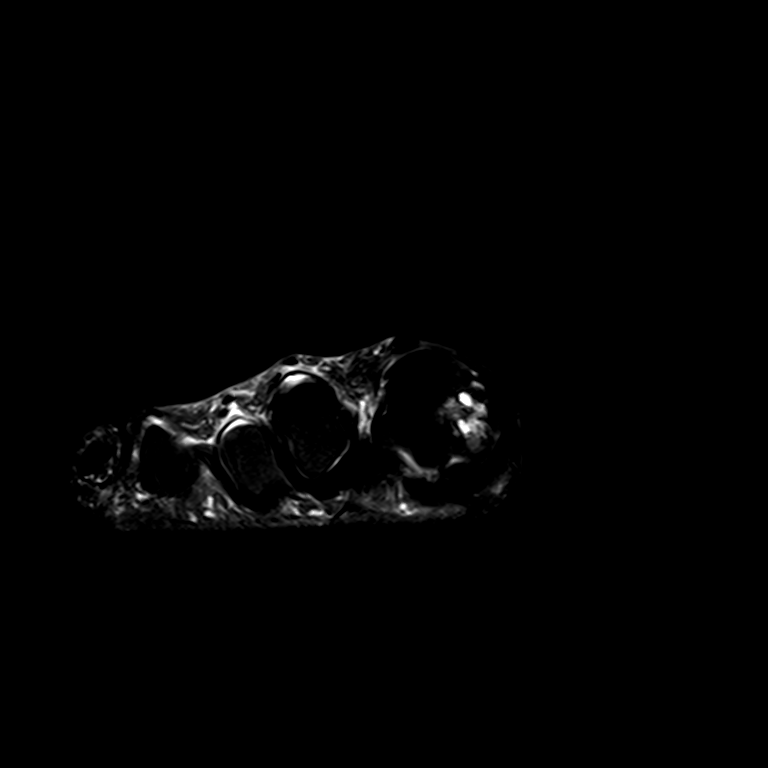
[im 17/34]
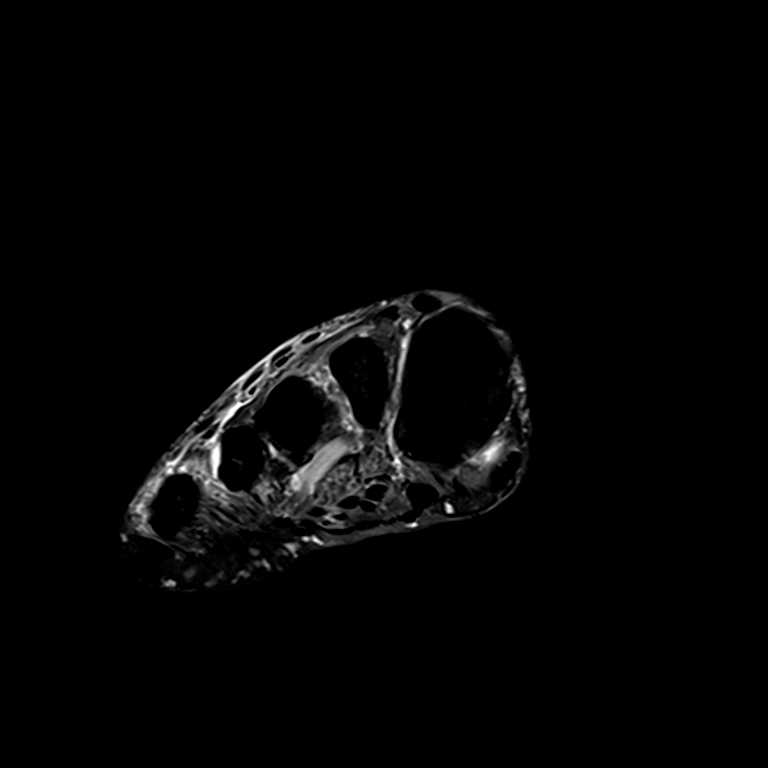
[im 29/34]
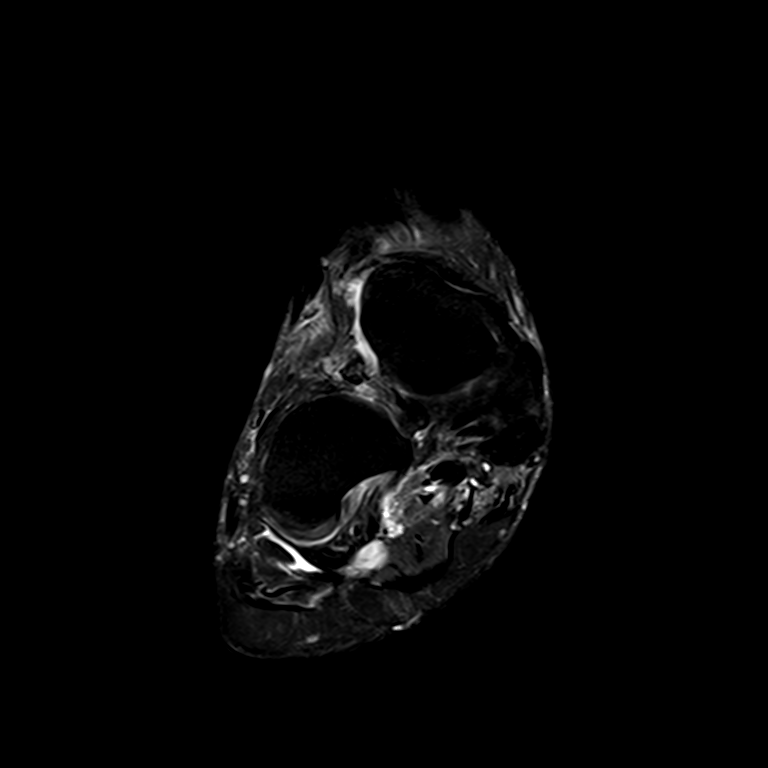

[Series 9: T2 fat-sat · axial · right · 3.5mm · 0.23mm/px · z∈[-135,-66]mm · 3 of 20 slices shown (2 of 2)]
[im 1/20]
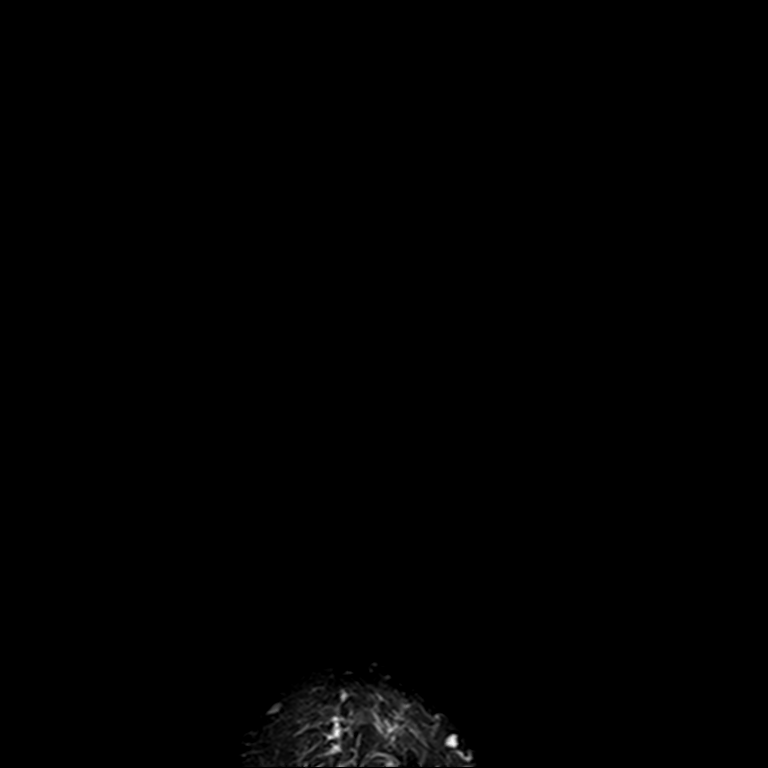
[im 10/20]
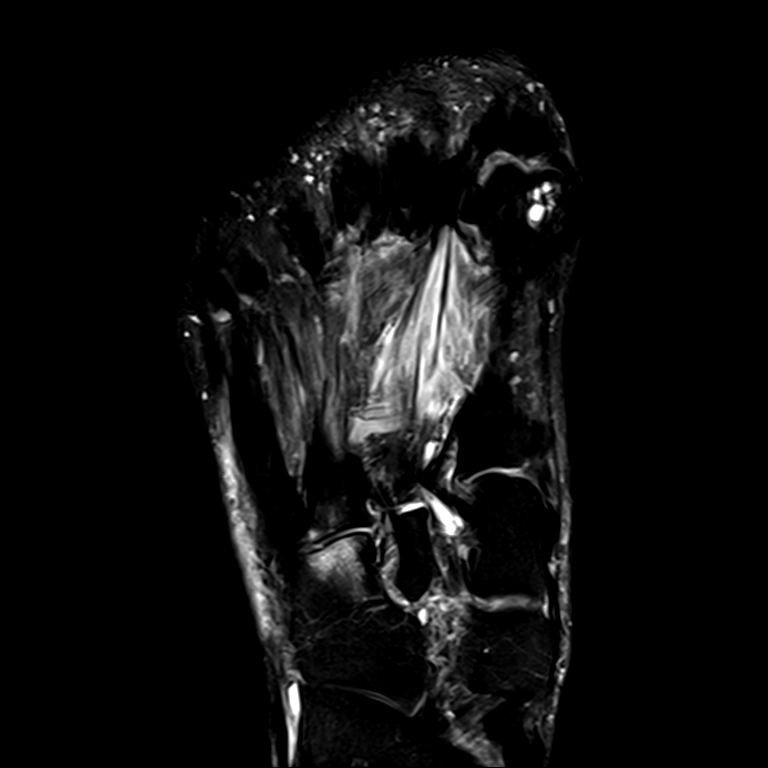
[im 20/20]
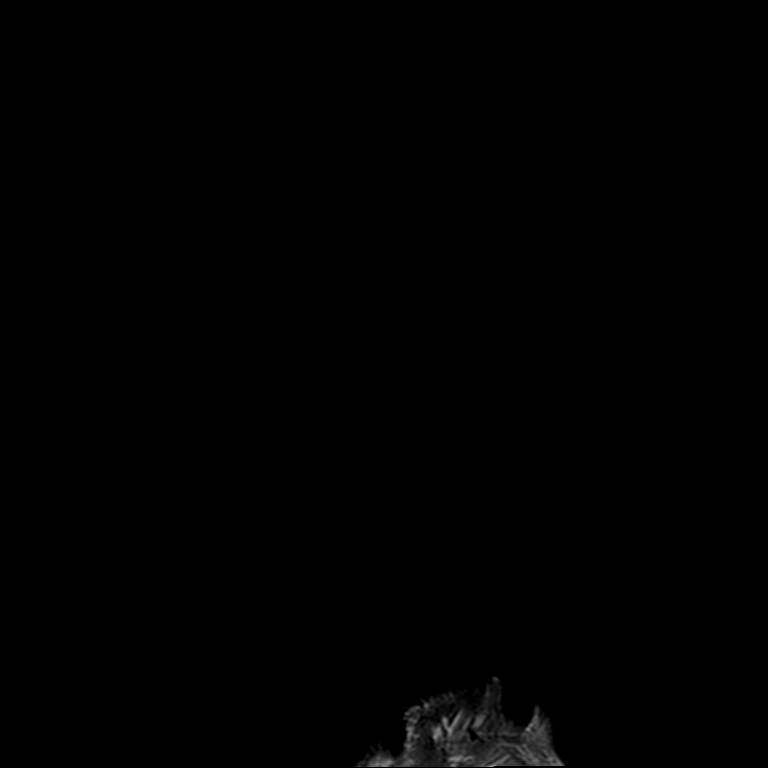

[9 of 40 positions shown; findings below may reference images not displayed]

FINDINGS: Bones/Joint/Cartilage

No acute osseous findings are demonstrated. The second metatarsal
shaft is intact. Moderate degenerative changes are present at the
Lisfranc joint with osteophytes and subchondral edema, greatest in
the second and fourth metatarsal bases, middle and lateral
cuneiforms and the cuboid. The Chopart joint appears normal. There
are mild degenerative changes of the first metatarsal phalangeal
joint with intraosseous ganglia in the plantar aspect of the first
metatarsal head. No significant joint effusions.

Ligaments

The Lisfranc ligament is intact. The alignment is normal at the
Lisfranc joint.

Muscles and Tendons

There is T2 hyperintensity within the interosseous musculature of
the forefoot. This is greatest medially, plantar to the second
metatarsal. No tendon rupture identified. The visualized ankle
tendons are intact.

Soft tissues

In addition to forefoot muscular edema, there is mild dorsal
subcutaneous edema. No focal fluid collections are seen.
IMPRESSION: 1. Moderate degenerative changes at the Lisfranc joint. The Lisfranc
ligament is intact and there is no associated subluxation.
2. Mild degenerative changes of the first metatarsal phalangeal
joint.
3. Nonspecific forefoot muscular T2 hyperintensity may reflect
myositis or muscular strain. No tendon rupture identified.

## 2018-11-13 DIAGNOSIS — R05 Cough: Secondary | ICD-10-CM | POA: Diagnosis not present

## 2018-11-13 DIAGNOSIS — R918 Other nonspecific abnormal finding of lung field: Secondary | ICD-10-CM | POA: Diagnosis not present

## 2018-11-13 DIAGNOSIS — J90 Pleural effusion, not elsewhere classified: Secondary | ICD-10-CM | POA: Diagnosis not present

## 2018-11-13 DIAGNOSIS — I25119 Atherosclerotic heart disease of native coronary artery with unspecified angina pectoris: Secondary | ICD-10-CM | POA: Diagnosis not present

## 2018-11-13 DIAGNOSIS — J8489 Other specified interstitial pulmonary diseases: Secondary | ICD-10-CM | POA: Diagnosis not present

## 2018-11-17 ENCOUNTER — Ambulatory Visit: Payer: Self-pay | Admitting: Family Medicine

## 2018-11-24 ENCOUNTER — Ambulatory Visit: Payer: Self-pay | Admitting: Family Medicine

## 2018-12-02 ENCOUNTER — Telehealth: Payer: Self-pay | Admitting: Family Medicine

## 2018-12-02 NOTE — Chronic Care Management (AMB) (Signed)
Chronic Care Management   Note  12/02/2018 Name: CHANOCH MCCLEERY MRN: [id number] DOB: May 25, 1941  [name] is a 78 y.o. year old male who is a primary care patient of Jerrol Banana., MD. I reached out to [name] by phone today in response to a referral sent by Mr. Bertin Inabinet [facility] health plan.    Mr. Stump was given information about Chronic Care Management services today including:  1. CCM service includes personalized support from designated clinical staff supervised by his physician, including individualized plan of care and coordination with other care providers 2. 24/7 contact phone numbers for assistance for urgent and routine care needs. 3. Service will only be billed when office clinical staff spend 20 minutes or more in a month to coordinate care. 4. Only one practitioner may furnish and bill the service in a calendar month. 5. The patient may stop CCM services at any time (effective at the end of the month) by phone call to the office staff. 6. The patient will be responsible for cost sharing (co-pay) of up to 20% of the service fee (after annual deductible is met).  Patient agreed to services and verbal consent obtained.   Follow up plan: Telephone appointment with CCM team member scheduled for: 12/03/2018  [place]  ??[email or web address]   ??[phone]

## 2018-12-03 ENCOUNTER — Ambulatory Visit: Payer: PPO | Admitting: Pharmacist

## 2018-12-03 DIAGNOSIS — I10 Essential (primary) hypertension: Secondary | ICD-10-CM

## 2018-12-03 DIAGNOSIS — R5383 Other fatigue: Secondary | ICD-10-CM

## 2018-12-03 DIAGNOSIS — R351 Nocturia: Secondary | ICD-10-CM

## 2018-12-03 DIAGNOSIS — N401 Enlarged prostate with lower urinary tract symptoms: Secondary | ICD-10-CM

## 2018-12-07 ENCOUNTER — Ambulatory Visit (INDEPENDENT_AMBULATORY_CARE_PROVIDER_SITE_OTHER): Payer: PPO | Admitting: Family Medicine

## 2018-12-07 ENCOUNTER — Ambulatory Visit (INDEPENDENT_AMBULATORY_CARE_PROVIDER_SITE_OTHER): Payer: PPO

## 2018-12-07 ENCOUNTER — Other Ambulatory Visit: Payer: Self-pay

## 2018-12-07 ENCOUNTER — Encounter: Payer: Self-pay | Admitting: Family Medicine

## 2018-12-07 VITALS — BP 128/78 | HR 81 | Temp 98.3°F | Wt 160.2 lb

## 2018-12-07 DIAGNOSIS — Z0000 Encounter for general adult medical examination without abnormal findings: Secondary | ICD-10-CM

## 2018-12-07 DIAGNOSIS — N401 Enlarged prostate with lower urinary tract symptoms: Secondary | ICD-10-CM

## 2018-12-07 DIAGNOSIS — E785 Hyperlipidemia, unspecified: Secondary | ICD-10-CM

## 2018-12-07 DIAGNOSIS — J449 Chronic obstructive pulmonary disease, unspecified: Secondary | ICD-10-CM | POA: Diagnosis not present

## 2018-12-07 DIAGNOSIS — M791 Myalgia, unspecified site: Secondary | ICD-10-CM | POA: Diagnosis not present

## 2018-12-07 DIAGNOSIS — I10 Essential (primary) hypertension: Secondary | ICD-10-CM | POA: Diagnosis not present

## 2018-12-07 DIAGNOSIS — R351 Nocturia: Secondary | ICD-10-CM | POA: Diagnosis not present

## 2018-12-07 NOTE — Progress Notes (Signed)
Patient: [name], Male    DOB: 1940-08-09, 78 y.o.   MRN: [id number] Visit Date: 12/07/2018  Today's Provider: Wilhemena Durie, MD   Chief Complaint  Patient presents with  . Annual Exam   Subjective:   Had AWE w/ Mckenzie today 12/07/2018.  Complete Physical [name] is a 78 y.o. male. He feels well. He reports exercising . He reports he is sleeping well. He overall feels well. He had a tick on his right buttocks It did not appear to be attached for long.  -----------------------------------------------------------   Review of Systems  Constitutional: Negative.   HENT: Negative.   Eyes: Negative.   Respiratory: Positive for shortness of breath and wheezing.   Cardiovascular: Positive for leg swelling.  Gastrointestinal: Negative.   Endocrine: Positive for polyuria.  Genitourinary: Positive for frequency.       Nocturia times 4-5 at night.  Musculoskeletal: Negative.   Skin: Negative.   Allergic/Immunologic: Negative.   Neurological: Negative.   Hematological: Negative.   Psychiatric/Behavioral: Negative.     Social History   Socioeconomic History  . Marital status: Married    Spouse name: Everlene Farrier  . Number of children: 2  . Years of education: College  . Highest education level: Bachelor's degree (e.g., BA, AB, BS)  Occupational History  . Occupation: Retired    Fish farm manager: RETIRED    CommentBiomedical scientist  Social Needs  . Financial resource strain: Not hard at all  . Food insecurity    Worry: Never true    Inability: Never true  . Transportation needs    Medical: No    Non-medical: No  Tobacco Use  . Smoking status: Former Smoker    Packs/day: 0.50    Years: 3.00    Pack years: 1.50    Types: Cigarettes    Quit date: 06/17/1958    Years since quitting: 60.5  . Smokeless tobacco: Never Used  . Tobacco comment: smoking while in college  Substance and Sexual Activity  . Alcohol use: Yes    Alcohol/week: 5.0 standard drinks    Types: 5  Glasses of wine per week  . Drug use: No  . Sexual activity: Yes  Lifestyle  . Physical activity    Days per week: 0 days    Minutes per session: 0 min  . Stress: Not at all  Relationships  . Social Herbalist on phone: Patient refused    Gets together: Patient refused    Attends religious service: Patient refused    Active member of club or organization: Patient refused    Attends meetings of clubs or organizations: Patient refused    Relationship status: Patient refused  . Intimate partner violence    Fear of current or ex partner: Patient refused    Emotionally abused: Patient refused    Physically abused: Patient refused    Forced sexual activity: Patient refused  Other Topics Concern  . Not on file  Social History Narrative   He is a married father of 2 daughters.   He smoked casually while in college for about 3 years less than a pack a day.   He drinks 3-4 glass of wine at night. He also has about 2 servings of coffee a day   He is a very active gentleman with Silver Sneakers exercise program at least 3-4 days a week.   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  In addition to this standing his exercise, he does lots of gardening and uses a chain saw to cut wood.             Past Medical History:  Diagnosis Date  . AB (asthmatic bronchitis)   . Allergic rhinitis   . Arthropathy   . Asthma   . Cellulitis   . DVT (deep venous thrombosis) ([place])   . Foreign body in skin   . Glaucoma   . Hx MRSA infection   . Hx MRSA infection   . Hypertension   . Insomnia   . Insomnia   . Mitral valve disorder   . Osteoarthrosis   . Upper back strain      Patient Active Problem List   Diagnosis Date Noted  . BPH associated with nocturia 01/15/2018  . SOB (shortness of breath) 08/13/2017  . Sepsis ([place]) 11/05/2016  . Diverticulitis 11/05/2016  . History of deep vein thrombosis (DVT) of lower extremity 11/05/2016  . HCAP  (healthcare-associated pneumonia) 08/29/2016  . NSTEMI (non-ST elevated myocardial infarction) ([place]) 08/18/2016  . Chronic obstructive pulmonary disease ([place]) 01/02/2016  . DVT, femoral, acute ([place]) 05/15/2015  . Recurrent pneumonia 12/21/2014  . Basal cell carcinoma 10/19/2014  . Abnormal liver enzymes 10/19/2014  . Personal history of methicillin resistant Staphylococcus aureus 10/19/2014  . Cannot sleep 10/19/2014  . Heel pain 10/19/2014  . Pain in shoulder 10/19/2014  . Back strain 10/19/2014  . Pulmonary aspergillosis, allergic bronchopulmonary type ([place]) 07/30/2014  . ABPA (allergic bronchopulmonary aspergillosis) ([place]) 07/30/2014  . CAP (community acquired pneumonia) 05/03/2014  . Abnormal CXR 04/01/2014  . Paroxysmal tachycardia ([place]) 06/23/2013  . Asymptomatic PVCs 06/23/2013  . Beat, premature ventricular 06/23/2013  . Cough 10/19/2012  . Allergic rhinitis 01/17/2009  . Asthma, chronic obstructive, without status asthmaticus ([place]) 11/04/2008  . CAFL (chronic airflow limitation) ([place]) 11/04/2008  . Essential hypertension 02/09/2008  . Arthropathia 08/28/2007  . Disorder of mitral valve 07/08/2005  . Benign prostatic hyperplasia without urinary obstruction 05/16/1997  . LBP (low back pain) 05/16/1997  . Degenerative arthritis of hip 03/18/1995    Past Surgical History:  Procedure Laterality Date  . APPENDECTOMY    . BRONCHOSCOPY    . LEFT HEART CATH AND CORONARY ANGIOGRAPHY N/A 08/19/2016   Procedure: Left Heart Cath and Coronary Angiography;  Surgeon: Dionisio David, MD;  Location: [facility];  Service: Cardiovascular;  Laterality: N/A;  . NASAL SINUS SURGERY  09/27/2014    His family history includes Alzheimer's disease in his father; Brain cancer in his daughter; Breast cancer in his sister; Heart failure in his mother; Pancreatic cancer in his sister.   Current Outpatient Medications:  .  ADVAIR HFA 595-63 MCG/ACT inhaler, Inhale 2 puffs into the lungs  2 (two) times daily. , Disp: , Rfl:  .  albuterol (PROVENTIL HFA;VENTOLIN HFA) 108 (90 Base) MCG/ACT inhaler, Inhale into the lungs every 6 (six) hours as needed for wheezing or shortness of breath., Disp: , Rfl:  .  atorvastatin (LIPITOR) 40 MG tablet, TAKE 1 TABLET BY MOUTH DAILY AT 6PM, Disp: 90 tablet, Rfl: 3 .  azithromycin (ZITHROMAX) 250 MG tablet, Take 1 tablet by mouth daily., Disp: , Rfl:  .  brimonidine (ALPHAGAN) 0.15 % ophthalmic solution, Place 1 drop into both eyes 2 (two) times daily. , Disp: , Rfl:  .  diltiazem (CARDIZEM CD) 180 MG 24 hr capsule, TAKE 1 CAPSULE BY MOUTH DAILY, Disp: 90 capsule, Rfl: 3 .  dorzolamide (TRUSOPT)  2 % ophthalmic solution, Place 1 drop into both eyes 2 (two) times daily. , Disp: , Rfl:  .  fluticasone (FLONASE) 50 MCG/ACT nasal spray, Place 2 sprays into both nostrils 2 (two) times daily. , Disp: , Rfl:  .  gabapentin (NEURONTIN) 400 MG capsule, Take 400 mg by mouth at bedtime., Disp: , Rfl:  .  latanoprost (XALATAN) 0.005 % ophthalmic solution, Place 1 drop into both eyes at bedtime. , Disp: , Rfl:  .  Melatonin 5 MG TABS, Take 1 tablet by mouth at bedtime., Disp: , Rfl:  .  montelukast (SINGULAIR) 10 MG tablet, Take 10 mg by mouth at bedtime. , Disp: , Rfl:  .  omeprazole (PRILOSEC) 20 MG capsule, TAKE 1 CAPSULE BY MOUTH DAILY (Patient taking differently: Take 20 mg by mouth daily as needed. ), Disp: 90 capsule, Rfl: 3 .  tamsulosin (FLOMAX) 0.4 MG CAPS capsule, Take 1 capsule (0.4 mg total) by mouth daily., Disp: 30 capsule, Rfl: 11 .  tiotropium (SPIRIVA HANDIHALER) 18 MCG inhalation capsule, Place 18 mcg into inhaler and inhale daily. , Disp: , Rfl:  .  triamcinolone ointment (KENALOG) 0.1 %, Apply 1 application topically as directed. To cracks on fingers as needed, Disp: , Rfl:  .  XARELTO 10 MG TABS tablet, Take 10 mg by mouth daily. , Disp: , Rfl:  .  isosorbide mononitrate (IMDUR) 30 MG 24 hr tablet, Take 1 tablet (30 mg total) by mouth daily.  (Patient not taking: Reported on 12/07/2018), Disp: 90 tablet, Rfl: 3  Patient Care Team: Jerrol Banana., MD as PCP - General (Family Medicine) Leandrew Koyanagi, MD as Referring Physician (Ophthalmology) Robyne Askew, MD as Referring Physician (Internal Medicine) Dory Horn, MD as Referring Physician (Otolaryngology) Ubaldo Glassing Javier Docker, MD as Consulting Physician (Cardiology) Cathi Roan, [facility] (Pharmacist) Terril, Romilda Garret, DPM as Consulting Physician (Podiatry) Bjorn Loser, MD as Consulting Physician (Urology)     Objective:    Vitals: BP 128/78 (BP Location: Left Arm, Patient Position: Sitting, Cuff Size: Normal)   Pulse 81   Temp 98.3 F (36.8 C) (Oral)   Wt 160 lb 3.2 oz (72.7 kg)   SpO2 96%   BMI 20.57 kg/m   Physical Exam  Activities of Daily Living In your present state of health, do you have any difficulty performing the following activities: 12/07/2018  Hearing? N  Vision? N  Comment Wears eye glasses daily.  Difficulty concentrating or making decisions? N  Walking or climbing stairs? Y  Comment Due to SOB.  Dressing or bathing? N  Doing errands, shopping? N  Preparing Food and eating ? N  Using the Toilet? N  In the past six months, have you accidently leaked urine? N  Do you have problems with loss of bowel control? N  Managing your Medications? N  Managing your Finances? N  Housekeeping or managing your Housekeeping? N  Some recent data might be hidden    Fall Risk Assessment Fall Risk  12/07/2018 12/02/2017 01/14/2017 12/31/2016 01/02/2016  Falls in the past year? 0 No No No No     Depression Screen PHQ 2/9 Scores 12/07/2018 12/02/2017 01/14/2017 12/31/2016  PHQ - 2 Score 0 0 0 0  PHQ- 9 Score - - - 0    6CIT Screen 12/07/2018  What Year? 0 points  What month? 0 points  What time? 0 points  Count back from 20 2 points  Months in reverse 0 points  Repeat phrase 0 points  Total Score 2       Assessment & Plan:  I, Porsha  McClurkin CMA, am acting as a scribe for Wilhemena Durie., MD.    Annual Physical Reviewed patient's Family Medical History Reviewed and updated list of patient's medical providers Assessment of cognitive impairment was done Assessed patient's functional ability Established a written schedule for health screening [place] Completed and Reviewed  Exercise Activities and Dietary recommendations Goals    . DIET - REDUCE PORTION SIZE     Recommend eating 3 small meals a day with 2 healthy snacks in between.    . Exercise 3x per week (30 min per time)     Recommend to start back walking 3 days a week for at least 30 minutes at a time.     . Increase water intake     Continue drinking 8 glasses of water a day.        Immunization History  Administered Date(s) Administered  . Influenza Split 03/02/2012, 03/17/2013, 05/07/2014  . Influenza Whole 03/24/2007, 03/15/2009, 02/15/2010  . Influenza, High Dose Seasonal PF 05/04/2015, 03/21/2016, 03/24/2017  . Influenza-Unspecified 02/23/2018  . Pneumococcal Conjugate-13 11/17/2013  . Pneumococcal Polysaccharide-23 11/19/2004, 03/19/2007, 03/17/2009  . Td 05/16/1997, 05/14/2007, 09/11/2010  . Zoster 01/02/2011  . Zoster Recombinat (Shingrix) 02/23/2018, 04/28/2018    Health Maintenance  Topic Date Due  . INFLUENZA VACCINE  01/16/2019  . TETANUS/TDAP  09/10/2020  . PNA vac Low Risk Adult  Completed     Discussed health benefits of physical activity, and encouraged him to engage in regular exercise appropriate for his age and condition.   1. Annual physical exam   2. Medicare annual wellness visit, subsequent   3. Essential hypertension  - Comprehensive metabolic panel  4. BPH associated with nocturia Add finesteride. - PSA  5. Myalgia Mild. - CBC with Differential/Platelet - CK  6. Hyperlipidemia, unspecified hyperlipidemia type  - Lipid panel - TSH 7.Asthma/COPD 8.Tick Bite No  intervention necessary. ------------------------------------------------------------------------------------------------------------    Wilhemena Durie, MD  [facility]

## 2018-12-07 NOTE — Progress Notes (Signed)
Subjective:   [name] is a 78 y.o. male who presents for Medicare Annual/Subsequent preventive examination.    This visit is being conducted through telemedicine due to the COVID-19 pandemic. This patient has given me verbal consent via doximity to conduct this visit, patient states they are participating from their home address. Some vital signs may be absent or patient reported.    Patient identification: identified by name, DOB, and current address  Review of Systems:  N/A  Cardiac Risk Factors include: advanced age (>66men, >35 women);dyslipidemia;hypertension;male gender     Objective:    Vitals: There were no vitals taken for this visit.  There is no height or weight on file to calculate BMI. Unable to obtain vitals due to visit being conducted via telephonically.   Advanced Directives 12/07/2018 04/29/2018 12/02/2017 08/13/2017 08/13/2017 04/12/2017 01/14/2017  Does Patient Have a Medical Advance Directive? Yes No Yes Yes Yes Yes Yes  Type of Paramedic of [place];Living will - [place];Living will [place];Living will [place];Living will Living will;Healthcare Power of [place];Living will  Does patient want to make changes to medical advance directive? - - - No - Patient declined No - Patient declined - -  Copy of [place] in Chart? Yes - validated most recent copy scanned in chart (See row information) - Yes No - copy requested No - copy requested - -  Would patient like information on creating a medical advance directive? - No - Patient declined - - - No - Patient declined -    Tobacco Social History   Tobacco Use  Smoking Status Former Smoker  . Packs/day: 0.50  . Years: 3.00  . Pack years: 1.50  . Types: Cigarettes  . Quit date: 06/17/1958  . Years since quitting: 60.5  Smokeless Tobacco Never Used  Tobacco Comment   smoking  while in college     Counseling given: Not Answered Comment: smoking while in college   Clinical Intake:  Pre-visit preparation completed: Yes  Pain Score: 0-No pain     Nutritional Risks: None Diabetes: No  How often do you need to have someone help you when you read instructions, pamphlets, or other written materials from your doctor or pharmacy?: 1 - Never  Interpreter Needed?: No  Information entered by :: [facility], LPN  Past Medical History:  Diagnosis Date  . AB (asthmatic bronchitis)   . Allergic rhinitis   . Arthropathy   . Asthma   . Cellulitis   . DVT (deep venous thrombosis) ([place])   . Foreign body in skin   . Glaucoma   . Hx MRSA infection   . Hx MRSA infection   . Hypertension   . Insomnia   . Insomnia   . Mitral valve disorder   . Osteoarthrosis   . Upper back strain    Past Surgical History:  Procedure Laterality Date  . APPENDECTOMY    . BRONCHOSCOPY    . LEFT HEART CATH AND CORONARY ANGIOGRAPHY N/A 08/19/2016   Procedure: Left Heart Cath and Coronary Angiography;  Surgeon: Dionisio David, MD;  Location: [facility];  Service: Cardiovascular;  Laterality: N/A;  . NASAL SINUS SURGERY  09/27/2014   Family History  Problem Relation Age of Onset  . Breast cancer Sister   . Heart failure Mother   . Pancreatic cancer Sister   . Alzheimer's disease Father   . Brain cancer  Daughter    Social History   Socioeconomic History  . Marital status: Married    Spouse name: Everlene Farrier  . Number of children: 2  . Years of education: College  . Highest education level: Bachelor's degree (e.g., BA, AB, BS)  Occupational History  . Occupation: Retired    Fish farm manager: RETIRED    CommentBiomedical scientist  Social Needs  . Financial resource strain: Not hard at all  . Food insecurity    Worry: Never true    Inability: Never true  . Transportation needs    Medical: No    Non-medical: No  Tobacco Use  . Smoking status: Former Smoker    Packs/day: 0.50     Years: 3.00    Pack years: 1.50    Types: Cigarettes    Quit date: 06/17/1958    Years since quitting: 60.5  . Smokeless tobacco: Never Used  . Tobacco comment: smoking while in college  Substance and Sexual Activity  . Alcohol use: Yes    Alcohol/week: 5.0 standard drinks    Types: 5 Glasses of wine per week  . Drug use: No  . Sexual activity: Yes  Lifestyle  . Physical activity    Days per week: 0 days    Minutes per session: 0 min  . Stress: Not at all  Relationships  . Social Herbalist on phone: Patient refused    Gets together: Patient refused    Attends religious service: Patient refused    Active member of club or organization: Patient refused    Attends meetings of clubs or organizations: Patient refused    Relationship status: Patient refused  Other Topics Concern  . Not on file  Social History Narrative   He is a married father of 2 daughters.   He smoked casually while in college for about 3 years less than a pack a day.   He drinks 3-4 glass of wine at night. He also has about 2 servings of coffee a day   He is a very active gentleman with Silver Sneakers exercise program at least 3-4 days a week.   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   In addition to this standing his exercise, he does lots of gardening and uses a chain saw to cut wood.             Outpatient Encounter Medications as of 12/07/2018  Medication Sig  . ADVAIR HFA 230-21 MCG/ACT inhaler Inhale 2 puffs into the lungs 2 (two) times daily.   Marland Kitchen albuterol (PROVENTIL HFA;VENTOLIN HFA) 108 (90 Base) MCG/ACT inhaler Inhale into the lungs every 6 (six) hours as needed for wheezing or shortness of breath.  Marland Kitchen atorvastatin (LIPITOR) 40 MG tablet TAKE 1 TABLET BY MOUTH DAILY AT 6PM  . azithromycin (ZITHROMAX) 250 MG tablet Take 1 tablet by mouth daily.  . brimonidine (ALPHAGAN) 0.15 % ophthalmic solution Place 1 drop into both eyes 2 (two) times daily.   Marland Kitchen diltiazem  (CARDIZEM CD) 180 MG 24 hr capsule TAKE 1 CAPSULE BY MOUTH DAILY  . dorzolamide (TRUSOPT) 2 % ophthalmic solution Place 1 drop into both eyes 2 (two) times daily.   . fluticasone (FLONASE) 50 MCG/ACT nasal spray Place 2 sprays into both nostrils 2 (two) times daily.   Marland Kitchen gabapentin (NEURONTIN) 400 MG capsule Take 400 mg by mouth at bedtime.  Marland Kitchen latanoprost (XALATAN) 0.005 % ophthalmic solution Place 1 drop into both eyes at bedtime.   Marland Kitchen  Melatonin 5 MG TABS Take 1 tablet by mouth at bedtime.  . montelukast (SINGULAIR) 10 MG tablet Take 10 mg by mouth at bedtime.   Marland Kitchen omeprazole (PRILOSEC) 20 MG capsule TAKE 1 CAPSULE BY MOUTH DAILY (Patient taking differently: Take 20 mg by mouth daily as needed. )  . tamsulosin (FLOMAX) 0.4 MG CAPS capsule Take 1 capsule (0.4 mg total) by mouth daily.  Marland Kitchen tiotropium (SPIRIVA HANDIHALER) 18 MCG inhalation capsule Place 18 mcg into inhaler and inhale daily.   Marland Kitchen triamcinolone ointment (KENALOG) 0.1 % Apply 1 application topically as directed. To cracks on fingers as needed  . XARELTO 10 MG TABS tablet Take 10 mg by mouth daily.   . isosorbide mononitrate (IMDUR) 30 MG 24 hr tablet Take 1 tablet (30 mg total) by mouth daily. (Patient not taking: Reported on 12/07/2018)   No facility-administered encounter medications on file as of 12/07/2018.     Activities of Daily Living In your present state of health, do you have any difficulty performing the following activities: 12/07/2018  Hearing? N  Vision? N  Comment Wears eye glasses daily.  Difficulty concentrating or making decisions? N  Walking or climbing stairs? Y  Comment Due to SOB.  Dressing or bathing? N  Doing errands, shopping? N  Preparing Food and eating ? N  Using the Toilet? N  In the past six months, have you accidently leaked urine? N  Do you have problems with loss of bowel control? N  Managing your Medications? N  Managing your Finances? N  Housekeeping or managing your Housekeeping? N  Some  recent data might be hidden    Patient Care Team: Jerrol Banana., MD as PCP - General (Family Medicine) Leandrew Koyanagi, MD as Referring Physician (Ophthalmology) Robyne Askew, MD as Referring Physician (Internal Medicine) Dory Horn, MD as Referring Physician (Otolaryngology) Teodoro Spray, MD as Consulting Physician (Cardiology) Cathi Roan, [facility] (Pharmacist) Garrel Ridgel, DPM as Consulting Physician (Podiatry) Bjorn Loser, MD as Consulting Physician (Urology)   Assessment:   This is a routine wellness examination for Leroy.  Exercise Activities and Dietary recommendations Current Exercise Habits: The patient does not participate in regular exercise at present, Exercise limited by: None identified  Goals    . DIET - REDUCE PORTION SIZE     Recommend eating 3 small meals a day with 2 healthy snacks in between.    . Exercise 3x per week (30 min per time)     Recommend to start back walking 3 days a week for at least 30 minutes at a time.     . Increase water intake     Continue drinking 8 glasses of water a day.        Fall Risk: Fall Risk  12/07/2018 12/02/2017 01/14/2017 12/31/2016 01/02/2016  Falls in the past year? 0 No No No No    FALL RISK PREVENTION PERTAINING TO THE HOME:  Any stairs in or around the home? Yes  If so, are there any without handrails? No   Home free of loose throw rugs in walkways, pet beds, electrical cords, etc? Yes  Adequate lighting in your home to reduce risk of falls? Yes   ASSISTIVE DEVICES UTILIZED TO PREVENT FALLS:  Life alert? No  Use of a cane, walker or w/c? No  Grab bars in the bathroom? No  Shower chair or bench in shower? Yes  Elevated toilet seat or a handicapped toilet? No   TIMED UP AND  GO:  Was the test performed? No .    Depression Screen PHQ 2/9 Scores 12/07/2018 12/02/2017 01/14/2017 12/31/2016  PHQ - 2 Score 0 0 0 0  PHQ- 9 Score - - - 0    Cognitive Function     6CIT Screen  12/07/2018 12/31/2016  What Year? 0 points 0 points  What month? 0 points 0 points  What time? 0 points 0 points  Count back from 20 2 points 0 points  Months in reverse 0 points 0 points  Repeat phrase 0 points 4 points  Total Score 2 4    Immunization History  Administered Date(s) Administered  . Influenza Split 03/02/2012, 03/17/2013, 05/07/2014  . Influenza Whole 03/24/2007, 03/15/2009, 02/15/2010  . Influenza, High Dose Seasonal PF 05/04/2015, 03/21/2016, 03/24/2017  . Influenza-Unspecified 02/23/2018  . Pneumococcal Conjugate-13 11/17/2013  . Pneumococcal Polysaccharide-23 11/19/2004, 03/19/2007, 03/17/2009  . Td 05/16/1997, 05/14/2007, 09/11/2010  . Zoster 01/02/2011  . Zoster Recombinat (Shingrix) 02/23/2018, 04/28/2018    Qualifies for Shingles Vaccine? Completed series  Tdap: Up to date  Flu Vaccine: Up to date  Pneumococcal Vaccine: Completed series  Screening Tests Health Maintenance  Topic Date Due  . INFLUENZA VACCINE  01/16/2019  . TETANUS/TDAP  09/10/2020  . PNA vac Low Risk Adult  Completed   Cancer Screenings:  Colorectal Screening: No longer required.   Lung Cancer Screening: (Low Dose CT Chest recommended if Age 21-80 years, 30 pack-year currently smoking OR have quit w/in 15years.) does not qualify.   Additional Screening:  Vision Screening: Recommended annual ophthalmology exams for early detection of glaucoma and other disorders of the eye.  Dental Screening: Recommended annual dental exams for proper oral hygiene  Community Resource Referral:  CRR required this visit?  No        Plan:  I have personally reviewed and addressed the Medicare Annual Wellness questionnaire and have noted the following in the patient's chart:  A. Medical and social history B. Use of alcohol, tobacco or illicit drugs  C. Current medications and supplements D. Functional ability and status E.  Nutritional status F.  Physical activity G. Advance directives  H. List of other physicians I.  Hospitalizations, surgeries, and ER visits in previous 12 months [facility]  [place] such as hearing and vision if needed, cognitive and depression L. Referrals and appointments   In addition, I have reviewed and discussed with patient certain preventive protocols, quality metrics, and best practice recommendations. A written personalized care plan for preventive services as well as general preventive health recommendations were provided to patient.   Glendora Score, [place]  5/00/3704 Nurse Health Advisor   Nurse Notes: None.

## 2018-12-07 NOTE — Patient Instructions (Signed)
[name] , Thank you for taking time to come for your Medicare Wellness Visit. I appreciate your ongoing commitment to your health goals. Please review the following plan we discussed and let me know if I can assist you in the future.   Screening recommendations/referrals: Colonoscopy: No longer required.  Recommended yearly ophthalmology/optometry visit for glaucoma screening and checkup Recommended yearly dental visit for hygiene and checkup  Vaccinations: Influenza vaccine: Up to date Pneumococcal vaccine: Completed series Tdap vaccine: Up to date, due 09/10/2020 Shingles vaccine: Completed series    Advanced directives: Currently on file.   Conditions/risks identified: Recommend to start back walking for 3 days a week at least 30 minutes.   Next appointment: today at 10:20 with Dr Rosanna Randy  Preventive Care 80 Years and Older, Male Preventive care refers to lifestyle choices and visits with your health care provider that can promote health and wellness. What does preventive care include?  A yearly physical exam. This is also called an annual well check.  Dental exams once or twice a year.  Routine eye exams. Ask your health care provider how often you should have your eyes checked.  Personal lifestyle choices, including:  Daily care of your teeth and gums.  Regular physical activity.  Eating a healthy diet.  Avoiding tobacco and drug use.  Limiting alcohol use.  Practicing safe sex.  Taking low doses of aspirin every day.  Taking vitamin and mineral supplements as recommended by your health care provider. What happens during an annual well check? The services and screenings done by your health care provider during your annual well check will depend on your age, overall health, lifestyle risk factors, and family history of disease. Counseling  Your health care provider may ask you questions about your:  Alcohol use.  Tobacco use.  Drug use.  Emotional  well-being.  Home and relationship well-being.  Sexual activity.  Eating habits.  History of falls.  Memory and ability to understand (cognition).  Work and work Statistician. Screening  You may have the following tests or measurements:  Height, weight, and BMI.  Blood pressure.  Lipid and cholesterol levels. These may be checked every 5 years, or more frequently if you are over 7 years old.  Skin check.  Lung cancer screening. You may have this screening every year starting at age 69 if you have a 30-pack-year history of smoking and currently smoke or have quit within the past 15 years.  Fecal occult blood test (FOBT) of the stool. You may have this test every year starting at age 60.  Flexible sigmoidoscopy or colonoscopy. You may have a sigmoidoscopy every 5 years or a colonoscopy every 10 years starting at age 25.  Prostate cancer screening. Recommendations will vary depending on your family history and other risks.  Hepatitis C blood test.  Hepatitis B blood test.  Sexually transmitted disease (STD) testing.  Diabetes screening. This is done by checking your blood sugar (glucose) after you have not eaten for a while (fasting). You may have this done every 1-3 years.  Abdominal aortic aneurysm (AAA) screening. You may need this if you are a current or former smoker.  Osteoporosis. You may be screened starting at age 65 if you are at high risk. Talk with your health care provider about your test results, treatment options, and if necessary, the need for more tests. Vaccines  Your health care provider may recommend certain vaccines, such as:  Influenza vaccine. This is recommended every year.  Tetanus, diphtheria,  and acellular pertussis (Tdap, Td) vaccine. You may need a Td booster every 10 years.  Zoster vaccine. You may need this after age 71.  Pneumococcal 13-valent conjugate (PCV13) vaccine. One dose is recommended after age 62.  Pneumococcal  polysaccharide (PPSV23) vaccine. One dose is recommended after age 4. Talk to your health care provider about which screenings and vaccines you need and how often you need them. This information is not intended to replace advice given to you by your health care provider. Make sure you discuss any questions you have with your health care provider. Document Released: 06/30/2015 Document Revised: 02/21/2016 Document Reviewed: 04/04/2015 Elsevier Interactive Patient Education  2017 [place] Prevention in the Home Falls can cause injuries. They can happen to people of all ages. There are many things you can do to make your home safe and to help prevent falls. What can I do on the outside of my home?  Regularly fix the edges of walkways and driveways and fix any cracks.  Remove anything that might make you trip as you walk through a door, such as a raised step or threshold.  Trim any bushes or trees on the path to your home.  Use bright outdoor lighting.  Clear any walking paths of anything that might make someone trip, such as rocks or tools.  Regularly check to see if handrails are loose or broken. Make sure that both sides of any steps have handrails.  Any raised decks and porches should have guardrails on the edges.  Have any leaves, snow, or ice cleared regularly.  Use sand or salt on walking paths during winter.  Clean up any spills in your garage right away. This includes oil or grease spills. What can I do in the bathroom?  Use night lights.  Install grab bars by the toilet and in the tub and shower. Do not use towel bars as grab bars.  Use non-skid mats or decals in the tub or shower.  If you need to sit down in the shower, use a plastic, non-slip stool.  Keep the floor dry. Clean up any water that spills on the floor as soon as it happens.  Remove soap buildup in the tub or shower regularly.  Attach bath mats securely with double-sided non-slip rug tape.   Do not have throw rugs and other things on the floor that can make you trip. What can I do in the bedroom?  Use night lights.  Make sure that you have a light by your bed that is easy to reach.  Do not use any sheets or blankets that are too big for your bed. They should not hang down onto the floor.  Have a firm chair that has side arms. You can use this for support while you get dressed.  Do not have throw rugs and other things on the floor that can make you trip. What can I do in the kitchen?  Clean up any spills right away.  Avoid walking on wet floors.  Keep items that you use a lot in easy-to-reach places.  If you need to reach something above you, use a strong step stool that has a grab bar.  Keep electrical cords out of the way.  Do not use floor polish or wax that makes floors slippery. If you must use wax, use non-skid floor wax.  Do not have throw rugs and other things on the floor that can make you trip. What can I do with my  stairs?  Do not leave any items on the stairs.  Make sure that there are handrails on both sides of the stairs and use them. Fix handrails that are broken or loose. Make sure that handrails are as long as the stairways.  Check any carpeting to make sure that it is firmly attached to the stairs. Fix any carpet that is loose or worn.  Avoid having throw rugs at the top or bottom of the stairs. If you do have throw rugs, attach them to the floor with carpet tape.  Make sure that you have a light switch at the top of the stairs and the bottom of the stairs. If you do not have them, ask someone to add them for you. What else can I do to help prevent falls?  Wear shoes that:  Do not have high heels.  Have rubber bottoms.  Are comfortable and fit you well.  Are closed at the toe. Do not wear sandals.  If you use a stepladder:  Make sure that it is fully opened. Do not climb a closed stepladder.  Make sure that both sides of the  stepladder are locked into place.  Ask someone to hold it for you, if possible.  Clearly mark and make sure that you can see:  Any grab bars or handrails.  First and last steps.  Where the edge of each step is.  Use tools that help you move around (mobility aids) if they are needed. These include:  Canes.  Walkers.  Scooters.  Crutches.  Turn on the lights when you go into a dark area. Replace any light bulbs as soon as they burn out.  Set up your furniture so you have a clear path. Avoid moving your furniture around.  If any of your floors are uneven, fix them.  If there are any pets around you, be aware of where they are.  Review your medicines with your doctor. Some medicines can make you feel dizzy. This can increase your chance of falling. Ask your doctor what other things that you can do to help prevent falls. This information is not intended to replace advice given to you by your health care provider. Make sure you discuss any questions you have with your health care provider. Document Released: 03/30/2009 Document Revised: 11/09/2015 Document Reviewed: 07/08/2014 Elsevier Interactive Patient Education  2017 [employer].

## 2018-12-08 DIAGNOSIS — I10 Essential (primary) hypertension: Secondary | ICD-10-CM | POA: Diagnosis not present

## 2018-12-08 DIAGNOSIS — R351 Nocturia: Secondary | ICD-10-CM | POA: Diagnosis not present

## 2018-12-08 DIAGNOSIS — E785 Hyperlipidemia, unspecified: Secondary | ICD-10-CM | POA: Diagnosis not present

## 2018-12-08 DIAGNOSIS — M791 Myalgia, unspecified site: Secondary | ICD-10-CM | POA: Diagnosis not present

## 2018-12-08 DIAGNOSIS — N401 Enlarged prostate with lower urinary tract symptoms: Secondary | ICD-10-CM | POA: Diagnosis not present

## 2018-12-09 LAB — CBC WITH DIFFERENTIAL/PLATELET
Basophils Absolute: 0.1 10*3/uL (ref 0.0–0.2)
Basos: 1 %
EOS (ABSOLUTE): 0.1 10*3/uL (ref 0.0–0.4)
Eos: 2 %
Hematocrit: 41.3 % (ref 37.5–51.0)
Hemoglobin: 14 g/dL (ref 13.0–17.7)
Immature Grans (Abs): 0 10*3/uL (ref 0.0–0.1)
Immature Granulocytes: 0 %
Lymphocytes Absolute: 1.4 10*3/uL (ref 0.7–3.1)
Lymphs: 22 %
MCH: 32.7 pg (ref 26.6–33.0)
MCHC: 33.9 g/dL (ref 31.5–35.7)
MCV: 97 fL (ref 79–97)
Monocytes Absolute: 0.7 10*3/uL (ref 0.1–0.9)
Monocytes: 12 %
Neutrophils Absolute: 3.8 10*3/uL (ref 1.4–7.0)
Neutrophils: 63 %
Platelets: 221 10*3/uL (ref 150–450)
RBC: 4.28 x10E6/uL (ref 4.14–5.80)
RDW: 13 % (ref 11.6–15.4)
WBC: 6.1 10*3/uL (ref 3.4–10.8)

## 2018-12-09 LAB — LIPID PANEL
Chol/HDL Ratio: 1.8 ratio (ref 0.0–5.0)
Cholesterol, Total: 100 mg/dL (ref 100–199)
HDL: 57 mg/dL (ref >39)
LDL Calculated: 36 mg/dL (ref 0–99)
Triglycerides: 35 mg/dL (ref 0–149)
VLDL Cholesterol Cal: 7 mg/dL (ref 5–40)

## 2018-12-09 LAB — COMPREHENSIVE METABOLIC PANEL
ALT: 18 IU/L (ref 0–44)
AST: 19 IU/L (ref 0–40)
Albumin/Globulin Ratio: 1.8 (ref 1.2–2.2)
Albumin: 3.8 g/dL (ref 3.7–4.7)
Alkaline Phosphatase: 150 IU/L — ABNORMAL HIGH (ref 39–117)
BUN/Creatinine Ratio: 10 (ref 10–24)
BUN: 9 mg/dL (ref 8–27)
Bilirubin Total: 0.8 mg/dL (ref 0.0–1.2)
CO2: 21 mmol/L (ref 20–29)
Calcium: 8.7 mg/dL (ref 8.6–10.2)
Chloride: 102 mmol/L (ref 96–106)
Creatinine, Ser: 0.93 mg/dL (ref 0.76–1.27)
GFR calc Af Amer: 89 mL/min/{1.73_m2} (ref >59)
GFR calc non Af Amer: 77 mL/min/{1.73_m2} (ref >59)
Globulin, Total: 2.1 g/dL (ref 1.5–4.5)
Glucose: 88 mg/dL (ref 65–99)
Potassium: 4.1 mmol/L (ref 3.5–5.2)
Sodium: 135 mmol/L (ref 134–144)
Total Protein: 5.9 g/dL — ABNORMAL LOW (ref 6.0–8.5)

## 2018-12-09 LAB — PSA: Prostate Specific Ag, Serum: 4.9 ng/mL — ABNORMAL HIGH (ref 0.0–4.0)

## 2018-12-09 LAB — TSH: TSH: 3.16 u[IU]/mL (ref 0.450–4.500)

## 2018-12-09 LAB — CK: Total CK: 131 U/L (ref 41–331)

## 2018-12-10 ENCOUNTER — Telehealth: Payer: Self-pay

## 2018-12-10 MED ORDER — FINASTERIDE 5 MG PO TABS: 5.0000 mg | ORAL_TABLET | Freq: Every day | ORAL | 11 refills | Status: DC

## 2018-12-10 NOTE — Telephone Encounter (Signed)
[id number] 

## 2018-12-10 NOTE — Telephone Encounter (Signed)
No

## 2018-12-10 NOTE — Telephone Encounter (Signed)
-----   Message from Jerrol Banana., MD sent at 12/10/2018  7:47 AM EDT ----- Labs Ok--PSA a little lower.

## 2018-12-10 NOTE — Telephone Encounter (Signed)
Patient advised of results. Patient wants to know if we should do anything about his PSA since it is still elevated? Please advise.

## 2018-12-10 NOTE — Telephone Encounter (Signed)
Left message to call back  

## 2018-12-10 NOTE — Telephone Encounter (Signed)
Advised patient that PSA was lower, not higher. Is there anything else we need to do for this? Please advise. Thanks!

## 2018-12-11 NOTE — Telephone Encounter (Signed)
Advised patient as below.  

## 2018-12-13 ENCOUNTER — Encounter: Payer: Self-pay | Admitting: Family Medicine

## 2019-01-04 ENCOUNTER — Encounter: Payer: Self-pay | Admitting: Podiatry

## 2019-01-04 ENCOUNTER — Ambulatory Visit: Payer: PPO | Admitting: Podiatry

## 2019-01-04 ENCOUNTER — Other Ambulatory Visit: Payer: Self-pay

## 2019-01-04 VITALS — Temp 98.2°F

## 2019-01-04 DIAGNOSIS — G5791 Unspecified mononeuropathy of right lower limb: Secondary | ICD-10-CM

## 2019-01-04 DIAGNOSIS — M778 Other enthesopathies, not elsewhere classified: Secondary | ICD-10-CM

## 2019-01-04 DIAGNOSIS — M779 Enthesopathy, unspecified: Secondary | ICD-10-CM

## 2019-01-04 NOTE — Patient Instructions (Signed)
  Thank you allowing the Chronic Care Management Team to be a part of your care!    Please call a member of the CCM (Chronic Care Management) Team with any questions or case management needs:   Vanetta Mulders, BSN Nurse Care Coordinator  [phone]  Ruben Reason, PharmD  Clinical Pharmacist  [phone]  Elliot Gurney, LCSW Clinical Social Worker [phone]  Goals Addressed            This Visit's Progress   . I haven't fallen but I feel like I'm going to (pt-stated)       Current Barriers:  Marland Kitchen Knowledge Deficits related to fall precautions . Decreased adherence to prescribed treatment for fall prevention  Clinical Pharmacist Goal(s):  Marland Kitchen Over the next 30 days, patient will demonstrate improved adherence to prescribed treatment plan for decreasing falls as evidenced by patient reporting and review of EMR . Over the next 30 days, patient will verbalize using fall risk reduction strategies discussed . Over the next 30 days, patient will not experience falls . Pharmacist will review medications for any increasing dizziness or risk of fall and recommend changes to prescriber  Interventions:  . Provided written and verbal education re: Potential causes of falls and Fall prevention strategies . Reviewed medications and discussed potential side effects of medications such as dizziness and frequent urination . Assessed for s/s of orthostatic hypotension . Assessed for falls since last encounter. . Assessed patients knowledge of fall risk prevention secondary to previously provided education. . Assessed working status of life alert bracelet and patient adherence . Provided patient information on MedAlert   Patient Self Care Activities:  . Utilize assistive device appropriately with all ambulation as needed . De-clutter walkways . Change positions slowly . Wear secure fitting shoes at all times with ambulation . Utilize home lighting for dim lit areas . Have self  and pet awareness at all times  Plan: . CCM pharmacist will follow up about 30 days   Initial goal documentation     . I know my medications very well (pt-stated)       Current Barriers:  . Patient has taken medications for a very long time, some therapeutic guidelines may be updated  Pharmacist Clinical Goal(s):  Marland Kitchen Over the next 90 days, CCM clinical pharmacist and patient will collaborate with patient's providers to ensure that patient's regimen is both simplified and optimized for each medical condition   Interventions: . Comprehensive medication review performed. . Collaboration with [facility]  . Collaboration with provider re: medication management  Patient Self Care Activities:  . Self administers medications as prescribed . Calls provider office for new concerns or questions  Initial goal documentation        The patient verbalized understanding of instructions provided today and declined a print copy of patient instruction materials.

## 2019-01-04 NOTE — Progress Notes (Signed)
He presents today for follow-up of his cortisone injections for his capsulitis and neuritis.  Objective: Today he presents today with X marks the spot pulses are palpable.  First intermetatarsal and third intermetatarsal spaces are exquisitely painful when he has the axes marked.  Assessment: Capsulitis neuritis.  Plan: Injected 20 mg Kenalog 5 mg Marcaine for maximal tenderness right foot.  X2 locations foot today.  Tolerated procedure well without complications

## 2019-01-04 NOTE — Chronic Care Management (AMB) (Signed)
Chronic Care Management   Note  01/04/2019 - LATE ENTRY Name: [name] MRN: [id number] DOB: 24-Oct-1940  Subjective:  [name] is a 78 year old male patient of Dr. Miguel Aschoff referred to Chronic Care Management clinical pharmacist services by patient's health plan. Initial pharmacy visit today conducted via telephone, HIPAA identifiers verified.  [name] provided detailed medication list via fax to provider, including how he schedules medications AM/PM. Patient reports main concerns are falling: he hasn't fallen but he feels like he is going to. He also reports swelling in legs- not sure if side effect of medication or another cause.   Objective: Lab Results  Component Value Date   CREATININE 0.93 12/08/2018   CREATININE 1.14 04/29/2018   CREATININE 0.99 01/01/2018    Lab Results  Component Value Date   HGBA1C 5.4 08/18/2016    Lipid Panel     Component Value Date/Time   CHOL 100 12/08/2018 0807   TRIG 35 12/08/2018 0807   HDL 57 12/08/2018 0807   CHOLHDL 1.8 12/08/2018 0807   CHOLHDL 2.6 08/18/2016 1432   VLDL 11 08/18/2016 1432   LDLCALC 36 12/08/2018 0807    BP Readings from Last 3 Encounters:  12/07/18 128/78  05/04/18 120/60  04/29/18 132/80    Allergies  Allergen Reactions  . Timolol Maleate [Timolol Maleate] Shortness Of Breath  . Bactrim [Sulfamethoxazole-Trimethoprim]     Pt says "it takes his skin pigment off  . Budesonide-Formoterol Fumarate Other (See Comments)    SYMBICORT Patient reports dizziness and trembling/shaky feeling.  . Lisinopril Other (See Comments)    cough Cough    Medications Reviewed Today    Reviewed by Cathi Roan, [facility] (Pharmacist) on 01/04/19 at 1115  Med List Status: <None>  Medication Order Taking? Sig Documenting Provider Last Dose Status Informant  ADVAIR HFA 230-21 MCG/ACT inhaler [id number] Yes Inhale 2 puffs into the lungs 2 (two) times daily.  [provider] Taking Active Self            Med Note Jannett Celestine Aug 18, 2016  2:29 PM)    albuterol (PROVENTIL HFA;VENTOLIN HFA) 108 (90 Base) MCG/ACT inhaler [id number] Yes Inhale into the lungs every 6 (six) hours as needed for wheezing or shortness of breath. [provider] Taking Active   atorvastatin (LIPITOR) 40 MG tablet [id number] Yes TAKE 1 TABLET BY MOUTH DAILY AT Ezequiel Essex., MD Taking Active   azithromycin [facility]) 250 MG tablet [id number] Yes Take 250 mg by mouth daily. [provider] Taking Active   brimonidine (ALPHAGAN) 0.15 % ophthalmic solution [id number] Yes Place 1 drop into both eyes 2 (two) times daily.  [provider] Taking Active Self           Med Note Lonna Cobb Dec 31, 2015 10:47 AM)    diltiazem (CARDIZEM CD) 180 MG 24 hr capsule [id number] Yes TAKE 1 CAPSULE BY MOUTH DAILY Jerrol Banana., MD Taking Active   dorzolamide (TRUSOPT) 2 % ophthalmic solution [id number] Yes Place 1 drop into both eyes 2 (two) times daily.  [provider] Taking Active Self           Med Note Lonna Cobb Dec 31, 2015 10:48 AM)    finasteride (PROSCAR) 5 MG tablet [id number] No Take 1 tablet (5 mg total) by mouth daily.  Patient not taking: Reported on 01/04/2019   Miguel Aschoff  Kaylyn Lim., MD Not Taking Active   fluticasone (FLONASE) 50 MCG/ACT nasal spray [id number] Yes Place 2 sprays into both nostrils 2 (two) times daily.  [provider] Taking Active   gabapentin (NEURONTIN) 400 MG capsule [id number] Yes Take 400 mg by mouth daily.  [provider] Taking Active   isosorbide mononitrate (IMDUR) 30 MG 24 hr tablet [id number] No Take 1 tablet (30 mg total) by mouth daily.  Patient not taking: Reported on 12/07/2018   Jerrol Banana., MD Not Taking Active Self  latanoprost (XALATAN) 0.005 % ophthalmic solution [id number] Yes Place 1 drop into both eyes at bedtime.  [provider] Taking Active Self   Melatonin 5 MG TABS [id number] Yes Take 1 tablet by mouth at bedtime. [provider] Taking Active   montelukast (SINGULAIR) 10 MG tablet [id number]  Take 10 mg by mouth at bedtime.  [provider]  Active   omeprazole (PRILOSEC) 20 MG capsule [id number] No TAKE 1 CAPSULE BY MOUTH DAILY  Patient not taking: No sig reported   Jerrol Banana., MD Not Taking Active   tamsulosin [facility]) 0.4 MG CAPS capsule [id number] Yes Take 1 capsule (0.4 mg total) by mouth daily. Bjorn Loser, MD Taking Active   tiotropium (SPIRIVA HANDIHALER) 18 MCG inhalation capsule [id number]  Place 18 mcg into inhaler and inhale daily.  [provider]  Expired 12/08/18 2359 Self           Med Note Rolena Infante, TIFFANY N   Sun Dec 31, 2015 10:42 AM)    triamcinolone ointment (KENALOG) 0.1 % [id number] No Apply 1 application topically as directed. To cracks on fingers as needed [provider] Not Taking Active   XARELTO 10 MG TABS tablet [id number] Yes Take 10 mg by mouth daily.  [provider] Taking Active Self           Med Note Sidonie Dickens   Tue Nov 05, 2016  4:52 PM)    Med List Note Cristie Hem, CPhT 08/13/17 1858): PT REQUEST TO TAKE HIS MORNING MEDIACTIION AT 0600 AND NIGHT MEDICATION AT 2200           Assessment:    Goals Addressed            This Visit's Progress   . I haven't fallen but I feel like I'm going to (pt-stated)       Current Barriers:  Marland Kitchen Knowledge Deficits related to fall precautions . Decreased adherence to prescribed treatment for fall prevention  Clinical Pharmacist Goal(s):  Marland Kitchen Over the next 30 days, patient will demonstrate improved adherence to prescribed treatment plan for decreasing falls as evidenced by patient reporting and review of EMR . Over the next 30 days, patient will verbalize using fall risk reduction strategies discussed . Over the next 30 days, patient will not experience falls . Pharmacist will review  medications for any increasing dizziness or risk of fall and recommend changes to prescriber  Interventions:  . Provided written and verbal education re: Potential causes of falls and Fall prevention strategies . Reviewed medications and discussed potential side effects of medications such as dizziness and frequent urination . Assessed for s/s of orthostatic hypotension . Assessed for falls since last encounter. . Assessed patients knowledge of fall risk prevention secondary to previously provided education. . Assessed working status of life alert bracelet and patient adherence . Provided patient information on MedAlert   Patient Self Care Activities:  . Utilize assistive device appropriately  with all ambulation as needed . De-clutter walkways . Change positions slowly . Wear secure fitting shoes at all times with ambulation . Utilize home lighting for dim lit areas . Have self and pet awareness at all times  Plan: . CCM pharmacist will follow up about 30 days   Initial goal documentation     . I know my medications very well (pt-stated)       Current Barriers:  . Patient has taken medications for a very long time, some therapeutic guidelines may be updated  Pharmacist Clinical Goal(s):  Marland Kitchen Over the next 90 days, CCM clinical pharmacist and patient will collaborate with patient's providers to ensure that patient's regimen is both simplified and optimized for each medical condition   Interventions: . Comprehensive medication review performed. . Collaboration with [facility]  . Collaboration with provider re: medication management  Patient Self Care Activities:  . Self administers medications as prescribed . Calls provider office for new concerns or questions  Initial goal documentation        Plan:  Recommendations discussed with patient - Falls prevention - Continue to take all medications as prescribed  Follow up: Telephone follow up  appointment with care management team member scheduled for:30 days with PharmD for progression of goals  Ruben Reason, PharmD Clinical Pharmacist [place] [phone]

## 2019-01-07 ENCOUNTER — Ambulatory Visit (INDEPENDENT_AMBULATORY_CARE_PROVIDER_SITE_OTHER): Payer: PPO | Admitting: Pharmacist

## 2019-01-07 DIAGNOSIS — J449 Chronic obstructive pulmonary disease, unspecified: Secondary | ICD-10-CM

## 2019-01-07 DIAGNOSIS — M25552 Pain in left hip: Secondary | ICD-10-CM | POA: Diagnosis not present

## 2019-01-07 DIAGNOSIS — Z86718 Personal history of other venous thrombosis and embolism: Secondary | ICD-10-CM

## 2019-01-07 NOTE — Patient Instructions (Signed)
Goals Addressed            This Visit's Progress   . I am in the donut hole (pt-stated)       Current Barriers:  . financial  Pharmacist Clinical Goal(s): Over the next 14 days, [name] will provide the necessary supplementary documents (proof of out of pocket prescription expenditure, proof of household income) needed for medication assistance applications to CCM pharmacist.   Interventions: . CCM pharmacist will apply for medication assistance program for Spiriva made by [employer], Xarelto made by [employer] and Wynetta Emery [name].   Patient Self Care Activities:  Marland Kitchen Gather necessary documents needed to apply for medication assistance  Initial goal documentation     . COMPLETED: I know my medications very well (pt-stated)       Current Barriers:  . Patient has taken medications for a very long time, some therapeutic guidelines may be updated  Pharmacist Clinical Goal(s):  Marland Kitchen Over the next 90 days, CCM clinical pharmacist and patient will collaborate with patient's providers to ensure that patient's regimen is both simplified and optimized for each medical condition   Interventions: . Comprehensive medication review performed. . Collaboration with [facility]  . Collaboration with provider re: medication management  Patient Self Care Activities:  . Self administers medications as prescribed . Calls provider office for new concerns or questions  Initial goal documentation

## 2019-01-07 NOTE — Chronic Care Management (AMB) (Signed)
  Chronic Care Management   Follow Up Note   01/07/2019 Name: [name] MRN: [id number] DOB: January 05, 1941  Subjective [name] is a 78 y.o. year old male who is a primary care patient of Jerrol Banana., MD. The CCM clinical pharmacist outreach today via telephone for 30 day follow up. HIPAA identifiers verified.  Assessment Review of patient status, including review of consultants reports, relevant laboratory and other test results, and collaboration with appropriate care team members and the patient's provider was performed as part of comprehensive patient evaluation and provision of chronic care management services.    #Donut hole: Patient states he is in the Medicare gap and having difficulty affording Spirva, Xarelto  #leg swelling: intermittent; mostly in feet/ankles; assessed medication list for SE, no known side effect to medication; patient denies salt heavy diet; will refer to Dr. Rosanna Randy  Goals Addressed            This Visit's Progress   . I am in the donut hole (pt-stated)       Current Barriers:  . financial  Pharmacist Clinical Goal(s): Over the next 14 days, [name] will provide the necessary supplementary documents (proof of out of pocket prescription expenditure, proof of household income) needed for medication assistance applications to CCM pharmacist.   Interventions: . CCM pharmacist will apply for medication assistance program for Spiriva made by [employer], Xarelto made by [employer] and Wynetta Emery [name].   Patient Self Care Activities:  Marland Kitchen Gather necessary documents needed to apply for medication assistance  Initial goal documentation     . COMPLETED: I know my medications very well (pt-stated)       Current Barriers:  . Patient has taken medications for a very long time, some therapeutic guidelines may be updated  Pharmacist Clinical Goal(s):  Marland Kitchen Over the next 90 days, CCM clinical pharmacist and patient will collaborate with  patient's providers to ensure that patient's regimen is both simplified and optimized for each medical condition   Interventions: . Comprehensive medication review performed. . Collaboration with [facility]  . Collaboration with provider re: medication management  Patient Self Care Activities:  . Self administers medications as prescribed . Calls provider office for new concerns or questions  Initial goal documentation         Telephone follow up appointment with care management team member scheduled for: 2 weeks with PharmD   Ruben Reason, PharmD Clinical Pharmacist [place] [phone]

## 2019-01-15 DIAGNOSIS — H401132 Primary open-angle glaucoma, bilateral, moderate stage: Secondary | ICD-10-CM | POA: Diagnosis not present

## 2019-01-18 DIAGNOSIS — R918 Other nonspecific abnormal finding of lung field: Secondary | ICD-10-CM | POA: Diagnosis not present

## 2019-01-18 DIAGNOSIS — J90 Pleural effusion, not elsewhere classified: Secondary | ICD-10-CM | POA: Diagnosis not present

## 2019-01-18 DIAGNOSIS — J9811 Atelectasis: Secondary | ICD-10-CM | POA: Diagnosis not present

## 2019-01-19 ENCOUNTER — Other Ambulatory Visit: Payer: Self-pay

## 2019-01-19 NOTE — Telephone Encounter (Signed)
Flomax refill sent to pharmacy

## 2019-01-20 MED ORDER — TAMSULOSIN HCL 0.4 MG PO CAPS: 0.4000 mg | ORAL_CAPSULE | Freq: Every day | ORAL | 0 refills | Status: DC

## 2019-01-20 NOTE — Telephone Encounter (Signed)
Yes please

## 2019-01-21 ENCOUNTER — Ambulatory Visit: Payer: Self-pay | Admitting: Pharmacist

## 2019-01-21 DIAGNOSIS — J449 Chronic obstructive pulmonary disease, unspecified: Secondary | ICD-10-CM

## 2019-01-21 DIAGNOSIS — Z86718 Personal history of other venous thrombosis and embolism: Secondary | ICD-10-CM

## 2019-01-22 NOTE — Chronic Care Management (AMB) (Signed)
  Chronic Care Management   Follow Up Note   01/22/2019 Name: [name] MRN: [id number] DOB: [birth year]  Subjective [name] is a 78 y.o. year old male who is a primary care patient of Jerrol Banana., MD. The CCM clinical pharmacist following up today for 2 week follow up for medication assistance. Unfortunately, patient was unavailable. Left HIPAA complaint voicemail requesting call back.    Goals Addressed            This Visit's Progress   . I am in the donut hole (pt-stated)       Current Barriers:  . financial  Pharmacist Clinical Goal(s): Over the next 14 days, [name] will provide the necessary supplementary documents (proof of out of pocket prescription expenditure, proof of household income) needed for medication assistance applications to CCM pharmacist.   Interventions: . CCM pharmacist will apply for medication assistance program for Spiriva made by [employer], Xarelto made by [employer] and Wynetta Emery [name].  Marland Kitchen Updated 8/6: have not received patient portion of applications; faxed Dr. Tessa Lerner and Dr. Joan Flores provider portions   Patient Self Care Activities:  Marland Kitchen Gather necessary documents needed to apply for medication assistance  Please see past updates related to this goal by clicking on the "Past Updates" button in the selected goal         Care Coordination: Faxed provider portion of Xarelto application to Dr. Joan Flores and Lebron Conners application to Dr. Tessa Lerner   Follow up: Telephone follow up appointment with care management team member scheduled for: 1 week  Ruben Reason, PharmD Clinical Pharmacist [place] [phone]

## 2019-01-28 ENCOUNTER — Telehealth: Payer: Self-pay

## 2019-01-29 ENCOUNTER — Telehealth: Payer: Self-pay

## 2019-01-29 ENCOUNTER — Ambulatory Visit: Payer: Self-pay | Admitting: Pharmacist

## 2019-01-29 NOTE — Chronic Care Management (AMB) (Signed)
  Chronic Care Management   Note  01/29/2019 Name: [name] MRN: [id number] DOB: 04-11-1941  78 y.o. year old male engaged with CCM clinical pharmacist for medication assistance.   Was unable to reach patient via telephone today and have left HIPAA compliant voicemail asking patient to return my call. (unsuccessful outreach #1).  Follow up plan: A HIPPA compliant phone message was left for the patient providing contact information and requesting a return call.  The care management team will reach out to the patient again over the next 4-7 days.   Ruben Reason, PharmD Clinical Pharmacist [place] [phone]

## 2019-02-01 ENCOUNTER — Ambulatory Visit (INDEPENDENT_AMBULATORY_CARE_PROVIDER_SITE_OTHER): Payer: PPO | Admitting: Pharmacist

## 2019-02-01 DIAGNOSIS — I10 Essential (primary) hypertension: Secondary | ICD-10-CM

## 2019-02-01 DIAGNOSIS — J449 Chronic obstructive pulmonary disease, unspecified: Secondary | ICD-10-CM

## 2019-02-01 DIAGNOSIS — Z86718 Personal history of other venous thrombosis and embolism: Secondary | ICD-10-CM

## 2019-02-01 NOTE — Chronic Care Management (AMB) (Signed)
  Chronic Care Management   Follow Up Note   02/01/2019 Name: [name] MRN: [id number] DOB: [birth year]  Subjective [name] is a 78 y.o. year old male who is a primary care patient of Jerrol Banana., MD. Incoming call from [name]. HIPAA identifiers verified.    Goals Addressed            This Visit's Progress   . I am in the donut hole (pt-stated)       Current Barriers:  . financial  Pharmacist Clinical Goal(s): Over the next 14 days, [name] will provide the necessary supplementary documents (proof of out of pocket prescription expenditure, proof of household income) needed for medication assistance applications to CCM pharmacist.   Interventions: . CCM pharmacist will apply for medication assistance program for Spiriva made by [employer], Xarelto made by [employer] and Wynetta Emery [name].  Marland Kitchen Updated 8/6: have not received patient portion of applications; faxed Dr. Tessa Lerner and Dr. Joan Flores provider portions  . Updated 4/26: reviewed applications; patient requests applications for albuterol rescue inhaler and Advair ([place])  Patient Self Care Activities:  Marland Kitchen Gather necessary documents needed to apply for medication assistance  Please see past updates related to this goal by clicking on the "Past Updates" button in the selected goal         Care Coordination: Mail patient application for Advair and albuterol  Follow up: Telephone follow up appointment with care management team member scheduled for: 2 weeks  Ruben Reason, PharmD Clinical Pharmacist [place] [phone]

## 2019-02-04 ENCOUNTER — Telehealth: Payer: Self-pay

## 2019-02-08 ENCOUNTER — Other Ambulatory Visit: Payer: Self-pay

## 2019-02-08 ENCOUNTER — Ambulatory Visit (INDEPENDENT_AMBULATORY_CARE_PROVIDER_SITE_OTHER): Payer: PPO | Admitting: Family Medicine

## 2019-02-08 ENCOUNTER — Encounter: Payer: Self-pay | Admitting: Family Medicine

## 2019-02-08 VITALS — BP 145/83 | HR 60 | Temp 98.5°F | Resp 16 | Ht 74.0 in | Wt 160.0 lb

## 2019-02-08 DIAGNOSIS — N401 Enlarged prostate with lower urinary tract symptoms: Secondary | ICD-10-CM | POA: Diagnosis not present

## 2019-02-08 DIAGNOSIS — J449 Chronic obstructive pulmonary disease, unspecified: Secondary | ICD-10-CM

## 2019-02-08 DIAGNOSIS — Z23 Encounter for immunization: Secondary | ICD-10-CM

## 2019-02-08 DIAGNOSIS — R351 Nocturia: Secondary | ICD-10-CM | POA: Diagnosis not present

## 2019-02-08 DIAGNOSIS — B4481 Allergic bronchopulmonary aspergillosis: Secondary | ICD-10-CM | POA: Diagnosis not present

## 2019-02-08 DIAGNOSIS — I10 Essential (primary) hypertension: Secondary | ICD-10-CM

## 2019-02-08 DIAGNOSIS — J189 Pneumonia, unspecified organism: Secondary | ICD-10-CM

## 2019-02-08 NOTE — Progress Notes (Signed)
Patient: [name] Male    DOB: 14-Feb-1941   78 y.o.   MRN: [id number] Visit Date: 02/08/2019  Today's Provider: Wilhemena Durie, MD   Chief Complaint  Patient presents with  . Follow-up   Subjective:    HPI Patient comes in today for a follow up. He was last seen in the office 2 months ago. He is due to have PSA levels repeated. He also mentions that he is no longer on Spiriva.  BP Readings from Last 3 Encounters:  02/08/19 (!) 145/83  12/07/18 128/78  05/04/18 120/60   Wt Readings from Last 3 Encounters:  02/08/19 160 lb (72.6 kg)  12/07/18 160 lb 3.2 oz (72.7 kg)  05/04/18 161 lb (73 kg)    Allergies  Allergen Reactions  . Timolol Maleate [Timolol Maleate] Shortness Of Breath  . Bactrim [Sulfamethoxazole-Trimethoprim]     Pt says "it takes his skin pigment off  . Budesonide-Formoterol Fumarate Other (See Comments)    SYMBICORT Patient reports dizziness and trembling/shaky feeling.  . Lisinopril Other (See Comments)    cough Cough     Current Outpatient Medications:  .  ADVAIR HFA [id number] MCG/ACT inhaler, Inhale 2 puffs into the lungs 2 (two) times daily. , Disp: , Rfl:  .  albuterol (PROVENTIL HFA;VENTOLIN HFA) 108 (90 Base) MCG/ACT inhaler, Inhale into the lungs every 6 (six) hours as needed for wheezing or shortness of breath., Disp: , Rfl:  .  atorvastatin (LIPITOR) 40 MG tablet, TAKE 1 TABLET BY MOUTH DAILY AT 6PM, Disp: 90 tablet, Rfl: 3 .  brimonidine (ALPHAGAN) 0.15 % ophthalmic solution, Place 1 drop into both eyes 2 (two) times daily. , Disp: , Rfl:  .  diltiazem (CARDIZEM CD) 180 MG 24 hr capsule, TAKE 1 CAPSULE BY MOUTH DAILY, Disp: 90 capsule, Rfl: 3 .  dorzolamide (TRUSOPT) 2 % ophthalmic solution, Place 1 drop into both eyes 2 (two) times daily. , Disp: , Rfl:  .  fluticasone (FLONASE) 50 MCG/ACT nasal spray, Place 2 sprays into both nostrils 2 (two) times daily. , Disp: , Rfl:  .  gabapentin (NEURONTIN) 400 MG capsule, Take 400 mg by mouth  daily. , Disp: , Rfl:  .  latanoprost (XALATAN) 0.005 % ophthalmic solution, Place 1 drop into both eyes at bedtime. , Disp: , Rfl:  .  Melatonin 5 MG TABS, Take 1 tablet by mouth at bedtime., Disp: , Rfl:  .  montelukast (SINGULAIR) 10 MG tablet, Take 10 mg by mouth at bedtime. , Disp: , Rfl:  .  tamsulosin (FLOMAX) 0.4 MG CAPS capsule, Take 1 capsule (0.4 mg total) by mouth daily., Disp: 30 capsule, Rfl: 0 .  triamcinolone ointment (KENALOG) 0.1 %, Apply 1 application topically as directed. To cracks on fingers as needed, Disp: , Rfl:  .  XARELTO 10 MG TABS tablet, Take 10 mg by mouth daily. , Disp: , Rfl:  .  azithromycin (ZITHROMAX) 250 MG tablet, Take 250 mg by mouth daily., Disp: , Rfl:  .  finasteride (PROSCAR) 5 MG tablet, Take 1 tablet (5 mg total) by mouth daily. (Patient not taking: Reported on 01/04/2019), Disp: 30 tablet, Rfl: 11 .  isosorbide mononitrate (IMDUR) 30 MG 24 hr tablet, Take 1 tablet (30 mg total) by mouth daily. (Patient not taking: Reported on 12/07/2018), Disp: 90 tablet, Rfl: 3 .  omeprazole (PRILOSEC) 20 MG capsule, TAKE 1 CAPSULE BY MOUTH DAILY (Patient not taking: No sig reported), Disp: 90 capsule, Rfl: 3 .  tiotropium (SPIRIVA HANDIHALER) 18 MCG inhalation capsule, Place 18 mcg into inhaler and inhale daily. , Disp: , Rfl:   Review of Systems  Constitutional: Negative for activity change and fatigue.  Eyes: Negative.   Respiratory: Negative for cough and shortness of breath.   Cardiovascular: Negative for chest pain, palpitations and leg swelling.  Musculoskeletal: Positive for arthralgias.  Allergic/Immunologic: Negative.   Neurological: Negative for dizziness, light-headedness and headaches.  Psychiatric/Behavioral: Negative for agitation, self-injury, sleep disturbance and suicidal ideas. The patient is not nervous/anxious.     Social History   Tobacco Use  . Smoking status: Former Smoker    Packs/day: 0.50    Years: 3.00    Pack years: 1.50     Types: Cigarettes    Quit date: 06/17/1958    Years since quitting: 60.6  . Smokeless tobacco: Never Used  . Tobacco comment: smoking while in college  Substance Use Topics  . Alcohol use: Yes    Alcohol/week: 5.0 standard drinks    Types: 5 Glasses of wine per week      Objective:   BP (!) 145/83   Pulse 60   Temp 98.5 F (36.9 C)   Resp 16   Ht 6\' 2"  (1.88 m)   Wt 160 lb (72.6 kg)   BMI 20.54 kg/m  Vitals:   02/08/19 0822  BP: (!) 145/83  Pulse: 60  Resp: 16  Temp: 98.5 F (36.9 C)  Weight: 160 lb (72.6 kg)  Height: 6\' 2"  (1.88 m)     Physical Exam Constitutional:      Comments: Cachectic WM NAD.  HENT:     Head: Normocephalic and atraumatic.  Eyes:     General: No scleral icterus. Cardiovascular:     Rate and Rhythm: Normal rate and regular rhythm.     Heart sounds: Normal heart sounds.  Pulmonary:     Effort: Pulmonary effort is normal.     Breath sounds: Normal breath sounds.  Abdominal:     Palpations: Abdomen is soft.  Skin:    General: Skin is warm and dry.  Neurological:     General: No focal deficit present.     Mental Status: He is alert and oriented to person, place, and time.  Psychiatric:        Mood and Affect: Mood normal.        Behavior: Behavior normal.        Thought Content: Thought content normal.        Judgment: Judgment normal.      No results found for any visits on 02/08/19.     Assessment & Plan      1. Essential hypertension   2. BPH associated with nocturia   3. Need for influenza vaccination  - Flu Vaccine QUAD High Dose(Fluad)  4. Chronic obstructive pulmonary disease, unspecified COPD type ([place])   5. Recurrent pneumonia   6. ABPA (allergic bronchopulmonary aspergillosis) ([place]) Try Singulair for AR.   Cranford Mon, MD  [facility]

## 2019-02-15 ENCOUNTER — Ambulatory Visit: Payer: Self-pay | Admitting: Pharmacist

## 2019-02-15 DIAGNOSIS — J449 Chronic obstructive pulmonary disease, unspecified: Secondary | ICD-10-CM

## 2019-02-15 DIAGNOSIS — B4481 Allergic bronchopulmonary aspergillosis: Secondary | ICD-10-CM

## 2019-02-15 NOTE — Chronic Care Management (AMB) (Signed)
  Chronic Care Management   Follow Up Note   02/15/2019 Name: [name] MRN: [id number] DOB: 07-29-1940  Subjective [name] is a 78 y.o. year old male who is a primary care patient of Jerrol Banana., MD. Follow up call to [name], spoke with wife [name] ([place]). HIPAA identifiers verified.    Goals Addressed            This Visit's Progress   . I am in the donut hole (pt-stated)       Current Barriers:  . financial  Pharmacist Clinical Goal(s): Over the next 14 days, [name] will provide the necessary supplementary documents (proof of out of pocket prescription expenditure, proof of household income) needed for medication assistance applications to CCM pharmacist.   Interventions: . CCM pharmacist will apply for medication assistance program for Spiriva made by [name], Xarelto made by [name] and [name] [name]).  Marland Kitchen Updated 8/6: have not received patient portion of applications; faxed Dr. Tessa Lerner and Dr. Joan Flores provider portions  . Updated 0000000: reviewed applications; patient requests applications for albuterol rescue inhaler and Advair ([place]) . Updated 8/31: has received applications, has not filled out  Patient Self Care Activities:  Marland Kitchen Gather necessary documents needed to apply for medication assistance  Please see past updates related to this goal by clicking on the "Past Updates" button in the selected goal         Care Coordination: Dr Joan Flores and Dr. Ronalee Belts- double check provider portions  Follow up: Telephone follow up appointment with care management team member scheduled for: 2 weeks  Ruben Reason, PharmD Clinical Pharmacist [place] [phone]

## 2019-02-17 DIAGNOSIS — Z85828 Personal history of other malignant neoplasm of skin: Secondary | ICD-10-CM | POA: Diagnosis not present

## 2019-02-17 DIAGNOSIS — L578 Other skin changes due to chronic exposure to nonionizing radiation: Secondary | ICD-10-CM | POA: Diagnosis not present

## 2019-02-17 DIAGNOSIS — D1801 Hemangioma of skin and subcutaneous tissue: Secondary | ICD-10-CM | POA: Diagnosis not present

## 2019-02-17 DIAGNOSIS — L820 Inflamed seborrheic keratosis: Secondary | ICD-10-CM | POA: Diagnosis not present

## 2019-02-17 DIAGNOSIS — L570 Actinic keratosis: Secondary | ICD-10-CM | POA: Diagnosis not present

## 2019-02-18 DIAGNOSIS — H401132 Primary open-angle glaucoma, bilateral, moderate stage: Secondary | ICD-10-CM | POA: Diagnosis not present

## 2019-02-19 DIAGNOSIS — M25552 Pain in left hip: Secondary | ICD-10-CM | POA: Diagnosis not present

## 2019-02-25 ENCOUNTER — Encounter: Payer: Self-pay | Admitting: Pharmacist

## 2019-02-25 NOTE — Progress Notes (Signed)
This encounter was created in error - please disregard.

## 2019-02-26 DIAGNOSIS — R0982 Postnasal drip: Secondary | ICD-10-CM | POA: Diagnosis not present

## 2019-02-26 DIAGNOSIS — J029 Acute pharyngitis, unspecified: Secondary | ICD-10-CM | POA: Diagnosis not present

## 2019-03-01 ENCOUNTER — Ambulatory Visit (INDEPENDENT_AMBULATORY_CARE_PROVIDER_SITE_OTHER): Payer: PPO | Admitting: Pharmacist

## 2019-03-01 DIAGNOSIS — Z86718 Personal history of other venous thrombosis and embolism: Secondary | ICD-10-CM

## 2019-03-01 DIAGNOSIS — E785 Hyperlipidemia, unspecified: Secondary | ICD-10-CM | POA: Diagnosis not present

## 2019-03-01 NOTE — Chronic Care Management (AMB) (Signed)
Chronic Care Management   Follow Up Note   03/01/2019 Name: BELLA OHMANN MRN: [id number] DOB: 1941/05/31  Subjective Edwinna Areola Buffkin is a 78 y.o. year old male who is a primary care patient of Jerrol Banana., MD. The CCM team was consulted for assistance with chronic disease management and care coordination needs.    Assessment Review of patient status, including review of consultants reports, relevant laboratory and other test results, and collaboration with appropriate care team members and the patient's provider was performed as part of comprehensive patient evaluation and provision of chronic care management services.     #Cholesterol: Patient has stopped atorvastatin due to swelling in legs. I have updated EMR to list atorvastatin as a medication reaction. Last lipid panel 12/08/18, TC was 100, HDL 55. Lipid panel consistently at goal x 2 years. No arteriosclerosis risk factors: No hypertension, no diabetes, he is not overweight, and there is no family history of premature arterial occlusive disease.    #Med Assistance: Patient does not wish to move forward with medication assistance applications, he feels he is over income requirements set by programs.   Outpatient Encounter Medications as of 03/01/2019  Medication Sig  . ADVAIR HFA 230-21 MCG/ACT inhaler Inhale 2 puffs into the lungs 2 (two) times daily.   Marland Kitchen albuterol (PROVENTIL HFA;VENTOLIN HFA) 108 (90 Base) MCG/ACT inhaler Inhale into the lungs every 6 (six) hours as needed for wheezing or shortness of breath.  Marland Kitchen azithromycin (ZITHROMAX) 250 MG tablet Take 250 mg by mouth daily.  . brimonidine (ALPHAGAN) 0.15 % ophthalmic solution Place 1 drop into both eyes 2 (two) times daily.   Marland Kitchen diltiazem (CARDIZEM CD) 180 MG 24 hr capsule TAKE 1 CAPSULE BY MOUTH DAILY  . dorzolamide (TRUSOPT) 2 % ophthalmic solution Place 1 drop into both eyes 2 (two) times daily.   . finasteride (PROSCAR) 5 MG tablet Take 1 tablet (5 mg total) by mouth  daily. (Patient not taking: Reported on 01/04/2019)  . fluticasone (FLONASE) 50 MCG/ACT nasal spray Place 2 sprays into both nostrils 2 (two) times daily.   Marland Kitchen gabapentin (NEURONTIN) 400 MG capsule Take 400 mg by mouth daily.   Marland Kitchen latanoprost (XALATAN) 0.005 % ophthalmic solution Place 1 drop into both eyes at bedtime.   . Melatonin 5 MG TABS Take 1 tablet by mouth at bedtime.  . montelukast (SINGULAIR) 10 MG tablet Take 10 mg by mouth at bedtime.   Marland Kitchen omeprazole (PRILOSEC) 20 MG capsule TAKE 1 CAPSULE BY MOUTH DAILY (Patient not taking: No sig reported)  . tamsulosin (FLOMAX) 0.4 MG CAPS capsule Take 1 capsule (0.4 mg total) by mouth daily.  Marland Kitchen tiotropium (SPIRIVA HANDIHALER) 18 MCG inhalation capsule Place 18 mcg into inhaler and inhale daily.   Marland Kitchen triamcinolone ointment (KENALOG) 0.1 % Apply 1 application topically as directed. To cracks on fingers as needed  . XARELTO 10 MG TABS tablet Take 10 mg by mouth daily.   . [DISCONTINUED] atorvastatin (LIPITOR) 40 MG tablet TAKE 1 TABLET BY MOUTH DAILY AT 6PM  . [DISCONTINUED] isosorbide mononitrate (IMDUR) 30 MG 24 hr tablet Take 1 tablet (30 mg total) by mouth daily. (Patient not taking: Reported on 12/07/2018)   No facility-administered encounter medications on file as of 03/01/2019.      Goals Addressed            This Visit's Progress   . COMPLETED: I am in the donut hole (pt-stated)       Current Barriers:  .  financial  Pharmacist Clinical Goal(s): Over the next 14 days, Mr.. Jenson will provide the necessary supplementary documents (proof of out of pocket prescription expenditure, proof of household income) needed for medication assistance applications to CCM pharmacist.   Interventions: . CCM pharmacist will apply for medication assistance program for Spiriva made by [employer], Xarelto made by [employer] and Wynetta Emery [name].  Marland Kitchen Updated 8/6: have not received patient portion of applications; faxed Dr. Tessa Lerner and Dr. Joan Flores provider  portions  . Updated 0000000: reviewed applications; patient requests applications for albuterol rescue inhaler and Advair ([place]) . Updated 8/31: has received applications, has not filled out . Updated 9/14: patient states that after reviewing applications and financial income, he feels he will not qualify, does not want to apply to programs  Patient Self Care Activities:  Marland Kitchen Gather necessary documents needed to apply for medication assistance  Please see past updates related to this goal by clicking on the "Past Updates" button in the selected goal      . COMPLETED: I haven't fallen but I feel like I'm going to (pt-stated)       Current Barriers:  Marland Kitchen Knowledge Deficits related to fall precautions . Decreased adherence to prescribed treatment for fall prevention  Clinical Pharmacist Goal(s):  Marland Kitchen Over the next 30 days, patient will demonstrate improved adherence to prescribed treatment plan for decreasing falls as evidenced by patient reporting and review of EMR . Over the next 30 days, patient will verbalize using fall risk reduction strategies discussed . Over the next 30 days, patient will not experience falls . Pharmacist will review medications for any increasing dizziness or risk of fall and recommend changes to prescriber  Interventions:  . Provided written and verbal education re: Potential causes of falls and Fall prevention strategies . Reviewed medications and discussed potential side effects of medications such as dizziness and frequent urination . Assessed for s/s of orthostatic hypotension . Assessed for falls since last encounter. . Assessed patients knowledge of fall risk prevention secondary to previously provided education. . Assessed working status of life alert bracelet and patient adherence . Provided patient information on MedAlert   Patient Self Care Activities:  . Utilize assistive device appropriately with all ambulation as needed . De-clutter walkways . Change  positions slowly . Wear secure fitting shoes at all times with ambulation . Utilize home lighting for dim lit areas . Have self and pet awareness at all times  Plan: . CCM pharmacist will follow up about 30 days   Initial goal documentation         The patient has been provided with contact information for the care management team and has been advised to call with any health related questions or concerns.    Ruben Reason, PharmD Clinical Pharmacist [place] [phone]

## 2019-03-01 NOTE — Patient Instructions (Signed)
Congratulations! You have met all case management goals! You may call the case management team at any time should you have a question or if you have new case management needs. We are happy to help you! We will let your doctor know that you have met your goals.    Thank you allowing the Chronic Care Management Team to be a part of your care!   Please call a member of the CCM (Chronic Care Management) Team with any questions or case management needs in the future:    Klani Caridi, PharmD  Clinical Pharmacist  [phone]   

## 2019-03-11 ENCOUNTER — Other Ambulatory Visit: Payer: Self-pay

## 2019-03-11 ENCOUNTER — Ambulatory Visit (INDEPENDENT_AMBULATORY_CARE_PROVIDER_SITE_OTHER): Payer: PPO | Admitting: Family Medicine

## 2019-03-11 ENCOUNTER — Encounter: Payer: Self-pay | Admitting: Family Medicine

## 2019-03-11 VITALS — BP 132/73 | HR 77 | Temp 96.9°F | Resp 18 | Wt 166.4 lb

## 2019-03-11 DIAGNOSIS — M545 Low back pain, unspecified: Secondary | ICD-10-CM

## 2019-03-11 DIAGNOSIS — J449 Chronic obstructive pulmonary disease, unspecified: Secondary | ICD-10-CM

## 2019-03-11 DIAGNOSIS — E441 Mild protein-calorie malnutrition: Secondary | ICD-10-CM

## 2019-03-11 DIAGNOSIS — B4481 Allergic bronchopulmonary aspergillosis: Secondary | ICD-10-CM

## 2019-03-11 LAB — POCT URINALYSIS DIPSTICK
Bilirubin, UA: NEGATIVE
Glucose, UA: NEGATIVE
Ketones, UA: NEGATIVE
Leukocytes, UA: NEGATIVE
Nitrite, UA: NEGATIVE
Protein, UA: NEGATIVE
Spec Grav, UA: 1.01 (ref 1.010–1.025)
Urobilinogen, UA: 0.2 E.U./dL
pH, UA: 5 (ref 5.0–8.0)

## 2019-03-11 MED ORDER — TRAMADOL HCL 50 MG PO TABS: 150.0000 mg | ORAL_TABLET | Freq: Four times a day (QID) | ORAL | 0 refills | Status: AC | PRN

## 2019-03-11 MED ORDER — PREDNISONE 10 MG (48) PO TBPK: ORAL_TABLET | ORAL | 5 refills | Status: DC

## 2019-03-11 NOTE — Progress Notes (Signed)
Patient: [name] Male    DOB: 03-05-1941   78 y.o.   MRN: [id number] Visit Date: 03/11/2019  Today's Provider: Wilhemena Durie, MD   Chief Complaint  Patient presents with  . Back Pain   Subjective:     HPI   Pain on lower left side of back that started earlier this week. He also has pain in his discs. Unable to bend over and pick anything up.  The pain is just over the  Allergies  Allergen Reactions  . Timolol Maleate [Timolol Maleate] Shortness Of Breath  . Atorvastatin Swelling    Swelling in legs, dizziness  . Bactrim [Sulfamethoxazole-Trimethoprim]     Pt says "it takes his skin pigment off  . Budesonide-Formoterol Fumarate Other (See Comments)    SYMBICORT Patient reports dizziness and trembling/shaky feeling.  . Lisinopril Other (See Comments)    cough Cough     Current Outpatient Medications:  .  ADVAIR HFA [id number] MCG/ACT inhaler, Inhale 2 puffs into the lungs 2 (two) times daily. , Disp: , Rfl:  .  albuterol (PROVENTIL HFA;VENTOLIN HFA) 108 (90 Base) MCG/ACT inhaler, Inhale into the lungs every 6 (six) hours as needed for wheezing or shortness of breath., Disp: , Rfl:  .  azithromycin (ZITHROMAX) 250 MG tablet, Take 250 mg by mouth daily., Disp: , Rfl:  .  brimonidine (ALPHAGAN) 0.15 % ophthalmic solution, Place 1 drop into both eyes 2 (two) times daily. , Disp: , Rfl:  .  diltiazem (CARDIZEM CD) 180 MG 24 hr capsule, TAKE 1 CAPSULE BY MOUTH DAILY, Disp: 90 capsule, Rfl: 3 .  dorzolamide (TRUSOPT) 2 % ophthalmic solution, Place 1 drop into both eyes 2 (two) times daily. , Disp: , Rfl:  .  finasteride (PROSCAR) 5 MG tablet, Take 1 tablet (5 mg total) by mouth daily. (Patient not taking: Reported on 01/04/2019), Disp: 30 tablet, Rfl: 11 .  fluticasone (FLONASE) 50 MCG/ACT nasal spray, Place 2 sprays into both nostrils 2 (two) times daily. , Disp: , Rfl:  .  gabapentin (NEURONTIN) 400 MG capsule, Take 400 mg by mouth daily. , Disp: , Rfl:  .  latanoprost  (XALATAN) 0.005 % ophthalmic solution, Place 1 drop into both eyes at bedtime. , Disp: , Rfl:  .  Melatonin 5 MG TABS, Take 1 tablet by mouth at bedtime., Disp: , Rfl:  .  montelukast (SINGULAIR) 10 MG tablet, Take 10 mg by mouth at bedtime. , Disp: , Rfl:  .  omeprazole (PRILOSEC) 20 MG capsule, TAKE 1 CAPSULE BY MOUTH DAILY (Patient not taking: No sig reported), Disp: 90 capsule, Rfl: 3 .  predniSONE (STERAPRED UNI-PAK 48 TAB) 10 MG (48) TBPK tablet, Taper as directed, Disp: 48 tablet, Rfl: 5 .  tamsulosin (FLOMAX) 0.4 MG CAPS capsule, Take 1 capsule (0.4 mg total) by mouth daily., Disp: 30 capsule, Rfl: 0 .  tiotropium (SPIRIVA HANDIHALER) 18 MCG inhalation capsule, Place 18 mcg into inhaler and inhale daily. , Disp: , Rfl:  .  traMADol (ULTRAM) 50 MG tablet, Take 3 tablets (150 mg total) by mouth every 6 (six) hours as needed for up to 7 days., Disp: 35 tablet, Rfl: 0 .  triamcinolone ointment (KENALOG) 0.1 %, Apply 1 application topically as directed. To cracks on fingers as needed, Disp: , Rfl:  .  XARELTO 10 MG TABS tablet, Take 10 mg by mouth daily. , Disp: , Rfl:   Review of Systems  Constitutional: Negative.   Eyes: Negative.  Respiratory: Negative.   Cardiovascular: Negative.   Gastrointestinal: Negative.   Endocrine: Negative.   Genitourinary: Negative.   Musculoskeletal: Positive for back pain.  Allergic/Immunologic: Negative.   Hematological: Negative.   Psychiatric/Behavioral: Negative.     Social History   Tobacco Use  . Smoking status: Former Smoker    Packs/day: 0.50    Years: 3.00    Pack years: 1.50    Types: Cigarettes    Quit date: 06/17/1958    Years since quitting: 60.7  . Smokeless tobacco: Never Used  . Tobacco comment: smoking while in college  Substance Use Topics  . Alcohol use: Yes    Alcohol/week: 5.0 standard drinks    Types: 5 Glasses of wine per week      Objective:   BP 132/73 (BP Location: Right Arm, Patient Position: Sitting, Cuff Size:  Normal)   Pulse 77   Temp (!) 96.9 F (36.1 C) (Temporal)   Resp 18   Wt 166 lb 6.4 oz (75.5 kg)   SpO2 99%   BMI 21.36 kg/m  Vitals:   03/11/19 1107  BP: 132/73  Pulse: 77  Resp: 18  Temp: (!) 96.9 F (36.1 C)  TempSrc: Temporal  SpO2: 99%  Weight: 166 lb 6.4 oz (75.5 kg)  Body mass index is 21.36 kg/m.   Physical Exam Vitals signs reviewed.  HENT:     Head: Normocephalic and atraumatic.     Right Ear: External ear normal.     Left Ear: External ear normal.  Eyes:     General: No scleral icterus. Cardiovascular:     Rate and Rhythm: Normal rate and regular rhythm.     Heart sounds: Normal heart sounds.  Pulmonary:     Effort: Pulmonary effort is normal.     Breath sounds: Normal breath sounds.  Abdominal:     Palpations: Abdomen is soft.     Comments: No CVAT.  Musculoskeletal:     Comments: Mild tenderness ,[place] just above left SI joint.  Skin:    General: Skin is warm and dry.  Neurological:     General: No focal deficit present.     Mental Status: He is alert and oriented to person, place, and time.     Comments: Straight leg raise negative.  Psychiatric:        Mood and Affect: Mood normal.        Behavior: Behavior normal.        Thought Content: Thought content normal.        Judgment: Judgment normal.      Results for orders placed or performed in visit on 03/11/19  POCT urinalysis dipstick  Result Value Ref Range   Color, UA yellow    Clarity, UA clear    Glucose, UA Negative Negative   Bilirubin, UA negative    Ketones, UA negative    Spec Grav, UA 1.010 1.010 - 1.025   Blood, UA trace    pH, UA 5.0 5.0 - 8.0   Protein, UA Negative Negative   Urobilinogen, UA 0.2 0.2 or 1.0 E.U./dL   Nitrite, UA Negative    Leukocytes, UA Negative Negative   Appearance     Odor Moderate        Assessment & Plan    1. Low back pain without sciatica, unspecified back pain laterality, unspecified chronicity Might benefit from PT referral. -  DG Lumbar Spine Complete; Future - traMADol (ULTRAM) 50 MG tablet; Take 3 tablets (150 mg total) by  mouth every 6 (six) hours as needed for up to 7 days.  Dispense: 35 tablet; Refill: 0 - predniSONE (STERAPRED UNI-PAK 48 TAB) 10 MG (48) TBPK tablet; Taper as directed  Dispense: 48 tablet; Refill: 5 - POCT urinalysis dipstick  2. Chronic obstructive pulmonary disease, unspecified COPD type ([place])   3. Pulmonary aspergillosis, allergic bronchopulmonary type ([place])   4. Malnutrition of mild degree ([place])      Wilhemena Durie, MD  [facility]

## 2019-03-11 NOTE — Patient Instructions (Signed)
Use ice for 2 days then use heat.

## 2019-03-12 ENCOUNTER — Telehealth: Payer: Self-pay

## 2019-03-12 DIAGNOSIS — H401132 Primary open-angle glaucoma, bilateral, moderate stage: Secondary | ICD-10-CM | POA: Diagnosis not present

## 2019-03-12 NOTE — Telephone Encounter (Signed)
For nausea--ondansetron 4mg  q 8 hrs prn,#30

## 2019-03-12 NOTE — Telephone Encounter (Signed)
Patient's wife Everlene Farrier) called and stated that patient was prescribed tramadol on yesterday and he took one at 4:00PM and after that became nauseate. Patient's wife states that he have not taking another Tramadol since then and patient still is nauseous. Please advise.

## 2019-03-15 ENCOUNTER — Telehealth: Payer: Self-pay | Admitting: *Deleted

## 2019-03-15 ENCOUNTER — Other Ambulatory Visit: Payer: Self-pay

## 2019-03-15 DIAGNOSIS — R110 Nausea: Secondary | ICD-10-CM

## 2019-03-15 MED ORDER — ONDANSETRON HCL 4 MG PO TABS: 4.0000 mg | ORAL_TABLET | Freq: Three times a day (TID) | ORAL | 0 refills | Status: DC | PRN

## 2019-03-15 NOTE — Telephone Encounter (Signed)
Please review

## 2019-03-15 NOTE — Telephone Encounter (Signed)
Patient notified. Medication sent to pharmacy. 

## 2019-03-15 NOTE — Telephone Encounter (Signed)
Patient was seen in office 03/11/2019 and was given rx for Tramadol. Patient states he took one dose of Tramadol and was nauseous. Patient states he did not take any more because he thinks the medication is to strong and wanted to know if he should reduce the dose? Please advise?

## 2019-03-16 ENCOUNTER — Other Ambulatory Visit: Payer: Self-pay

## 2019-03-16 MED ORDER — TAMSULOSIN HCL 0.4 MG PO CAPS: 0.4000 mg | ORAL_CAPSULE | Freq: Every day | ORAL | 0 refills | Status: DC

## 2019-03-23 NOTE — Telephone Encounter (Signed)
Pt called saying he was supposed to get an appt for images to be done on his back but he has not heard anything back  CB#  [phone]  Con Memos

## 2019-03-24 ENCOUNTER — Ambulatory Visit
Admission: RE | Admit: 2019-03-24 | Discharge: 2019-03-24 | Disposition: A | Discharge status: Home / Self Care | Payer: PPO | Source: Ambulatory Visit | Attending: Family Medicine | Admitting: Family Medicine

## 2019-03-24 ENCOUNTER — Ambulatory Visit
Admission: RE | Admit: 2019-03-24 | Discharge: 2019-03-24 | Disposition: A | Discharge status: Home / Self Care | Payer: PPO | Attending: Family Medicine | Admitting: Family Medicine

## 2019-03-24 ENCOUNTER — Other Ambulatory Visit: Payer: Self-pay

## 2019-03-24 DIAGNOSIS — M545 Low back pain, unspecified: Secondary | ICD-10-CM

## 2019-03-25 ENCOUNTER — Telehealth: Payer: Self-pay

## 2019-03-25 NOTE — Telephone Encounter (Signed)
-----   Message from Jerrol Banana., MD sent at 03/25/2019 10:16 AM EDT ----- Arthritic and DDD changes.

## 2019-03-25 NOTE — Telephone Encounter (Signed)
Pt advised.   Thanks,   -Dariush Mcnellis  

## 2019-04-12 DIAGNOSIS — H401132 Primary open-angle glaucoma, bilateral, moderate stage: Secondary | ICD-10-CM | POA: Diagnosis not present

## 2019-04-21 ENCOUNTER — Telehealth: Payer: Self-pay | Admitting: Urology

## 2019-04-21 DIAGNOSIS — R350 Frequency of micturition: Secondary | ICD-10-CM

## 2019-04-21 MED ORDER — TAMSULOSIN HCL 0.4 MG PO CAPS: 0.4000 mg | ORAL_CAPSULE | Freq: Every day | ORAL | 0 refills | Status: DC

## 2019-04-21 NOTE — Telephone Encounter (Signed)
Pt has made appt needed to obtain refill, however pt would like to know if there is any way possible to send in a Rx to cover him until he comes to his appt to get refill at appt. Pt states he really needs Flomax. Please advise. Thanks

## 2019-04-21 NOTE — Telephone Encounter (Signed)
Patient needs to come in for office visit °

## 2019-04-21 NOTE — Telephone Encounter (Signed)
Refilled one month

## 2019-04-21 NOTE — Telephone Encounter (Signed)
Pt called office and I let him know he had 1 month refill to pick up at pharmacy.

## 2019-04-21 NOTE — Telephone Encounter (Signed)
Pt needs refill of Flomax sent to [facility].

## 2019-05-17 ENCOUNTER — Encounter: Payer: Self-pay | Admitting: Urology

## 2019-05-17 ENCOUNTER — Ambulatory Visit: Payer: PPO | Admitting: Urology

## 2019-05-17 ENCOUNTER — Other Ambulatory Visit: Payer: Self-pay

## 2019-05-17 VITALS — BP 128/72 | HR 73 | Ht 74.0 in | Wt 170.0 lb

## 2019-05-17 DIAGNOSIS — R350 Frequency of micturition: Secondary | ICD-10-CM

## 2019-05-17 MED ORDER — TAMSULOSIN HCL 0.4 MG PO CAPS: 0.4000 mg | ORAL_CAPSULE | Freq: Every day | ORAL | 3 refills | Status: DC

## 2019-05-17 NOTE — Progress Notes (Signed)
05/17/2019 9:32 AM   [name] 1940/10/13 [id number]  Referring provider: Jerrol Banana., MD [address] [place],  [place] [address]  Chief Complaint  Patient presents with  . Urinary Frequency    HPI: I was consulted to assess the patient's acute onset of nighttime frequency every 2 hours.  It is now every 3 hours.  He stopped gabapentin which had been added and he had leg edema.  Right leg is normalized.  Left leg much improved.  He is still using TED hose.  Flow is reasonable.  Frequency every 2 hours during the day.  Patient nighttime frequency likely from fluid mobilization.  Role of alpha-blocker versus watchful waiting described.  Patient has an ill-defined right middle discomfort that I did not think was from the genitourinary system.  It comes and goes and is been present for a few weeks.  We did not x-ray him today.  He would like to try Flomax.  Reassess in 6 weeks.  If he normalizes we might want to try him off it in the future.  Today Flow is much better on the medication but still gets up every 2 hours to void.  No infections or blood  Small benign prostate     PMH: Past Medical History:  Diagnosis Date  . AB (asthmatic bronchitis)   . Allergic rhinitis   . Arthropathy   . Asthma   . Cellulitis   . DVT (deep venous thrombosis) ([employer])   . Foreign body in skin   . Glaucoma   . Hx MRSA infection   . Hx MRSA infection   . Hypertension   . Insomnia   . Insomnia   . Mitral valve disorder   . Osteoarthrosis   . Upper back strain     Surgical History: Past Surgical History:  Procedure Laterality Date  . APPENDECTOMY    . BRONCHOSCOPY    . LEFT HEART CATH AND CORONARY ANGIOGRAPHY N/A 08/19/2016   Procedure: Left Heart Cath and Coronary Angiography;  Surgeon: Dionisio David, MD;  Location: [facility];  Service: Cardiovascular;  Laterality: N/A;  . NASAL SINUS SURGERY  09/27/2014    Home Medications:  Allergies as of  05/17/2019      Reactions   Timolol Maleate [timolol Maleate] Shortness Of Breath   Atorvastatin Swelling   Swelling in legs, dizziness   Bactrim [sulfamethoxazole-trimethoprim]    Pt says "it takes his skin pigment off   Budesonide-formoterol Fumarate Other (See Comments)   SYMBICORT Patient reports dizziness and trembling/shaky feeling.   Lisinopril Other (See Comments)   cough Cough      Medication List       Accurate as of May 17, 2019  9:32 AM. If you have any questions, ask your nurse or doctor.        STOP taking these medications   predniSONE 10 MG (48) Tbpk tablet Commonly known as: STERAPRED UNI-PAK 48 TAB Stopped by: Reece Packer, MD     TAKE these medications   Advair HFA 230-21 MCG/ACT inhaler Generic drug: fluticasone-salmeterol Inhale 2 puffs into the lungs 2 (two) times daily.   albuterol 108 (90 Base) MCG/ACT inhaler Commonly known as: VENTOLIN HFA Inhale into the lungs every 6 (six) hours as needed for wheezing or shortness of breath.   azithromycin 250 MG tablet Commonly known as: ZITHROMAX Take 250 mg by mouth daily.   brimonidine 0.15 % ophthalmic solution Commonly known as: ALPHAGAN Place 1  drop into both eyes 2 (two) times daily.   diltiazem 180 MG 24 hr capsule Commonly known as: CARDIZEM CD TAKE 1 CAPSULE BY MOUTH DAILY   dorzolamide 2 % ophthalmic solution Commonly known as: TRUSOPT Place 1 drop into both eyes 2 (two) times daily.   finasteride 5 MG tablet Commonly known as: PROSCAR Take 1 tablet (5 mg total) by mouth daily.   fluticasone 50 MCG/ACT nasal spray Commonly known as: FLONASE Place 2 sprays into both nostrils 2 (two) times daily.   gabapentin 400 MG capsule Commonly known as: NEURONTIN Take 400 mg by mouth daily.   latanoprost 0.005 % ophthalmic solution Commonly known as: XALATAN Place 1 drop into both eyes at bedtime.   Melatonin 5 MG Tabs Take 1 tablet by mouth at bedtime.   montelukast 10 MG  tablet Commonly known as: SINGULAIR Take 10 mg by mouth at bedtime.   omeprazole 20 MG capsule Commonly known as: PRILOSEC TAKE 1 CAPSULE BY MOUTH DAILY   ondansetron 4 MG tablet Commonly known as: Zofran Take 1 tablet (4 mg total) by mouth every 8 (eight) hours as needed for nausea or vomiting.   Spiriva HandiHaler 18 MCG inhalation capsule Generic drug: tiotropium Place 18 mcg into inhaler and inhale daily.   tamsulosin 0.4 MG Caps capsule Commonly known as: FLOMAX Take 1 capsule (0.4 mg total) by mouth daily.   triamcinolone ointment 0.1 % Commonly known as: KENALOG Apply 1 application topically as directed. To cracks on fingers as needed   Xarelto 10 MG Tabs tablet Generic drug: rivaroxaban Take 10 mg by mouth daily.       Allergies:  Allergies  Allergen Reactions  . Timolol Maleate [Timolol Maleate] Shortness Of Breath  . Atorvastatin Swelling    Swelling in legs, dizziness  . Bactrim [Sulfamethoxazole-Trimethoprim]     Pt says "it takes his skin pigment off  . Budesonide-Formoterol Fumarate Other (See Comments)    SYMBICORT Patient reports dizziness and trembling/shaky feeling.  . Lisinopril Other (See Comments)    cough Cough    Family History: Family History  Problem Relation Age of Onset  . Breast cancer Sister   . Heart failure Mother   . Pancreatic cancer Sister   . Alzheimer's disease Father   . Brain cancer Daughter     Social History:  reports that he quit smoking about 60 years ago. His smoking use included cigarettes. He has a 1.50 pack-year smoking history. He has never used smokeless tobacco. He reports current alcohol use of about 5.0 standard drinks of alcohol per week. He reports that he does not use drugs.  ROS: UROLOGY Frequent Urination?: Yes Hard to postpone urination?: No Burning/pain with urination?: No Get up at night to urinate?: No Leakage of urine?: No Urine stream starts and stops?: No Trouble starting stream?: No Do  you have to strain to urinate?: No Blood in urine?: No Urinary tract infection?: No Sexually transmitted disease?: No Injury to kidneys or bladder?: No Painful intercourse?: No Weak stream?: Yes Erection problems?: No Penile pain?: No  Gastrointestinal Nausea?: No Vomiting?: No Indigestion/heartburn?: No Diarrhea?: No Constipation?: No  Constitutional Fever: No Night sweats?: No Weight loss?: No Fatigue?: No  Skin Skin rash/lesions?: No Itching?: No  Eyes Blurred vision?: No Double vision?: No  Ears/Nose/Throat Sore throat?: No Sinus problems?: No  Hematologic/Lymphatic Swollen glands?: No Easy bruising?: No  Cardiovascular Leg swelling?: No Chest pain?: No  Respiratory Cough?: No Shortness of breath?: No  Endocrine Excessive thirst?: No  Musculoskeletal Back pain?: No Joint pain?: No  Neurological Headaches?: No Dizziness?: No  Psychologic Depression?: No Anxiety?: No  Physical Exam: BP 128/72   Pulse 73   Ht 6\' 2"  (1.88 m)   Wt 77.1 kg   BMI 21.83 kg/m   Constitutional:  Alert and oriented, No acute distress.   Laboratory Data: Lab Results  Component Value Date   WBC 6.1 12/08/2018   HGB 14.0 12/08/2018   HCT 41.3 12/08/2018   MCV 97 12/08/2018   PLT 221 12/08/2018    Lab Results  Component Value Date   CREATININE 0.93 12/08/2018    Lab Results  Component Value Date   PSA 3.6 11/17/2013    No results found for: TESTOSTERONE  Lab Results  Component Value Date   HGBA1C 5.4 08/18/2016    Urinalysis    Component Value Date/Time   COLORURINE YELLOW (A) 04/29/2018 1335   APPEARANCEUR CLEAR (A) 04/29/2018 1335   APPEARANCEUR Clear 01/26/2018 1303   LABSPEC 1.014 04/29/2018 1335   LABSPEC 1.012 04/07/2014 1745   PHURINE 7.0 04/29/2018 1335   GLUCOSEU NEGATIVE 04/29/2018 1335   GLUCOSEU Negative 04/07/2014 1745   HGBUR NEGATIVE 04/29/2018 1335   BILIRUBINUR negative 03/11/2019 1157   BILIRUBINUR Negative  01/26/2018 1303   BILIRUBINUR Negative 04/07/2014 1745   KETONESUR 20 (A) 04/29/2018 1335   PROTEINUR Negative 03/11/2019 1157   PROTEINUR NEGATIVE 04/29/2018 1335   UROBILINOGEN 0.2 03/11/2019 1157   NITRITE Negative 03/11/2019 1157   NITRITE NEGATIVE 04/29/2018 1335   LEUKOCYTESUR Negative 03/11/2019 1157   LEUKOCYTESUR Negative 01/26/2018 1303   LEUKOCYTESUR Negative 04/07/2014 1745    Pertinent Imaging:   Assessment & Plan: Flomax prescription renewed and I will see in 1 year  There are no diagnoses linked to this encounter.  No follow-ups on file.  Reece Packer, MD  [facility] [address], [place], [place] [address] [phone]

## 2019-05-18 DIAGNOSIS — J411 Mucopurulent chronic bronchitis: Secondary | ICD-10-CM | POA: Diagnosis not present

## 2019-05-18 DIAGNOSIS — I479 Paroxysmal tachycardia, unspecified: Secondary | ICD-10-CM | POA: Diagnosis not present

## 2019-05-18 DIAGNOSIS — I25119 Atherosclerotic heart disease of native coronary artery with unspecified angina pectoris: Secondary | ICD-10-CM | POA: Diagnosis not present

## 2019-05-18 DIAGNOSIS — I82419 Acute embolism and thrombosis of unspecified femoral vein: Secondary | ICD-10-CM | POA: Diagnosis not present

## 2019-06-01 DIAGNOSIS — R918 Other nonspecific abnormal finding of lung field: Secondary | ICD-10-CM | POA: Diagnosis not present

## 2019-06-01 DIAGNOSIS — J449 Chronic obstructive pulmonary disease, unspecified: Secondary | ICD-10-CM | POA: Diagnosis not present

## 2019-06-01 DIAGNOSIS — R05 Cough: Secondary | ICD-10-CM | POA: Diagnosis not present

## 2019-06-03 DIAGNOSIS — R05 Cough: Secondary | ICD-10-CM | POA: Diagnosis not present

## 2019-07-07 ENCOUNTER — Ambulatory Visit: Payer: PPO | Admitting: Podiatry

## 2019-07-09 DIAGNOSIS — R918 Other nonspecific abnormal finding of lung field: Secondary | ICD-10-CM | POA: Diagnosis not present

## 2019-07-09 DIAGNOSIS — R9389 Abnormal findings on diagnostic imaging of other specified body structures: Secondary | ICD-10-CM | POA: Diagnosis not present

## 2019-07-09 DIAGNOSIS — J189 Pneumonia, unspecified organism: Secondary | ICD-10-CM | POA: Diagnosis not present

## 2019-07-28 ENCOUNTER — Other Ambulatory Visit: Payer: Self-pay

## 2019-07-28 ENCOUNTER — Ambulatory Visit (INDEPENDENT_AMBULATORY_CARE_PROVIDER_SITE_OTHER): Payer: PPO | Admitting: Podiatry

## 2019-07-28 DIAGNOSIS — G5791 Unspecified mononeuropathy of right lower limb: Secondary | ICD-10-CM

## 2019-07-28 DIAGNOSIS — M778 Other enthesopathies, not elsewhere classified: Secondary | ICD-10-CM

## 2019-07-28 NOTE — Progress Notes (Signed)
He presents today chief complaint of pain to this area as he points to the proximal fourth intermetatarsal space and the proximal first intermetatarsal space.  He has the areas marked with a pen today.  Objective: Vital signs are stable he is alert and oriented x3.  There is no erythema edema cellulitis drainage or odor only pain in these areas with palpation.  Assessment: Neuritis and capsulitis.  Plan: I injected these areas today 10 mg Kenalog 5 mg Marcaine point of maximal tenderness each intermetatarsal space.  Follow-up with me in 6 months

## 2019-08-10 DIAGNOSIS — H401132 Primary open-angle glaucoma, bilateral, moderate stage: Secondary | ICD-10-CM | POA: Diagnosis not present

## 2019-08-10 NOTE — Progress Notes (Signed)
Patient: [name] Male    DOB: [birth year]   79 y.o.   MRN: [id number] Visit Date: 08/11/2019  Today's Provider: Wilhemena Durie, MD   Chief Complaint  Patient presents with  . Back Pain   Subjective:     HPI   Patient presents today to follow up for lower back pain. Patient states that his back is sore today but not hurting.  His back pain is doing fine.  He manages it well himself. He has had both Covid shots.  He is actually done well during the pandemic.  Low back pain without sciatica, unspecified back pain laterality, unspecified chronicity From 03/11/2019-Might benefit from PT referral. Given rx's for tramadol and a prednisone taper. X-ray of Lumbar Spine was obtained.  Chronic obstructive pulmonary disease, unspecified COPD type ([place]) From 03/11/2019-no changes.  Pulmonary aspergillosis, allergic bronchopulmonary type ([place]) From 03/11/2019-no changes.  Malnutrition of mild degree ([place]) From 03/11/2019-no changes.  Allergies  Allergen Reactions  . Timolol Maleate [Timolol Maleate] Shortness Of Breath  . Atorvastatin Swelling    Swelling in legs, dizziness  . Bactrim [Sulfamethoxazole-Trimethoprim]     Pt says "it takes his skin pigment off  . Budesonide-Formoterol Fumarate Other (See Comments)    SYMBICORT Patient reports dizziness and trembling/shaky feeling.  . Lisinopril Other (See Comments)    cough Cough     Current Outpatient Medications:  .  ADVAIR HFA [id number] MCG/ACT inhaler, Inhale 2 puffs into the lungs 2 (two) times daily. , Disp: , Rfl:  .  albuterol (PROVENTIL HFA;VENTOLIN HFA) 108 (90 Base) MCG/ACT inhaler, Inhale into the lungs every 6 (six) hours as needed for wheezing or shortness of breath., Disp: , Rfl:  .  azithromycin (ZITHROMAX) 250 MG tablet, Take 250 mg by mouth daily., Disp: , Rfl:  .  brimonidine (ALPHAGAN) 0.15 % ophthalmic solution, Place 1 drop into both eyes 2 (two) times daily. , Disp: , Rfl:  .  diltiazem (CARDIZEM  CD) 180 MG 24 hr capsule, TAKE 1 CAPSULE BY MOUTH DAILY, Disp: 90 capsule, Rfl: 3 .  dorzolamide (TRUSOPT) 2 % ophthalmic solution, Place 1 drop into both eyes 2 (two) times daily. , Disp: , Rfl:  .  finasteride (PROSCAR) 5 MG tablet, Take 1 tablet (5 mg total) by mouth daily., Disp: 30 tablet, Rfl: 11 .  fluticasone (FLONASE) 50 MCG/ACT nasal spray, Place 2 sprays into both nostrils 2 (two) times daily. , Disp: , Rfl:  .  gabapentin (NEURONTIN) 400 MG capsule, Take 400 mg by mouth daily. , Disp: , Rfl:  .  latanoprost (XALATAN) 0.005 % ophthalmic solution, Place 1 drop into both eyes at bedtime. , Disp: , Rfl:  .  Melatonin 5 MG TABS, Take 1 tablet by mouth at bedtime., Disp: , Rfl:  .  montelukast (SINGULAIR) 10 MG tablet, Take 10 mg by mouth at bedtime. , Disp: , Rfl:  .  omeprazole (PRILOSEC) 20 MG capsule, TAKE 1 CAPSULE BY MOUTH DAILY, Disp: 90 capsule, Rfl: 3 .  ondansetron (ZOFRAN) 4 MG tablet, Take 1 tablet (4 mg total) by mouth every 8 (eight) hours as needed for nausea or vomiting., Disp: 30 tablet, Rfl: 0 .  tamsulosin (FLOMAX) 0.4 MG CAPS capsule, Take 1 capsule (0.4 mg total) by mouth daily., Disp: 90 capsule, Rfl: 3 .  tiotropium (SPIRIVA HANDIHALER) 18 MCG inhalation capsule, Place 18 mcg into inhaler and inhale daily. , Disp: , Rfl:  .  triamcinolone ointment (KENALOG) 0.1 %,  Apply 1 application topically as directed. To cracks on fingers as needed, Disp: , Rfl:  .  XARELTO 10 MG TABS tablet, Take 10 mg by mouth daily. , Disp: , Rfl:   Review of Systems  Constitutional: Negative for appetite change, chills and fever.  HENT: Negative.   Eyes: Negative.   Respiratory: Negative for chest tightness, shortness of breath and wheezing.   Cardiovascular: Negative for chest pain and palpitations.  Gastrointestinal: Negative for abdominal pain, nausea and vomiting.  Endocrine: Negative.   Musculoskeletal: Positive for back pain.  Allergic/Immunologic: Negative.   Hematological:  Negative.   Psychiatric/Behavioral: Negative.     Social History   Tobacco Use  . Smoking status: Former Smoker    Packs/day: 0.50    Years: 3.00    Pack years: 1.50    Types: Cigarettes    Quit date: 06/17/1958    Years since quitting: 61.1  . Smokeless tobacco: Never Used  . Tobacco comment: smoking while in college  Substance Use Topics  . Alcohol use: Yes    Alcohol/week: 5.0 standard drinks    Types: 5 Glasses of wine per week      Objective:   There were no vitals taken for this visit. There were no vitals filed for this visit.There is no height or weight on file to calculate BMI.   Physical Exam Vitals reviewed.  HENT:     Head: Normocephalic and atraumatic.     Right Ear: External ear normal.     Left Ear: External ear normal.  Eyes:     General: No scleral icterus. Cardiovascular:     Rate and Rhythm: Normal rate and regular rhythm.     Heart sounds: Normal heart sounds.  Pulmonary:     Effort: Pulmonary effort is normal.     Breath sounds: Normal breath sounds.  Abdominal:     Palpations: Abdomen is soft.     Comments: No CVAT.  Skin:    General: Skin is warm and dry.  Neurological:     General: No focal deficit present.     Mental Status: He is alert and oriented to person, place, and time.     Comments: Straight leg raise negative.  Psychiatric:        Mood and Affect: Mood normal.        Behavior: Behavior normal.        Thought Content: Thought content normal.        Judgment: Judgment normal.      No results found for any visits on 08/11/19.     Assessment & Plan    1. CAFL (chronic airflow limitation) (HCC) Clinically stable.  Patient has had both Covid vaccines.  2. Recurrent pneumonia No recurrence in the last year.  3. BPH associated with nocturia   4. Paroxysmal tachycardia (HCC) Good control.  5. Essential hypertension Doing well on diltiazem.     Richard Cranford Mon, MD  [facility]

## 2019-08-11 ENCOUNTER — Other Ambulatory Visit: Payer: Self-pay

## 2019-08-11 ENCOUNTER — Encounter: Payer: Self-pay | Admitting: Family Medicine

## 2019-08-11 ENCOUNTER — Ambulatory Visit (INDEPENDENT_AMBULATORY_CARE_PROVIDER_SITE_OTHER): Payer: PPO | Admitting: Family Medicine

## 2019-08-11 VITALS — BP 135/78 | HR 61 | Temp 97.3°F | Wt 170.0 lb

## 2019-08-11 DIAGNOSIS — J449 Chronic obstructive pulmonary disease, unspecified: Secondary | ICD-10-CM | POA: Diagnosis not present

## 2019-08-11 DIAGNOSIS — R351 Nocturia: Secondary | ICD-10-CM | POA: Diagnosis not present

## 2019-08-11 DIAGNOSIS — N401 Enlarged prostate with lower urinary tract symptoms: Secondary | ICD-10-CM | POA: Diagnosis not present

## 2019-08-11 DIAGNOSIS — I479 Paroxysmal tachycardia, unspecified: Secondary | ICD-10-CM | POA: Diagnosis not present

## 2019-08-11 DIAGNOSIS — J189 Pneumonia, unspecified organism: Secondary | ICD-10-CM | POA: Diagnosis not present

## 2019-08-11 DIAGNOSIS — R0602 Shortness of breath: Secondary | ICD-10-CM

## 2019-08-11 DIAGNOSIS — J3089 Other allergic rhinitis: Secondary | ICD-10-CM

## 2019-08-11 DIAGNOSIS — I10 Essential (primary) hypertension: Secondary | ICD-10-CM | POA: Diagnosis not present

## 2019-08-11 MED ORDER — DILTIAZEM HCL ER COATED BEADS 180 MG PO CP24: 180.0000 mg | ORAL_CAPSULE | Freq: Every day | ORAL | 3 refills | Status: DC

## 2019-08-11 MED ORDER — MONTELUKAST SODIUM 10 MG PO TABS: 10.0000 mg | ORAL_TABLET | Freq: Every day | ORAL | 3 refills | Status: DC

## 2019-08-11 MED ORDER — FINASTERIDE 5 MG PO TABS: 5.0000 mg | ORAL_TABLET | Freq: Every day | ORAL | 11 refills | Status: DC

## 2019-08-12 DIAGNOSIS — Z85828 Personal history of other malignant neoplasm of skin: Secondary | ICD-10-CM | POA: Diagnosis not present

## 2019-08-12 DIAGNOSIS — S41112A Laceration without foreign body of left upper arm, initial encounter: Secondary | ICD-10-CM | POA: Diagnosis not present

## 2019-08-12 DIAGNOSIS — I872 Venous insufficiency (chronic) (peripheral): Secondary | ICD-10-CM | POA: Diagnosis not present

## 2019-08-12 DIAGNOSIS — L570 Actinic keratosis: Secondary | ICD-10-CM | POA: Diagnosis not present

## 2019-08-12 DIAGNOSIS — D692 Other nonthrombocytopenic purpura: Secondary | ICD-10-CM | POA: Diagnosis not present

## 2019-08-12 DIAGNOSIS — L299 Pruritus, unspecified: Secondary | ICD-10-CM | POA: Diagnosis not present

## 2019-08-12 DIAGNOSIS — Z1283 Encounter for screening for malignant neoplasm of skin: Secondary | ICD-10-CM | POA: Diagnosis not present

## 2019-08-12 DIAGNOSIS — D1801 Hemangioma of skin and subcutaneous tissue: Secondary | ICD-10-CM | POA: Diagnosis not present

## 2019-08-12 DIAGNOSIS — L578 Other skin changes due to chronic exposure to nonionizing radiation: Secondary | ICD-10-CM | POA: Diagnosis not present

## 2019-08-12 DIAGNOSIS — S41111A Laceration without foreign body of right upper arm, initial encounter: Secondary | ICD-10-CM | POA: Diagnosis not present

## 2019-08-16 DIAGNOSIS — I8290 Acute embolism and thrombosis of unspecified vein: Secondary | ICD-10-CM | POA: Diagnosis not present

## 2019-08-16 DIAGNOSIS — H401132 Primary open-angle glaucoma, bilateral, moderate stage: Secondary | ICD-10-CM | POA: Diagnosis not present

## 2019-08-16 DIAGNOSIS — Z7901 Long term (current) use of anticoagulants: Secondary | ICD-10-CM | POA: Diagnosis not present

## 2019-08-25 DIAGNOSIS — H2512 Age-related nuclear cataract, left eye: Secondary | ICD-10-CM | POA: Diagnosis not present

## 2019-08-25 DIAGNOSIS — J449 Chronic obstructive pulmonary disease, unspecified: Secondary | ICD-10-CM | POA: Diagnosis not present

## 2019-08-26 ENCOUNTER — Other Ambulatory Visit: Payer: Self-pay

## 2019-08-26 ENCOUNTER — Encounter: Payer: Self-pay | Admitting: Ophthalmology

## 2019-08-30 ENCOUNTER — Other Ambulatory Visit
Admission: RE | Admit: 2019-08-30 | Discharge: 2019-08-30 | Disposition: A | Discharge status: Home / Self Care | Payer: PPO | Source: Ambulatory Visit | Attending: Ophthalmology | Admitting: Ophthalmology

## 2019-08-30 DIAGNOSIS — Z20822 Contact with and (suspected) exposure to covid-19: Secondary | ICD-10-CM | POA: Diagnosis not present

## 2019-08-30 DIAGNOSIS — Z01812 Encounter for preprocedural laboratory examination: Secondary | ICD-10-CM | POA: Diagnosis not present

## 2019-08-30 LAB — SARS CORONAVIRUS 2 (TAT 6-24 HRS): SARS Coronavirus 2: NEGATIVE

## 2019-08-30 NOTE — Discharge Instructions (Signed)

## 2019-09-01 ENCOUNTER — Ambulatory Visit: Payer: PPO | Admitting: Anesthesiology

## 2019-09-01 ENCOUNTER — Ambulatory Visit
Admission: RE | Admit: 2019-09-01 | Discharge: 2019-09-01 | Disposition: A | Discharge status: Home / Self Care | Payer: PPO | Attending: Ophthalmology | Admitting: Ophthalmology

## 2019-09-01 ENCOUNTER — Encounter
Admission: RE | Disposition: A | Discharge status: Home / Self Care | Payer: Self-pay | Source: Home / Self Care | Attending: Ophthalmology

## 2019-09-01 ENCOUNTER — Other Ambulatory Visit: Payer: Self-pay

## 2019-09-01 ENCOUNTER — Encounter: Payer: Self-pay | Admitting: Ophthalmology

## 2019-09-01 DIAGNOSIS — H409 Unspecified glaucoma: Secondary | ICD-10-CM | POA: Diagnosis not present

## 2019-09-01 DIAGNOSIS — Z87891 Personal history of nicotine dependence: Secondary | ICD-10-CM | POA: Diagnosis not present

## 2019-09-01 DIAGNOSIS — N400 Enlarged prostate without lower urinary tract symptoms: Secondary | ICD-10-CM | POA: Insufficient documentation

## 2019-09-01 DIAGNOSIS — H2512 Age-related nuclear cataract, left eye: Secondary | ICD-10-CM | POA: Insufficient documentation

## 2019-09-01 DIAGNOSIS — Z86718 Personal history of other venous thrombosis and embolism: Secondary | ICD-10-CM | POA: Insufficient documentation

## 2019-09-01 DIAGNOSIS — Z8614 Personal history of Methicillin resistant Staphylococcus aureus infection: Secondary | ICD-10-CM | POA: Diagnosis not present

## 2019-09-01 DIAGNOSIS — M1990 Unspecified osteoarthritis, unspecified site: Secondary | ICD-10-CM | POA: Diagnosis not present

## 2019-09-01 DIAGNOSIS — J449 Chronic obstructive pulmonary disease, unspecified: Secondary | ICD-10-CM | POA: Diagnosis not present

## 2019-09-01 DIAGNOSIS — I10 Essential (primary) hypertension: Secondary | ICD-10-CM | POA: Insufficient documentation

## 2019-09-01 DIAGNOSIS — I252 Old myocardial infarction: Secondary | ICD-10-CM | POA: Diagnosis not present

## 2019-09-01 DIAGNOSIS — I479 Paroxysmal tachycardia, unspecified: Secondary | ICD-10-CM | POA: Diagnosis not present

## 2019-09-01 DIAGNOSIS — I2510 Atherosclerotic heart disease of native coronary artery without angina pectoris: Secondary | ICD-10-CM | POA: Insufficient documentation

## 2019-09-01 DIAGNOSIS — H25812 Combined forms of age-related cataract, left eye: Secondary | ICD-10-CM | POA: Diagnosis not present

## 2019-09-01 DIAGNOSIS — Z888 Allergy status to other drugs, medicaments and biological substances status: Secondary | ICD-10-CM | POA: Insufficient documentation

## 2019-09-01 HISTORY — DX: Gastro-esophageal reflux disease without esophagitis: K21.9

## 2019-09-01 HISTORY — PX: CATARACT EXTRACTION W/PHACO: SHX586

## 2019-09-01 SURGERY — PHACOEMULSIFICATION, CATARACT, WITH IOL INSERTION
Anesthesia: Monitor Anesthesia Care | Site: Eye | Laterality: Left

## 2019-09-01 MED ORDER — LIDOCAINE HCL (PF) 2 % IJ SOLN
INTRAOCULAR | Status: DC | PRN
  Administered 2019-09-01: 2 mL

## 2019-09-01 MED ORDER — CEFUROXIME OPHTHALMIC INJECTION 1 MG/0.1 ML
INJECTION | OPHTHALMIC | Status: DC | PRN
  Administered 2019-09-01: 0.1 mL via INTRACAMERAL

## 2019-09-01 MED ORDER — OXYCODONE HCL 5 MG/5ML PO SOLN: 5.0000 mg | Freq: Once | ORAL | Status: DC | PRN

## 2019-09-01 MED ORDER — MIDAZOLAM HCL 2 MG/2ML IJ SOLN
INTRAMUSCULAR | Status: DC | PRN
  Administered 2019-09-01: 2 mg via INTRAVENOUS

## 2019-09-01 MED ORDER — MOXIFLOXACIN HCL 0.5 % OP SOLN
1.0000 [drp] | OPHTHALMIC | Status: DC | PRN
  Administered 2019-09-01 (×3): 1 [drp] via OPHTHALMIC

## 2019-09-01 MED ORDER — FENTANYL CITRATE (PF) 100 MCG/2ML IJ SOLN
INTRAMUSCULAR | Status: DC | PRN
  Administered 2019-09-01 (×2): 50 ug via INTRAVENOUS

## 2019-09-01 MED ORDER — LACTATED RINGERS IV SOLN: INTRAVENOUS | Status: DC

## 2019-09-01 MED ORDER — OXYCODONE HCL 5 MG PO TABS: 5.0000 mg | ORAL_TABLET | Freq: Once | ORAL | Status: DC | PRN

## 2019-09-01 MED ORDER — TETRACAINE HCL 0.5 % OP SOLN
1.0000 [drp] | OPHTHALMIC | Status: DC | PRN
  Administered 2019-09-01 (×3): 1 [drp] via OPHTHALMIC

## 2019-09-01 MED ORDER — ARMC OPHTHALMIC DILATING DROPS
1.0000 "application " | OPHTHALMIC | Status: DC | PRN
  Administered 2019-09-01 (×3): 1 via OPHTHALMIC

## 2019-09-01 MED ORDER — NA HYALUR & NA CHOND-NA HYALUR 0.4-0.35 ML IO KIT
PACK | INTRAOCULAR | Status: DC | PRN
  Administered 2019-09-01: 1 mL via INTRAOCULAR

## 2019-09-01 MED ORDER — EPINEPHRINE PF 1 MG/ML IJ SOLN
INTRAOCULAR | Status: DC | PRN
  Administered 2019-09-01: 76 mL via OPHTHALMIC

## 2019-09-01 SURGICAL SUPPLY — 22 items
CANNULA ANT/CHMB 27G (MISCELLANEOUS) ×1 IMPLANT
CANNULA ANT/CHMB 27GA (MISCELLANEOUS) ×3 IMPLANT
GLOVE SURG LX 7.5 STRW (GLOVE) ×2
GLOVE SURG LX STRL 7.5 STRW (GLOVE) ×1 IMPLANT
GLOVE SURG TRIUMPH 8.0 PF LTX (GLOVE) ×3 IMPLANT
GOWN STRL REUS W/ TWL LRG LVL3 (GOWN DISPOSABLE) ×2 IMPLANT
GOWN STRL REUS W/TWL LRG LVL3 (GOWN DISPOSABLE) ×6
LENS IOL TECNIS ITEC 21.5 (Intraocular Lens) ×2 IMPLANT
MARKER SKIN DUAL TIP RULER LAB (MISCELLANEOUS) ×3 IMPLANT
NDL CAPSULORHEX 25GA (NEEDLE) ×1 IMPLANT
NDL FILTER BLUNT 18X1 1/2 (NEEDLE) ×2 IMPLANT
NEEDLE CAPSULORHEX 25GA (NEEDLE) ×3 IMPLANT
NEEDLE FILTER BLUNT 18X 1/2SAF (NEEDLE) ×4
NEEDLE FILTER BLUNT 18X1 1/2 (NEEDLE) ×2 IMPLANT
PACK CATARACT BRASINGTON (MISCELLANEOUS) ×3 IMPLANT
PACK EYE AFTER SURG (MISCELLANEOUS) ×3 IMPLANT
PACK OPTHALMIC (MISCELLANEOUS) ×3 IMPLANT
SOLUTION OPHTHALMIC SALT (MISCELLANEOUS) ×3 IMPLANT
SYR 3ML LL SCALE MARK (SYRINGE) ×6 IMPLANT
SYR TB 1ML LUER SLIP (SYRINGE) ×3 IMPLANT
WATER STERILE IRR 250ML POUR (IV SOLUTION) ×3 IMPLANT
WIPE NON LINTING 3.25X3.25 (MISCELLANEOUS) ×3 IMPLANT

## 2019-09-01 NOTE — H&P (Signed)

## 2019-09-01 NOTE — Anesthesia Preprocedure Evaluation (Signed)
Anesthesia Evaluation  Patient identified by MRN, date of birth, ID band Patient awake    Reviewed: NPO status   History of Anesthesia Complications Negative for: history of anesthetic complications  Airway Mallampati: II  TM Distance: >3 FB Neck ROM: full    Dental no notable dental hx.    Pulmonary asthma (mild) , COPD,  COPD inhaler, former smoker,    Pulmonary exam normal        Cardiovascular Exercise Tolerance: Good hypertension, + Past MI (nstemi) and + DVT (2019 > on xarelto)  Normal cardiovascular exam+ dysrhythmias (Paroxysmal tachycardia )   cards stable: 05/2019: [place]: non-obstructive CAD, treated medically, paroxsymal tachycardia, and history of a DVT;  Left heart catheterization on 08/19/16 revealed a mid RCA lesion, 50% stenosed, with no other significant disease. Medical management recommended;   Echocardiogram in 2018 revealed low normal LV systolic function, an EF estimated at 53%, with mild MR, mild TR, and trace AI;  On Abx for chronic 'lung infection'   Neuro/Psych Ddd;  Glaucoma; negative psych ROS   GI/Hepatic Neg liver ROS, GERD  Controlled,  Endo/Other  negative endocrine ROS  Renal/GU negative Renal ROS   bph    Musculoskeletal  (+) Arthritis ,   Abdominal   Peds  Hematology negative hematology ROS (+)   Anesthesia Other Findings Covid: NEG. last xarelto: 3/16;  Reproductive/Obstetrics                             Anesthesia Physical Anesthesia Plan  ASA: III  Anesthesia Plan: MAC   Post-op Pain Management:    Induction:   PONV Risk Score and Plan: 1 and TIVA  Airway Management Planned:   Additional Equipment:   Intra-op Plan:   Post-operative Plan:   Informed Consent: I have reviewed the patients History and Physical, chart, labs and discussed the procedure including the risks, benefits and alternatives for the proposed anesthesia  with the patient or authorized representative who has indicated his/her understanding and acceptance.       Plan Discussed with: CRNA  Anesthesia Plan Comments:         Anesthesia Quick Evaluation

## 2019-09-01 NOTE — Transfer of Care (Signed)
Immediate Anesthesia Transfer of Care Note  Patient: [name]  Procedure(s) Performed: CATARACT EXTRACTION PHACO AND INTRAOCULAR LENS PLACEMENT (IOC) LEFT 11.72 01:12.2 16.2% (Left Eye)  Patient Location: PACU  Anesthesia Type: MAC  Level of Consciousness: awake, alert  and patient cooperative  Airway and Oxygen Therapy: Patient Spontanous Breathing and Patient connected to supplemental oxygen  Post-op Assessment: Post-op Vital signs reviewed, Patient's Cardiovascular Status Stable, Respiratory Function Stable, Patent Airway and No signs of Nausea or vomiting  Post-op Vital Signs: Reviewed and stable  Complications: No apparent anesthesia complications

## 2019-09-01 NOTE — Anesthesia Procedure Notes (Signed)
Procedure Name: MAC Date/Time: 09/01/2019 11:31 AM Performed by: Jeannene Patella, CRNA Pre-anesthesia Checklist: Patient identified, Emergency Drugs available, Suction available, Patient being monitored and Timeout performed Patient Re-evaluated:Patient Re-evaluated prior to induction Oxygen Delivery Method: Nasal cannula

## 2019-09-01 NOTE — Anesthesia Postprocedure Evaluation (Signed)
Anesthesia Post Note  Patient: [name]  Procedure(s) Performed: CATARACT EXTRACTION PHACO AND INTRAOCULAR LENS PLACEMENT (IOC) LEFT 11.72 01:12.2 16.2% (Left Eye)     Patient location during evaluation: PACU Anesthesia Type: MAC Level of consciousness: awake and alert Pain management: pain level controlled Vital Signs Assessment: post-procedure vital signs reviewed and stable Respiratory status: spontaneous breathing, nonlabored ventilation, respiratory function stable and patient connected to nasal cannula oxygen Cardiovascular status: stable and blood pressure returned to baseline Postop Assessment: no apparent nausea or vomiting Anesthetic complications: no    Zarie Kosiba

## 2019-09-01 NOTE — Op Note (Signed)
OPERATIVE NOTE  [name] [id number] 09/01/2019   PREOPERATIVE DIAGNOSIS:  Nuclear sclerotic cataract left eye. H25.12   POSTOPERATIVE DIAGNOSIS:    Nuclear sclerotic cataract left eye.     PROCEDURE:  Phacoemusification with posterior chamber intraocular lens placement of the left eye  Ultrasound time: Procedure(s): CATARACT EXTRACTION PHACO AND INTRAOCULAR LENS PLACEMENT (IOC) LEFT 11.72 01:12.2 16.2% (Left)  LENS:   Implant Name Type Inv. Item Serial No. Manufacturer Lot No. LRB No. Used Action  LENS IOL DIOP 21.5 - [id number] Intraocular Lens LENS IOL DIOP 21.5 [id number]  Left 1 Implanted      SURGEON:  Wyonia Hough, MD   ANESTHESIA:  Topical with tetracaine drops and 2% Xylocaine jelly, augmented with 1% preservative-free intracameral lidocaine.    COMPLICATIONS:  None.   DESCRIPTION OF PROCEDURE:  The patient was identified in the holding room and transported to the operating room and placed in the supine position under the operating microscope.  The left eye was identified as the operative eye and it was prepped and draped in the usual sterile ophthalmic fashion.   A 1 millimeter clear-corneal paracentesis was made at the 1:30 position.  0.5 ml of preservative-free 1% lidocaine was injected into the anterior chamber.  The anterior chamber was filled with Viscoat viscoelastic.  A 2.4 millimeter keratome was used to make a near-clear corneal incision at the 10:30 position.  .  A curvilinear capsulorrhexis was made with a cystotome and capsulorrhexis forceps.  Balanced salt solution was used to hydrodissect and hydrodelineate the nucleus.   Phacoemulsification was then used in stop and chop fashion to remove the lens nucleus and epinucleus.  The remaining cortex was then removed using the irrigation and aspiration handpiece. Provisc was then placed into the capsular bag to distend it for lens placement.  A lens was then injected into the capsular bag.  The  remaining viscoelastic was aspirated.   Wounds were hydrated with balanced salt solution.  The anterior chamber was inflated to a physiologic pressure with balanced salt solution.  No wound leaks were noted. Cefuroxime 0.1 ml of a 10mg /ml solution was injected into the anterior chamber for a dose of 1 mg of intracameral antibiotic at the completion of the case.   he patient was taken to the recovery room in stable condition without complications of anesthesia or surgery.  [name] 09/01/2019, 11:54 AM

## 2019-09-02 ENCOUNTER — Encounter: Payer: Self-pay | Admitting: *Deleted

## 2019-09-13 ENCOUNTER — Encounter: Payer: Self-pay | Admitting: Ophthalmology

## 2019-09-13 DIAGNOSIS — J449 Chronic obstructive pulmonary disease, unspecified: Secondary | ICD-10-CM | POA: Diagnosis not present

## 2019-09-13 DIAGNOSIS — H2511 Age-related nuclear cataract, right eye: Secondary | ICD-10-CM | POA: Diagnosis not present

## 2019-09-16 NOTE — Discharge Instructions (Signed)

## 2019-09-20 ENCOUNTER — Other Ambulatory Visit: Payer: Self-pay

## 2019-09-20 ENCOUNTER — Other Ambulatory Visit
Admission: RE | Admit: 2019-09-20 | Discharge: 2019-09-20 | Disposition: A | Discharge status: Home / Self Care | Payer: PPO | Source: Ambulatory Visit | Attending: Ophthalmology | Admitting: Ophthalmology

## 2019-09-20 DIAGNOSIS — Z20822 Contact with and (suspected) exposure to covid-19: Secondary | ICD-10-CM | POA: Insufficient documentation

## 2019-09-20 DIAGNOSIS — Z01812 Encounter for preprocedural laboratory examination: Secondary | ICD-10-CM | POA: Diagnosis not present

## 2019-09-20 LAB — SARS CORONAVIRUS 2 (TAT 6-24 HRS): SARS Coronavirus 2: NEGATIVE

## 2019-09-22 ENCOUNTER — Other Ambulatory Visit: Payer: Self-pay

## 2019-09-22 ENCOUNTER — Encounter: Payer: Self-pay | Admitting: Ophthalmology

## 2019-09-22 ENCOUNTER — Ambulatory Visit: Payer: PPO | Admitting: Anesthesiology

## 2019-09-22 ENCOUNTER — Ambulatory Visit
Admission: RE | Admit: 2019-09-22 | Discharge: 2019-09-22 | Disposition: A | Discharge status: Home / Self Care | Payer: PPO | Attending: Ophthalmology | Admitting: Ophthalmology

## 2019-09-22 ENCOUNTER — Encounter
Admission: RE | Disposition: A | Discharge status: Home / Self Care | Payer: Self-pay | Source: Home / Self Care | Attending: Ophthalmology

## 2019-09-22 DIAGNOSIS — Z87891 Personal history of nicotine dependence: Secondary | ICD-10-CM | POA: Diagnosis not present

## 2019-09-22 DIAGNOSIS — N400 Enlarged prostate without lower urinary tract symptoms: Secondary | ICD-10-CM | POA: Insufficient documentation

## 2019-09-22 DIAGNOSIS — K219 Gastro-esophageal reflux disease without esophagitis: Secondary | ICD-10-CM | POA: Diagnosis not present

## 2019-09-22 DIAGNOSIS — I10 Essential (primary) hypertension: Secondary | ICD-10-CM | POA: Diagnosis not present

## 2019-09-22 DIAGNOSIS — J449 Chronic obstructive pulmonary disease, unspecified: Secondary | ICD-10-CM | POA: Insufficient documentation

## 2019-09-22 DIAGNOSIS — H409 Unspecified glaucoma: Secondary | ICD-10-CM | POA: Insufficient documentation

## 2019-09-22 DIAGNOSIS — I479 Paroxysmal tachycardia, unspecified: Secondary | ICD-10-CM | POA: Diagnosis not present

## 2019-09-22 DIAGNOSIS — M1990 Unspecified osteoarthritis, unspecified site: Secondary | ICD-10-CM | POA: Insufficient documentation

## 2019-09-22 DIAGNOSIS — K449 Diaphragmatic hernia without obstruction or gangrene: Secondary | ICD-10-CM | POA: Diagnosis not present

## 2019-09-22 DIAGNOSIS — H2511 Age-related nuclear cataract, right eye: Secondary | ICD-10-CM | POA: Diagnosis not present

## 2019-09-22 DIAGNOSIS — I252 Old myocardial infarction: Secondary | ICD-10-CM | POA: Insufficient documentation

## 2019-09-22 DIAGNOSIS — Z888 Allergy status to other drugs, medicaments and biological substances status: Secondary | ICD-10-CM | POA: Insufficient documentation

## 2019-09-22 DIAGNOSIS — Z86718 Personal history of other venous thrombosis and embolism: Secondary | ICD-10-CM | POA: Insufficient documentation

## 2019-09-22 DIAGNOSIS — Z8614 Personal history of Methicillin resistant Staphylococcus aureus infection: Secondary | ICD-10-CM | POA: Diagnosis not present

## 2019-09-22 DIAGNOSIS — H25811 Combined forms of age-related cataract, right eye: Secondary | ICD-10-CM | POA: Diagnosis not present

## 2019-09-22 HISTORY — PX: CATARACT EXTRACTION W/PHACO: SHX586

## 2019-09-22 SURGERY — PHACOEMULSIFICATION, CATARACT, WITH IOL INSERTION
Anesthesia: Monitor Anesthesia Care | Site: Eye | Laterality: Right

## 2019-09-22 MED ORDER — FENTANYL CITRATE (PF) 100 MCG/2ML IJ SOLN
INTRAMUSCULAR | Status: DC | PRN
  Administered 2019-09-22 (×2): 50 ug via INTRAVENOUS

## 2019-09-22 MED ORDER — EPINEPHRINE PF 1 MG/ML IJ SOLN
INTRAOCULAR | Status: DC | PRN
  Administered 2019-09-22: 78 mL via OPHTHALMIC

## 2019-09-22 MED ORDER — ARMC OPHTHALMIC DILATING DROPS
1.0000 "application " | OPHTHALMIC | Status: DC | PRN
  Administered 2019-09-22 (×3): 1 via OPHTHALMIC

## 2019-09-22 MED ORDER — NA HYALUR & NA CHOND-NA HYALUR 0.4-0.35 ML IO KIT
PACK | INTRAOCULAR | Status: DC | PRN
  Administered 2019-09-22: 1 mL via INTRAOCULAR

## 2019-09-22 MED ORDER — MOXIFLOXACIN HCL 0.5 % OP SOLN
1.0000 [drp] | OPHTHALMIC | Status: DC | PRN
  Administered 2019-09-22 (×3): 1 [drp] via OPHTHALMIC

## 2019-09-22 MED ORDER — CEFUROXIME OPHTHALMIC INJECTION 1 MG/0.1 ML
INJECTION | OPHTHALMIC | Status: DC | PRN
  Administered 2019-09-22: 0.1 mL via INTRACAMERAL

## 2019-09-22 MED ORDER — BRIMONIDINE TARTRATE-TIMOLOL 0.2-0.5 % OP SOLN
OPHTHALMIC | Status: DC | PRN
  Administered 2019-09-22: 1 [drp] via OPHTHALMIC

## 2019-09-22 MED ORDER — MIDAZOLAM HCL 2 MG/2ML IJ SOLN
INTRAMUSCULAR | Status: DC | PRN
  Administered 2019-09-22: 2 mg via INTRAVENOUS

## 2019-09-22 MED ORDER — LIDOCAINE HCL (PF) 2 % IJ SOLN
INTRAOCULAR | Status: DC | PRN
  Administered 2019-09-22: 11:00:00 1 mL

## 2019-09-22 MED ORDER — TETRACAINE HCL 0.5 % OP SOLN
1.0000 [drp] | OPHTHALMIC | Status: DC | PRN
  Administered 2019-09-22 (×3): 1 [drp] via OPHTHALMIC

## 2019-09-22 SURGICAL SUPPLY — 23 items
CANNULA ANT/CHMB 27G (MISCELLANEOUS) ×1 IMPLANT
CANNULA ANT/CHMB 27GA (MISCELLANEOUS) ×3 IMPLANT
GLOVE SURG LX 7.5 STRW (GLOVE) ×2
GLOVE SURG LX STRL 7.5 STRW (GLOVE) ×1 IMPLANT
GLOVE SURG TRIUMPH 8.0 PF LTX (GLOVE) ×3 IMPLANT
GOWN STRL REUS W/ TWL LRG LVL3 (GOWN DISPOSABLE) ×2 IMPLANT
GOWN STRL REUS W/TWL LRG LVL3 (GOWN DISPOSABLE) ×6
LENS IOL DIOP 21.5 (Intraocular Lens) ×3 IMPLANT
LENS IOL TECNIS MONO 21.5 (Intraocular Lens) IMPLANT
MARKER SKIN DUAL TIP RULER LAB (MISCELLANEOUS) ×3 IMPLANT
NDL CAPSULORHEX 25GA (NEEDLE) ×1 IMPLANT
NDL FILTER BLUNT 18X1 1/2 (NEEDLE) ×2 IMPLANT
NEEDLE CAPSULORHEX 25GA (NEEDLE) ×3 IMPLANT
NEEDLE FILTER BLUNT 18X 1/2SAF (NEEDLE) ×4
NEEDLE FILTER BLUNT 18X1 1/2 (NEEDLE) ×2 IMPLANT
PACK CATARACT BRASINGTON (MISCELLANEOUS) ×3 IMPLANT
PACK EYE AFTER SURG (MISCELLANEOUS) ×3 IMPLANT
PACK OPTHALMIC (MISCELLANEOUS) ×3 IMPLANT
SOLUTION OPHTHALMIC SALT (MISCELLANEOUS) ×3 IMPLANT
SYR 3ML LL SCALE MARK (SYRINGE) ×6 IMPLANT
SYR TB 1ML LUER SLIP (SYRINGE) ×3 IMPLANT
WATER STERILE IRR 250ML POUR (IV SOLUTION) ×3 IMPLANT
WIPE NON LINTING 3.25X3.25 (MISCELLANEOUS) ×3 IMPLANT

## 2019-09-22 NOTE — Anesthesia Preprocedure Evaluation (Signed)
Anesthesia Evaluation  Patient identified by MRN, date of birth, ID band Patient awake    Reviewed: NPO status   History of Anesthesia Complications Negative for: history of anesthetic complications  Airway Mallampati: I  TM Distance: >3 FB Neck ROM: full    Dental no notable dental hx.    Pulmonary COPD,  COPD inhaler, former smoker,    Pulmonary exam normal        Cardiovascular Exercise Tolerance: Good hypertension, + Past MI (nstemi) and + DVT (2019 > on xarelto)  Normal cardiovascular exam+ dysrhythmias (Paroxysmal tachycardia )   cards stable: 05/2019: [place]: non-obstructive CAD, treated medically, paroxsymal tachycardia, and history of a DVT;  Left heart catheterization on 08/19/16 revealed a mid RCA lesion, 50% stenosed, with no other significant disease. Medical management recommended;   Echocardiogram in 2018 revealed low normal LV systolic function, an EF estimated at 53%, with mild MR, mild TR, and trace AI   Neuro/Psych Ddd;  Glaucoma; negative psych ROS   GI/Hepatic Neg liver ROS, GERD  Controlled,  Endo/Other  negative endocrine ROS  Renal/GU negative Renal ROS   bph    Musculoskeletal  (+) Arthritis ,   Abdominal   Peds  Hematology negative hematology ROS (+)   Anesthesia Other Findings  last xarelto;  Reproductive/Obstetrics                           Anesthesia Physical  Anesthesia Plan  ASA: III  Anesthesia Plan: MAC   Post-op Pain Management:    Induction:   PONV Risk Score and Plan: 1 and TIVA  Airway Management Planned:   Additional Equipment:   Intra-op Plan:   Post-operative Plan:   Informed Consent: I have reviewed the patients History and Physical, chart, labs and discussed the procedure including the risks, benefits and alternatives for the proposed anesthesia with the patient or authorized representative who has indicated his/her  understanding and acceptance.       Plan Discussed with: CRNA  Anesthesia Plan Comments:         Anesthesia Quick Evaluation

## 2019-09-22 NOTE — Op Note (Signed)
LOCATION:  [place]   PREOPERATIVE DIAGNOSIS:    Nuclear sclerotic cataract right eye. H25.11   POSTOPERATIVE DIAGNOSIS:  Nuclear sclerotic cataract right eye.     PROCEDURE:  Phacoemusification with posterior chamber intraocular lens placement of the right eye   ULTRASOUND TIME: Procedure(s) with comments: CATARACT EXTRACTION PHACO AND INTRAOCULAR LENS PLACEMENT (IOC) RIGHT (Right) - 6.88 103.4 10.9%  LENS:   Implant Name Type Inv. Item Serial No. Manufacturer Lot No. LRB No. Used Action  LENS IOL DIOP 21.5 - [id number] Intraocular Lens LENS IOL DIOP 21.5 [id number]  Right 1 Implanted         SURGEON:  Wyonia Hough, MD   ANESTHESIA:  Topical with tetracaine drops and 2% Xylocaine jelly, augmented with 1% preservative-free intracameral lidocaine.    COMPLICATIONS:  None.   DESCRIPTION OF PROCEDURE:  The patient was identified in the holding room and transported to the operating room and placed in the supine position under the operating microscope.  The right eye was identified as the operative eye and it was prepped and draped in the usual sterile ophthalmic fashion.   A 1 millimeter clear-corneal paracentesis was made at the 12:00 position.  0.5 ml of preservative-free 1% lidocaine was injected into the anterior chamber. The anterior chamber was filled with Viscoat viscoelastic.  A 2.4 millimeter keratome was used to make a near-clear corneal incision at the 9:00 position.  A curvilinear capsulorrhexis was made with a cystotome and capsulorrhexis forceps.  Balanced salt solution was used to hydrodissect and hydrodelineate the nucleus.   Phacoemulsification was then used in stop and chop fashion to remove the lens nucleus and epinucleus.  The remaining cortex was then removed using the irrigation and aspiration handpiece. Provisc was then placed into the capsular bag to distend it for lens placement.  A lens was then injected into the capsular bag.  The  remaining viscoelastic was aspirated.   Wounds were hydrated with balanced salt solution.  The anterior chamber was inflated to a physiologic pressure with balanced salt solution.  No wound leaks were noted. Cefuroxime 0.1 ml of a 10mg /ml solution was injected into the anterior chamber for a dose of 1 mg of intracameral antibiotic at the completion of the case.   Timolol and Brimonidine drops were applied to the eye.  The patient was taken to the recovery room in stable condition without complications of anesthesia or surgery.   Wilkins Elpers 09/22/2019, 10:55 AM

## 2019-09-22 NOTE — H&P (Signed)

## 2019-09-22 NOTE — Anesthesia Procedure Notes (Signed)
Procedure Name: MAC Performed by: Kisean Rollo M, CRNA Pre-anesthesia Checklist: Timeout performed, Patient being monitored, Suction available, Emergency Drugs available and Patient identified Patient Re-evaluated:Patient Re-evaluated prior to induction Oxygen Delivery Method: Nasal cannula       

## 2019-09-22 NOTE — Anesthesia Postprocedure Evaluation (Signed)
Anesthesia Post Note  Patient: [name]  Procedure(s) Performed: CATARACT EXTRACTION PHACO AND INTRAOCULAR LENS PLACEMENT (IOC) RIGHT (Right Eye)     Patient location during evaluation: PACU Anesthesia Type: MAC Level of consciousness: awake and alert Pain management: pain level controlled Vital Signs Assessment: post-procedure vital signs reviewed and stable Respiratory status: spontaneous breathing Cardiovascular status: blood pressure returned to baseline Postop Assessment: no apparent nausea or vomiting, adequate PO intake and no headache Anesthetic complications: no    Adele Barthel Sammi Stolarz

## 2019-09-22 NOTE — Transfer of Care (Signed)
Immediate Anesthesia Transfer of Care Note  Patient: [name]  Procedure(s) Performed: CATARACT EXTRACTION PHACO AND INTRAOCULAR LENS PLACEMENT (IOC) RIGHT (Right Eye)  Patient Location: PACU  Anesthesia Type: MAC  Level of Consciousness: awake, alert  and patient cooperative  Airway and Oxygen Therapy: Patient Spontanous Breathing and Patient connected to supplemental oxygen  Post-op Assessment: Post-op Vital signs reviewed, Patient's Cardiovascular Status Stable, Respiratory Function Stable, Patent Airway and No signs of Nausea or vomiting  Post-op Vital Signs: Reviewed and stable  Complications: No apparent anesthesia complications

## 2019-09-22 NOTE — Anesthesia Procedure Notes (Signed)
Procedure Name: MAC Performed by: Abrielle Finck M, CRNA Pre-anesthesia Checklist: Timeout performed, Patient being monitored, Suction available, Emergency Drugs available and Patient identified Patient Re-evaluated:Patient Re-evaluated prior to induction Oxygen Delivery Method: Nasal cannula       

## 2019-09-23 ENCOUNTER — Encounter: Payer: Self-pay | Admitting: *Deleted

## 2019-10-04 NOTE — Anesthesia Postprocedure Evaluation (Signed)
Anesthesia Post Note  Patient: [name]  Procedure(s) Performed: CATARACT EXTRACTION PHACO AND INTRAOCULAR LENS PLACEMENT ([place]) RIGHT (Right Eye)     Anesthesia Post Evaluation  Adele Barthel Ivah Girardot

## 2019-10-11 DIAGNOSIS — T542X1A Toxic effect of corrosive acids and acid-like substances, accidental (unintentional), initial encounter: Secondary | ICD-10-CM | POA: Diagnosis not present

## 2019-10-18 ENCOUNTER — Telehealth: Payer: Self-pay

## 2019-10-18 ENCOUNTER — Ambulatory Visit: Payer: PPO | Attending: Internal Medicine

## 2019-10-18 DIAGNOSIS — Z20822 Contact with and (suspected) exposure to covid-19: Secondary | ICD-10-CM | POA: Diagnosis not present

## 2019-10-18 NOTE — Telephone Encounter (Signed)
Yes to covid test.

## 2019-10-18 NOTE — Telephone Encounter (Signed)
Copied from [place] [phone]. Topic: General - Other >> Oct 18, 2019  8:04 AM Leward Quan A wrote: Reason for CRM: Patient called to inquire of Dr Rosanna Randy should he go and get tested for COVID since he was exposed over a week per patient for a few days he had a fever and some weakness but feel better now and wanted the Dr opinion. Please call Ph# [phone]

## 2019-10-18 NOTE — Telephone Encounter (Signed)
Patient advised and given the number for covid testing.

## 2019-10-19 LAB — NOVEL CORONAVIRUS, NAA: SARS-CoV-2, NAA: NOT DETECTED

## 2019-10-19 LAB — SARS-COV-2, NAA 2 DAY TAT

## 2019-10-20 DIAGNOSIS — T542X1D Toxic effect of corrosive acids and acid-like substances, accidental (unintentional), subsequent encounter: Secondary | ICD-10-CM | POA: Diagnosis not present

## 2019-11-16 DIAGNOSIS — I82419 Acute embolism and thrombosis of unspecified femoral vein: Secondary | ICD-10-CM | POA: Diagnosis not present

## 2019-11-16 DIAGNOSIS — I25119 Atherosclerotic heart disease of native coronary artery with unspecified angina pectoris: Secondary | ICD-10-CM | POA: Diagnosis not present

## 2019-11-16 DIAGNOSIS — I479 Paroxysmal tachycardia, unspecified: Secondary | ICD-10-CM | POA: Diagnosis not present

## 2019-11-18 DIAGNOSIS — H01003 Unspecified blepharitis right eye, unspecified eyelid: Secondary | ICD-10-CM | POA: Diagnosis not present

## 2019-11-20 ENCOUNTER — Other Ambulatory Visit: Payer: Self-pay | Admitting: Family Medicine

## 2019-11-20 NOTE — Telephone Encounter (Signed)
Requested Prescriptions  Pending Prescriptions Disp Refills   omeprazole (PRILOSEC) 20 MG capsule [Pharmacy Med Name: OMEPRAZOLE DR 20 MG CAP] 90 capsule 3    Sig: TAKE 1 CAPSULE BY MOUTH DAILY     Gastroenterology: Proton Pump Inhibitors Passed - 11/20/2019  9:17 AM      Passed - Valid encounter within last 12 months    Recent Outpatient Visits          3 months ago CAFL (chronic airflow limitation) [facility])   [facility] Jerrol Banana., MD   8 months ago Low back pain without sciatica, unspecified back pain laterality, unspecified chronicity   [facility] Jerrol Banana., MD   9 months ago Essential hypertension   [facility] Jerrol Banana., MD   11 months ago Annual physical exam   [facility] Jerrol Banana., MD   1 year ago Sore throat   [facility] Trinna Post, [place]      Future Appointments            In 3 weeks Ralene Bathe, MD [place]   In 6 months [place], Nicki Reaper, [place]

## 2019-12-08 ENCOUNTER — Encounter: Payer: PPO | Admitting: Family Medicine

## 2019-12-08 ENCOUNTER — Ambulatory Visit: Payer: PPO

## 2019-12-08 DIAGNOSIS — J328 Other chronic sinusitis: Secondary | ICD-10-CM | POA: Diagnosis not present

## 2019-12-13 ENCOUNTER — Other Ambulatory Visit: Payer: Self-pay

## 2019-12-13 ENCOUNTER — Ambulatory Visit: Payer: PPO | Admitting: Dermatology

## 2019-12-13 ENCOUNTER — Ambulatory Visit (INDEPENDENT_AMBULATORY_CARE_PROVIDER_SITE_OTHER): Payer: PPO | Admitting: Family Medicine

## 2019-12-13 ENCOUNTER — Ambulatory Visit: Payer: PPO

## 2019-12-13 ENCOUNTER — Ambulatory Visit
Admission: RE | Admit: 2019-12-13 | Discharge: 2019-12-13 | Disposition: A | Discharge status: Home / Self Care | Payer: PPO | Attending: Family Medicine | Admitting: Family Medicine

## 2019-12-13 ENCOUNTER — Encounter: Payer: Self-pay | Admitting: Family Medicine

## 2019-12-13 ENCOUNTER — Ambulatory Visit
Admission: RE | Admit: 2019-12-13 | Discharge: 2019-12-13 | Disposition: A | Discharge status: Home / Self Care | Payer: PPO | Source: Ambulatory Visit | Attending: Family Medicine | Admitting: Family Medicine

## 2019-12-13 ENCOUNTER — Encounter: Payer: Self-pay | Admitting: Dermatology

## 2019-12-13 VITALS — BP 117/74 | HR 73 | Temp 97.3°F | Ht 74.0 in | Wt 164.4 lb

## 2019-12-13 DIAGNOSIS — D692 Other nonthrombocytopenic purpura: Secondary | ICD-10-CM

## 2019-12-13 DIAGNOSIS — D1800 Hemangioma unspecified site: Secondary | ICD-10-CM | POA: Diagnosis not present

## 2019-12-13 DIAGNOSIS — M25552 Pain in left hip: Secondary | ICD-10-CM | POA: Diagnosis not present

## 2019-12-13 DIAGNOSIS — Z1283 Encounter for screening for malignant neoplasm of skin: Secondary | ICD-10-CM | POA: Diagnosis not present

## 2019-12-13 DIAGNOSIS — J449 Chronic obstructive pulmonary disease, unspecified: Secondary | ICD-10-CM

## 2019-12-13 DIAGNOSIS — B4481 Allergic bronchopulmonary aspergillosis: Secondary | ICD-10-CM | POA: Diagnosis not present

## 2019-12-13 DIAGNOSIS — L821 Other seborrheic keratosis: Secondary | ICD-10-CM | POA: Diagnosis not present

## 2019-12-13 DIAGNOSIS — J189 Pneumonia, unspecified organism: Secondary | ICD-10-CM

## 2019-12-13 DIAGNOSIS — D229 Melanocytic nevi, unspecified: Secondary | ICD-10-CM | POA: Diagnosis not present

## 2019-12-13 DIAGNOSIS — M160 Bilateral primary osteoarthritis of hip: Secondary | ICD-10-CM | POA: Diagnosis not present

## 2019-12-13 DIAGNOSIS — Z85828 Personal history of other malignant neoplasm of skin: Secondary | ICD-10-CM | POA: Diagnosis not present

## 2019-12-13 DIAGNOSIS — M25551 Pain in right hip: Secondary | ICD-10-CM

## 2019-12-13 DIAGNOSIS — D489 Neoplasm of uncertain behavior, unspecified: Secondary | ICD-10-CM

## 2019-12-13 DIAGNOSIS — I10 Essential (primary) hypertension: Secondary | ICD-10-CM

## 2019-12-13 DIAGNOSIS — L578 Other skin changes due to chronic exposure to nonionizing radiation: Secondary | ICD-10-CM | POA: Diagnosis not present

## 2019-12-13 DIAGNOSIS — L814 Other melanin hyperpigmentation: Secondary | ICD-10-CM | POA: Diagnosis not present

## 2019-12-13 DIAGNOSIS — L820 Inflamed seborrheic keratosis: Secondary | ICD-10-CM | POA: Diagnosis not present

## 2019-12-13 DIAGNOSIS — L570 Actinic keratosis: Secondary | ICD-10-CM | POA: Diagnosis not present

## 2019-12-13 DIAGNOSIS — D224 Melanocytic nevi of scalp and neck: Secondary | ICD-10-CM | POA: Diagnosis not present

## 2019-12-13 MED ORDER — CELECOXIB 200 MG PO CAPS: 200.0000 mg | ORAL_CAPSULE | Freq: Every day | ORAL | 1 refills | Status: DC

## 2019-12-13 NOTE — Progress Notes (Signed)
Follow-Up Visit   Subjective  [name] is a 79 y.o. male who presents for the following: UBSE. The patient presents for Upper Body Skin Exam (UBSE) for skin cancer screening and mole check.  Patient presents today for a follow up on OV 08/12/19 for AK''s with cryotherapy, patient states that there are still a few small ones that remain. Patient also has 2 other areas of concern, on on the back of his left hand, spot has been there for about 3 months that will not go away and is quite sore and the 2nd is on this left side just above the belt line that has been there for about 4 months that is quite itchy and wont go away.  The following portions of the chart were reviewed this encounter and updated as appropriate:  Tobacco  Allergies  Meds  Problems  Med Hx  Surg Hx  Fam Hx      Review of Systems:  No other skin or systemic complaints except as noted in HPI or Assessment and Plan.  Objective  Well appearing patient in no apparent distress; mood and affect are within normal limits.  All skin waist up examined.  Objective  Left Dorsal Hand x 3, Scalp x 2 (5): Erythematous thin papules/macules with gritty scale.   Objective  Right of midline posterior neck: 0.5 cm crusted papule  Objective  Left lower side above waistline: 0.7 pink papule   Assessment & Plan    AK (actinic keratosis) (5) Left Dorsal Hand x 3, Scalp x 2  Cryotherapy today Prior to procedure, discussed risks of blister formation, small wound, skin dyspigmentation, or rare scar following cryotherapy.    Destruction of lesion - Left Dorsal Hand x 3, Scalp x 2 Complexity: simple   Destruction method: cryotherapy   Informed consent: discussed and consent obtained   Timeout:  patient name, date of birth, surgical site, and procedure verified Lesion destroyed using liquid nitrogen: Yes   Region frozen until ice ball extended beyond lesion: Yes   Outcome: patient tolerated procedure well with no  complications   Post-procedure details: wound care instructions given    Neoplasm of uncertain behavior (2) Right of midline posterior neck  Epidermal / dermal shaving  Lesion diameter (cm):  0.5 Informed consent: discussed and consent obtained   Timeout: patient name, date of birth, surgical site, and procedure verified   Procedure prep:  Patient was prepped and draped in usual sterile fashion Prep type:  Isopropyl alcohol Anesthesia: the lesion was anesthetized in a standard fashion   Anesthetic:  1% lidocaine w/ epinephrine 1-100,000 buffered w/ 8.4% NaHCO3 Instrument used: flexible razor blade   Hemostasis achieved with: pressure, aluminum chloride and electrodesiccation   Outcome: patient tolerated procedure well   Post-procedure details: sterile dressing applied and wound care instructions given   Dressing type: bandage and petrolatum   Additional details:  Post treatment defect 0.9cm  Specimen 1 - Surgical pathology Differential Diagnosis: Irritated nevus vs dysplasia vs ca Check Margins: No 0.5 cm crusted papule  Left lower side above waistline  Epidermal / dermal shaving  Lesion diameter (cm):  0.7 Informed consent: discussed and consent obtained   Timeout: patient name, date of birth, surgical site, and procedure verified   Procedure prep:  Patient was prepped and draped in usual sterile fashion Prep type:  Isopropyl alcohol Anesthesia: the lesion was anesthetized in a standard fashion   Anesthetic:  1% lidocaine w/ epinephrine 1-100,000 buffered w/ 8.4% NaHCO3 Instrument  used: flexible razor blade   Hemostasis achieved with: pressure, aluminum chloride and electrodesiccation   Outcome: patient tolerated procedure well   Post-procedure details: sterile dressing applied and wound care instructions given   Dressing type: bandage and petrolatum   Additional details:  1.1 cm post treatment defect  Specimen 2 - Surgical pathology Differential Diagnosis:  Irritated  nevus vs dysplasia vs ca Check Margins: No 0.7 pink papule  Shave removal today  Skin cancer screening   Lentigines - Scattered tan macules - Discussed due to sun exposure - Benign, observe - Call for any changes  Seborrheic Keratoses - Stuck-on, waxy, tan-brown papules and plaques  - Discussed benign etiology and prognosis. - Observe - Call for any changes  Melanocytic Nevi - Tan-brown and/or pink-flesh-colored symmetric macules and papules - Benign appearing on exam today - Observation - Call clinic for new or changing moles - Recommend daily use of broad spectrum spf 30+ sunscreen to sun-exposed areas.   Hemangiomas - Red papules - Discussed benign nature - Observe - Call for any changes  Actinic Damage - diffuse scaly erythematous macules with underlying dyspigmentation - Recommend daily broad spectrum sunscreen SPF 30+ to sun-exposed areas, reapply every 2 hours as needed.  - Call for new or changing lesions.  Skin cancer screening performed today.  Purpura - Violaceous macules and patches - Benign - Related to age, sun damage and/or use of blood thinners - Observe - Can use OTC arnica containing moisturizer such as Dermend Bruise Formula if desired - Call for worsening or other concerns    History of Basal Cell Carcinoma of the Skin - No evidence of recurrence today - Recommend regular full body skin exams - Recommend daily broad spectrum sunscreen SPF 30+ to sun-exposed areas, reapply every 2 hours as needed.  - Call if any new or changing lesions are noted between office visits   Return in about 8 months (around 08/14/2020) for TBSE.   Documentation: I have reviewed the above documentation for accuracy and completeness, and I agree with the above.  Sarina Ser, MD

## 2019-12-13 NOTE — Progress Notes (Signed)
Trena Platt Cummings,acting as a scribe for Wilhemena Durie, MD.,have documented all relevant documentation on the behalf of Wilhemena Durie, MD,as directed by  Wilhemena Durie, MD while in the presence of Wilhemena Durie, MD.  Established patient visit   Patient: [name]   DOB: [birth year]   79 y.o. Male  MRN: [id number] Visit Date: 12/13/2019  Today's healthcare provider: Wilhemena Durie, MD   Chief Complaint  Patient presents with  . Leg Pain   Subjective    Leg Pain  The incident occurred more than 1 week ago. There was no injury mechanism. The pain is present in the left leg and right leg (groin area). The quality of the pain is described as aching. The pain is at a severity of 6/10. The pain has been constant since onset. Pertinent negatives include no inability to bear weight, numbness or tingling. The symptoms are aggravated by movement. He has tried nothing for the symptoms.   Patient has bilateral groin pain right greater than left with weightbearing worsening it.  This is slowly worsening. Breathing has been fine recently.  He has had both Covid vaccines.       Medications: Outpatient Medications Prior to Visit  Medication Sig  . ADVAIR HFA 230-21 MCG/ACT inhaler Inhale 2 puffs into the lungs 2 (two) times daily.   Marland Kitchen albuterol (PROVENTIL HFA;VENTOLIN HFA) 108 (90 Base) MCG/ACT inhaler Inhale into the lungs every 6 (six) hours as needed for wheezing or shortness of breath.  Marland Kitchen azithromycin (ZITHROMAX) 250 MG tablet Take 250 mg by mouth daily.  . brimonidine (ALPHAGAN) 0.15 % ophthalmic solution Place 1 drop into both eyes 2 (two) times daily.   Marland Kitchen diltiazem (CARDIZEM CD) 180 MG 24 hr capsule Take 1 capsule (180 mg total) by mouth daily.  . dorzolamide (TRUSOPT) 2 % ophthalmic solution Place 1 drop into both eyes 2 (two) times daily.   . finasteride (PROSCAR) 5 MG tablet Take 1 tablet (5 mg total) by mouth daily.  . fluticasone (FLONASE) 50 MCG/ACT nasal  spray Place 2 sprays into both nostrils 2 (two) times daily.   Marland Kitchen gabapentin (NEURONTIN) 400 MG capsule Take 400 mg by mouth daily.   Marland Kitchen latanoprost (XALATAN) 0.005 % ophthalmic solution Place 1 drop into both eyes at bedtime.   . Melatonin 5 MG TABS Take 1 tablet by mouth at bedtime.  . montelukast (SINGULAIR) 10 MG tablet Take 1 tablet (10 mg total) by mouth at bedtime.  Marland Kitchen omeprazole (PRILOSEC) 20 MG capsule TAKE 1 CAPSULE BY MOUTH DAILY  . ondansetron (ZOFRAN) 4 MG tablet Take 1 tablet (4 mg total) by mouth every 8 (eight) hours as needed for nausea or vomiting.  . tamsulosin (FLOMAX) 0.4 MG CAPS capsule Take 1 capsule (0.4 mg total) by mouth daily.  Marland Kitchen triamcinolone ointment (KENALOG) 0.1 % Apply 1 application topically as directed. To cracks on fingers as needed  . XARELTO 10 MG TABS tablet Take 10 mg by mouth daily.   Marland Kitchen tiotropium (SPIRIVA HANDIHALER) 18 MCG inhalation capsule Place 18 mcg into inhaler and inhale daily.    No facility-administered medications prior to visit.    Review of Systems  Constitutional: Negative.   Eyes: Negative.   Respiratory: Negative.   Cardiovascular: Negative.   Gastrointestinal: Negative.   Endocrine: Negative.   Genitourinary: Negative.   Musculoskeletal: Positive for arthralgias.  Allergic/Immunologic: Negative.   Neurological: Negative for tingling and numbness.  Psychiatric/Behavioral: Negative.  Objective    BP 117/74 (BP Location: Left Arm, Patient Position: Sitting, Cuff Size: Normal)   Pulse 73   Temp (!) 97.3 F (36.3 C) (Temporal)   Ht 6\' 2"  (1.88 m)   Wt 164 lb 6.4 oz (74.6 kg)   BMI 21.11 kg/m  BP Readings from Last 3 Encounters:  12/13/19 117/74  09/22/19 135/74  09/01/19 135/78   Wt Readings from Last 3 Encounters:  12/13/19 164 lb 6.4 oz (74.6 kg)  09/22/19 168 lb (76.2 kg)  09/01/19 166 lb 11.2 oz (75.6 kg)      Physical Exam Vitals reviewed.  HENT:     Head: Normocephalic and atraumatic.     Right Ear:  External ear normal.     Left Ear: External ear normal.  Eyes:     General: No scleral icterus. Cardiovascular:     Rate and Rhythm: Normal rate and regular rhythm.     Heart sounds: Normal heart sounds.  Pulmonary:     Effort: Pulmonary effort is normal.     Breath sounds: Normal breath sounds.  Abdominal:     Palpations: Abdomen is soft.  Musculoskeletal:     Comments: Mild positive figure 4 with the right leg/hip  Skin:    General: Skin is warm and dry.  Neurological:     General: No focal deficit present.     Mental Status: He is alert and oriented to person, place, and time.     Comments: Straight leg raise negative.  Psychiatric:        Mood and Affect: Mood normal.        Behavior: Behavior normal.        Thought Content: Thought content normal.        Judgment: Judgment normal.       No results found for any visits on 12/13/19.  Assessment & Plan     1. Hip pain, bilateral Osteoarthritis fits more closely with this pain.  Treat with 2 weeks of Celebrex.  May need orthopedic referral. - Ambulatory referral to Orthopedic Surgery - celecoxib (CELEBREX) 200 MG capsule; Take 1 capsule (200 mg total) by mouth daily.  Dispense: 30 capsule; Refill: 1 - DG HIPS BILAT WITH PELVIS 2V  2. Essential hypertension Controlled  3. Recurrent pneumonia Doing well since Covid.  No Covid diagnoses and no pneumonias recently.  4. Pulmonary aspergillosis, allergic bronchopulmonary type ([place])   5. Chronic obstructive pulmonary disease, unspecified COPD type ([place]) Followed by pulmonary   No follow-ups on file.      I, Wilhemena Durie, MD, have reviewed all documentation for this visit. The documentation on 12/19/19 for the exam, diagnosis, procedures, and orders are all accurate and complete.    Devanie Galanti Cranford Mon, MD  [facility] [phone] (phone) [phone] (fax)  [place]

## 2019-12-13 NOTE — Patient Instructions (Addendum)

## 2019-12-15 ENCOUNTER — Telehealth: Payer: Self-pay

## 2019-12-15 NOTE — Telephone Encounter (Signed)
-----   Message from Jerrol Banana., MD sent at 12/14/2019  6:33 PM EDT ----- Mild hip arthritis.  Follow-up as planned.

## 2019-12-15 NOTE — Progress Notes (Signed)
Complete physical exam  I,[name],acting as a scribe for Wilhemena Durie, MD.,have documented all relevant documentation on the behalf of Wilhemena Durie, MD,as directed by  Wilhemena Durie, MD while in the presence of Wilhemena Durie, MD.   Patient: [name]   DOB: 1941-05-27   79 y.o. Male  MRN: [id number] Visit Date: 12/27/2019  Today's healthcare provider: Wilhemena Durie, MD   Chief Complaint  Patient presents with  . Annual Exam   Subjective    [name] is a 79 y.o. male who presents today for a complete physical exam.  He reports consuming a general diet. Gym/ health club routine includes treadmill. He generally feels well. He reports sleeping fairly well. He does not have additional problems to discuss today.  HPI  Patient had AWV with [facility] 12/22/2019. 12/08/2018 PSA-4.9   Past Medical History:  Diagnosis Date  . AB (asthmatic bronchitis)   . Actinic keratosis   . Allergic rhinitis   . Arthropathy   . Asthma   . Cellulitis   . DVT (deep venous thrombosis) ([place])   . Foreign body in skin   . GERD (gastroesophageal reflux disease)   . Glaucoma   . Hx MRSA infection   . Hx of basal cell carcinoma 02/26/2018   L lat mid back  . Hypertension    Resolved - no meds  . Insomnia   . Osteoarthrosis   . Upper back strain    Past Surgical History:  Procedure Laterality Date  . APPENDECTOMY    . BRONCHOSCOPY    . CATARACT EXTRACTION W/PHACO Left 09/01/2019   Procedure: CATARACT EXTRACTION PHACO AND INTRAOCULAR LENS PLACEMENT (IOC) LEFT 11.72 01:12.2 16.2%;  Surgeon: Leandrew Koyanagi, MD;  Location: [place];  Service: Ophthalmology;  Laterality: Left;  . CATARACT EXTRACTION W/PHACO Right 09/22/2019   Procedure: CATARACT EXTRACTION PHACO AND INTRAOCULAR LENS PLACEMENT ([place]) RIGHT;  Surgeon: Leandrew Koyanagi, MD;  Location: [place];  Service: Ophthalmology;  Laterality: Right;  6.88 103.4 10.9%  . LEFT HEART CATH  AND CORONARY ANGIOGRAPHY N/A 08/19/2016   Procedure: Left Heart Cath and Coronary Angiography;  Surgeon: Dionisio David, MD;  Location: [place] [facility];  Service: Cardiovascular;  Laterality: N/A;  . NASAL SINUS SURGERY  09/27/2014   Social History   Socioeconomic History  . Marital status: Married    Spouse name: Everlene Farrier  . Number of children: 2  . Years of education: College  . Highest education level: Bachelor's degree (e.g., BA, AB, BS)  Occupational History  . Occupation: Retired    Fish farm manager: RETIRED    CommentBiomedical scientist  Tobacco Use  . Smoking status: Former Smoker    Packs/day: 0.50    Years: 3.00    Pack years: 1.50    Types: Cigarettes    Quit date: 06/17/1958    Years since quitting: 61.5  . Smokeless tobacco: Never Used  . Tobacco comment: smoking while in college  Vaping Use  . Vaping Use: Never used  Substance and Sexual Activity  . Alcohol use: Yes    Alcohol/week: 3.0 - 4.0 standard drinks    Types: 3 - 4 Glasses of wine per week  . Drug use: No  . Sexual activity: Yes  Other Topics Concern  . Not on file  Social History Narrative   He is a married father of 2 daughters.   He smoked casually while in college for about 3 years less than  a pack a day.   He drinks 3-4 glass of wine at night. He also has about 2 servings of coffee a day   He is a very active gentleman with Silver [employer]ne[employer] exercise program at least 3-4 days a week.   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   In addition to this standing his exercise, he does lots of gardening and uses a chain saw to cut wood.            Social Determinants of Health   Financial Resource Strain: Low Risk   . Difficulty of Paying Living Expenses: Not hard at all  Food Insecurity: No Food Insecurity  . Worried About Charity fundraiser in the Last Year: Never true  . Ran Out of Food in the Last Year: Never true  Transportation Needs: No Transportation Needs  . Lack of  Transportation (Medical): No  . Lack of Transportation (Non-Medical): No  Physical Activity: Sufficiently Active  . Days of Exercise per Week: 3 days  . Minutes of Exercise per Session: 50 min  Stress: No Stress Concern Present  . Feeling of Stress : Not at all  Social Connections: Moderately Integrated  . Frequency of Communication with Friends and Family: More than three times a week  . Frequency of Social Gatherings with Friends and Family: More than three times a week  . Attends Religious Services: More than 4 times per year  . Active Member of Clubs or Organizations: No  . Attends Archivist Meetings: Never  . Marital Status: Married  Human resources officer Violence: Not At Risk  . Fear of Current or Ex-Partner: No  . Emotionally Abused: No  . Physically Abused: No  . Sexually Abused: No   Family Status  Relation Name Status  . Sister  Deceased  . Mother  Deceased at age 50  . Sister  Deceased  . Father  Deceased at age 21  . Sister  Alive  . Sister  Alive  . Daughter  Deceased       Recently past away 2015   Family History  Problem Relation Age of Onset  . Breast cancer Sister   . Heart failure Mother   . Pancreatic cancer Sister   . Alzheimer's disease Father   . Brain cancer Daughter    Allergies  Allergen Reactions  . Timolol Maleate [Timolol Maleate] Shortness Of Breath  . Atorvastatin Swelling    Swelling in legs, dizziness  . Bactrim [Sulfamethoxazole-Trimethoprim]     Pt says "it takes his skin pigment off  . Budesonide-Formoterol Fumarate Other (See Comments)    SYMBICORT Patient reports dizziness and trembling/shaky feeling.  . Lisinopril Other (See Comments)    cough    Patient Care Team: Jerrol Banana., MD as PCP - General (Family Medicine) Leandrew Koyanagi, MD as Referring Physician (Ophthalmology) Robyne Askew, MD as Referring Physician (Internal Medicine) Dory Horn, MD as Referring Physician (Otolaryngology) Teodoro Spray, MD as Consulting Physician (Cardiology) Garrel Ridgel, [place] as Consulting Physician (Podiatry) Bjorn Loser, MD as Consulting Physician (Urology)   Medications: Outpatient Medications Prior to Visit  Medication Sig  . ADVAIR HFA 230-21 MCG/ACT inhaler Inhale 2 puffs into the lungs 2 (two) times daily.   Marland Kitchen albuterol (PROVENTIL HFA;VENTOLIN HFA) 108 (90 Base) MCG/ACT inhaler Inhale into the lungs every 6 (six) hours as needed for wheezing or shortness of breath.   Marland Kitchen azithromycin (ZITHROMAX) 250 MG tablet  Take 250 mg by mouth daily.   . brimonidine (ALPHAGAN) 0.15 % ophthalmic solution Place 1 drop into both eyes 2 (two) times daily.   . celecoxib (CELEBREX) 200 MG capsule Take 1 capsule (200 mg total) by mouth daily.  Marland Kitchen diltiazem (CARDIZEM CD) 180 MG 24 hr capsule Take 1 capsule (180 mg total) by mouth daily.  . dorzolamide (TRUSOPT) 2 % ophthalmic solution Place 1 drop into both eyes 2 (two) times daily.   . finasteride (PROSCAR) 5 MG tablet Take 1 tablet (5 mg total) by mouth daily.  . fluticasone (FLONASE) 50 MCG/ACT nasal spray Place 2 sprays into both nostrils 2 (two) times daily.   Marland Kitchen gabapentin (NEURONTIN) 400 MG capsule Take 400 mg by mouth daily.   Marland Kitchen latanoprost (XALATAN) 0.005 % ophthalmic solution Place 1 drop into both eyes at bedtime.   . Melatonin 5 MG TABS Take 1 tablet by mouth at bedtime.   . montelukast (SINGULAIR) 10 MG tablet Take 1 tablet (10 mg total) by mouth at bedtime.  Marland Kitchen omeprazole (PRILOSEC) 20 MG capsule TAKE 1 CAPSULE BY MOUTH DAILY  . ondansetron (ZOFRAN) 4 MG tablet Take 1 tablet (4 mg total) by mouth every 8 (eight) hours as needed for nausea or vomiting.  . tamsulosin (FLOMAX) 0.4 MG CAPS capsule Take 1 capsule (0.4 mg total) by mouth daily.  Marland Kitchen triamcinolone ointment (KENALOG) 0.1 % Apply 1 application topically as directed. To cracks on fingers as needed  . XARELTO 10 MG TABS tablet Take 10 mg by mouth daily.   Marland Kitchen tiotropium (SPIRIVA HANDIHALER) 18  MCG inhalation capsule Place 18 mcg into inhaler and inhale daily.    No facility-administered medications prior to visit.    Review of Systems  Constitutional: Negative.   HENT: Negative.   Eyes: Negative.   Respiratory: Negative.   Cardiovascular: Negative.   Gastrointestinal: Negative.   Endocrine: Negative.   Genitourinary: Negative.   Musculoskeletal: Negative.   Skin: Negative.   Allergic/Immunologic: Negative.   Neurological: Negative.   Hematological: Negative.   Psychiatric/Behavioral: Negative.        Objective    BP 120/76 (BP Location: Right Arm, Patient Position: Sitting, Cuff Size: Large)   Pulse 77   Temp (!) 97.5 F (36.4 C) (Other (Comment))   Resp 16   Ht 6\' 2"  (1.88 m)   Wt 164 lb (74.4 kg)   SpO2 97%   BMI 21.06 kg/m  Wt Readings from Last 3 Encounters:  12/27/19 164 lb (74.4 kg)  12/13/19 164 lb 6.4 oz (74.6 kg)  09/22/19 168 lb (76.2 kg)      Physical Exam Vitals reviewed.  Constitutional:      Comments: Cachectic WM NAD.  HENT:     Head: Normocephalic and atraumatic.     Right Ear: Tympanic membrane and external ear normal.     Left Ear: Tympanic membrane and external ear normal.     Nose: Nose normal.  Eyes:     General: No scleral icterus.    Conjunctiva/sclera: Conjunctivae normal.  Cardiovascular:     Rate and Rhythm: Normal rate and regular rhythm.     Heart sounds: Normal heart sounds.  Pulmonary:     Effort: Pulmonary effort is normal.     Breath sounds: Normal breath sounds.  Abdominal:     Palpations: Abdomen is soft.  Genitourinary:    Penis: Normal.      Testes: Normal.     Prostate: Normal.     Rectum: Normal.  Skin:    General: Skin is warm and dry.  Neurological:     General: No focal deficit present.     Mental Status: He is alert and oriented to person, place, and time.  Psychiatric:        Mood and Affect: Mood normal.        Behavior: Behavior normal.        Thought Content: Thought content normal.          Judgment: Judgment normal.       Last depression screening scores PHQ 2/9 Scores 12/22/2019 12/07/2018 12/02/2017  PHQ - 2 Score 0 0 0  PHQ- 9 Score - - -   Last fall risk screening Fall Risk  12/22/2019  Falls in the past year? 0  Number falls in past yr: 0  Injury with Fall? 0   Last Audit-C alcohol use screening Alcohol Use Disorder Test (AUDIT) 12/22/2019  1. How often do you have a drink containing alcohol? 2  2. How many drinks containing alcohol do you have on a typical day when you are drinking? 0  3. How often do you have six or more drinks on one occasion? 0  AUDIT-C Score 2  Alcohol Brief Interventions/Follow-up AUDIT Score <7 follow-up not indicated   A score of 3 or more in women, and 4 or more in men indicates increased risk for alcohol abuse, EXCEPT if all of the points are from question 1   No results found for any visits on 12/27/19.  Assessment & Plan    Routine Health Maintenance and Physical Exam  Exercise Activities and Dietary recommendations Goals   None     Immunization History  Administered Date(s) Administered  . Fluad Quad(high Dose 65+) 02/08/2019  . Influenza Split 03/02/2012, 03/17/2013, 05/07/2014  . Influenza Whole 03/24/2007, 03/15/2009, 02/15/2010  . Influenza, High Dose Seasonal PF 05/04/2015, 03/21/2016, 03/24/2017  . Influenza-Unspecified 02/23/2018  . PFIZER SARS-COV-2 Vaccination 06/28/2019, 07/19/2019  . Pneumococcal Conjugate-13 11/17/2013  . Pneumococcal Polysaccharide-23 11/19/2004, 03/19/2007, 03/17/2009  . Td 05/16/1997, 05/14/2007, 09/11/2010  . Zoster 01/02/2011  . Zoster Recombinat (Shingrix) 02/23/2018, 04/28/2018    Health Maintenance  Topic Date Due  . INFLUENZA VACCINE  01/16/2020  . TETANUS/TDAP  09/10/2020  . COVID-19 Vaccine  Completed  . PNA vac Low Risk Adult  Completed    Discussed health benefits of physical activity, and encouraged him to engage in regular exercise appropriate for his age and  condition.  1. Annual physical exam  - CBC with Differential/Platelet - Comprehensive metabolic panel - Lipid Panel With LDL/HDL Ratio - PSA - TSH  2. Essential hypertension  - CBC with Differential/Platelet - Comprehensive metabolic panel  3. Hyperlipidemia, unspecified hyperlipidemia type  - Lipid Panel With LDL/HDL Ratio  4. Benign prostatic hyperplasia without urinary obstruction  - PSA  5. Congestive heart failure, unspecified HF chronicity, unspecified heart failure type (HCC)  - TSH  6. Encounter for screening fecal occult blood testing  - IFOBT POC (occult bld, rslt in office)-Negative   No follow-ups on file.     I, Wilhemena Durie, MD, have reviewed all documentation for this visit. The documentation on 12/29/19 for the exam, diagnosis, procedures, and orders are all accurate and complete.    Alyiah Ulloa Cranford Mon, MD  [facility] [phone] (phone) [phone] (fax)  [place]

## 2019-12-15 NOTE — Telephone Encounter (Signed)
Patient advised of biopsy results.

## 2019-12-15 NOTE — Telephone Encounter (Signed)
-----   Message from Ralene Bathe, MD sent at 12/14/2019  6:44 PM EDT ----- 1. Skin , right of midline posterior neck MELANOCYTIC NEVUS, INTRADERMAL TYPE 2. Skin , left lower side above waistline SEBORRHEIC KERATOSIS, IRRITATED, EXCORIATED  1- benign mole 2- benign keratosis

## 2019-12-15 NOTE — Telephone Encounter (Signed)
Patient advised of lab results

## 2019-12-15 NOTE — Telephone Encounter (Signed)
Left pt msg to call for bx results/sh 

## 2019-12-21 NOTE — Progress Notes (Signed)
Subjective:   [name] is a 79 y.o. male who presents for Medicare Annual/Subsequent preventive examination.  I connected with Liam Rogers today by telephone and verified that I am speaking with the correct person using two identifiers. Location patient: home Location provider: work Persons participating in the virtual visit: patient, provider.   I discussed the limitations, risks, security and privacy concerns of performing an evaluation and management service by telephone and the availability of in person appointments. I also discussed with the patient that there may be a patient responsible charge related to this service. The patient expressed understanding and verbally consented to this telephonic visit.    Interactive audio and video telecommunications were attempted between this provider and patient, however failed, due to patient having technical difficulties OR patient did not have access to video capability.  We continued and completed visit with audio only.  Review of Systems    N/A  Cardiac Risk Factors include: advanced age (>85men, >31 women);male gender     Objective:    There were no vitals filed for this visit. There is no height or weight on file to calculate BMI.  Advanced Directives 12/22/2019 09/22/2019 09/01/2019 12/07/2018 04/29/2018 12/02/2017 08/13/2017  Does Patient Have a Medical Advance Directive? Yes Yes Yes Yes No Yes Yes  Type of Paramedic of [place];Living will [place];Living will [facility];Living will [place];Living will - [place];Living will [place];Living will  Does patient want to make changes to medical advance directive? - - No - Patient declined - - - No - Patient declined  Copy of [place] in Chart? Yes - validated most recent copy scanned in chart (See row information) No - copy requested Yes - validated  most recent copy scanned in chart (See row information) Yes - validated most recent copy scanned in chart (See row information) - Yes No - copy requested  Would patient like information on creating a medical advance directive? - - - - No - Patient declined - -    Current Medications (verified) Outpatient Encounter Medications as of 12/22/2019  Medication Sig  . ADVAIR HFA 230-21 MCG/ACT inhaler Inhale 2 puffs into the lungs 2 (two) times daily.   Marland Kitchen albuterol (PROVENTIL HFA;VENTOLIN HFA) 108 (90 Base) MCG/ACT inhaler Inhale into the lungs every 6 (six) hours as needed for wheezing or shortness of breath.   Marland Kitchen azithromycin (ZITHROMAX) 250 MG tablet Take 250 mg by mouth daily.   . brimonidine (ALPHAGAN) 0.15 % ophthalmic solution Place 1 drop into both eyes 2 (two) times daily.   . celecoxib (CELEBREX) 200 MG capsule Take 1 capsule (200 mg total) by mouth daily.  Marland Kitchen diltiazem (CARDIZEM CD) 180 MG 24 hr capsule Take 1 capsule (180 mg total) by mouth daily.  . dorzolamide (TRUSOPT) 2 % ophthalmic solution Place 1 drop into both eyes 2 (two) times daily.   . finasteride (PROSCAR) 5 MG tablet Take 1 tablet (5 mg total) by mouth daily.  . fluticasone (FLONASE) 50 MCG/ACT nasal spray Place 2 sprays into both nostrils 2 (two) times daily.   Marland Kitchen gabapentin (NEURONTIN) 400 MG capsule Take 400 mg by mouth daily.   Marland Kitchen latanoprost (XALATAN) 0.005 % ophthalmic solution Place 1 drop into both eyes at bedtime.   . Melatonin 5 MG TABS Take 1 tablet by mouth at bedtime.   . montelukast (SINGULAIR) 10 MG tablet Take 1 tablet (10 mg  total) by mouth at bedtime.  Marland Kitchen omeprazole (PRILOSEC) 20 MG capsule TAKE 1 CAPSULE BY MOUTH DAILY  . ondansetron (ZOFRAN) 4 MG tablet Take 1 tablet (4 mg total) by mouth every 8 (eight) hours as needed for nausea or vomiting.  . tamsulosin (FLOMAX) 0.4 MG CAPS capsule Take 1 capsule (0.4 mg total) by mouth daily.  Marland Kitchen tiotropium (SPIRIVA HANDIHALER) 18 MCG inhalation capsule Place 18 mcg into  inhaler and inhale daily.   Marland Kitchen triamcinolone ointment (KENALOG) 0.1 % Apply 1 application topically as directed. To cracks on fingers as needed  . XARELTO 10 MG TABS tablet Take 10 mg by mouth daily.    No facility-administered encounter medications on file as of 12/22/2019.    Allergies (verified) Timolol maleate [timolol maleate], Atorvastatin, Bactrim [sulfamethoxazole-trimethoprim], Budesonide-formoterol fumarate, and Lisinopril   History: Past Medical History:  Diagnosis Date  . AB (asthmatic bronchitis)   . Actinic keratosis   . Allergic rhinitis   . Arthropathy   . Asthma   . Cellulitis   . DVT (deep venous thrombosis) ([place])   . Foreign body in skin   . GERD (gastroesophageal reflux disease)   . Glaucoma   . Hx MRSA infection   . Hx of basal cell carcinoma 02/26/2018   L lat mid back  . Hypertension    Resolved - no meds  . Insomnia   . Osteoarthrosis   . Upper back strain    Past Surgical History:  Procedure Laterality Date  . APPENDECTOMY    . BRONCHOSCOPY    . CATARACT EXTRACTION W/PHACO Left 09/01/2019   Procedure: CATARACT EXTRACTION PHACO AND INTRAOCULAR LENS PLACEMENT ([facility]) LEFT 11.72 01:12.2 16.2%;  Surgeon: Leandrew Koyanagi, MD;  Location: [facility];  Service: Ophthalmology;  Laterality: Left;  . CATARACT EXTRACTION W/PHACO Right 09/22/2019   Procedure: CATARACT EXTRACTION PHACO AND INTRAOCULAR LENS PLACEMENT ([place]) RIGHT;  Surgeon: Leandrew Koyanagi, MD;  Location: [facility];  Service: Ophthalmology;  Laterality: Right;  6.88 103.4 10.9%  . LEFT HEART CATH AND CORONARY ANGIOGRAPHY N/A 08/19/2016   Procedure: Left Heart Cath and Coronary Angiography;  Surgeon: Dionisio David, MD;  Location: [facility];  Service: Cardiovascular;  Laterality: N/A;  . NASAL SINUS SURGERY  09/27/2014   Family History  Problem Relation Age of Onset  . Breast cancer Sister   . Heart failure Mother   . Pancreatic cancer Sister   . Alzheimer's  disease Father   . Brain cancer Daughter    Social History   Socioeconomic History  . Marital status: Married    Spouse name: Everlene Farrier  . Number of children: 2  . Years of education: College  . Highest education level: Bachelor's degree (e.g., BA, AB, BS)  Occupational History  . Occupation: Retired    Fish farm manager: RETIRED    CommentBiomedical scientist  Tobacco Use  . Smoking status: Former Smoker    Packs/day: 0.50    Years: 3.00    Pack years: 1.50    Types: Cigarettes    Quit date: 06/17/1958    Years since quitting: 61.5  . Smokeless tobacco: Never Used  . Tobacco comment: smoking while in college  Vaping Use  . Vaping Use: Never used  Substance and Sexual Activity  . Alcohol use: Yes    Alcohol/week: 3.0 - 4.0 standard drinks    Types: 3 - 4 Glasses of wine per week  . Drug use: No  . Sexual activity: Yes  Other Topics Concern  . Not  on file  Social History Narrative   He is a married father of 2 daughters.   He smoked casually while in college for about 3 years less than a pack a day.   He drinks 3-4 glass of wine at night. He also has about 2 servings of coffee a day   He is a very active gentleman with Silver Sneakers exercise program at least 3-4 days a week.   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   In addition to this standing his exercise, he does lots of gardening and uses a chain saw to cut wood.            Social Determinants of Health   Financial Resource Strain: Low Risk   . Difficulty of Paying Living Expenses: Not hard at all  Food Insecurity: No Food Insecurity  . Worried About Charity fundraiser in the Last Year: Never true  . Ran Out of Food in the Last Year: Never true  Transportation Needs: No Transportation Needs  . Lack of Transportation (Medical): No  . Lack of Transportation (Non-Medical): No  Physical Activity: Sufficiently Active  . Days of Exercise per Week: 3 days  . Minutes of Exercise per Session: 50 min    Stress: No Stress Concern Present  . Feeling of Stress : Not at all  Social Connections: Moderately Integrated  . Frequency of Communication with Friends and Family: More than three times a week  . Frequency of Social Gatherings with Friends and Family: More than three times a week  . Attends Religious Services: More than 4 times per year  . Active Member of Clubs or Organizations: No  . Attends Archivist Meetings: Never  . Marital Status: Married    Tobacco Counseling Counseling given: Not Answered Comment: smoking while in college   Clinical Intake:  Pre-visit preparation completed: Yes  Pain : No/denies pain     Nutritional Risks: None Diabetes: No  How often do you need to have someone help you when you read instructions, pamphlets, or other written materials from your doctor or pharmacy?: 1 - Never  Diabetic? No  Interpreter Needed?: No  Information entered by :: [facility], LPN   Activities of Daily Living In your present state of health, do you have any difficulty performing the following activities: 12/22/2019 09/22/2019  Hearing? N N  Vision? N N  Difficulty concentrating or making decisions? N N  Walking or climbing stairs? N N  Dressing or bathing? N N  Doing errands, shopping? N -  Preparing Food and eating ? N -  Using the Toilet? N -  In the past six months, have you accidently leaked urine? N -  Do you have problems with loss of bowel control? N -  Managing your Medications? N -  Managing your Finances? N -  Housekeeping or managing your Housekeeping? N -  Some recent data might be hidden    Patient Care Team: Jerrol Banana., MD as PCP - General (Family Medicine) Leandrew Koyanagi, MD as Referring Physician (Ophthalmology) Robyne Askew, MD as Referring Physician (Internal Medicine) Dory Horn, MD as Referring Physician (Otolaryngology) Teodoro Spray, MD as Consulting Physician (Cardiology) Garrel Ridgel, [place] as  Consulting Physician (Podiatry) Bjorn Loser, MD as Consulting Physician (Urology)  Indicate any recent Medical Services you may have received from other than [facility] providers in the past year (date may be approximate).     Assessment:  This is a routine wellness examination for [place].  Hearing/Vision screen No exam data present  Dietary issues and exercise activities discussed: Current Exercise Habits: Structured exercise class, Type of exercise: treadmill;Other - see comments (and eliptical), Time (Minutes): 50, Frequency (Times/Week): 3, Weekly Exercise (Minutes/Week): 150, Intensity: Mild, Exercise limited by: None identified  Goals   None    Depression Screen PHQ 2/9 Scores 12/22/2019 12/07/2018 12/02/2017 01/14/2017 12/31/2016 12/31/2016 01/02/2016  PHQ - 2 Score 0 0 0 0 0 0 0  PHQ- 9 Score - - - - 0 - -    Fall Risk Fall Risk  12/22/2019 12/07/2018 12/02/2017 01/14/2017 12/31/2016  Falls in the past year? 0 0 No No No  Number falls in past yr: 0 - - - -  Injury with Fall? 0 - - - -    Any stairs in or around the home? Yes  If so, are there any without handrails? No  Home free of loose throw rugs in walkways, pet beds, electrical cords, etc? Yes  Adequate lighting in your home to reduce risk of falls? Yes   ASSISTIVE DEVICES UTILIZED TO PREVENT FALLS:  Life alert? No  Use of a cane, walker or w/c? No  Grab bars in the bathroom? Yes  Shower chair or bench in shower? Yes  Elevated toilet seat or a handicapped toilet? Yes    Cognitive Function:     6CIT Screen 12/22/2019 12/07/2018 12/31/2016  What Year? 0 points 0 points 0 points  What month? 0 points 0 points 0 points  What time? 0 points 0 points 0 points  Count back from 20 0 points 2 points 0 points  Months in reverse 0 points 0 points 0 points  Repeat phrase 0 points 0 points 4 points  Total Score 0 2 4    Immunizations Immunization History  Administered Date(s) Administered  . Fluad Quad(high Dose 65+)  02/08/2019  . Influenza Split 03/02/2012, 03/17/2013, 05/07/2014  . Influenza Whole 03/24/2007, 03/15/2009, 02/15/2010  . Influenza, High Dose Seasonal PF 05/04/2015, 03/21/2016, 03/24/2017  . Influenza-Unspecified 02/23/2018  . PFIZER SARS-COV-2 Vaccination 06/28/2019, 07/19/2019  . Pneumococcal Conjugate-13 11/17/2013  . Pneumococcal Polysaccharide-23 11/19/2004, 03/19/2007, 03/17/2009  . Td 05/16/1997, 05/14/2007, 09/11/2010  . Zoster 01/02/2011  . Zoster Recombinat (Shingrix) 02/23/2018, 04/28/2018    TDAP status: Up to date Flu Vaccine status: Up to date Pneumococcal vaccine status: Up to date Covid-19 vaccine status: Completed vaccines  Qualifies for Shingles Vaccine? Yes   Zostavax completed No   Shingrix Completed?: Yes  Screening Tests Health Maintenance  Topic Date Due  . INFLUENZA VACCINE  01/16/2020  . TETANUS/TDAP  09/10/2020  . COVID-19 Vaccine  Completed  . PNA vac Low Risk Adult  Completed    Health Maintenance  There are no preventive care reminders to display for this patient.  Colorectal cancer screening: No longer required.   Lung Cancer Screening: (Low Dose CT Chest recommended if Age 81-80 years, 30 pack-year currently smoking OR have quit w/in 15years.) does not qualify.   Additional Screening:  Vision Screening: Recommended annual ophthalmology exams for early detection of glaucoma and other disorders of the eye. Is the patient up to date with their annual eye exam?  Yes  Who is the provider or what is the name of the office in which the patient attends annual eye exams? Dr Wallace Going @ [place] If pt is not established with a provider, would they like to be referred to a provider to establish care? No .  Dental Screening: Recommended annual dental exams for proper oral hygiene  Community Resource Referral / Chronic Care Management: CRR required this visit?  No   CCM required this visit?  No      Plan:     I have personally reviewed and  noted the following in the patient's chart:   . Medical and social history . Use of alcohol, tobacco or illicit drugs  . Current medications and supplements . Functional ability and status . Nutritional status . Physical activity . Advanced directives . List of other physicians . Hospitalizations, surgeries, and ER visits in previous 12 months . Vitals . Screenings to include cognitive, depression, and falls . Referrals and appointments  In addition, I have reviewed and discussed with patient certain preventive protocols, quality metrics, and best practice recommendations. A written personalized care plan for preventive services as well as general preventive health recommendations were provided to patient.     Levy Cedano [place], [place]   08/20/5730   Nurse Notes: None.

## 2019-12-22 ENCOUNTER — Other Ambulatory Visit: Payer: Self-pay

## 2019-12-22 ENCOUNTER — Ambulatory Visit (INDEPENDENT_AMBULATORY_CARE_PROVIDER_SITE_OTHER): Payer: PPO

## 2019-12-22 DIAGNOSIS — Z0000 Encounter for general adult medical examination without abnormal findings: Secondary | ICD-10-CM | POA: Diagnosis not present

## 2019-12-22 NOTE — Patient Instructions (Signed)
[name] , Thank you for taking time to come for your Medicare Wellness Visit. I appreciate your ongoing commitment to your health goals. Please review the following plan we discussed and let me know if I can assist you in the future.   Screening recommendations/referrals: Colonoscopy: No longer required.  Recommended yearly ophthalmology/optometry visit for glaucoma screening and checkup Recommended yearly dental visit for hygiene and checkup  Vaccinations: Influenza vaccine: Up to date Pneumococcal vaccine: Completed series Tdap vaccine: Up to date, due 08/2020 Shingles vaccine: Completed series    Advanced directives: Currently on file.  Conditions/risks identified: None.  Next appointment: 12/27/19 @ 11:00 AM with Dr Rosanna Randy   Preventive Care 65 Years and Older, Male Preventive care refers to lifestyle choices and visits with your health care provider that can promote health and wellness. What does preventive care include?  A yearly physical exam. This is also called an annual well check.  Dental exams once or twice a year.  Routine eye exams. Ask your health care provider how often you should have your eyes checked.  Personal lifestyle choices, including:  Daily care of your teeth and gums.  Regular physical activity.  Eating a healthy diet.  Avoiding tobacco and drug use.  Limiting alcohol use.  Practicing safe sex.  Taking low doses of aspirin every day.  Taking vitamin and mineral supplements as recommended by your health care provider. What happens during an annual well check? The services and screenings done by your health care provider during your annual well check will depend on your age, overall health, lifestyle risk factors, and family history of disease. Counseling  Your health care provider may ask you questions about your:  Alcohol use.  Tobacco use.  Drug use.  Emotional well-being.  Home and relationship well-being.  Sexual  activity.  Eating habits.  History of falls.  Memory and ability to understand (cognition).  Work and work Statistician. Screening  You may have the following tests or measurements:  Height, weight, and BMI.  Blood pressure.  Lipid and cholesterol levels. These may be checked every 5 years, or more frequently if you are over 76 years old.  Skin check.  Lung cancer screening. You may have this screening every year starting at age 27 if you have a 30-pack-year history of smoking and currently smoke or have quit within the past 15 years.  Fecal occult blood test (FOBT) of the stool. You may have this test every year starting at age 12.  Flexible sigmoidoscopy or colonoscopy. You may have a sigmoidoscopy every 5 years or a colonoscopy every 10 years starting at age 16.  Prostate cancer screening. Recommendations will vary depending on your family history and other risks.  Hepatitis C blood test.  Hepatitis B blood test.  Sexually transmitted disease (STD) testing.  Diabetes screening. This is done by checking your blood sugar (glucose) after you have not eaten for a while (fasting). You may have this done every 1-3 years.  Abdominal aortic aneurysm (AAA) screening. You may need this if you are a current or former smoker.  Osteoporosis. You may be screened starting at age 47 if you are at high risk. Talk with your health care provider about your test results, treatment options, and if necessary, the need for more tests. Vaccines  Your health care provider may recommend certain vaccines, such as:  Influenza vaccine. This is recommended every year.  Tetanus, diphtheria, and acellular pertussis (Tdap, Td) vaccine. You may need a Td booster every  10 years.  Zoster vaccine. You may need this after age 76.  Pneumococcal 13-valent conjugate (PCV13) vaccine. One dose is recommended after age 82.  Pneumococcal polysaccharide (PPSV23) vaccine. One dose is recommended after age  87. Talk to your health care provider about which screenings and vaccines you need and how often you need them. This information is not intended to replace advice given to you by your health care provider. Make sure you discuss any questions you have with your health care provider. Document Released: 06/30/2015 Document Revised: 02/21/2016 Document Reviewed: 04/04/2015 Elsevier Interactive Patient Education  2017 [place] Prevention in the Home Falls can cause injuries. They can happen to people of all ages. There are many things you can do to make your home safe and to help prevent falls. What can I do on the outside of my home?  Regularly fix the edges of walkways and driveways and fix any cracks.  Remove anything that might make you trip as you walk through a door, such as a raised step or threshold.  Trim any bushes or trees on the path to your home.  Use bright outdoor lighting.  Clear any walking paths of anything that might make someone trip, such as rocks or tools.  Regularly check to see if handrails are loose or broken. Make sure that both sides of any steps have handrails.  Any raised decks and porches should have guardrails on the edges.  Have any leaves, snow, or ice cleared regularly.  Use sand or salt on walking paths during winter.  Clean up any spills in your garage right away. This includes oil or grease spills. What can I do in the bathroom?  Use night lights.  Install grab bars by the toilet and in the tub and shower. Do not use towel bars as grab bars.  Use non-skid mats or decals in the tub or shower.  If you need to sit down in the shower, use a plastic, non-slip stool.  Keep the floor dry. Clean up any water that spills on the floor as soon as it happens.  Remove soap buildup in the tub or shower regularly.  Attach bath mats securely with double-sided non-slip rug tape.  Do not have throw rugs and other things on the floor that can make  you trip. What can I do in the bedroom?  Use night lights.  Make sure that you have a light by your bed that is easy to reach.  Do not use any sheets or blankets that are too big for your bed. They should not hang down onto the floor.  Have a firm chair that has side arms. You can use this for support while you get dressed.  Do not have throw rugs and other things on the floor that can make you trip. What can I do in the kitchen?  Clean up any spills right away.  Avoid walking on wet floors.  Keep items that you use a lot in easy-to-reach places.  If you need to reach something above you, use a strong step stool that has a grab bar.  Keep electrical cords out of the way.  Do not use floor polish or wax that makes floors slippery. If you must use wax, use non-skid floor wax.  Do not have throw rugs and other things on the floor that can make you trip. What can I do with my stairs?  Do not leave any items on the stairs.  Make sure  that there are handrails on both sides of the stairs and use them. Fix handrails that are broken or loose. Make sure that handrails are as long as the stairways.  Check any carpeting to make sure that it is firmly attached to the stairs. Fix any carpet that is loose or worn.  Avoid having throw rugs at the top or bottom of the stairs. If you do have throw rugs, attach them to the floor with carpet tape.  Make sure that you have a light switch at the top of the stairs and the bottom of the stairs. If you do not have them, ask someone to add them for you. What else can I do to help prevent falls?  Wear shoes that:  Do not have high heels.  Have rubber bottoms.  Are comfortable and fit you well.  Are closed at the toe. Do not wear sandals.  If you use a stepladder:  Make sure that it is fully opened. Do not climb a closed stepladder.  Make sure that both sides of the stepladder are locked into place.  Ask someone to hold it for you, if  possible.  Clearly mark and make sure that you can see:  Any grab bars or handrails.  First and last steps.  Where the edge of each step is.  Use tools that help you move around (mobility aids) if they are needed. These include:  Canes.  Walkers.  Scooters.  Crutches.  Turn on the lights when you go into a dark area. Replace any light bulbs as soon as they burn out.  Set up your furniture so you have a clear path. Avoid moving your furniture around.  If any of your floors are uneven, fix them.  If there are any pets around you, be aware of where they are.  Review your medicines with your doctor. Some medicines can make you feel dizzy. This can increase your chance of falling. Ask your doctor what other things that you can do to help prevent falls. This information is not intended to replace advice given to you by your health care provider. Make sure you discuss any questions you have with your health care provider. Document Released: 03/30/2009 Document Revised: 11/09/2015 Document Reviewed: 07/08/2014 Elsevier Interactive Patient Education  2017 [employer].

## 2019-12-26 ENCOUNTER — Encounter: Payer: Self-pay | Admitting: Dermatology

## 2019-12-27 ENCOUNTER — Other Ambulatory Visit: Payer: Self-pay

## 2019-12-27 ENCOUNTER — Encounter: Payer: Self-pay | Admitting: Family Medicine

## 2019-12-27 ENCOUNTER — Ambulatory Visit (INDEPENDENT_AMBULATORY_CARE_PROVIDER_SITE_OTHER): Payer: PPO | Admitting: Family Medicine

## 2019-12-27 VITALS — BP 120/76 | HR 77 | Temp 97.5°F | Resp 16 | Ht 74.0 in | Wt 164.0 lb

## 2019-12-27 DIAGNOSIS — I10 Essential (primary) hypertension: Secondary | ICD-10-CM

## 2019-12-27 DIAGNOSIS — E785 Hyperlipidemia, unspecified: Secondary | ICD-10-CM | POA: Diagnosis not present

## 2019-12-27 DIAGNOSIS — I509 Heart failure, unspecified: Secondary | ICD-10-CM | POA: Diagnosis not present

## 2019-12-27 DIAGNOSIS — N400 Enlarged prostate without lower urinary tract symptoms: Secondary | ICD-10-CM | POA: Diagnosis not present

## 2019-12-27 DIAGNOSIS — Z0000 Encounter for general adult medical examination without abnormal findings: Secondary | ICD-10-CM | POA: Diagnosis not present

## 2019-12-27 DIAGNOSIS — Z1211 Encounter for screening for malignant neoplasm of colon: Secondary | ICD-10-CM | POA: Diagnosis not present

## 2019-12-27 LAB — IFOBT (OCCULT BLOOD): IFOBT: NEGATIVE

## 2019-12-28 DIAGNOSIS — I10 Essential (primary) hypertension: Secondary | ICD-10-CM | POA: Diagnosis not present

## 2019-12-28 DIAGNOSIS — I509 Heart failure, unspecified: Secondary | ICD-10-CM | POA: Diagnosis not present

## 2019-12-28 DIAGNOSIS — E785 Hyperlipidemia, unspecified: Secondary | ICD-10-CM | POA: Diagnosis not present

## 2019-12-28 DIAGNOSIS — N400 Enlarged prostate without lower urinary tract symptoms: Secondary | ICD-10-CM | POA: Diagnosis not present

## 2019-12-28 DIAGNOSIS — Z0000 Encounter for general adult medical examination without abnormal findings: Secondary | ICD-10-CM | POA: Diagnosis not present

## 2019-12-29 DIAGNOSIS — M5416 Radiculopathy, lumbar region: Secondary | ICD-10-CM | POA: Diagnosis not present

## 2019-12-29 DIAGNOSIS — M25551 Pain in right hip: Secondary | ICD-10-CM | POA: Diagnosis not present

## 2019-12-29 DIAGNOSIS — M25552 Pain in left hip: Secondary | ICD-10-CM | POA: Diagnosis not present

## 2019-12-29 LAB — LIPID PANEL WITH LDL/HDL RATIO
Cholesterol, Total: 138 mg/dL (ref 100–199)
HDL: 52 mg/dL (ref >39)
LDL Chol Calc (NIH): 76 mg/dL (ref 0–99)
LDL/HDL Ratio: 1.5 ratio (ref 0.0–3.6)
Triglycerides: 45 mg/dL (ref 0–149)
VLDL Cholesterol Cal: 10 mg/dL (ref 5–40)

## 2019-12-29 LAB — CBC WITH DIFFERENTIAL/PLATELET
Basophils Absolute: 0.1 10*3/uL (ref 0.0–0.2)
Basos: 1 %
EOS (ABSOLUTE): 0.2 10*3/uL (ref 0.0–0.4)
Eos: 3 %
Hematocrit: 44.3 % (ref 37.5–51.0)
Hemoglobin: 15.1 g/dL (ref 13.0–17.7)
Immature Grans (Abs): 0 10*3/uL (ref 0.0–0.1)
Immature Granulocytes: 0 %
Lymphocytes Absolute: 1.6 10*3/uL (ref 0.7–3.1)
Lymphs: 27 %
MCH: 31.9 pg (ref 26.6–33.0)
MCHC: 34.1 g/dL (ref 31.5–35.7)
MCV: 94 fL (ref 79–97)
Monocytes Absolute: 0.7 10*3/uL (ref 0.1–0.9)
Monocytes: 12 %
Neutrophils Absolute: 3.5 10*3/uL (ref 1.4–7.0)
Neutrophils: 57 %
Platelets: 194 10*3/uL (ref 150–450)
RBC: 4.74 x10E6/uL (ref 4.14–5.80)
RDW: 12.5 % (ref 11.6–15.4)
WBC: 6.1 10*3/uL (ref 3.4–10.8)

## 2019-12-29 LAB — COMPREHENSIVE METABOLIC PANEL
ALT: 22 IU/L (ref 0–44)
AST: 23 IU/L (ref 0–40)
Albumin/Globulin Ratio: 1.5 (ref 1.2–2.2)
Albumin: 3.7 g/dL (ref 3.6–4.6)
Alkaline Phosphatase: 164 IU/L — ABNORMAL HIGH (ref 48–121)
BUN/Creatinine Ratio: 9 — ABNORMAL LOW (ref 10–24)
BUN: 10 mg/dL (ref 8–27)
Bilirubin Total: 0.7 mg/dL (ref 0.0–1.2)
CO2: 20 mmol/L (ref 20–29)
Calcium: 8.7 mg/dL (ref 8.6–10.2)
Chloride: 103 mmol/L (ref 96–106)
Creatinine, Ser: 1.08 mg/dL (ref 0.76–1.27)
GFR calc Af Amer: 74 mL/min/{1.73_m2} (ref >59)
GFR calc non Af Amer: 64 mL/min/{1.73_m2} (ref >59)
Globulin, Total: 2.5 g/dL (ref 1.5–4.5)
Glucose: 93 mg/dL (ref 65–99)
Potassium: 4.1 mmol/L (ref 3.5–5.2)
Sodium: 134 mmol/L (ref 134–144)
Total Protein: 6.2 g/dL (ref 6.0–8.5)

## 2019-12-29 LAB — PSA: Prostate Specific Ag, Serum: 3 ng/mL (ref 0.0–4.0)

## 2019-12-29 LAB — TSH: TSH: 3.1 u[IU]/mL (ref 0.450–4.500)

## 2019-12-30 ENCOUNTER — Telehealth: Payer: Self-pay

## 2019-12-30 NOTE — Telephone Encounter (Signed)
-----   Message from Jerrol Banana., MD sent at 12/29/2019  4:06 PM EDT ----- Labs are stable and in good range.

## 2019-12-30 NOTE — Telephone Encounter (Signed)
Patient returned call and was read lab note by Dr Rosanna Randy sent 12/29/19. He verbalized understanding.

## 2019-12-30 NOTE — Telephone Encounter (Signed)
LMTCB, [facility] Triage Nurse may give patient results  

## 2020-01-03 DIAGNOSIS — J440 Chronic obstructive pulmonary disease with acute lower respiratory infection: Secondary | ICD-10-CM | POA: Diagnosis not present

## 2020-01-03 DIAGNOSIS — J8489 Other specified interstitial pulmonary diseases: Secondary | ICD-10-CM | POA: Diagnosis not present

## 2020-01-03 DIAGNOSIS — G4700 Insomnia, unspecified: Secondary | ICD-10-CM | POA: Diagnosis not present

## 2020-01-03 DIAGNOSIS — J329 Chronic sinusitis, unspecified: Secondary | ICD-10-CM | POA: Diagnosis not present

## 2020-01-03 DIAGNOSIS — Z7951 Long term (current) use of inhaled steroids: Secondary | ICD-10-CM | POA: Diagnosis not present

## 2020-01-03 DIAGNOSIS — M1990 Unspecified osteoarthritis, unspecified site: Secondary | ICD-10-CM | POA: Diagnosis not present

## 2020-01-03 DIAGNOSIS — J3489 Other specified disorders of nose and nasal sinuses: Secondary | ICD-10-CM | POA: Diagnosis not present

## 2020-01-03 DIAGNOSIS — R918 Other nonspecific abnormal finding of lung field: Secondary | ICD-10-CM | POA: Diagnosis not present

## 2020-01-03 DIAGNOSIS — J90 Pleural effusion, not elsewhere classified: Secondary | ICD-10-CM | POA: Diagnosis not present

## 2020-01-03 DIAGNOSIS — Z792 Long term (current) use of antibiotics: Secondary | ICD-10-CM | POA: Diagnosis not present

## 2020-01-03 DIAGNOSIS — K219 Gastro-esophageal reflux disease without esophagitis: Secondary | ICD-10-CM | POA: Diagnosis not present

## 2020-01-03 DIAGNOSIS — R05 Cough: Secondary | ICD-10-CM | POA: Diagnosis not present

## 2020-01-03 DIAGNOSIS — J9811 Atelectasis: Secondary | ICD-10-CM | POA: Diagnosis not present

## 2020-01-03 DIAGNOSIS — Z87891 Personal history of nicotine dependence: Secondary | ICD-10-CM | POA: Diagnosis not present

## 2020-01-03 DIAGNOSIS — Z7952 Long term (current) use of systemic steroids: Secondary | ICD-10-CM | POA: Diagnosis not present

## 2020-01-03 DIAGNOSIS — M25552 Pain in left hip: Secondary | ICD-10-CM | POA: Diagnosis not present

## 2020-01-04 DIAGNOSIS — R05 Cough: Secondary | ICD-10-CM | POA: Diagnosis not present

## 2020-01-06 ENCOUNTER — Encounter: Payer: Self-pay | Admitting: Family Medicine

## 2020-01-17 DIAGNOSIS — M25552 Pain in left hip: Secondary | ICD-10-CM | POA: Diagnosis not present

## 2020-01-17 DIAGNOSIS — M25551 Pain in right hip: Secondary | ICD-10-CM | POA: Diagnosis not present

## 2020-01-19 ENCOUNTER — Other Ambulatory Visit: Payer: Self-pay | Admitting: Family Medicine

## 2020-01-19 DIAGNOSIS — J328 Other chronic sinusitis: Secondary | ICD-10-CM | POA: Diagnosis not present

## 2020-01-19 DIAGNOSIS — M25551 Pain in right hip: Secondary | ICD-10-CM

## 2020-01-21 DIAGNOSIS — M25551 Pain in right hip: Secondary | ICD-10-CM | POA: Diagnosis not present

## 2020-01-21 DIAGNOSIS — M25552 Pain in left hip: Secondary | ICD-10-CM | POA: Diagnosis not present

## 2020-01-25 DIAGNOSIS — M25551 Pain in right hip: Secondary | ICD-10-CM | POA: Diagnosis not present

## 2020-01-25 DIAGNOSIS — M25552 Pain in left hip: Secondary | ICD-10-CM | POA: Diagnosis not present

## 2020-01-26 ENCOUNTER — Other Ambulatory Visit: Payer: Self-pay

## 2020-01-26 ENCOUNTER — Ambulatory Visit: Payer: PPO | Admitting: Podiatry

## 2020-01-26 DIAGNOSIS — G5791 Unspecified mononeuropathy of right lower limb: Secondary | ICD-10-CM | POA: Diagnosis not present

## 2020-01-26 DIAGNOSIS — M778 Other enthesopathies, not elsewhere classified: Secondary | ICD-10-CM

## 2020-01-26 NOTE — Progress Notes (Signed)
He presents today for pain in his right foot.  States that it feels better than it usually does there is stating that he has not been walking as much as he usually does.  He presents today with 2 Axis I in the first intermetatarsal space and the other in the proxima fourth intermetatarsal space.  Objective: Pulses are palpable pain on palpation to the areas marked first and fourth intermetatarsal spaces respectively.  Assessment: Capsulitis of the first metatarsophalangeal joint laterally sesamoids.  Neuritis or capsulitis fourth interdigital space proximally.  Plan: This point I injected both areas with 10 mg of Kenalog 5 mg Marcaine and I will follow-up with him in 6 months

## 2020-01-27 DIAGNOSIS — M25551 Pain in right hip: Secondary | ICD-10-CM | POA: Diagnosis not present

## 2020-01-27 DIAGNOSIS — M25552 Pain in left hip: Secondary | ICD-10-CM | POA: Diagnosis not present

## 2020-02-07 DIAGNOSIS — M1612 Unilateral primary osteoarthritis, left hip: Secondary | ICD-10-CM | POA: Diagnosis not present

## 2020-02-09 DIAGNOSIS — J329 Chronic sinusitis, unspecified: Secondary | ICD-10-CM | POA: Diagnosis not present

## 2020-02-09 DIAGNOSIS — J189 Pneumonia, unspecified organism: Secondary | ICD-10-CM | POA: Diagnosis not present

## 2020-02-09 DIAGNOSIS — J3489 Other specified disorders of nose and nasal sinuses: Secondary | ICD-10-CM | POA: Diagnosis not present

## 2020-02-09 DIAGNOSIS — J45909 Unspecified asthma, uncomplicated: Secondary | ICD-10-CM | POA: Diagnosis not present

## 2020-02-09 DIAGNOSIS — Z23 Encounter for immunization: Secondary | ICD-10-CM | POA: Diagnosis not present

## 2020-02-09 DIAGNOSIS — Z87891 Personal history of nicotine dependence: Secondary | ICD-10-CM | POA: Diagnosis not present

## 2020-02-09 DIAGNOSIS — J440 Chronic obstructive pulmonary disease with acute lower respiratory infection: Secondary | ICD-10-CM | POA: Diagnosis not present

## 2020-02-09 DIAGNOSIS — I440 Atrioventricular block, first degree: Secondary | ICD-10-CM | POA: Diagnosis not present

## 2020-02-09 DIAGNOSIS — J449 Chronic obstructive pulmonary disease, unspecified: Secondary | ICD-10-CM | POA: Diagnosis not present

## 2020-02-09 DIAGNOSIS — J90 Pleural effusion, not elsewhere classified: Secondary | ICD-10-CM | POA: Diagnosis not present

## 2020-02-09 DIAGNOSIS — Z7951 Long term (current) use of inhaled steroids: Secondary | ICD-10-CM | POA: Diagnosis not present

## 2020-02-09 DIAGNOSIS — K219 Gastro-esophageal reflux disease without esophagitis: Secondary | ICD-10-CM | POA: Diagnosis not present

## 2020-02-09 DIAGNOSIS — Z792 Long term (current) use of antibiotics: Secondary | ICD-10-CM | POA: Diagnosis not present

## 2020-02-15 ENCOUNTER — Telehealth: Payer: Self-pay

## 2020-02-15 ENCOUNTER — Other Ambulatory Visit: Payer: Self-pay | Admitting: *Deleted

## 2020-02-15 DIAGNOSIS — R350 Frequency of micturition: Secondary | ICD-10-CM

## 2020-02-15 MED ORDER — TAMSULOSIN HCL 0.4 MG PO CAPS: 0.4000 mg | ORAL_CAPSULE | Freq: Every day | ORAL | 3 refills | Status: DC

## 2020-02-15 NOTE — Telephone Encounter (Signed)
Copied from [place] [id number]. Topic: General - Inquiry >> Feb 15, 2020  8:55 AM Hinda Lenis D wrote: PT has question about this meds, does he need to keep taking them / please advise /  diltiazem (CARDIZEM CD) 180 MG 24 hr capsule [[id number]]

## 2020-02-23 NOTE — Telephone Encounter (Signed)
yes

## 2020-02-23 NOTE — Telephone Encounter (Signed)
Patient advised that he does need to keep taking Dilitiazem.

## 2020-03-02 ENCOUNTER — Other Ambulatory Visit: Payer: Self-pay

## 2020-03-02 ENCOUNTER — Ambulatory Visit (INDEPENDENT_AMBULATORY_CARE_PROVIDER_SITE_OTHER): Payer: PPO

## 2020-03-02 DIAGNOSIS — Z23 Encounter for immunization: Secondary | ICD-10-CM | POA: Diagnosis not present

## 2020-03-09 DIAGNOSIS — H401132 Primary open-angle glaucoma, bilateral, moderate stage: Secondary | ICD-10-CM | POA: Diagnosis not present

## 2020-03-20 ENCOUNTER — Ambulatory Visit: Payer: PPO | Attending: Internal Medicine

## 2020-03-20 DIAGNOSIS — Z23 Encounter for immunization: Secondary | ICD-10-CM

## 2020-03-20 NOTE — Progress Notes (Signed)
   Covid-19 Vaccination [facility]  Name:  [name]    MRN: [id number] DOB: [birth year]  03/20/2020  [name] was observed post Covid-19 immunization for 15 minutes without incident. He was provided with Vaccine Information Sheet and instruction to access the V-Safe system.   [name] was instructed to call 911 with any severe reactions post vaccine: Marland Kitchen Difficulty breathing  . Swelling of face and throat  . A fast heartbeat  . A bad rash all over body  . Dizziness and weakness

## 2020-04-11 DIAGNOSIS — J328 Other chronic sinusitis: Secondary | ICD-10-CM | POA: Diagnosis not present

## 2020-05-01 NOTE — Progress Notes (Signed)
I,[name],acting as a scribe for Wilhemena Durie, MD.,have documented all relevant documentation on the behalf of Wilhemena Durie, MD,as directed by  Wilhemena Durie, MD while in the presence of Wilhemena Durie, MD.   Established patient visit   Patient: [name]   DOB: 1940/10/30   79 y.o. Male  MRN: [id number] Visit Date: 05/02/2020  Today's healthcare provider: Wilhemena Durie, MD   Chief Complaint  Patient presents with   Follow-up   Hypertension   Subjective    HPI  And is very active and goes to the joint gym 3 times a week.  He has had all Covid vaccines.  He has no complaints today. Hypertension, follow-up  BP Readings from Last 3 Encounters:  05/02/20 130/74  12/27/19 120/76  12/13/19 117/74   Wt Readings from Last 3 Encounters:  05/02/20 161 lb (73 kg)  12/27/19 164 lb (74.4 kg)  12/13/19 164 lb 6.4 oz (74.6 kg)     He was last seen for hypertension 4 months ago.  BP at that visit was 120/76. Management since that visit includes; labs checked showing-stable. He reports good compliance with treatment. He is not having side effects. none He is exercising. He is adherent to low salt diet.   Outside blood pressures are 120/72.  He does not smoke.  Use of agents associated with hypertension: none.   --------------------------------------------------------------------       Medications: Outpatient Medications Prior to Visit  Medication Sig   ADVAIR HFA 230-21 MCG/ACT inhaler Inhale 2 puffs into the lungs 2 (two) times daily.    albuterol (PROVENTIL HFA;VENTOLIN HFA) 108 (90 Base) MCG/ACT inhaler Inhale into the lungs every 6 (six) hours as needed for wheezing or shortness of breath.    azithromycin (ZITHROMAX) 250 MG tablet Take 250 mg by mouth daily.    brimonidine (ALPHAGAN) 0.15 % ophthalmic solution Place 1 drop into both eyes 2 (two) times daily.    diltiazem (CARDIZEM CD) 180 MG 24 hr capsule Take 1 capsule (180 mg  total) by mouth daily.   dorzolamide (TRUSOPT) 2 % ophthalmic solution Place 1 drop into both eyes 2 (two) times daily.    finasteride (PROSCAR) 5 MG tablet Take 1 tablet (5 mg total) by mouth daily.   fluticasone (FLONASE) 50 MCG/ACT nasal spray Place 2 sprays into both nostrils 2 (two) times daily.    latanoprost (XALATAN) 0.005 % ophthalmic solution Place 1 drop into both eyes at bedtime.    Melatonin 5 MG TABS Take 1 tablet by mouth at bedtime.    montelukast (SINGULAIR) 10 MG tablet Take 1 tablet (10 mg total) by mouth at bedtime.   omeprazole (PRILOSEC) 20 MG capsule TAKE 1 CAPSULE BY MOUTH DAILY   ondansetron (ZOFRAN) 4 MG tablet Take 1 tablet (4 mg total) by mouth every 8 (eight) hours as needed for nausea or vomiting.   tamsulosin (FLOMAX) 0.4 MG CAPS capsule Take 1 capsule (0.4 mg total) by mouth daily.   triamcinolone ointment (KENALOG) 0.1 % Apply 1 application topically as directed. To cracks on fingers as needed   XARELTO 10 MG TABS tablet Take 10 mg by mouth daily.    celecoxib (CELEBREX) 200 MG capsule TAKE 1 CAPSULE BY MOUTH ONCE DAILY (Patient not taking: Reported on 05/02/2020)   gabapentin (NEURONTIN) 400 MG capsule Take 400 mg by mouth daily.  (Patient not taking: Reported on 05/02/2020)   tiotropium (SPIRIVA HANDIHALER) 18 MCG inhalation capsule Place 18 mcg into  inhaler and inhale daily.    No facility-administered medications prior to visit.    Review of Systems  Constitutional: Negative for appetite change, chills and fever.  Respiratory: Negative for chest tightness, shortness of breath and wheezing.   Cardiovascular: Negative for chest pain and palpitations.  Gastrointestinal: Negative for abdominal pain, nausea and vomiting.       Objective    BP 130/74 (BP Location: Left Arm, Patient Position: Sitting, Cuff Size: Normal)    Pulse 65    Temp 98.2 F (36.8 C) (Oral)    Resp 16    Ht 6\' 2"  (1.88 m)    Wt 161 lb (73 kg)    SpO2 99%    BMI 20.67  kg/m  BP Readings from Last 3 Encounters:  05/02/20 130/74  12/27/19 120/76  12/13/19 117/74   Wt Readings from Last 3 Encounters:  05/02/20 161 lb (73 kg)  12/27/19 164 lb (74.4 kg)  12/13/19 164 lb 6.4 oz (74.6 kg)      Physical Exam Vitals reviewed.  HENT:     Head: Normocephalic and atraumatic.     Right Ear: External ear normal.     Left Ear: External ear normal.  Eyes:     General: No scleral icterus. Cardiovascular:     Rate and Rhythm: Normal rate and regular rhythm.     Heart sounds: Normal heart sounds.  Pulmonary:     Effort: Pulmonary effort is normal.     Breath sounds: Normal breath sounds.  Abdominal:     Palpations: Abdomen is soft.  Skin:    General: Skin is warm and dry.  Neurological:     General: No focal deficit present.     Mental Status: He is alert and oriented to person, place, and time.  Psychiatric:        Mood and Affect: Mood normal.        Behavior: Behavior normal.        Thought Content: Thought content normal.        Judgment: Judgment normal.       No results found for any visits on 05/02/20.  Assessment & Plan     1. Essential hypertension Controlled.  2. CAFL (chronic airflow limitation) (HCC) Niccoli stable  3. Malnutrition of mild degree ([place])    Return in about 35 weeks (around 01/02/2021).         Keyerra Lamere Cranford Mon, MD  [facility] [phone] (phone) [phone] (fax)  [place]

## 2020-05-02 ENCOUNTER — Encounter: Payer: Self-pay | Admitting: Family Medicine

## 2020-05-02 ENCOUNTER — Ambulatory Visit (INDEPENDENT_AMBULATORY_CARE_PROVIDER_SITE_OTHER): Payer: PPO | Admitting: Family Medicine

## 2020-05-02 ENCOUNTER — Other Ambulatory Visit: Payer: Self-pay

## 2020-05-02 VITALS — BP 130/74 | HR 65 | Temp 98.2°F | Resp 16 | Ht 74.0 in | Wt 161.0 lb

## 2020-05-02 DIAGNOSIS — J449 Chronic obstructive pulmonary disease, unspecified: Secondary | ICD-10-CM | POA: Diagnosis not present

## 2020-05-02 DIAGNOSIS — E441 Mild protein-calorie malnutrition: Secondary | ICD-10-CM | POA: Diagnosis not present

## 2020-05-02 DIAGNOSIS — I10 Essential (primary) hypertension: Secondary | ICD-10-CM

## 2020-05-22 ENCOUNTER — Other Ambulatory Visit: Payer: Self-pay

## 2020-05-22 ENCOUNTER — Ambulatory Visit: Payer: PPO | Admitting: Urology

## 2020-05-22 VITALS — BP 126/81 | HR 74 | Ht 74.0 in | Wt 162.0 lb

## 2020-05-22 DIAGNOSIS — R351 Nocturia: Secondary | ICD-10-CM

## 2020-05-22 MED ORDER — TAMSULOSIN HCL 0.4 MG PO CAPS: 0.8000 mg | ORAL_CAPSULE | Freq: Every day | ORAL | 3 refills | Status: DC

## 2020-05-22 MED ORDER — TAMSULOSIN HCL 0.4 MG PO CAPS: 0.4000 mg | ORAL_CAPSULE | Freq: Every day | ORAL | 3 refills | Status: DC

## 2020-05-22 NOTE — Progress Notes (Signed)
05/22/2020 8:34 AM   [name] [birth year] [id number]  Referring provider: Jerrol Banana., MD [address] [place],  [place] [address]  No chief complaint on file.   HPI: I was consulted to assess the patient's acute onset of nighttime frequency every 2 hours. It is now every 3 hours. He stopped gabapentin which had been added and he had leg edema. Right leg is normalized. Left leg much improved. He is still using TED hose. Flow is reasonable. Frequency every 2 hours during the day.  Patient nighttime frequency likely from fluid mobilization. Role of alpha-blocker versus watchful waiting described. Patient has an ill-defined right middle discomfort that I did not think was from the genitourinary system. It comes and goes and is been present for a few weeks. We did not x-ray him today. He would like to try Flomax. Reassess in 6 weeks. If he normalizeswe might want to try him off it in the future.  Today Flow is much better on the medication but still gets up every 2 hours to void.  No infections or blood  Today Flow was good.  Mild frequency and nocturia stable.  No blood.  No infections Small benign prostate The patient want to try 2 tablets a day because of his nocturia every 2 hours     PMH: Past Medical History:  Diagnosis Date  . AB (asthmatic bronchitis)   . Actinic keratosis   . Allergic rhinitis   . Arthropathy   . Asthma   . Cellulitis   . DVT (deep venous thrombosis) ([place])   . Foreign body in skin   . GERD (gastroesophageal reflux disease)   . Glaucoma   . Hx MRSA infection   . Hx of basal cell carcinoma 02/26/2018   L lat mid back  . Hypertension    Resolved - no meds  . Insomnia   . Osteoarthrosis   . Upper back strain     Surgical History: Past Surgical History:  Procedure Laterality Date  . APPENDECTOMY    . BRONCHOSCOPY    . CATARACT EXTRACTION W/PHACO Left 09/01/2019   Procedure: CATARACT EXTRACTION PHACO  AND INTRAOCULAR LENS PLACEMENT ([facility]) LEFT 11.72 01:12.2 16.2%;  Surgeon: Leandrew Koyanagi, MD;  Location: [facility];  Service: Ophthalmology;  Laterality: Left;  . CATARACT EXTRACTION W/PHACO Right 09/22/2019   Procedure: CATARACT EXTRACTION PHACO AND INTRAOCULAR LENS PLACEMENT ([place]) RIGHT;  Surgeon: Leandrew Koyanagi, MD;  Location: [facility];  Service: Ophthalmology;  Laterality: Right;  6.88 103.4 10.9%  . LEFT HEART CATH AND CORONARY ANGIOGRAPHY N/A 08/19/2016   Procedure: Left Heart Cath and Coronary Angiography;  Surgeon: Dionisio David, MD;  Location: [facility];  Service: Cardiovascular;  Laterality: N/A;  . NASAL SINUS SURGERY  09/27/2014    Home Medications:  Allergies as of 05/22/2020      Reactions   Timolol Maleate [timolol Maleate] Shortness Of Breath   Atorvastatin Swelling   Swelling in legs, dizziness   Bactrim [sulfamethoxazole-trimethoprim]    Pt says "it takes his skin pigment off   Budesonide-formoterol Fumarate Other (See Comments)   SYMBICORT Patient reports dizziness and trembling/shaky feeling.   Lisinopril Other (See Comments)   cough      Medication List       Accurate as of May 22, 2020  8:34 AM. If you have any questions, ask your nurse or doctor.        STOP taking these medications   azithromycin  250 MG tablet Commonly known as: ZITHROMAX Stopped by: Reece Packer, MD   celecoxib 200 MG capsule Commonly known as: CELEBREX Stopped by: Reece Packer, MD   gabapentin 400 MG capsule Commonly known as: NEURONTIN Stopped by: Reece Packer, MD   ondansetron 4 MG tablet Commonly known as: Zofran Stopped by: Reece Packer, MD     TAKE these medications   Advair HFA 230-21 MCG/ACT inhaler Generic drug: fluticasone-salmeterol Inhale 2 puffs into the lungs 2 (two) times daily.   albuterol 108 (90 Base) MCG/ACT inhaler Commonly known as: VENTOLIN HFA Inhale into the lungs every 6 (six)  hours as needed for wheezing or shortness of breath.   brimonidine 0.15 % ophthalmic solution Commonly known as: ALPHAGAN Place 1 drop into both eyes 2 (two) times daily.   diltiazem 180 MG 24 hr capsule Commonly known as: CARDIZEM CD Take 1 capsule (180 mg total) by mouth daily.   dorzolamide 2 % ophthalmic solution Commonly known as: TRUSOPT Place 1 drop into both eyes 2 (two) times daily.   finasteride 5 MG tablet Commonly known as: PROSCAR Take 1 tablet (5 mg total) by mouth daily.   fluticasone 50 MCG/ACT nasal spray Commonly known as: FLONASE Place 2 sprays into both nostrils 2 (two) times daily.   latanoprost 0.005 % ophthalmic solution Commonly known as: XALATAN Place 1 drop into both eyes at bedtime.   melatonin 5 MG Tabs Take 1 tablet by mouth at bedtime.   montelukast 10 MG tablet Commonly known as: SINGULAIR Take 1 tablet (10 mg total) by mouth at bedtime.   omeprazole 20 MG capsule Commonly known as: PRILOSEC TAKE 1 CAPSULE BY MOUTH DAILY   Spiriva HandiHaler 18 MCG inhalation capsule Generic drug: tiotropium Place 18 mcg into inhaler and inhale daily.   tamsulosin 0.4 MG Caps capsule Commonly known as: FLOMAX Take 1 capsule (0.4 mg total) by mouth daily.   triamcinolone ointment 0.1 % Commonly known as: KENALOG Apply 1 application topically as directed. To cracks on fingers as needed   Xarelto 10 MG Tabs tablet Generic drug: rivaroxaban Take 10 mg by mouth daily.       Allergies:  Allergies  Allergen Reactions  . Timolol Maleate [Timolol Maleate] Shortness Of Breath  . Atorvastatin Swelling    Swelling in legs, dizziness  . Bactrim [Sulfamethoxazole-Trimethoprim]     Pt says "it takes his skin pigment off  . Budesonide-Formoterol Fumarate Other (See Comments)    SYMBICORT Patient reports dizziness and trembling/shaky feeling.  . Lisinopril Other (See Comments)    cough    Family History: Family History  Problem Relation Age of  Onset  . Breast cancer Sister   . Heart failure Mother   . Pancreatic cancer Sister   . Alzheimer's disease Father   . Brain cancer Daughter     Social History:  reports that he quit smoking about 61 years ago. His smoking use included cigarettes. He has a 1.50 pack-year smoking history. He has never used smokeless tobacco. He reports current alcohol use of about 3.0 - 4.0 standard drinks of alcohol per week. He reports that he does not use drugs.  ROS:                                        Physical Exam: BP 126/81   Pulse 74   Ht 6\' 2"  (1.88 m)  Wt 73.5 kg   BMI 20.80 kg/m   Constitutional:  Alert and oriented, No acute distress.  Laboratory Data: Lab Results  Component Value Date   WBC 6.1 12/28/2019   HGB 15.1 12/28/2019   HCT 44.3 12/28/2019   MCV 94 12/28/2019   PLT 194 12/28/2019    Lab Results  Component Value Date   CREATININE 1.08 12/28/2019    Lab Results  Component Value Date   PSA 3.6 11/17/2013    No results found for: TESTOSTERONE  Lab Results  Component Value Date   HGBA1C 5.4 08/18/2016    Urinalysis    Component Value Date/Time   COLORURINE YELLOW (A) 04/29/2018 1335   APPEARANCEUR CLEAR (A) 04/29/2018 1335   APPEARANCEUR Clear 01/26/2018 1303   LABSPEC 1.014 04/29/2018 1335   LABSPEC 1.012 04/07/2014 1745   PHURINE 7.0 04/29/2018 1335   GLUCOSEU NEGATIVE 04/29/2018 1335   GLUCOSEU Negative 04/07/2014 1745   HGBUR NEGATIVE 04/29/2018 1335   BILIRUBINUR negative 03/11/2019 1157   BILIRUBINUR Negative 01/26/2018 1303   BILIRUBINUR Negative 04/07/2014 1745   KETONESUR 20 (A) 04/29/2018 1335   PROTEINUR Negative 03/11/2019 1157   PROTEINUR NEGATIVE 04/29/2018 1335   UROBILINOGEN 0.2 03/11/2019 1157   NITRITE Negative 03/11/2019 1157   NITRITE NEGATIVE 04/29/2018 1335   LEUKOCYTESUR Negative 03/11/2019 1157   LEUKOCYTESUR Negative 01/26/2018 1303   LEUKOCYTESUR Negative 04/07/2014 1745    Pertinent  Imaging:   Assessment & Plan: Flomax 0.8 mg for 90 days and 3 refills sent  There are no diagnoses linked to this encounter.  No follow-ups on file.  Reece Packer, MD  [facility] [address] [place], [place] [address] [phone]

## 2020-05-23 DIAGNOSIS — J324 Chronic pansinusitis: Secondary | ICD-10-CM | POA: Diagnosis not present

## 2020-05-24 ENCOUNTER — Encounter: Payer: Self-pay | Admitting: Dermatology

## 2020-05-24 ENCOUNTER — Other Ambulatory Visit: Payer: Self-pay

## 2020-05-24 ENCOUNTER — Ambulatory Visit: Payer: PPO | Admitting: Dermatology

## 2020-05-24 DIAGNOSIS — L300 Nummular dermatitis: Secondary | ICD-10-CM | POA: Diagnosis not present

## 2020-05-24 DIAGNOSIS — L820 Inflamed seborrheic keratosis: Secondary | ICD-10-CM

## 2020-05-24 MED ORDER — TRIAMCINOLONE ACETONIDE 0.1 % EX CREA: 1.0000 "application " | TOPICAL_CREAM | Freq: Two times a day (BID) | CUTANEOUS | 2 refills | Status: DC | PRN

## 2020-05-24 NOTE — Patient Instructions (Addendum)
Cryotherapy Aftercare  . Wash gently with soap and water everyday.   Marland Kitchen Apply Vaseline and Band-Aid daily until healed.  Prior to procedure, discussed risks of blister formation, small wound, skin dyspigmentation, or rare scar following cryotherapy.   Gentle Skin Care Guide  1. Bathe no more than once a day.  2. Avoid bathing in hot water  3. Use a mild soap like Dove, Vanicream, Cetaphil, CeraVe. Can use Lever 2000 or Cetaphil antibacterial soap  4. Use soap only where you need it. On most days, use it under your arms, between your legs, and on your feet. Let the water rinse other areas unless visibly dirty.  5. When you get out of the bath/shower, use a towel to gently blot your skin dry, don't rub it.  6. While your skin is still a little damp, apply a moisturizing cream such as Vanicream, CeraVe, Cetaphil, Eucerin, Sarna lotion or plain Vaseline Jelly. For hands apply Neutrogena [place] Hand Cream or Excipial Hand Cream.  7. Reapply moisturizer any time you start to itch or feel dry.  8. Sometimes using free and clear laundry detergents can be helpful. Fabric softener sheets should be avoided. Downy Free & Gentle liquid, or any liquid fabric softener that is free of dyes and perfumes, it acceptable to use  9. If your doctor has given you prescription creams you may apply moisturizers over them    Topical steroids (such as triamcinolone, fluocinolone, fluocinonide, mometasone, clobetasol, halobetasol, betamethasone, hydrocortisone) can cause thinning and lightening of the skin if they are used for too long in the same area. Your physician has selected the right strength medicine for your problem and area affected on the body. Please use your medication only as directed by your physician to prevent side effects.

## 2020-05-24 NOTE — Progress Notes (Signed)
   Follow-Up Visit   Subjective  [name] is a 79 y.o. male who presents for the following: Rash (Left leg above ankle. Dur: 2 months. Itching. Has tried OTC HC cream and Neosporin, no help.) and lesion (Back. Noticed for 2 weeks. Red, itching. No Hx of Tx. ).    The following portions of the chart were reviewed this encounter and updated as appropriate:     Review of Systems: No other skin or systemic complaints except as noted in HPI or Assessment and Plan.  Objective  Well appearing patient in no apparent distress; mood and affect are within normal limits.  A focused examination was performed including face, back, left leg. Relevant physical exam findings are noted in the Assessment and Plan.  Objective  Left Lower Leg and right lower leg: Erythematous scaly patches, left greater than right  Objective  Right Mid  Back x1: Erythematous keratotic or waxy stuck-on papule   Assessment & Plan  Nummular dermatitis Left Lower Leg and right lower leg  Start Triamcinolone 0.1% cream BID PRN rash, D/C when clear. D/C Neosporin. Recommend mild soap and moisturizing cream 1-2 times daily.    Topical steroids (such as triamcinolone, fluocinolone, fluocinonide, mometasone, clobetasol, halobetasol, betamethasone, hydrocortisone) can cause thinning and lightening of the skin if they are used for too long in the same area. Your physician has selected the right strength medicine for your problem and area affected on the body. Please use your medication only as directed by your physician to prevent side effects.    Call office if not improving in 2 weeks.   triamcinolone (KENALOG) 0.1 % - Left Lower Leg and right lower leg  Inflamed seborrheic keratosis Right Mid  Back x1  Destruction of lesion - Right Mid  Back x1  Destruction method: cryotherapy   Informed consent: discussed and consent obtained   Lesion destroyed using liquid nitrogen: Yes   Region frozen until ice ball  extended beyond lesion: Yes   Outcome: patient tolerated procedure well with no complications   Post-procedure details: wound care instructions given    Return for follow up as scheduled with Dr. Nehemiah Massed.   I, Emelia Salisbury, CMA, am acting as scribe for Brendolyn Patty, MD.  Documentation: I have reviewed the above documentation for accuracy and completeness, and I agree with the above.  Brendolyn Patty MD

## 2020-05-29 DIAGNOSIS — R059 Cough, unspecified: Secondary | ICD-10-CM | POA: Diagnosis not present

## 2020-05-29 DIAGNOSIS — R0989 Other specified symptoms and signs involving the circulatory and respiratory systems: Secondary | ICD-10-CM | POA: Diagnosis not present

## 2020-06-02 DIAGNOSIS — R059 Cough, unspecified: Secondary | ICD-10-CM | POA: Diagnosis not present

## 2020-06-06 ENCOUNTER — Other Ambulatory Visit: Payer: Self-pay | Admitting: Family Medicine

## 2020-06-06 DIAGNOSIS — E785 Hyperlipidemia, unspecified: Secondary | ICD-10-CM

## 2020-06-07 ENCOUNTER — Other Ambulatory Visit: Payer: Self-pay | Admitting: Family Medicine

## 2020-06-07 DIAGNOSIS — E785 Hyperlipidemia, unspecified: Secondary | ICD-10-CM

## 2020-07-19 ENCOUNTER — Encounter: Payer: PPO | Admitting: Dermatology

## 2020-07-24 ENCOUNTER — Ambulatory Visit: Payer: PPO | Admitting: Dermatology

## 2020-07-24 ENCOUNTER — Other Ambulatory Visit: Payer: Self-pay

## 2020-07-24 DIAGNOSIS — D485 Neoplasm of uncertain behavior of skin: Secondary | ICD-10-CM

## 2020-07-24 DIAGNOSIS — L578 Other skin changes due to chronic exposure to nonionizing radiation: Secondary | ICD-10-CM

## 2020-07-24 DIAGNOSIS — L570 Actinic keratosis: Secondary | ICD-10-CM | POA: Diagnosis not present

## 2020-07-24 DIAGNOSIS — D224 Melanocytic nevi of scalp and neck: Secondary | ICD-10-CM

## 2020-07-24 DIAGNOSIS — L300 Nummular dermatitis: Secondary | ICD-10-CM

## 2020-07-24 NOTE — Progress Notes (Signed)
Follow-Up Visit   Subjective  [name] is a 80 y.o. male who presents for the following: Follow-up (Nummular dermatitis of left lower leg follow up - he saw Dr Nicole Kindred and she prescribed TMC cream and it is much better, itching has stopped) and Other (Spots of right ear and left post neck).  The following portions of the chart were reviewed this encounter and updated as appropriate:   Tobacco  Allergies  Meds  Problems  Med Hx  Surg Hx  Fam Hx     Review of Systems:  No other skin or systemic complaints except as noted in HPI or Assessment and Plan.  Objective  Well appearing patient in no apparent distress; mood and affect are within normal limits.  A focused examination was performed including left lower leg. Relevant physical exam findings are noted in the Assessment and Plan.  Objective  Right [place]: Erythematous thin papules/macules with gritty scale.   Objective  Left post neck at hairline: 0.6 cm pearly papule   Assessment & Plan    Actinic Damage - chronic, secondary to cumulative UV radiation exposure/sun exposure over time - diffuse scaly erythematous macules with underlying dyspigmentation - Recommend daily broad spectrum sunscreen SPF 30+ to sun-exposed areas, reapply every 2 hours as needed.  - Call for new or changing lesions.  Nummular dermatitis Left Lower Leg - Anterior Discontinue TMC 0.1% cream - may restart with flares. Recommend Cerave cream daily. Other Related Medications triamcinolone (KENALOG) 0.1 %  AK (actinic keratosis) Right Earl[place] Recheck on follow up  Destruction of lesion - Right [place] Complexity: simple   Destruction method: cryotherapy   Informed consent: discussed and consent obtained   Timeout:  patient name, date of birth, surgical site, and procedure verified Lesion destroyed using liquid nitrogen: Yes   Region frozen until ice ball extended beyond lesion: Yes   Outcome: patient tolerated procedure well with  no complications   Post-procedure details: wound care instructions given    Neoplasm of uncertain behavior of skin Left post neck at hairline  Epidermal / dermal shaving  Lesion diameter (cm):  0.6 Informed consent: discussed and consent obtained   Timeout: patient name, date of birth, surgical site, and procedure verified   Procedure prep:  Patient was prepped and draped in usual sterile fashion Prep type:  Isopropyl alcohol Anesthesia: the lesion was anesthetized in a standard fashion   Anesthetic:  1% lidocaine w/ epinephrine 1-100,000 buffered w/ 8.4% NaHCO3 Instrument used: flexible razor blade   Hemostasis achieved with: pressure, aluminum chloride and electrodesiccation   Outcome: patient tolerated procedure well   Post-procedure details: sterile dressing applied and wound care instructions given   Dressing type: bandage and petrolatum    Destruction of lesion Complexity: extensive   Destruction method: electrodesiccation and curettage   Informed consent: discussed and consent obtained   Timeout:  patient name, date of birth, surgical site, and procedure verified Procedure prep:  Patient was prepped and draped in usual sterile fashion Prep type:  Isopropyl alcohol Anesthesia: the lesion was anesthetized in a standard fashion   Anesthetic:  1% lidocaine w/ epinephrine 1-100,000 buffered w/ 8.4% NaHCO3 Curettage performed in three different directions: Yes   Electrodesiccation performed over the curetted area: Yes   Lesion length (cm):  0.6 Lesion width (cm):  0.6 Margin per side (cm):  0.3 Final wound size (cm):  1.2 Hemostasis achieved with:  pressure and aluminum chloride Outcome: patient tolerated procedure well with no complications   Post-procedure  details: sterile dressing applied and wound care instructions given   Dressing type: bandage and petrolatum    Specimen 1 - Surgical pathology Differential Diagnosis: BCC vs other  Check Margins: No 0.6 cm pearly  papule EDC today  Return for Follow up as scheduled, [facility].  I, Ashok Cordia, CMA, am acting as scribe for Sarina Ser, MD . Documentation: I have reviewed the above documentation for accuracy and completeness, and I agree with the above.  Sarina Ser, MD

## 2020-07-24 NOTE — Patient Instructions (Signed)

## 2020-07-27 ENCOUNTER — Encounter: Payer: Self-pay | Admitting: Dermatology

## 2020-07-27 ENCOUNTER — Telehealth: Payer: Self-pay

## 2020-07-27 NOTE — Telephone Encounter (Signed)
Left message for patient to call office for results/hd 

## 2020-07-27 NOTE — Telephone Encounter (Signed)
-----   Message from Ralene Bathe, MD sent at 07/26/2020 12:56 PM EST ----- Diagnosis Skin , left post neck at hairline MELANOCYTIC NEVUS, INTRADERMAL TYPE, IRRITATED  Benign irritated mole No further treatment needed.

## 2020-07-31 ENCOUNTER — Encounter: Payer: Self-pay | Admitting: Podiatry

## 2020-07-31 ENCOUNTER — Other Ambulatory Visit: Payer: Self-pay

## 2020-07-31 ENCOUNTER — Ambulatory Visit: Payer: PPO | Admitting: Podiatry

## 2020-07-31 DIAGNOSIS — M778 Other enthesopathies, not elsewhere classified: Secondary | ICD-10-CM | POA: Diagnosis not present

## 2020-07-31 DIAGNOSIS — G5791 Unspecified mononeuropathy of right lower limb: Secondary | ICD-10-CM

## 2020-07-31 MED ORDER — TRIAMCINOLONE ACETONIDE 40 MG/ML IJ SUSP
40.0000 mg | Freq: Once | INTRAMUSCULAR | Status: AC
  Administered 2020-07-31: 40 mg

## 2020-07-31 NOTE — Progress Notes (Signed)
[name] presents today for follow-up of his capsulitis of his first and fourth intermetatarsal spaces.  States that I do not know what we use last time but he was doing great until just recently.  Objective: Vital signs are stable he is alert and oriented x3 presents today with an X marked in the proximal first and fourth intermetatarsal spaces of the right foot.  These are moderately tender on palpation pulses remain palpable.  Assessment: Pain in limb secondary to capsulitis.  Plan: I injected these areas today with 10 mg of Kenalog 5 mg Marcaine x2 right foot only.  Follow-up with him in 6 months

## 2020-08-08 ENCOUNTER — Telehealth: Payer: Self-pay

## 2020-08-08 NOTE — Telephone Encounter (Signed)
Patient came into the office and advised of biopsy results.

## 2020-08-08 NOTE — Telephone Encounter (Signed)
-----   Message from Ralene Bathe, MD sent at 07/26/2020 12:56 PM EST ----- Diagnosis Skin , left post neck at hairline MELANOCYTIC NEVUS, INTRADERMAL TYPE, IRRITATED  Benign irritated mole No further treatment needed.

## 2020-08-29 DIAGNOSIS — I8290 Acute embolism and thrombosis of unspecified vein: Secondary | ICD-10-CM | POA: Diagnosis not present

## 2020-08-30 ENCOUNTER — Other Ambulatory Visit: Payer: Self-pay | Admitting: Family Medicine

## 2020-08-30 DIAGNOSIS — N401 Enlarged prostate with lower urinary tract symptoms: Secondary | ICD-10-CM

## 2020-09-05 DIAGNOSIS — H401132 Primary open-angle glaucoma, bilateral, moderate stage: Secondary | ICD-10-CM | POA: Diagnosis not present

## 2020-09-06 DIAGNOSIS — R768 Other specified abnormal immunological findings in serum: Secondary | ICD-10-CM | POA: Diagnosis not present

## 2020-09-06 DIAGNOSIS — R059 Cough, unspecified: Secondary | ICD-10-CM | POA: Diagnosis not present

## 2020-09-06 DIAGNOSIS — J471 Bronchiectasis with (acute) exacerbation: Secondary | ICD-10-CM | POA: Diagnosis not present

## 2020-09-06 DIAGNOSIS — J4550 Severe persistent asthma, uncomplicated: Secondary | ICD-10-CM | POA: Diagnosis not present

## 2020-09-12 ENCOUNTER — Other Ambulatory Visit: Payer: Self-pay | Admitting: Family Medicine

## 2020-09-12 DIAGNOSIS — I479 Paroxysmal tachycardia, unspecified: Secondary | ICD-10-CM

## 2020-09-12 DIAGNOSIS — H401113 Primary open-angle glaucoma, right eye, severe stage: Secondary | ICD-10-CM | POA: Diagnosis not present

## 2020-09-14 ENCOUNTER — Other Ambulatory Visit: Payer: Self-pay

## 2020-09-14 ENCOUNTER — Encounter: Payer: Self-pay | Admitting: Dermatology

## 2020-09-14 ENCOUNTER — Ambulatory Visit: Payer: PPO | Admitting: Dermatology

## 2020-09-14 DIAGNOSIS — D229 Melanocytic nevi, unspecified: Secondary | ICD-10-CM

## 2020-09-14 DIAGNOSIS — D1800 Hemangioma unspecified site: Secondary | ICD-10-CM | POA: Diagnosis not present

## 2020-09-14 DIAGNOSIS — B360 Pityriasis versicolor: Secondary | ICD-10-CM | POA: Diagnosis not present

## 2020-09-14 DIAGNOSIS — L821 Other seborrheic keratosis: Secondary | ICD-10-CM

## 2020-09-14 DIAGNOSIS — L299 Pruritus, unspecified: Secondary | ICD-10-CM | POA: Diagnosis not present

## 2020-09-14 DIAGNOSIS — L578 Other skin changes due to chronic exposure to nonionizing radiation: Secondary | ICD-10-CM | POA: Diagnosis not present

## 2020-09-14 DIAGNOSIS — Z1283 Encounter for screening for malignant neoplasm of skin: Secondary | ICD-10-CM | POA: Diagnosis not present

## 2020-09-14 DIAGNOSIS — B353 Tinea pedis: Secondary | ICD-10-CM

## 2020-09-14 DIAGNOSIS — L814 Other melanin hyperpigmentation: Secondary | ICD-10-CM

## 2020-09-14 DIAGNOSIS — L820 Inflamed seborrheic keratosis: Secondary | ICD-10-CM | POA: Diagnosis not present

## 2020-09-14 DIAGNOSIS — Z85828 Personal history of other malignant neoplasm of skin: Secondary | ICD-10-CM | POA: Diagnosis not present

## 2020-09-14 DIAGNOSIS — L570 Actinic keratosis: Secondary | ICD-10-CM

## 2020-09-14 MED ORDER — KETOCONAZOLE 2 % EX CREA: 1.0000 "application " | TOPICAL_CREAM | Freq: Every day | CUTANEOUS | 11 refills | Status: DC

## 2020-09-14 MED ORDER — KETOCONAZOLE 2 % EX SHAM: 1.0000 "application " | MEDICATED_SHAMPOO | CUTANEOUS | 11 refills | Status: DC

## 2020-09-14 NOTE — Patient Instructions (Signed)

## 2020-09-14 NOTE — Progress Notes (Signed)
Follow-Up Visit   Subjective  [name] is a 80 y.o. male who presents for the following: Total body skin exam (Hx of BCC L mid back, Hx of AKs) and itchy spot (Back, ~2wks). The patient presents for Total-Body Skin Exam (TBSE) for skin cancer screening and mole check.  The following portions of the chart were reviewed this encounter and updated as appropriate:   Tobacco  Allergies  Meds  Problems  Med Hx  Surg Hx  Fam Hx     Review of Systems:  No other skin or systemic complaints except as noted in HPI or Assessment and Plan.  Objective  Well appearing patient in no apparent distress; mood and affect are within normal limits.  A full examination was performed including scalp, head, eyes, ears, nose, lips, neck, chest, axillae, abdomen, back, buttocks, bilateral upper extremities, bilateral lower extremities, hands, feet, fingers, toes, fingernails, and toenails. All findings within normal limits unless otherwise noted below.  Objective  Left Mid Back: Well healed scar with no evidence of recurrence.   Objective  trunk, arms: Hyper and hypopigmented macules trunk, arms  Objective  face/ears x 5, L hand x 2, R hand x 1 (8): Pink scaly macules   Objective  Scalp x 1: Erythematous keratotic or waxy stuck-on papule or plaque.   Objective  bil feet: Scaling and maceration web spaces and over distal and lateral soles.    Assessment & Plan  History of basal cell carcinoma (BCC) Left Mid Back Clear. Observe for recurrence. Call clinic for new or changing lesions.  Recommend regular skin exams, daily broad-spectrum spf 30+ sunscreen use, and photoprotection.     Tinea versicolor with pruritus of the back trunk, arms Chronic and persistent and tends to be recurrent.   Start Ketoconazole 2% shampoo 3d/wk for 6 weeks, then once monthly for maintenance, use on trunk, arms and scalp, let sit 5 minutes and rinse off ketoconazole (NIZORAL) 2 % shampoo - trunk, arms  AK  (actinic keratosis) (8) face/ears x 5, L hand x 2, R hand x 1 Destruction of lesion - face/ears x 5, L hand x 2, R hand x 1 Complexity: simple   Destruction method: cryotherapy   Informed consent: discussed and consent obtained   Timeout:  patient name, date of birth, surgical site, and procedure verified Lesion destroyed using liquid nitrogen: Yes   Region frozen until ice ball extended beyond lesion: Yes   Outcome: patient tolerated procedure well with no complications   Post-procedure details: wound care instructions given    Inflamed seborrheic keratosis Scalp x 1 Destruction of lesion - Scalp x 1 Complexity: simple   Destruction method: cryotherapy   Informed consent: discussed and consent obtained   Timeout:  patient name, date of birth, surgical site, and procedure verified Lesion destroyed using liquid nitrogen: Yes   Region frozen until ice ball extended beyond lesion: Yes   Outcome: patient tolerated procedure well with no complications   Post-procedure details: wound care instructions given    Tinea pedis of right foot bil feet Chronic and persistent.   Start Ketoconazole 2% cr qhs to feet ketoconazole (NIZORAL) 2 % cream - bil feet   Lentigines - Scattered tan macules - Due to sun exposure - Benign-appering, observe - Recommend daily broad spectrum sunscreen SPF 30+ to sun-exposed areas, reapply every 2 hours as needed. - Call for any changes  Seborrheic Keratoses - Stuck-on, waxy, tan-brown papules and/or plaques  - Benign-appearing - Discussed benign etiology  and prognosis. - Observe - Call for any changes  Melanocytic Nevi - Tan-brown and/or pink-flesh-colored symmetric macules and papules - Benign appearing on exam today - Observation - Call clinic for new or changing moles - Recommend daily use of broad spectrum spf 30+ sunscreen to sun-exposed areas.   Hemangiomas - Red papules - Discussed benign nature - Observe - Call for any  changes  Actinic Damage - Chronic condition, secondary to cumulative UV/sun exposure - diffuse scaly erythematous macules with underlying dyspigmentation - Recommend daily broad spectrum sunscreen SPF 30+ to sun-exposed areas, reapply every 2 hours as needed.  - Staying in the shade or wearing long sleeves, sun glasses (UVA+UVB protection) and wide brim hats (4-inch brim around the entire circumference of the hat) are also recommended for sun protection.  - Call for new or changing lesions.  Skin cancer screening performed today.  Return in about 1 year (around 09/14/2021) for TBSE, Hx of BCC, Hx of AKs.   I, Othelia Pulling, RMA, am acting as scribe for Sarina Ser, MD .  Documentation: I have reviewed the above documentation for accuracy and completeness, and I agree with the above.  Sarina Ser, MD

## 2020-09-15 DIAGNOSIS — J4550 Severe persistent asthma, uncomplicated: Secondary | ICD-10-CM | POA: Diagnosis not present

## 2020-09-15 DIAGNOSIS — R918 Other nonspecific abnormal finding of lung field: Secondary | ICD-10-CM | POA: Diagnosis not present

## 2020-09-15 DIAGNOSIS — J471 Bronchiectasis with (acute) exacerbation: Secondary | ICD-10-CM | POA: Diagnosis not present

## 2020-09-15 DIAGNOSIS — J90 Pleural effusion, not elsewhere classified: Secondary | ICD-10-CM | POA: Diagnosis not present

## 2020-09-15 DIAGNOSIS — J479 Bronchiectasis, uncomplicated: Secondary | ICD-10-CM | POA: Diagnosis not present

## 2020-09-28 ENCOUNTER — Other Ambulatory Visit: Payer: Self-pay

## 2020-09-28 ENCOUNTER — Encounter: Payer: Self-pay | Admitting: *Deleted

## 2020-09-28 ENCOUNTER — Emergency Department
Admission: EM | Admit: 2020-09-28 | Discharge: 2020-09-29 | Disposition: A | Discharge status: Home / Self Care | Payer: PPO | Attending: Emergency Medicine | Admitting: Emergency Medicine

## 2020-09-28 DIAGNOSIS — Z7951 Long term (current) use of inhaled steroids: Secondary | ICD-10-CM | POA: Diagnosis not present

## 2020-09-28 DIAGNOSIS — R040 Epistaxis: Secondary | ICD-10-CM | POA: Diagnosis not present

## 2020-09-28 DIAGNOSIS — Z8614 Personal history of Methicillin resistant Staphylococcus aureus infection: Secondary | ICD-10-CM | POA: Diagnosis not present

## 2020-09-28 DIAGNOSIS — I10 Essential (primary) hypertension: Secondary | ICD-10-CM | POA: Diagnosis not present

## 2020-09-28 DIAGNOSIS — Z955 Presence of coronary angioplasty implant and graft: Secondary | ICD-10-CM | POA: Diagnosis not present

## 2020-09-28 DIAGNOSIS — Z79899 Other long term (current) drug therapy: Secondary | ICD-10-CM | POA: Insufficient documentation

## 2020-09-28 DIAGNOSIS — J449 Chronic obstructive pulmonary disease, unspecified: Secondary | ICD-10-CM | POA: Insufficient documentation

## 2020-09-28 DIAGNOSIS — J45909 Unspecified asthma, uncomplicated: Secondary | ICD-10-CM | POA: Diagnosis not present

## 2020-09-28 DIAGNOSIS — J3489 Other specified disorders of nose and nasal sinuses: Secondary | ICD-10-CM | POA: Diagnosis not present

## 2020-09-28 DIAGNOSIS — Z7901 Long term (current) use of anticoagulants: Secondary | ICD-10-CM | POA: Diagnosis not present

## 2020-09-28 DIAGNOSIS — Z87891 Personal history of nicotine dependence: Secondary | ICD-10-CM | POA: Diagnosis not present

## 2020-09-28 DIAGNOSIS — Z85828 Personal history of other malignant neoplasm of skin: Secondary | ICD-10-CM | POA: Diagnosis not present

## 2020-09-28 LAB — PROTIME-INR
INR: 1.7 — ABNORMAL HIGH (ref 0.8–1.2)
Prothrombin Time: 19.8 seconds — ABNORMAL HIGH (ref 11.4–15.2)

## 2020-09-28 LAB — CBC
HCT: 45.3 % (ref 39.0–52.0)
Hemoglobin: 15.5 g/dL (ref 13.0–17.0)
MCH: 32.6 pg (ref 26.0–34.0)
MCHC: 34.2 g/dL (ref 30.0–36.0)
MCV: 95.2 fL (ref 80.0–100.0)
Platelets: 168 10*3/uL (ref 150–400)
RBC: 4.76 MIL/uL (ref 4.22–5.81)
RDW: 14.1 % (ref 11.5–15.5)
WBC: 7.1 10*3/uL (ref 4.0–10.5)
nRBC: 0 % (ref 0.0–0.2)

## 2020-09-28 MED ORDER — OXYMETAZOLINE HCL 0.05 % NA SOLN
1.0000 | Freq: Once | NASAL | Status: AC
  Administered 2020-09-28: 1 via NASAL
  Filled 2020-09-28: qty 30

## 2020-09-28 MED ORDER — LIDOCAINE VISCOUS HCL 2 % MT SOLN
15.0000 mL | Freq: Once | OROMUCOSAL | Status: AC
  Administered 2020-09-28: 15 mL via OROMUCOSAL
  Filled 2020-09-28: qty 15

## 2020-09-28 NOTE — ED Triage Notes (Signed)
Pt has nosebleed from right nares x 2 hours.  Pt on blood thinners.  Pt was doing a sinus flush and pt states I think I blew to hard.  Nasal clip on nose placed in triage.  Pt alert.

## 2020-09-28 NOTE — ED Provider Notes (Signed)
[facility] Emergency Department Provider Note   ____________________________________________   Event Date/Time   First MD Initiated Contact with Patient 09/28/20 2307     (approximate)  I have reviewed the triage vital signs and the nursing notes.   HISTORY  Chief Complaint Epistaxis   HPI [name] is a 80 y.o. male who presents to the ED ED from home with a chief complaint of right-sided nosebleed.  Patient takes Xarelto.  Prior history of sinus surgery and performs daily nasal rinses.  States he thinks he blew his nose too hard tonight and suffered a right-sided nosebleed.  Patient denies prior history of nosebleeds.  Denies headache, vision changes, chest pain, shortness of breath, abdominal pain, nausea, vomiting or dizziness.     Past Medical History:  Diagnosis Date  . AB (asthmatic bronchitis)   . Allergic rhinitis   . Arthropathy   . Asthma   . Cellulitis   . DVT (deep venous thrombosis) ([place])   . Foreign body in skin   . GERD (gastroesophageal reflux disease)   . Glaucoma   . Hx MRSA infection   . Hx of basal cell carcinoma 02/26/2018   L lat mid back  . Hypertension    Resolved - no meds  . Insomnia   . Osteoarthrosis   . Upper back strain     Patient Active Problem List   Diagnosis Date Noted  . BPH associated with nocturia 01/15/2018  . SOB (shortness of breath) 08/13/2017  . Sepsis ([place]) 11/05/2016  . Diverticulitis 11/05/2016  . History of deep vein thrombosis (DVT) of lower extremity 11/05/2016  . HCAP (healthcare-associated pneumonia) 08/29/2016  . NSTEMI (non-ST elevated myocardial infarction) ([place]) 08/18/2016  . Chronic obstructive pulmonary disease ([place]) 01/02/2016  . DVT, femoral, acute ([place]) 05/15/2015  . Recurrent pneumonia 12/21/2014  . Basal cell carcinoma 10/19/2014  . Abnormal liver enzymes 10/19/2014  . Personal history of methicillin resistant Staphylococcus aureus 10/19/2014  . Cannot sleep  10/19/2014  . Heel pain 10/19/2014  . Pain in shoulder 10/19/2014  . Back strain 10/19/2014  . Pulmonary aspergillosis, allergic bronchopulmonary type ([place]) 07/30/2014  . ABPA (allergic bronchopulmonary aspergillosis) ([place]) 07/30/2014  . CAP (community acquired pneumonia) 05/03/2014  . Abnormal CXR 04/01/2014  . Paroxysmal tachycardia ([place]) 06/23/2013  . Asymptomatic PVCs 06/23/2013  . Beat, premature ventricular 06/23/2013  . Cough 10/19/2012  . Allergic rhinitis 01/17/2009  . Asthma, chronic obstructive, without status asthmaticus ([place]) 11/04/2008  . CAFL (chronic airflow limitation) ([place]) 11/04/2008  . Essential hypertension 02/09/2008  . Arthropathia 08/28/2007  . Disorder of mitral valve 07/08/2005  . Benign prostatic hyperplasia without urinary obstruction 05/16/1997  . LBP (low back pain) 05/16/1997  . Degenerative arthritis of hip 03/18/1995    Past Surgical History:  Procedure Laterality Date  . APPENDECTOMY    . BRONCHOSCOPY    . CATARACT EXTRACTION W/PHACO Left 09/01/2019   Procedure: CATARACT EXTRACTION PHACO AND INTRAOCULAR LENS PLACEMENT ([facility]) LEFT 11.72 01:12.2 16.2%;  Surgeon: Leandrew Koyanagi, MD;  Location: [facility];  Service: Ophthalmology;  Laterality: Left;  . CATARACT EXTRACTION W/PHACO Right 09/22/2019   Procedure: CATARACT EXTRACTION PHACO AND INTRAOCULAR LENS PLACEMENT ([place]) RIGHT;  Surgeon: Leandrew Koyanagi, MD;  Location: [facility];  Service: Ophthalmology;  Laterality: Right;  6.88 103.4 10.9%  . LEFT HEART CATH AND CORONARY ANGIOGRAPHY N/A 08/19/2016   Procedure: Left Heart Cath and Coronary Angiography;  Surgeon: Dionisio David, MD;  Location: [facility];  Service: Cardiovascular;  Laterality: N/A;  . NASAL SINUS SURGERY  09/27/2014    Prior to Admission medications   Medication Sig Start Date End Date Taking? Authorizing Provider  ADVAIR HFA 230-21 MCG/ACT inhaler Inhale 2 puffs into the lungs 2 (two) times  daily.  11/17/15   [provider]  albuterol (PROVENTIL HFA;VENTOLIN HFA) 108 (90 Base) MCG/ACT inhaler Inhale into the lungs every 6 (six) hours as needed for wheezing or shortness of breath.     [provider]  brimonidine (ALPHAGAN) 0.15 % ophthalmic solution Place 1 drop into both eyes 2 (two) times daily.  10/26/15   [provider]  diltiazem (CARDIZEM CD) 180 MG 24 hr capsule TAKE 1 CAPSULE EVERY DAY 09/12/20   Jerrol Banana., MD  dorzolamide (TRUSOPT) 2 % ophthalmic solution Place 1 drop into both eyes 2 (two) times daily.  08/31/15   [provider]  finasteride (PROSCAR) 5 MG tablet TAKE ONE TABLET BY MOUTH EVERY DAY 08/30/20   Jerrol Banana., MD  fluticasone [facility]) 50 MCG/ACT nasal spray Place 2 sprays into both nostrils 2 (two) times daily.  06/29/18   [provider]  ketoconazole (NIZORAL) 2 % cream Apply 1 application topically at bedtime. qhs to feet and in between toes 09/14/20   Ralene Bathe, MD  ketoconazole (NIZORAL) 2 % shampoo Apply 1 application topically 3 (three) times a week. Wash trunk, arms, scalp 3 times a week for 6 weeks, then once a month for maintenance.  Apply to affected areas let sit 5 minutes and rinse off in shower 09/15/20   Ralene Bathe, MD  latanoprost (XALATAN) 0.005 % ophthalmic solution Place 1 drop into both eyes at bedtime.     [provider]  Melatonin 5 MG TABS Take 1 tablet by mouth at bedtime.     [provider]  montelukast (SINGULAIR) 10 MG tablet Take 1 tablet (10 mg total) by mouth at bedtime. 08/11/19   Jerrol Banana., MD  omeprazole (PRILOSEC) 20 MG capsule TAKE 1 CAPSULE BY MOUTH DAILY 11/20/19   Jerrol Banana., MD  tamsulosin [facility]) 0.4 MG CAPS capsule Take 1 capsule (0.4 mg total) by mouth daily. 02/15/20   Bjorn Loser, MD  tamsulosin (FLOMAX) 0.4 MG CAPS capsule Take 2 capsules (0.8 mg total) by mouth daily. 05/22/20   Bjorn Loser, MD   tiotropium (SPIRIVA HANDIHALER) 18 MCG inhalation capsule Place 18 mcg into inhaler and inhale daily.  02/06/15 12/22/19  [provider]  triamcinolone (KENALOG) 0.1 % Apply 1 application topically 2 (two) times daily as needed. 05/24/20   Brendolyn Patty, MD  triamcinolone ointment (KENALOG) 0.1 % Apply 1 application topically as directed. To cracks on fingers as needed 08/06/18   [provider]  XARELTO 10 MG TABS tablet Take 10 mg by mouth daily.  08/05/16   [provider]    Allergies Timolol maleate [timolol maleate], Atorvastatin, Bactrim [sulfamethoxazole-trimethoprim], Budesonide-formoterol fumarate, and Lisinopril  Family History  Problem Relation Age of Onset  . Breast cancer Sister   . Heart failure Mother   . Pancreatic cancer Sister   . Alzheimer's disease Father   . Brain cancer Daughter     Social History Social History   Tobacco Use  . Smoking status: Former Smoker    Packs/day: 0.50    Years: 3.00    Pack years: 1.50    Types: Cigarettes    Quit date: 06/17/1958    Years  since quitting: 62.3  . Smokeless tobacco: Never Used  . Tobacco comment: smoking while in college  Vaping Use  . Vaping Use: Never used  Substance Use Topics  . Alcohol use: Yes    Alcohol/week: 3.0 - 4.0 standard drinks    Types: 3 - 4 Glasses of wine per week  . Drug use: No    Review of Systems  Constitutional: No fever/chills Eyes: No visual changes. ENT: Positive for right-sided nosebleed.  No sore throat. Cardiovascular: Denies chest pain. Respiratory: Denies shortness of breath. Gastrointestinal: No abdominal pain.  No nausea, no vomiting.  No diarrhea.  No constipation. Genitourinary: Negative for dysuria. Musculoskeletal: Negative for back pain. Skin: Negative for rash. Neurological: Negative for headaches, focal weakness or numbness.   ____________________________________________   PHYSICAL EXAM:  VITAL SIGNS: ED Triage Vitals  Enc Vitals  Group     BP 09/28/20 2226 (!) 157/78     Pulse Rate 09/28/20 2226 95     Resp 09/28/20 2226 20     Temp 09/28/20 2226 98.4 F (36.9 C)     Temp Source 09/28/20 2226 Oral     SpO2 09/28/20 2226 95 %     Weight 09/28/20 2227 165 lb (74.8 kg)     Height 09/28/20 2227 6\' 2"  (1.88 m)     Head Circumference --      Peak Flow --      Pain Score 09/28/20 2227 0     Pain Loc --      Pain Edu? --      Excl. in [place]? --     Constitutional: Alert and oriented. Well appearing and in mild acute distress. Eyes: Conjunctivae are normal. PERRL. EOMI. Head: Atraumatic. Nose: Bleeding from right naris. Mouth/Throat: Mucous membranes are moist.  Mild blood in posterior oropharynx.  Neck: No stridor.   Cardiovascular: Normal rate, regular rhythm. Grossly normal heart sounds.  Good peripheral circulation. Respiratory: Normal respiratory effort.  No retractions. Lungs CTAB. Gastrointestinal: Soft and nontender. No distention. No abdominal bruits. No CVA tenderness. Musculoskeletal: No lower extremity tenderness nor edema.  No joint effusions. Neurologic:  Normal speech and language. No gross focal neurologic deficits are appreciated. No gait instability. Skin:  Skin is warm, dry and intact. No rash noted. Psychiatric: Mood and affect are normal. Speech and behavior are normal.  ____________________________________________   LABS (all labs ordered are listed, but only abnormal results are displayed)  Labs Reviewed  PROTIME-INR - Abnormal; Notable for the following components:      Result Value   Prothrombin Time 19.8 (*)    INR 1.7 (*)    All other components within normal limits  CBC   ____________________________________________  EKG  None ____________________________________________  RADIOLOGY I, Aliya Sol J, personally viewed and evaluated these images (plain radiographs) as part of my medical decision making, as well as reviewing the written report by the radiologist.  ED MD  interpretation: None  Official radiology report(s): No results found.  ____________________________________________   PROCEDURES  Procedure(s) performed (including Critical Care):  .Epistaxis Management  Date/Time: 09/29/2020 12:25 AM Performed by: Paulette Blanch, MD Authorized by: Paulette Blanch, MD   Consent:    Consent obtained:  Verbal   Consent given by:  Patient   Risks, benefits, and alternatives were discussed: yes     Risks discussed:  Bleeding, infection, nasal injury and pain Procedure details:    Treatment site:  R anterior   Treatment method:  Nasal balloon (Rhinorocket)  Treatment complexity:  Limited   Treatment episode: initial   Post-procedure details:    Assessment:  Bleeding stopped   Procedure completion:  Tolerated well, no immediate complications  Nasal packing performed at 2339   ____________________________________________   INITIAL IMPRESSION / ASSESSMENT AND PLAN / ED COURSE  As part of my medical decision making, I reviewed the following data within the [facility] notes reviewed and incorporated, Labs reviewed, Old chart reviewed and Notes from prior ED visits     80 year old male on Xarelto presenting with right-sided nosebleed.  H/H unremarkable.  INR 1.7.  Clinical Course as of 09/29/20 0047  Thu Sep 28, 2020  2339 Rhino Rocket applied to right naris.  Will observe. [JS]  Fri Sep 29, 2020  0032 Bleeding completely resolved.  No bleeding in posterior oropharynx.  Patient eager for discharge.  Advised patient to hold his Xarelto x3 days, then resume.  Will place on Keflex and patient will follow up with ENT in 5 days.  Strict return precautions given.  Patient and spouse verbalized understanding agree with plan of care. [JS]    Clinical Course User Index [JS] Paulette Blanch, MD     ____________________________________________   FINAL CLINICAL IMPRESSION(S) / ED DIAGNOSES  Final diagnoses:  Right-sided  epistaxis     ED Discharge Orders    None      *Please note:  ARY LAVINE was evaluated in Emergency Department on 09/28/2020 for the symptoms described in the history of present illness. He was evaluated in the context of the global COVID-19 pandemic, which necessitated consideration that the patient might be at risk for infection with the SARS-CoV-2 virus that causes COVID-19. Institutional protocols and algorithms that pertain to the evaluation of patients at risk for COVID-19 are in a state of rapid change based on information released by regulatory bodies including the CDC and federal and state organizations. These policies and algorithms were followed during the patient's care in the ED.  Some ED evaluations and interventions may be delayed as a result of limited staffing during and the pandemic.*   Note:  This document was prepared using Dragon voice recognition software and may include unintentional dictation errors.   Paulette Blanch, MD 09/29/20 [phone]

## 2020-09-28 NOTE — ED Notes (Signed)
Rhino rocket placed by Dr. Beather Arbour. Suction remains at bedside. Pt tolerated well. Surgicel dressing applied below right nare. Ice applied over nose. Pt given ice water to rinse mouth

## 2020-09-29 ENCOUNTER — Other Ambulatory Visit: Payer: Self-pay

## 2020-09-29 ENCOUNTER — Encounter: Payer: Self-pay | Admitting: Emergency Medicine

## 2020-09-29 ENCOUNTER — Emergency Department
Admission: EM | Admit: 2020-09-29 | Discharge: 2020-09-29 | Disposition: A | Discharge status: Home / Self Care | Payer: PPO | Source: Home / Self Care | Attending: Emergency Medicine | Admitting: Emergency Medicine

## 2020-09-29 DIAGNOSIS — J3489 Other specified disorders of nose and nasal sinuses: Secondary | ICD-10-CM | POA: Diagnosis not present

## 2020-09-29 DIAGNOSIS — Z86718 Personal history of other venous thrombosis and embolism: Secondary | ICD-10-CM | POA: Insufficient documentation

## 2020-09-29 DIAGNOSIS — Z79899 Other long term (current) drug therapy: Secondary | ICD-10-CM | POA: Insufficient documentation

## 2020-09-29 DIAGNOSIS — Z85828 Personal history of other malignant neoplasm of skin: Secondary | ICD-10-CM | POA: Insufficient documentation

## 2020-09-29 DIAGNOSIS — Z87891 Personal history of nicotine dependence: Secondary | ICD-10-CM | POA: Insufficient documentation

## 2020-09-29 DIAGNOSIS — I10 Essential (primary) hypertension: Secondary | ICD-10-CM | POA: Insufficient documentation

## 2020-09-29 DIAGNOSIS — J45909 Unspecified asthma, uncomplicated: Secondary | ICD-10-CM | POA: Insufficient documentation

## 2020-09-29 MED ORDER — CEPHALEXIN 500 MG PO CAPS
500.0000 mg | ORAL_CAPSULE | Freq: Once | ORAL | Status: AC
  Administered 2020-09-29: 500 mg via ORAL
  Filled 2020-09-29: qty 1

## 2020-09-29 MED ORDER — ACETAMINOPHEN 500 MG PO TABS
1000.0000 mg | ORAL_TABLET | Freq: Once | ORAL | Status: AC
  Administered 2020-09-29: 1000 mg via ORAL
  Filled 2020-09-29: qty 2

## 2020-09-29 MED ORDER — HYDROCODONE-ACETAMINOPHEN 5-325 MG PO TABS: 1.0000 | ORAL_TABLET | ORAL | 0 refills | Status: DC | PRN

## 2020-09-29 MED ORDER — CEPHALEXIN 500 MG PO CAPS: 500.0000 mg | ORAL_CAPSULE | Freq: Three times a day (TID) | ORAL | 0 refills | Status: DC

## 2020-09-29 MED ORDER — IBUPROFEN 600 MG PO TABS
600.0000 mg | ORAL_TABLET | Freq: Once | ORAL | Status: AC
  Administered 2020-09-29: 600 mg via ORAL
  Filled 2020-09-29: qty 1

## 2020-09-29 NOTE — Discharge Instructions (Addendum)
1.  Take antibiotic as prescribed (Keflex 500mg  three times daily x 7 days) 2.  Keep nasal tampon in place until seen by the ENT specialist. 3.  Return to the ER for worsening symptoms, persistent vomiting, difficulty breathing or other concerns.

## 2020-09-29 NOTE — ED Triage Notes (Signed)
Pt comes into the ED via POV c/o uncontrolled pain.  Pt was seen last night for epistaxis where his right nare was packed.  Pt states that since the nare was packed, he has had uncontrolled pain and discomfort.  Pt currently ambulatory to exam room with even and unlabored respirations.  PT in NAD.  All bleeding appears to be dried blood around the nare at this time.

## 2020-09-29 NOTE — ED Notes (Signed)
Pt reports not longer feeling bleeding down back of throat, reports he "thinks the bleeding stopped and is ready to go home." ED provider notified.

## 2020-09-29 NOTE — ED Provider Notes (Signed)
[facility] Emergency Department Provider Note  ____________________________________________  Time seen: Approximately 4:20 PM  I have reviewed the triage vital signs and the nursing notes.   HISTORY  Chief Complaint Pain    HPI [name] is a 80 y.o. male who presents emergency department requesting advice on medications he can use to help alleviate some of the pressure from his nasal packing.  Patient had a nosebleed that would not stop last night, he was seen in the emergency department, given a Rhino Rocket and placed on antibiotics.  Patient states that he has had a good cessation of his bleeding but the pressure from the [employer] is making him somewhat measurable.  Patient denied any sharp pain.  He states that it is just a pressure, fullness sensation like somebody "stuck a finger of my nose."  Patient is aware that the [employer] cannot come out and he is asking for advice on medications as he states "I have some medicines can act like a blood thinner which is the last thing I want to do."  No difficulty breathing.  No ongoing epistaxis.         Past Medical History:  Diagnosis Date  . AB (asthmatic bronchitis)   . Allergic rhinitis   . Arthropathy   . Asthma   . Cellulitis   . DVT (deep venous thrombosis) ([place])   . Foreign body in skin   . GERD (gastroesophageal reflux disease)   . Glaucoma   . Hx MRSA infection   . Hx of basal cell carcinoma 02/26/2018   L lat mid back  . Hypertension    Resolved - no meds  . Insomnia   . Osteoarthrosis   . Upper back strain     Patient Active Problem List   Diagnosis Date Noted  . BPH associated with nocturia 01/15/2018  . SOB (shortness of breath) 08/13/2017  . Sepsis ([place]) 11/05/2016  . Diverticulitis 11/05/2016  . History of deep vein thrombosis (DVT) of lower extremity 11/05/2016  . HCAP (healthcare-associated pneumonia) 08/29/2016  . NSTEMI (non-ST elevated myocardial infarction) ([place])  08/18/2016  . Chronic obstructive pulmonary disease ([place]) 01/02/2016  . DVT, femoral, acute ([place]) 05/15/2015  . Recurrent pneumonia 12/21/2014  . Basal cell carcinoma 10/19/2014  . Abnormal liver enzymes 10/19/2014  . Personal history of methicillin resistant Staphylococcus aureus 10/19/2014  . Cannot sleep 10/19/2014  . Heel pain 10/19/2014  . Pain in shoulder 10/19/2014  . Back strain 10/19/2014  . Pulmonary aspergillosis, allergic bronchopulmonary type ([place]) 07/30/2014  . ABPA (allergic bronchopulmonary aspergillosis) ([place]) 07/30/2014  . CAP (community acquired pneumonia) 05/03/2014  . Abnormal CXR 04/01/2014  . Paroxysmal tachycardia ([place]) 06/23/2013  . Asymptomatic PVCs 06/23/2013  . Beat, premature ventricular 06/23/2013  . Cough 10/19/2012  . Allergic rhinitis 01/17/2009  . Asthma, chronic obstructive, without status asthmaticus ([place]) 11/04/2008  . CAFL (chronic airflow limitation) ([place]) 11/04/2008  . Essential hypertension 02/09/2008  . Arthropathia 08/28/2007  . Disorder of mitral valve 07/08/2005  . Benign prostatic hyperplasia without urinary obstruction 05/16/1997  . LBP (low back pain) 05/16/1997  . Degenerative arthritis of hip 03/18/1995    Past Surgical History:  Procedure Laterality Date  . APPENDECTOMY    . BRONCHOSCOPY    . CATARACT EXTRACTION W/PHACO Left 09/01/2019   Procedure: CATARACT EXTRACTION PHACO AND INTRAOCULAR LENS PLACEMENT (IOC) LEFT 11.72 01:12.2 16.2%;  Surgeon: Leandrew Koyanagi, MD;  Location: [place];  Service: Ophthalmology;  Laterality: Left;  . CATARACT  EXTRACTION W/PHACO Right 09/22/2019   Procedure: CATARACT EXTRACTION PHACO AND INTRAOCULAR LENS PLACEMENT ([place]) RIGHT;  Surgeon: Leandrew Koyanagi, MD;  Location: [place];  Service: Ophthalmology;  Laterality: Right;  6.88 103.4 10.9%  . LEFT HEART CATH AND CORONARY ANGIOGRAPHY N/A 08/19/2016   Procedure: Left Heart Cath and Coronary Angiography;  Surgeon:  Dionisio David, MD;  Location: [place] [facility];  Service: Cardiovascular;  Laterality: N/A;  . NASAL SINUS SURGERY  09/27/2014    Prior to Admission medications   Medication Sig Start Date End Date Taking? Authorizing Provider  HYDROcodone-acetaminophen (NORCO/VICODIN) 5-325 MG tablet Take 1 tablet by mouth every 4 (four) hours as needed for moderate pain. 09/29/20 09/29/21 Yes Jaxton Casale, Charline Bills, PA-C  ADVAIR HFA 230-21 MCG/ACT inhaler Inhale 2 puffs into the lungs 2 (two) times daily.  11/17/15   [provider]  albuterol (PROVENTIL HFA;VENTOLIN HFA) 108 (90 Base) MCG/ACT inhaler Inhale into the lungs every 6 (six) hours as needed for wheezing or shortness of breath.     [provider]  brimonidine (ALPHAGAN) 0.15 % ophthalmic solution Place 1 drop into both eyes 2 (two) times daily.  10/26/15   [provider]  cephALEXin (KEFLEX) 500 MG capsule Take 1 capsule (500 mg total) by mouth 3 (three) times daily. 09/29/20   Paulette Blanch, MD  diltiazem (CARDIZEM CD) 180 MG 24 hr capsule TAKE 1 CAPSULE EVERY DAY 09/12/20   Jerrol Banana., MD  dorzolamide (TRUSOPT) 2 % ophthalmic solution Place 1 drop into both eyes 2 (two) times daily.  08/31/15   [provider]  finasteride (PROSCAR) 5 MG tablet TAKE ONE TABLET BY MOUTH EVERY DAY 08/30/20   Jerrol Banana., MD  fluticasone [facility]) 50 MCG/ACT nasal spray Place 2 sprays into both nostrils 2 (two) times daily.  06/29/18   [provider]  ketoconazole (NIZORAL) 2 % cream Apply 1 application topically at bedtime. qhs to feet and in between toes 09/14/20   Ralene Bathe, MD  ketoconazole (NIZORAL) 2 % shampoo Apply 1 application topically 3 (three) times a week. Wash trunk, arms, scalp 3 times a week for 6 weeks, then once a month for maintenance.  Apply to affected areas let sit 5 minutes and rinse off in shower 09/15/20   Ralene Bathe, MD  latanoprost (XALATAN) 0.005 % ophthalmic solution  Place 1 drop into both eyes at bedtime.     [provider]  Melatonin 5 MG TABS Take 1 tablet by mouth at bedtime.     [provider]  montelukast (SINGULAIR) 10 MG tablet Take 1 tablet (10 mg total) by mouth at bedtime. 08/11/19   Jerrol Banana., MD  omeprazole (PRILOSEC) 20 MG capsule TAKE 1 CAPSULE BY MOUTH DAILY 11/20/19   Jerrol Banana., MD  tamsulosin [facility]) 0.4 MG CAPS capsule Take 1 capsule (0.4 mg total) by mouth daily. 02/15/20   Bjorn Loser, MD  tamsulosin (FLOMAX) 0.4 MG CAPS capsule Take 2 capsules (0.8 mg total) by mouth daily. 05/22/20   Bjorn Loser, MD  tiotropium (SPIRIVA HANDIHALER) 18 MCG inhalation capsule Place 18 mcg into inhaler and inhale daily.  02/06/15 12/22/19  [provider]  triamcinolone (KENALOG) 0.1 % Apply 1 application topically 2 (two) times daily as needed. 05/24/20   Brendolyn Patty, MD  triamcinolone ointment (KENALOG) 0.1 % Apply 1 application topically as directed. To cracks on fingers as needed 08/06/18   [provider]  XARELTO 10 MG TABS tablet Take 10 mg by mouth daily.  08/05/16   [provider]    Allergies Timolol maleate [timolol maleate], Atorvastatin, Bactrim [sulfamethoxazole-trimethoprim], Budesonide-formoterol fumarate, and Lisinopril  Family History  Problem Relation Age of Onset  . Breast cancer Sister   . Heart failure Mother   . Pancreatic cancer Sister   . Alzheimer's disease Father   . Brain cancer Daughter     Social History Social History   Tobacco Use  . Smoking status: Former Smoker    Packs/day: 0.50    Years: 3.00    Pack years: 1.50    Types: Cigarettes    Quit date: 06/17/1958    Years since quitting: 62.3  . Smokeless tobacco: Never Used  . Tobacco comment: smoking while in college  Vaping Use  . Vaping Use: Never used  Substance Use Topics  . Alcohol use: Yes    Alcohol/week: 3.0 - 4.0 standard drinks    Types: 3 - 4 Glasses of wine per week   . Drug use: No     Review of Systems  Constitutional: No fever/chills Eyes: No visual changes. No discharge ENT: Epistaxis last night with Rhino Rocket placed in the right nares.  Good cessation of bleeding, pressure sensation to the right nares Cardiovascular: no chest pain. Respiratory: no cough. No SOB. Gastrointestinal: No abdominal pain.  No nausea, no vomiting.  No diarrhea.  No constipation. Musculoskeletal: Negative for musculoskeletal pain. Skin: Negative for rash, abrasions, lacerations, ecchymosis. Neurological: Negative for headaches, focal weakness or numbness.  10 System ROS otherwise negative.  ____________________________________________   PHYSICAL EXAM:  VITAL SIGNS: ED Triage Vitals [09/29/20 1615]  Enc Vitals Group     BP (!) 145/72     Pulse Rate 97     Resp 19     Temp 98.4 F (36.9 C)     Temp Source Oral     SpO2 95 %     Weight 165 lb (74.8 kg)     Height 6\' 2"  (1.88 m)     Head Circumference      Peak Flow      Pain Score 8     Pain Loc      Pain Edu?      Excl. in [place]?      Constitutional: Alert and oriented. Well appearing and in no acute distress. Eyes: Conjunctivae are normal. PERRL. EOMI. Head: Atraumatic. ENT:      Ears:       Nose: Visualization of the right nares reveals an placed Rhino Rocket.  No epistaxis.  Blood is dried around the nares.  Patient is nontender to palpation along the nose or over the sinuses.      Mouth/Throat: Mucous membranes are moist.  Neck: No stridor.    Cardiovascular: Normal rate, regular rhythm. Normal S1 and S2.  Good peripheral circulation. Respiratory: Normal respiratory effort without tachypnea or retractions. Lungs CTAB. Good air entry to the bases with no decreased or absent breath sounds. Musculoskeletal: Full range of motion to all extremities. No gross deformities appreciated. Neurologic:  Normal speech and language. No gross focal neurologic deficits are appreciated.  Skin:  Skin is warm,  dry and intact. No rash noted. Psychiatric: Mood and affect are normal. Speech and behavior are normal. Patient exhibits appropriate insight and judgement.   ____________________________________________   LABS (all labs ordered are listed, but only abnormal results are displayed)  Labs Reviewed - No data to display ____________________________________________  EKG  ____________________________________________  RADIOLOGY   No results found.  ____________________________________________    PROCEDURES  Procedure(s) performed:    Procedures    Medications  acetaminophen (TYLENOL) tablet 1,000 mg (1,000 mg Oral Given 09/29/20 1712)  ibuprofen (ADVIL) tablet 600 mg (600 mg Oral Given 09/29/20 1712)     ____________________________________________   INITIAL IMPRESSION / ASSESSMENT AND PLAN / ED COURSE  Pertinent labs & imaging results that were available during my care of the patient were reviewed by me and considered in my medical decision making (see chart for details).  Review of the [place] CSRS was performed in accordance of the [place] prior to dispensing any controlled drugs.           Patient's diagnosis is consistent with nasal pain.  Patient presented to emergency department complaining of a lateral pressure to the right nares.  Patient had epistaxis that required packing last night.  The packing is causing a pressure sensation to the right nose.  Patient has had no ongoing bleeding.  Patient is here primarily looking for recommendations to assist with his pain.  There is no concerning amounts or location of pain to be concern for further trauma from [facility] placement.  Patient is just complaining of a lateral pressure from the [employer] itself.  He was somewhat scared to take over-the-counter medications as he knew that some over-the-counter medications could make bleeding more severe.  At this time we will try combination of Tylenol and Motrin.  Patient is  80 years old but has excellent kidney function with GFR in the 80s from his last lab work.  I will also prescribe low-dose pain medication if the over-the-counter medications are not working.  He is to follow-up with ENT.  He is on antibiotics currently from having the nasal packing placed..  Patient is given ED precautions to return to the ED for any worsening or new symptoms.     ____________________________________________  FINAL CLINICAL IMPRESSION(S) / ED DIAGNOSES  Final diagnoses:  Nasal pain      NEW MEDICATIONS STARTED DURING THIS VISIT:  ED Discharge Orders         Ordered    HYDROcodone-acetaminophen (NORCO/VICODIN) 5-325 MG tablet  Every 4 hours PRN        09/29/20 1741              This chart was dictated using voice recognition software/Dragon. Despite best efforts to proofread, errors can occur which can change the meaning. Any change was purely unintentional.    Darletta Moll, PA-C 09/29/20 1741    Nance Pear, MD 09/29/20 Kathyrn Drown

## 2020-09-29 NOTE — ED Notes (Signed)
See triage note   States he is here for nose pain   States he had his nose packed last pm

## 2020-10-03 DIAGNOSIS — R040 Epistaxis: Secondary | ICD-10-CM | POA: Diagnosis not present

## 2020-10-11 DIAGNOSIS — J331 Polypoid sinus degeneration: Secondary | ICD-10-CM | POA: Diagnosis not present

## 2020-10-11 DIAGNOSIS — R040 Epistaxis: Secondary | ICD-10-CM | POA: Diagnosis not present

## 2020-10-11 DIAGNOSIS — J301 Allergic rhinitis due to pollen: Secondary | ICD-10-CM | POA: Diagnosis not present

## 2020-10-16 ENCOUNTER — Other Ambulatory Visit: Payer: Self-pay

## 2020-11-08 DIAGNOSIS — Z09 Encounter for follow-up examination after completed treatment for conditions other than malignant neoplasm: Secondary | ICD-10-CM | POA: Diagnosis not present

## 2020-11-08 DIAGNOSIS — Z8709 Personal history of other diseases of the respiratory system: Secondary | ICD-10-CM | POA: Diagnosis not present

## 2020-11-10 DIAGNOSIS — J331 Polypoid sinus degeneration: Secondary | ICD-10-CM | POA: Diagnosis not present

## 2020-11-10 DIAGNOSIS — J0101 Acute recurrent maxillary sinusitis: Secondary | ICD-10-CM | POA: Diagnosis not present

## 2020-11-10 DIAGNOSIS — R040 Epistaxis: Secondary | ICD-10-CM | POA: Diagnosis not present

## 2020-11-23 DIAGNOSIS — I82419 Acute embolism and thrombosis of unspecified femoral vein: Secondary | ICD-10-CM | POA: Diagnosis not present

## 2020-11-23 DIAGNOSIS — I25119 Atherosclerotic heart disease of native coronary artery with unspecified angina pectoris: Secondary | ICD-10-CM | POA: Diagnosis not present

## 2020-11-23 DIAGNOSIS — I479 Paroxysmal tachycardia, unspecified: Secondary | ICD-10-CM | POA: Diagnosis not present

## 2020-11-23 DIAGNOSIS — J411 Mucopurulent chronic bronchitis: Secondary | ICD-10-CM | POA: Diagnosis not present

## 2020-11-29 ENCOUNTER — Other Ambulatory Visit: Payer: Self-pay | Admitting: Family Medicine

## 2020-11-29 DIAGNOSIS — J324 Chronic pansinusitis: Secondary | ICD-10-CM | POA: Diagnosis not present

## 2020-11-29 DIAGNOSIS — N401 Enlarged prostate with lower urinary tract symptoms: Secondary | ICD-10-CM

## 2020-12-05 ENCOUNTER — Ambulatory Visit: Payer: Self-pay | Admitting: *Deleted

## 2020-12-05 NOTE — Telephone Encounter (Signed)
Is it ok for patient to wait to be seen based off of symptoms below? Please advise. Thanks!

## 2020-12-05 NOTE — Telephone Encounter (Signed)
Advised patient as below. Patient reports that he will keep July appt.

## 2020-12-05 NOTE — Telephone Encounter (Signed)
Reason for Disposition  [1] MODERATE pain (e.g., interferes with normal activities) AND [2] pain comes and goes (cramps) AND [3] present > 24 hours  (Exception: pain with Vomiting or Diarrhea - see that Guideline)  Answer Assessment - Initial Assessment Questions 1. LOCATION: "Where does it hurt?"      I'm having soreness for the last 6 months.   The last couple of days when I cough it really hurts.   It's at the belt line right to the center by 4-5 inches.    2. RADIATION: "Does the pain shoot anywhere else?" (e.g., chest, back)     I can feel it toward right side of my belt line. 3. ONSET: "When did the pain begin?" (Minutes, hours or days ago)      Gradually but coughing or sneezing makes it worse.   4. SUDDEN: "Gradual or sudden onset?"     Gradually 5. PATTERN "Does the pain come and go, or is it constant?"    - If constant: "Is it getting better, staying the same, or worsening?"      (Note: Constant means the pain never goes away completely; most serious pain is constant and it progresses)     - If intermittent: "How long does it last?" "Do you have pain now?"     (Note: Intermittent means the pain goes away completely between bouts)     Sore all the time 6. SEVERITY: "How bad is the pain?"  (e.g., Scale 1-10; mild, moderate, or severe)    - MILD (1-3): doesn't interfere with normal activities, abdomen soft and not tender to touch     - MODERATE (4-7): interferes with normal activities or awakens from sleep, abdomen tender to touch     - SEVERE (8-10): excruciating pain, doubled over, unable to do any normal activities       2 when not coughing/sneezing.    If cough it's a 10 7. RECURRENT SYMPTOM: "Have you ever had this type of stomach pain before?" If Yes, ask: "When was the last time?" and "What happened that time?"      No 8. CAUSE: "What do you think is causing the stomach pain?"     I don't know.   No injuries or falls or recent abd surgeries.   I had appendectomy when I was 80  yrs old. 9. RELIEVING/AGGRAVATING FACTORS: "What makes it better or worse?" (e.g., movement, antacids, bowel movement)     Coughing and sneezing  10. OTHER SYMPTOMS: "Do you have any other symptoms?" (e.g., back pain, diarrhea, fever, urination pain, vomiting)       No nausea or diarrhea.  Protocols used: Abdominal Pain - Male-A-AH

## 2020-12-05 NOTE — Telephone Encounter (Signed)
Pt called in c/o having abd soreness for the last 6 months at the belt line about 4-5 inches from the center of his abd.   It is sore all the time but when he coughs or sneezes it really hurts bad.   "It causes me to yell out".     Denies injuries, falls, or recent surgeries.   Had an appendectomy at 80 yrs old.   No nausea, vomiting or diarrhea.   Denies feeling any knots or lumps in that area of abd.  I made him an appt with Dr. Rosanna Randy for 12/21/2020 at 2:40 in office visit.   I'm sending a high priority note to see if he can be worked in sooner.   Protocol is to be seen within 24 hrs.   He was agreeable with this plan.

## 2020-12-11 ENCOUNTER — Other Ambulatory Visit: Payer: Self-pay | Admitting: Family Medicine

## 2020-12-11 DIAGNOSIS — I479 Paroxysmal tachycardia, unspecified: Secondary | ICD-10-CM

## 2020-12-11 NOTE — Telephone Encounter (Signed)
   Notes to clinic:  patient has appt on 12/21/2020 Review for a short supply until that time   Requested Prescriptions  Pending Prescriptions Disp Refills   diltiazem (CARDIZEM CD) 180 MG 24 hr capsule [Pharmacy Med Name: DILTIAZEM HCL ER COATED BEADS 180 M] 90 capsule 1    Sig: TAKE 1 CAPSULE EVERY DAY      Cardiovascular:  Calcium Channel Blockers Failed - 12/11/2020 11:28 AM      Failed - Last BP in normal range    BP Readings from Last 1 Encounters:  09/29/20 (!) 145/72          Failed - Valid encounter within last 6 months    Recent Outpatient Visits           7 months ago Essential hypertension   [facility] Jerrol Banana., MD   11 months ago Annual physical exam   [facility] Jerrol Banana., MD   12 months ago Hip pain, bilateral   [facility] Jerrol Banana., MD   1 year ago CAFL (chronic airflow limitation) [facility])   [facility] Jerrol Banana., MD   1 year ago Low back pain without sciatica, unspecified back pain laterality, unspecified chronicity   [facility] Jerrol Banana., MD       Future Appointments             In 1 week Jerrol Banana., MD [facility]   In 3 weeks Jerrol Banana., MD [facility], [facility]   In 5 months [facility], Nicki Reaper, [place]

## 2020-12-19 ENCOUNTER — Other Ambulatory Visit: Payer: Self-pay | Admitting: Dermatology

## 2020-12-19 DIAGNOSIS — B360 Pityriasis versicolor: Secondary | ICD-10-CM

## 2020-12-21 ENCOUNTER — Other Ambulatory Visit: Payer: Self-pay

## 2020-12-21 ENCOUNTER — Ambulatory Visit (INDEPENDENT_AMBULATORY_CARE_PROVIDER_SITE_OTHER): Payer: PPO | Admitting: Family Medicine

## 2020-12-21 ENCOUNTER — Encounter: Payer: Self-pay | Admitting: Family Medicine

## 2020-12-21 VITALS — BP 131/76 | HR 79 | Temp 98.4°F | Resp 16 | Ht 74.0 in | Wt 163.0 lb

## 2020-12-21 DIAGNOSIS — M545 Low back pain, unspecified: Secondary | ICD-10-CM

## 2020-12-21 DIAGNOSIS — S39011A Strain of muscle, fascia and tendon of abdomen, initial encounter: Secondary | ICD-10-CM

## 2020-12-21 MED ORDER — BACLOFEN 10 MG PO TABS: 10.0000 mg | ORAL_TABLET | Freq: Three times a day (TID) | ORAL | 0 refills | Status: DC

## 2020-12-21 MED ORDER — PREDNISONE 10 MG PO TABS: 10.0000 mg | ORAL_TABLET | Freq: Every day | ORAL | 1 refills | Status: DC

## 2020-12-21 NOTE — Progress Notes (Signed)
Established patient visit   Patient: [name]   DOB: 02-25-1941   80 y.o. Male  MRN: [id number] Visit Date: 12/21/2020  Today's healthcare provider: Wilhemena Durie, MD   Chief Complaint  Patient presents with   Abdominal Pain   Subjective    Abdominal Pain This is a new problem. The current episode started 1 to 4 weeks ago (3 weeks). The onset quality is gradual. The problem has been gradually worsening. The pain is located in the generalized abdominal region. The pain is moderate. The quality of the pain is aching and a sensation of fullness. Pertinent negatives include no constipation, diarrhea, fever, headaches, nausea or vomiting.   Patient complains of right lower back pain and right lower quadrant abdominal discomfort for the past 2 weeks.  He really noticed it after a day of pulling weeds in his yard.  He has no GI or GU symptoms.      Medications: Outpatient Medications Prior to Visit  Medication Sig   ADVAIR HFA 230-21 MCG/ACT inhaler Inhale 2 puffs into the lungs 2 (two) times daily.    albuterol (PROVENTIL HFA;VENTOLIN HFA) 108 (90 Base) MCG/ACT inhaler Inhale into the lungs every 6 (six) hours as needed for wheezing or shortness of breath.    brimonidine (ALPHAGAN) 0.15 % ophthalmic solution Place 1 drop into both eyes 2 (two) times daily.    diltiazem (CARDIZEM CD) 180 MG 24 hr capsule TAKE 1 CAPSULE EVERY DAY   cephALEXin (KEFLEX) 500 MG capsule Take 1 capsule (500 mg total) by mouth 3 (three) times daily.   dorzolamide (TRUSOPT) 2 % ophthalmic solution Place 1 drop into both eyes 2 (two) times daily.    finasteride (PROSCAR) 5 MG tablet TAKE ONE TABLET BY MOUTH EVERY DAY   fluticasone (FLONASE) 50 MCG/ACT nasal spray Place 2 sprays into both nostrils 2 (two) times daily.    HYDROcodone-acetaminophen (NORCO/VICODIN) 5-325 MG tablet Take 1 tablet by mouth every 4 (four) hours as needed for moderate pain.   ketoconazole (NIZORAL) 2 % cream Apply 1 application  topically at bedtime. qhs to feet and in between toes   ketoconazole (NIZORAL) 2 % shampoo WASH TRUNK, ARMS, SCALP 3 TIMES A WEEK FOR 6 WEEKS, THEN ONCE MONTHLY FOR MAINTENANCE; APPLY, LET SIT FOR 5 MINUTES & THEN RINSE OFF IN SHOWER   latanoprost (XALATAN) 0.005 % ophthalmic solution Place 1 drop into both eyes at bedtime.    Melatonin 5 MG TABS Take 1 tablet by mouth at bedtime.    montelukast (SINGULAIR) 10 MG tablet Take 1 tablet (10 mg total) by mouth at bedtime.   omeprazole (PRILOSEC) 20 MG capsule TAKE 1 CAPSULE BY MOUTH DAILY   tamsulosin (FLOMAX) 0.4 MG CAPS capsule Take 1 capsule (0.4 mg total) by mouth daily.   tamsulosin (FLOMAX) 0.4 MG CAPS capsule Take 2 capsules (0.8 mg total) by mouth daily.   tiotropium (SPIRIVA HANDIHALER) 18 MCG inhalation capsule Place 18 mcg into inhaler and inhale daily.    triamcinolone (KENALOG) 0.1 % Apply 1 application topically 2 (two) times daily as needed.   triamcinolone ointment (KENALOG) 0.1 % Apply 1 application topically as directed. To cracks on fingers as needed   XARELTO 10 MG TABS tablet Take 10 mg by mouth daily.    No facility-administered medications prior to visit.    Review of Systems  Constitutional:  Negative for activity change, appetite change, fatigue and fever.  Gastrointestinal:  Positive for abdominal pain. Negative for  anal bleeding, blood in stool, constipation, diarrhea, nausea, rectal pain and vomiting.  Neurological:  Negative for dizziness, light-headedness and headaches.       Objective    BP 131/76   Pulse 79   Temp 98.4 F (36.9 C)   Resp 16   Ht 6\' 2"  (1.88 m)   Wt 163 lb (73.9 kg)   BMI 20.93 kg/m  BP Readings from Last 3 Encounters:  12/21/20 131/76  09/29/20 (!) 145/72  09/29/20 138/75   Wt Readings from Last 3 Encounters:  12/21/20 163 lb (73.9 kg)  09/29/20 165 lb (74.8 kg)  09/28/20 165 lb (74.8 kg)       Physical Exam Vitals reviewed.  HENT:     Head: Normocephalic and atraumatic.      Right Ear: External ear normal.     Left Ear: External ear normal.  Eyes:     General: No scleral icterus. Cardiovascular:     Rate and Rhythm: Normal rate and regular rhythm.     Heart sounds: Normal heart sounds.  Pulmonary:     Effort: Pulmonary effort is normal.     Breath sounds: Normal breath sounds.  Abdominal:     Palpations: Abdomen is soft.     Tenderness: There is no abdominal tenderness.  Skin:    General: Skin is warm and dry.  Neurological:     General: No focal deficit present.     Mental Status: He is alert and oriented to person, place, and time.  Psychiatric:        Mood and Affect: Mood normal.        Behavior: Behavior normal.        Thought Content: Thought content normal.        Judgment: Judgment normal.      No results found for any visits on 12/21/20.  Assessment & Plan     1. Acute right-sided low back pain without sciatica I think this is all muscular pain.  Will alternate ice and heat especially heat at this point 2 weeks out.  We will try prednisone 10 mg 6-day taper and Flexeril at bedtime for a week or 2  2. Abdominal muscle strain, initial encounter Patient is status post appendectomy.  Again, I think this is all core muscle strain.   No follow-ups on file.      I, Wilhemena Durie, MD, have reviewed all documentation for this visit. The documentation on 12/27/20 for the exam, diagnosis, procedures, and orders are all accurate and complete.    Rex Magee Cranford Mon, MD  [facility] [phone] (phone) [phone] (fax)  [place]

## 2021-01-02 ENCOUNTER — Encounter: Payer: Self-pay | Admitting: Family Medicine

## 2021-01-02 ENCOUNTER — Ambulatory Visit (INDEPENDENT_AMBULATORY_CARE_PROVIDER_SITE_OTHER): Payer: PPO | Admitting: Family Medicine

## 2021-01-02 ENCOUNTER — Other Ambulatory Visit: Payer: Self-pay

## 2021-01-02 VITALS — BP 138/72 | HR 71 | Temp 98.4°F | Resp 16 | Ht 74.0 in | Wt 160.0 lb

## 2021-01-02 DIAGNOSIS — Z0000 Encounter for general adult medical examination without abnormal findings: Secondary | ICD-10-CM | POA: Diagnosis not present

## 2021-01-02 DIAGNOSIS — I10 Essential (primary) hypertension: Secondary | ICD-10-CM

## 2021-01-02 NOTE — Progress Notes (Signed)
Complete physical exam   Patient: [name]   DOB: 1940-12-10   80 y.o. Male  MRN: [id number] Visit Date: 01/02/2021  Today's healthcare provider: Wilhemena Durie, MD   Chief Complaint  Patient presents with   Annual Exam   Subjective    [name] is a 80 y.o. male who presents today for a complete physical exam.  He reports consuming a general diet. Home exercise routine includes walking. He generally feels well. He reports sleeping fairly well.    Past Medical History:  Diagnosis Date   AB (asthmatic bronchitis)    Allergic rhinitis    Arthropathy    Asthma    Cellulitis    DVT (deep venous thrombosis) (HCC)    Foreign body in skin    GERD (gastroesophageal reflux disease)    Glaucoma    Hx MRSA infection    Hx of basal cell carcinoma 02/26/2018   L lat mid back   Hypertension    Resolved - no meds   Insomnia    Osteoarthrosis    Upper back strain    Past Surgical History:  Procedure Laterality Date   APPENDECTOMY     BRONCHOSCOPY     CATARACT EXTRACTION W/PHACO Left 09/01/2019   Procedure: CATARACT EXTRACTION PHACO AND INTRAOCULAR LENS PLACEMENT ([facility]) LEFT 11.72 01:12.2 16.2%;  Surgeon: Leandrew Koyanagi, MD;  Location: [facility];  Service: Ophthalmology;  Laterality: Left;   CATARACT EXTRACTION W/PHACO Right 09/22/2019   Procedure: CATARACT EXTRACTION PHACO AND INTRAOCULAR LENS PLACEMENT ([place]) RIGHT;  Surgeon: Leandrew Koyanagi, MD;  Location: [facility];  Service: Ophthalmology;  Laterality: Right;  6.88 103.4 10.9%   LEFT HEART CATH AND CORONARY ANGIOGRAPHY N/A 08/19/2016   Procedure: Left Heart Cath and Coronary Angiography;  Surgeon: Dionisio David, MD;  Location: [facility];  Service: Cardiovascular;  Laterality: N/A;   NASAL SINUS SURGERY  09/27/2014   Social History   Socioeconomic History   Marital status: Married    Spouse name: Everlene Farrier   Number of children: 2   Years of education: [employer]  education level: Bachelor's degree (e.g., BA, AB, BS)  Occupational History   Occupation: Retired    Fish farm manager: RETIRED    Comment: Engineer  Tobacco Use   Smoking status: Former    Packs/day: 0.50    Years: 3.00    Pack years: 1.50    Types: Cigarettes    Quit date: 06/17/1958    Years since quitting: 62.5   Smokeless tobacco: Never   Tobacco comments:    smoking while in college  Vaping Use   Vaping Use: Never used  Substance and Sexual Activity   Alcohol use: Yes    Alcohol/week: 3.0 - 4.0 standard drinks    Types: 3 - 4 Glasses of wine per week   Drug use: No   Sexual activity: Yes  Other Topics Concern   Not on file  Social History Narrative   He is a married father of 2 daughters.   He smoked casually while in college for about 3 years less than a pack a day.   He drinks 3-4 glass of wine at night. He also has about 2 servings of coffee a day   He is a very active gentleman with [employer] exercise program at least 3-4 days a week.   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   In addition to this  standing his exercise, he does lots of gardening and uses a chain saw to cut wood.            Social Determinants of Health   Financial Resource Strain: Not on file  Food Insecurity: Not on file  Transportation Needs: Not on file  Physical Activity: Not on file  Stress: Not on file  Social Connections: Not on file  Intimate Partner Violence: Not on file   Family Status  Relation Name Status   Sister  Deceased   Mother  Deceased at age 42   Sister  Deceased   Father  Deceased at age 78   Sister  90   Sister  Alive   Daughter  Deceased       Recently past away 2015   Family History  Problem Relation Age of Onset   Breast cancer Sister    Heart failure Mother    Pancreatic cancer Sister    Alzheimer's disease Father    Brain cancer Daughter    Allergies  Allergen Reactions   Timolol Maleate [Timolol Maleate] Shortness Of  Breath   Atorvastatin Swelling    Swelling in legs, dizziness   Bactrim [Sulfamethoxazole-Trimethoprim]     Pt says "it takes his skin pigment off   Budesonide-Formoterol Fumarate Other (See Comments)    SYMBICORT Patient reports dizziness and trembling/shaky feeling.   Lisinopril Other (See Comments)    cough    Patient Care Team: Jerrol Banana., MD as PCP - General (Family Medicine) Leandrew Koyanagi, MD as Referring Physician (Ophthalmology) Robyne Askew, MD as Referring Physician (Internal Medicine) Dory Horn, MD as Referring Physician (Otolaryngology) Teodoro Spray, MD as Consulting Physician (Cardiology) Garrel Ridgel, [place] as Consulting Physician (Podiatry) Bjorn Loser, MD as Consulting Physician (Urology)   Medications: Outpatient Medications Prior to Visit  Medication Sig   ADVAIR HFA 230-21 MCG/ACT inhaler Inhale 2 puffs into the lungs 2 (two) times daily.    albuterol (PROVENTIL HFA;VENTOLIN HFA) 108 (90 Base) MCG/ACT inhaler Inhale into the lungs every 6 (six) hours as needed for wheezing or shortness of breath.    brimonidine (ALPHAGAN) 0.15 % ophthalmic solution Place 1 drop into both eyes 2 (two) times daily.    diltiazem (CARDIZEM CD) 180 MG 24 hr capsule TAKE 1 CAPSULE EVERY DAY   dorzolamide (TRUSOPT) 2 % ophthalmic solution Place 1 drop into both eyes 2 (two) times daily.    finasteride (PROSCAR) 5 MG tablet TAKE ONE TABLET BY MOUTH EVERY DAY   fluticasone (FLONASE) 50 MCG/ACT nasal spray Place 2 sprays into both nostrils 2 (two) times daily.    baclofen (LIORESAL) 10 MG tablet Take 1 tablet (10 mg total) by mouth 3 (three) times daily. As needed for muscle spasm (Patient not taking: Reported on 01/02/2021)   cephALEXin (KEFLEX) 500 MG capsule Take 1 capsule (500 mg total) by mouth 3 (three) times daily.   HYDROcodone-acetaminophen (NORCO/VICODIN) 5-325 MG tablet Take 1 tablet by mouth every 4 (four) hours as needed for moderate pain.    ketoconazole (NIZORAL) 2 % cream Apply 1 application topically at bedtime. qhs to feet and in between toes   ketoconazole (NIZORAL) 2 % shampoo WASH TRUNK, ARMS, SCALP 3 TIMES A WEEK FOR 6 WEEKS, THEN ONCE MONTHLY FOR MAINTENANCE; APPLY, LET SIT FOR 5 MINUTES & THEN RINSE OFF IN SHOWER   latanoprost (XALATAN) 0.005 % ophthalmic solution Place 1 drop into both eyes at bedtime.    Melatonin 5 MG TABS  Take 1 tablet by mouth at bedtime.    montelukast (SINGULAIR) 10 MG tablet Take 1 tablet (10 mg total) by mouth at bedtime.   omeprazole (PRILOSEC) 20 MG capsule TAKE 1 CAPSULE BY MOUTH DAILY   predniSONE (DELTASONE) 10 MG tablet Take 1 tablet (10 mg total) by mouth daily with breakfast. 6 day taper--6-5-4-3-2-1   tamsulosin (FLOMAX) 0.4 MG CAPS capsule Take 1 capsule (0.4 mg total) by mouth daily.   tamsulosin (FLOMAX) 0.4 MG CAPS capsule Take 2 capsules (0.8 mg total) by mouth daily.   tiotropium (SPIRIVA HANDIHALER) 18 MCG inhalation capsule Place 18 mcg into inhaler and inhale daily.    triamcinolone (KENALOG) 0.1 % Apply 1 application topically 2 (two) times daily as needed.   triamcinolone ointment (KENALOG) 0.1 % Apply 1 application topically as directed. To cracks on fingers as needed   XARELTO 10 MG TABS tablet Take 10 mg by mouth daily.    No facility-administered medications prior to visit.    Review of Systems  All other systems reviewed and are negative.     Objective    BP 138/72   Pulse 71   Temp 98.4 F (36.9 C)   Resp 16   Ht 6\' 2"  (1.88 m)   Wt 160 lb (72.6 kg)   BMI 20.54 kg/m  BP Readings from Last 3 Encounters:  01/02/21 138/72  12/21/20 131/76  09/29/20 (!) 145/72   Wt Readings from Last 3 Encounters:  01/02/21 160 lb (72.6 kg)  12/21/20 163 lb (73.9 kg)  09/29/20 165 lb (74.8 kg)      Physical Exam Vitals reviewed.  Constitutional:      Comments: Cachectic WM NAD.  HENT:     Head: Normocephalic and atraumatic.     Right Ear: Tympanic membrane and  external ear normal.     Left Ear: Tympanic membrane and external ear normal.     Nose: Nose normal.  Eyes:     General: No scleral icterus.    Conjunctiva/sclera: Conjunctivae normal.  Cardiovascular:     Rate and Rhythm: Normal rate and regular rhythm.     Heart sounds: Normal heart sounds.  Pulmonary:     Effort: Pulmonary effort is normal.     Breath sounds: Normal breath sounds.  Abdominal:     Palpations: Abdomen is soft.  Genitourinary:    Penis: Normal.      Testes: Normal.     Prostate: Normal.     Rectum: Normal.  Skin:    General: Skin is warm and dry.  Neurological:     General: No focal deficit present.     Mental Status: He is alert and oriented to person, place, and time.  Psychiatric:        Mood and Affect: Mood normal.        Behavior: Behavior normal.        Thought Content: Thought content normal.        Judgment: Judgment normal.      Last depression screening scores PHQ 2/9 Scores 01/02/2021 05/02/2020 12/22/2019  PHQ - 2 Score 0 0 0  PHQ- 9 Score - 0 -   Last fall risk screening Fall Risk  01/02/2021  Falls in the past year? 0  Number falls in past yr: 0  Injury with Fall? 0  Risk for fall due to : No Fall Risks  Follow up Falls evaluation completed   Last Audit-C alcohol use screening Alcohol Use Disorder Test (AUDIT) 01/02/2021  1.  How often do you have a drink containing alcohol? 0  2. How many drinks containing alcohol do you have on a typical day when you are drinking? 0  3. How often do you have six or more drinks on one occasion? 0  AUDIT-C Score 0  Alcohol Brief Interventions/Follow-up -   A score of 3 or more in women, and 4 or more in men indicates increased risk for alcohol abuse, EXCEPT if all of the points are from question 1   No results found for any visits on 01/02/21.  Assessment & Plan    Routine Health Maintenance and Physical Exam  Exercise Activities and Dietary recommendations  Goals   None     Immunization  History  Administered Date(s) Administered   Fluad Quad(high Dose 65+) 02/08/2019, 03/02/2020   Influenza Split 03/02/2012, 03/17/2013, 05/07/2014   Influenza Whole 03/24/2007, 03/15/2009, 02/15/2010   Influenza, High Dose Seasonal PF 05/04/2015, 03/21/2016, 03/24/2017   Influenza-Unspecified 02/23/2018   PFIZER(Purple Top)SARS-COV-2 Vaccination 06/28/2019, 07/19/2019, 03/20/2020   Pneumococcal Conjugate-13 11/17/2013   Pneumococcal Polysaccharide-23 11/19/2004, 03/19/2007, 03/17/2009   Td 05/16/1997, 05/14/2007, 09/11/2010   Zoster Recombinat (Shingrix) 02/23/2018, 04/28/2018   Zoster, Live 01/02/2011    Health Maintenance  Topic Date Due   COVID-19 Vaccine (4 - Booster for Pfizer series) 06/20/2020   TETANUS/TDAP  09/10/2020   INFLUENZA VACCINE  01/15/2021   PNA vac Low Risk Adult  Completed   Zoster Vaccines- Shingrix  Completed   HPV VACCINES  Aged Out    Discussed health benefits of physical activity, and encouraged him to engage in regular exercise appropriate for his age and condition.  1. Annual physical exam   2. Essential hypertension Controlled - CBC with Differential/Platelet - Comprehensive metabolic panel - TSH   No follow-ups on file.     I, Wilhemena Durie, MD, have reviewed all documentation for this visit. The documentation on 01/07/21 for the exam, diagnosis, procedures, and orders are all accurate and complete.    Clinton Brandel Cranford Mon, MD  [facility] [phone] (phone) [phone] (fax)  [place]

## 2021-01-03 LAB — COMPREHENSIVE METABOLIC PANEL
ALT: 15 IU/L (ref 0–44)
AST: 12 IU/L (ref 0–40)
Albumin/Globulin Ratio: 2 (ref 1.2–2.2)
Albumin: 4.2 g/dL (ref 3.6–4.6)
Alkaline Phosphatase: 120 IU/L (ref 44–121)
BUN/Creatinine Ratio: 9 — ABNORMAL LOW (ref 10–24)
BUN: 9 mg/dL (ref 8–27)
Bilirubin Total: 0.6 mg/dL (ref 0.0–1.2)
CO2: 24 mmol/L (ref 20–29)
Calcium: 8.7 mg/dL (ref 8.6–10.2)
Chloride: 101 mmol/L (ref 96–106)
Creatinine, Ser: 1.03 mg/dL (ref 0.76–1.27)
Globulin, Total: 2.1 g/dL (ref 1.5–4.5)
Glucose: 92 mg/dL (ref 65–99)
Potassium: 4.4 mmol/L (ref 3.5–5.2)
Sodium: 137 mmol/L (ref 134–144)
Total Protein: 6.3 g/dL (ref 6.0–8.5)
eGFR: 73 mL/min/{1.73_m2} (ref >59)

## 2021-01-03 LAB — TSH: TSH: 1.96 u[IU]/mL (ref 0.450–4.500)

## 2021-01-03 LAB — CBC WITH DIFFERENTIAL/PLATELET
Basophils Absolute: 0 10*3/uL (ref 0.0–0.2)
Basos: 1 %
EOS (ABSOLUTE): 0.3 10*3/uL (ref 0.0–0.4)
Eos: 4 %
Hematocrit: 45.3 % (ref 37.5–51.0)
Hemoglobin: 15.1 g/dL (ref 13.0–17.7)
Immature Grans (Abs): 0 10*3/uL (ref 0.0–0.1)
Immature Granulocytes: 1 %
Lymphocytes Absolute: 1.3 10*3/uL (ref 0.7–3.1)
Lymphs: 17 %
MCH: 32.5 pg (ref 26.6–33.0)
MCHC: 33.3 g/dL (ref 31.5–35.7)
MCV: 98 fL — ABNORMAL HIGH (ref 79–97)
Monocytes Absolute: 0.7 10*3/uL (ref 0.1–0.9)
Monocytes: 10 %
Neutrophils Absolute: 5 10*3/uL (ref 1.4–7.0)
Neutrophils: 67 %
Platelets: 198 10*3/uL (ref 150–450)
RBC: 4.64 x10E6/uL (ref 4.14–5.80)
RDW: 12.5 % (ref 11.6–15.4)
WBC: 7.4 10*3/uL (ref 3.4–10.8)

## 2021-01-29 ENCOUNTER — Ambulatory Visit: Payer: PPO | Admitting: Podiatry

## 2021-01-29 ENCOUNTER — Encounter: Payer: Self-pay | Admitting: Podiatry

## 2021-01-29 ENCOUNTER — Other Ambulatory Visit: Payer: Self-pay

## 2021-01-29 DIAGNOSIS — M778 Other enthesopathies, not elsewhere classified: Secondary | ICD-10-CM | POA: Diagnosis not present

## 2021-01-29 DIAGNOSIS — G5791 Unspecified mononeuropathy of right lower limb: Secondary | ICD-10-CM

## 2021-01-29 DIAGNOSIS — Z09 Encounter for follow-up examination after completed treatment for conditions other than malignant neoplasm: Secondary | ICD-10-CM | POA: Diagnosis not present

## 2021-01-29 DIAGNOSIS — Z8709 Personal history of other diseases of the respiratory system: Secondary | ICD-10-CM | POA: Diagnosis not present

## 2021-01-29 NOTE — Progress Notes (Signed)
[name] presents today for follow-up of his capsulitis and neuritis dorsal aspect of the right foot.  He states that what ever you used in the last batch made it go away completely.  I have absolutely no pain whatsoever.  Objective: Vital signs are stable he is alert and oriented x3.  There is no erythema edema cellulitis drainage or odor.  No tenderness on palpation of the first intermetatarsal space or the fourth intermetatarsal space.  Assessment: Resolving neuritis and capsulitis.  Plan: Follow-up with me again in 6 months unless that he has absolutely no pain.

## 2021-02-06 ENCOUNTER — Emergency Department
Admission: EM | Admit: 2021-02-06 | Discharge: 2021-02-06 | Disposition: A | Discharge status: Home / Self Care | Payer: PPO | Attending: Emergency Medicine | Admitting: Emergency Medicine

## 2021-02-06 ENCOUNTER — Telehealth: Payer: Self-pay | Admitting: *Deleted

## 2021-02-06 ENCOUNTER — Other Ambulatory Visit: Payer: Self-pay

## 2021-02-06 DIAGNOSIS — I10 Essential (primary) hypertension: Secondary | ICD-10-CM | POA: Insufficient documentation

## 2021-02-06 DIAGNOSIS — Z7951 Long term (current) use of inhaled steroids: Secondary | ICD-10-CM | POA: Diagnosis not present

## 2021-02-06 DIAGNOSIS — R040 Epistaxis: Secondary | ICD-10-CM | POA: Insufficient documentation

## 2021-02-06 DIAGNOSIS — J45909 Unspecified asthma, uncomplicated: Secondary | ICD-10-CM | POA: Insufficient documentation

## 2021-02-06 DIAGNOSIS — Z79899 Other long term (current) drug therapy: Secondary | ICD-10-CM | POA: Diagnosis not present

## 2021-02-06 DIAGNOSIS — Z7901 Long term (current) use of anticoagulants: Secondary | ICD-10-CM | POA: Insufficient documentation

## 2021-02-06 DIAGNOSIS — Z85828 Personal history of other malignant neoplasm of skin: Secondary | ICD-10-CM | POA: Insufficient documentation

## 2021-02-06 DIAGNOSIS — Z87891 Personal history of nicotine dependence: Secondary | ICD-10-CM | POA: Insufficient documentation

## 2021-02-06 MED ORDER — CEPHALEXIN 500 MG PO CAPS
500.0000 mg | ORAL_CAPSULE | Freq: Once | ORAL | Status: AC
  Administered 2021-02-06: 500 mg via ORAL
  Filled 2021-02-06: qty 1

## 2021-02-06 MED ORDER — TRANEXAMIC ACID FOR INHALATION
500.0000 mg | Freq: Once | RESPIRATORY_TRACT | Status: AC
  Administered 2021-02-06: 500 mg via RESPIRATORY_TRACT
  Filled 2021-02-06: qty 10

## 2021-02-06 MED ORDER — OXYMETAZOLINE HCL 0.05 % NA SOLN
1.0000 | Freq: Once | NASAL | Status: AC
  Administered 2021-02-06: 1 via NASAL
  Filled 2021-02-06: qty 30

## 2021-02-06 MED ORDER — CEPHALEXIN 500 MG PO CAPS: 500.0000 mg | ORAL_CAPSULE | Freq: Two times a day (BID) | ORAL | 0 refills | Status: DC

## 2021-02-06 MED ORDER — ACETAMINOPHEN 500 MG PO TABS
1000.0000 mg | ORAL_TABLET | Freq: Once | ORAL | Status: AC
  Administered 2021-02-06: 1000 mg via ORAL
  Filled 2021-02-06: qty 2

## 2021-02-06 NOTE — ED Notes (Signed)
Small amount of bleeding from right nare, nose clamp in place.

## 2021-02-06 NOTE — ED Notes (Signed)
meds given

## 2021-02-06 NOTE — ED Provider Notes (Signed)
[facility] Emergency Department Provider Note  ____________________________________________   Event Date/Time   First MD Initiated Contact with Patient 02/06/21 [phone]     (approximate)  I have reviewed the triage vital signs and the nursing notes.   HISTORY  Chief Complaint Epistaxis    HPI [name] is a 80 y.o. male with history of DVT on Xarelto, hypertension who presents to the emergency department with bleeding from the right nostril for the past 3 hours.  Denies any headache, head injury.  No recent nasal or sinus surgeries.  Denies picking his nose or putting anything into his nose.  No chest pain or shortness of breath.  States symptoms not improving over the past 3 hours at home.  Has had packing previously for epistaxis.  He is requesting packing today.        Past Medical History:  Diagnosis Date   AB (asthmatic bronchitis)    Allergic rhinitis    Arthropathy    Asthma    Cellulitis    DVT (deep venous thrombosis) (HCC)    Foreign body in skin    GERD (gastroesophageal reflux disease)    Glaucoma    Hx MRSA infection    Hx of basal cell carcinoma 02/26/2018   L lat mid back   Hypertension    Resolved - no meds   Insomnia    Osteoarthrosis    Upper back strain     Patient Active Problem List   Diagnosis Date Noted   BPH associated with nocturia 01/15/2018   SOB (shortness of breath) 08/13/2017   Sepsis ([place]) 11/05/2016   Diverticulitis 11/05/2016   History of deep vein thrombosis (DVT) of lower extremity 11/05/2016   HCAP (healthcare-associated pneumonia) 08/29/2016   NSTEMI (non-ST elevated myocardial infarction) ([place]) 08/18/2016   Chronic obstructive pulmonary disease ([place]) 01/02/2016   DVT, femoral, acute ([place]) 05/15/2015   Recurrent pneumonia 12/21/2014   Basal cell carcinoma 10/19/2014   Abnormal liver enzymes 10/19/2014   Personal history of methicillin resistant Staphylococcus aureus 10/19/2014   Cannot sleep  10/19/2014   Heel pain 10/19/2014   Pain in shoulder 10/19/2014   Back strain 10/19/2014   Pulmonary aspergillosis, allergic bronchopulmonary type ([place]) 07/30/2014   ABPA (allergic bronchopulmonary aspergillosis) ([place]) 07/30/2014   CAP (community acquired pneumonia) 05/03/2014   Abnormal CXR 04/01/2014   Paroxysmal tachycardia ([place]) 06/23/2013   Asymptomatic PVCs 06/23/2013   Beat, premature ventricular 06/23/2013   Cough 10/19/2012   Allergic rhinitis 01/17/2009   Asthma, chronic obstructive, without status asthmaticus ([place]) 11/04/2008   CAFL (chronic airflow limitation) ([place]) 11/04/2008   Essential hypertension 02/09/2008   Arthropathia 08/28/2007   Disorder of mitral valve 07/08/2005   Benign prostatic hyperplasia without urinary obstruction 05/16/1997   Low back pain 05/16/1997   Degenerative arthritis of hip 03/18/1995    Past Surgical History:  Procedure Laterality Date   APPENDECTOMY     BRONCHOSCOPY     CATARACT EXTRACTION W/PHACO Left 09/01/2019   Procedure: CATARACT EXTRACTION PHACO AND INTRAOCULAR LENS PLACEMENT (IOC) LEFT 11.72 01:12.2 16.2%;  Surgeon: Leandrew Koyanagi, MD;  Location: [facility];  Service: Ophthalmology;  Laterality: Left;   CATARACT EXTRACTION W/PHACO Right 09/22/2019   Procedure: CATARACT EXTRACTION PHACO AND INTRAOCULAR LENS PLACEMENT ([place]) RIGHT;  Surgeon: Leandrew Koyanagi, MD;  Location: [facility];  Service: Ophthalmology;  Laterality: Right;  6.88 103.4 10.9%   LEFT HEART CATH AND CORONARY ANGIOGRAPHY N/A 08/19/2016   Procedure: Left Heart Cath and Coronary  Angiography;  Surgeon: Dionisio David, MD;  Location: [facility];  Service: Cardiovascular;  Laterality: N/A;   NASAL SINUS SURGERY  09/27/2014    Prior to Admission medications   Medication Sig Start Date End Date Taking? Authorizing Provider  cephALEXin (KEFLEX) 500 MG capsule Take 1 capsule (500 mg total) by mouth 2 (two) times daily. 02/06/21  Yes Mark Hassey,  Delice Bison, DO  ADVAIR HFA 230-21 MCG/ACT inhaler Inhale 2 puffs into the lungs 2 (two) times daily.  11/17/15   [provider]  albuterol (PROVENTIL HFA;VENTOLIN HFA) 108 (90 Base) MCG/ACT inhaler Inhale into the lungs every 6 (six) hours as needed for wheezing or shortness of breath.     [provider]  baclofen (LIORESAL) 10 MG tablet Take 1 tablet (10 mg total) by mouth 3 (three) times daily. As needed for muscle spasm Patient not taking: Reported on 01/02/2021 12/21/20   Jerrol Banana., MD  brimonidine [facility]) 0.2 % ophthalmic solution 1 drop 2 (two) times daily. 01/04/21   [provider]  diltiazem (CARDIZEM CD) 180 MG 24 hr capsule TAKE 1 CAPSULE EVERY DAY 12/11/20   Jerrol Banana., MD  dorzolamide (TRUSOPT) 2 % ophthalmic solution Place 1 drop into both eyes 2 (two) times daily.  08/31/15   [provider]  ELIQUIS 2.5 MG TABS tablet Take 2.5 mg by mouth 2 (two) times daily. 09/29/20   [provider]  finasteride (PROSCAR) 5 MG tablet TAKE ONE TABLET BY MOUTH EVERY DAY 11/29/20   Jerrol Banana., MD  fluticasone [facility]) 50 MCG/ACT nasal spray Place 2 sprays into both nostrils 2 (two) times daily.  06/29/18   [provider]  HYDROcodone-acetaminophen (NORCO/VICODIN) 5-325 MG tablet Take 1 tablet by mouth every 4 (four) hours as needed for moderate pain. 09/29/20 09/29/21  Cuthriell, Charline Bills, PA-C  ketoconazole (NIZORAL) 2 % cream Apply 1 application topically at bedtime. qhs to feet and in between toes 09/14/20   Ralene Bathe, MD  ketoconazole (NIZORAL) 2 % shampoo WASH TRUNK, ARMS, SCALP 3 TIMES A WEEK FOR 6 WEEKS, THEN ONCE MONTHLY FOR MAINTENANCE; APPLY, LET SIT FOR 5 MINUTES & THEN RINSE OFF IN SHOWER 12/19/20   Ralene Bathe, MD  latanoprost (XALATAN) 0.005 % ophthalmic solution Place 1 drop into both eyes at bedtime.     [provider]  Melatonin 5 MG TABS Take 1 tablet by mouth at bedtime.      [provider]  montelukast (SINGULAIR) 10 MG tablet Take by mouth. 01/05/21   [provider]  omeprazole (PRILOSEC) 20 MG capsule TAKE 1 CAPSULE BY MOUTH DAILY 11/20/19   Jerrol Banana., MD  predniSONE (DELTASONE) 10 MG tablet Take 1 tablet (10 mg total) by mouth daily with breakfast. 6 day taper--6-5-4-3-2-1 12/21/20   Jerrol Banana., MD  Sodium Chloride, Inhalant, 7 % NEBU Take 1 vial by nebulization 2 (two) times daily. 01/22/21   [provider]  tamsulosin (FLOMAX) 0.4 MG CAPS capsule Take 1 capsule (0.4 mg total) by mouth daily. 02/15/20   Bjorn Loser, MD  tamsulosin (FLOMAX) 0.4 MG CAPS capsule Take 2 capsules (0.8 mg total) by mouth daily. 05/22/20   Bjorn Loser, MD  tiotropium (SPIRIVA HANDIHALER) 18 MCG inhalation capsule Place 18 mcg into inhaler and inhale daily.  02/06/15 12/22/19  [provider]  triamcinolone (KENALOG) 0.1 % Apply 1 application topically 2 (two) times daily as needed. 05/24/20  Brendolyn Patty, MD  triamcinolone ointment (KENALOG) 0.1 % Apply 1 application topically as directed. To cracks on fingers as needed 08/06/18   [provider]    Allergies Timolol maleate [timolol maleate], Atorvastatin, Bactrim [sulfamethoxazole-trimethoprim], Budesonide-formoterol fumarate, and Lisinopril  Family History  Problem Relation Age of Onset   Breast cancer Sister    Heart failure Mother    Pancreatic cancer Sister    Alzheimer's disease Father    Brain cancer Daughter     Social History Social History   Tobacco Use   Smoking status: Former    Packs/day: 0.50    Years: 3.00    Pack years: 1.50    Types: Cigarettes    Quit date: 06/17/1958    Years since quitting: 62.6   Smokeless tobacco: Never   Tobacco comments:    smoking while in college  Vaping Use   Vaping Use: Never used  Substance Use Topics   Alcohol use: Yes    Alcohol/week: 3.0 - 4.0 standard drinks    Types: 3 - 4 Glasses of wine per  week   Drug use: No    Review of Systems Constitutional: No fever. Eyes: No visual changes. ENT: No sore throat. Cardiovascular: Denies chest pain. Respiratory: Denies shortness of breath. Gastrointestinal: No nausea, vomiting, diarrhea. Genitourinary: Negative for dysuria. Musculoskeletal: Negative for back pain. Skin: Negative for rash. Neurological: Negative for focal weakness or numbness.  ____________________________________________   PHYSICAL EXAM:  VITAL SIGNS: ED Triage Vitals [02/06/21 0132]  Enc Vitals Group     BP (!) 143/91     Pulse Rate 75     Resp 20     Temp 98 F (36.7 C)     Temp Source Oral     SpO2 95 %     Weight 162 lb (73.5 kg)     Height '6\' 2"'$  (1.88 m)     Head Circumference      Peak Flow      Pain Score 0     Pain Loc      Pain Edu?      Excl. in [place]?    CONSTITUTIONAL: Alert and oriented and responds appropriately to questions. Well-appearing; well-nourished, elderly, in no distress HEAD: Normocephalic EYES: Conjunctivae clear, pupils appear equal, EOM appear intact ENT: normal nose; moist mucous membranes, dripping dark red blood and clots coming from the right nostril.  Left nostril appears normal.  Blood in the posterior oropharynx. NECK: Supple, normal ROM CARD: RRR; S1 and S2 appreciated; no murmurs, no clicks, no rubs, no gallops RESP: Normal chest excursion without splinting or tachypnea; breath sounds clear and equal bilaterally; no wheezes, no rhonchi, no rales, no hypoxia or respiratory distress, speaking full sentences ABD/GI: Normal bowel sounds; non-distended; soft, non-tender, no rebound, no guarding, no peritoneal signs, no hepatosplenomegaly BACK: The back appears normal EXT: Normal ROM in all joints; no deformity noted, no edema; no cyanosis SKIN: Normal color for age and race; warm; no rash on exposed skin NEURO: Moves all extremities equally PSYCH: The patient's mood and manner are  appropriate.  ____________________________________________   LABS (all labs ordered are listed, but only abnormal results are displayed)  Labs Reviewed - No data to display ____________________________________________  EKG   ____________________________________________  RADIOLOGY I, Latoi Giraldo, personally viewed and evaluated these images (plain radiographs) as part of my medical decision making, as well as reviewing the written report by the radiologist.  ED MD interpretation:    Official radiology report(s): No results  found.  ____________________________________________   PROCEDURES  Procedure(s) performed (including Critical Care):  .Epistaxis Management  Date/Time: 02/06/2021 2:17 AM Performed by: Wilmer Berryhill, Delice Bison, DO Authorized by: Bethanne Mule, Delice Bison, DO   Consent:    Consent obtained:  Verbal   Consent given by:  Patient   Risks, benefits, and alternatives were discussed: yes     Risks discussed:  Bleeding, nasal injury, pain and infection   Alternatives discussed:  Alternative treatment Universal protocol:    Procedure explained and questions answered to patient or proxy's satisfaction: yes     Relevant documents present and verified: yes     Test results available: yes     Imaging studies available: yes     Required blood products, implants, devices, and special equipment available: yes     Site/side marked: yes     Immediately prior to procedure, a time out was called: yes     Patient identity confirmed:  Verbally with patient Anesthesia:    Anesthesia method:  None Procedure details:    Treatment site:  R posterior (Afrin soaked double balloon posterior rhino rocket)   Treatment method:  Nasal tampon   Treatment complexity:  Extensive   Treatment episode: recurring   Post-procedure details:    Assessment:  Bleeding stopped   Procedure completion:  Tolerated with difficulty Comments:     Also given TXA neb which stopped the  bleeding      ____________________________________________   INITIAL IMPRESSION / ASSESSMENT AND PLAN / ED COURSE  As part of my medical decision making, I reviewed the following data within the [facility] notes reviewed and incorporated, Old chart reviewed, and Notes from prior ED visits         Patient here with epistaxis from the right nare.  It is very difficult to tell if this is anterior posterior given the amount of bleeding and clots.  He is requesting packing today.  He is on Xarelto.  Hemodynamically stable.  Bleeding controlled with Afrin soaked posterior double balloon Rhino Rocket.  We will continue to monitor for any signs of recurrent bleeding.  Will start on Keflex prophylactically and give ENT follow-up.  ED PROGRESS  Patient had a small amount of oozing from around the packing.  He appears very anxious and states he is having pressure in his face.  Will give Tylenol.  He is asking for me to remove the packing's that he can blow his nose and then place the packing again.  I have advised him that every time we placed packing we could risk injury to his nose and would not recommend this.  I discussed with him that when we placed the packing it did push a large clot out into his mouth which he was able to spit out.  Recommended TXA nebulizer treatment.  It appears per his records when he had packing previously he did not tolerate it well then either.   3:24 AM   Pt has no further bleeding from around his packing and no blood in the posterior oropharynx.  He states the pain is improved after Tylenol.  I recommended close outpatient ENT follow-up and will discharge on Keflex for prophylaxis.  We did discuss how he could remove the packing at home himself on Friday if he was not able to get an appointment by then.  Recommended Tylenol as needed at home for pain.  Provided him with a bottle of Afrin to use as needed.  Discussed return precautions.  Continues  to be hemodynamically stable, well-appearing.  Will discharge home.   At this time, I do not feel there is any life-threatening condition present. I have reviewed, interpreted and discussed all results (EKG, imaging, lab, urine as appropriate) and exam findings with patient/family. I have reviewed nursing notes and appropriate previous records.  I feel the patient is safe to be discharged home without further emergent workup and can continue workup as an outpatient as needed. Discussed usual and customary return precautions. Patient/family verbalize understanding and are comfortable with this plan.  Outpatient follow-up has been provided as needed. All questions have been answered.  ____________________________________________   FINAL CLINICAL IMPRESSION(S) / ED DIAGNOSES  Final diagnoses:  Right-sided epistaxis     ED Discharge Orders          Ordered    cephALEXin (KEFLEX) 500 MG capsule  2 times daily        02/06/21 0252            *Please note:  [name] was evaluated in Emergency Department on 02/06/2021 for the symptoms described in the history of present illness. He was evaluated in the context of the global COVID-19 pandemic, which necessitated consideration that the patient might be at risk for infection with the SARS-CoV-2 virus that causes COVID-19. Institutional protocols and algorithms that pertain to the evaluation of patients at risk for COVID-19 are in a state of rapid change based on information released by regulatory bodies including the CDC and federal and state organizations. These policies and algorithms were followed during the patient's care in the ED.  Some ED evaluations and interventions may be delayed as a result of limited staffing during and the pandemic.*   Note:  This document was prepared using Dragon voice recognition software and may include unintentional dictation errors.    Xzaria Teo, Delice Bison, DO 02/06/21 [phone]

## 2021-02-06 NOTE — ED Triage Notes (Signed)
Pt in with co nosebleed since yesterday, pt has hx of the same. Is on xarelto due to DVT years ago.

## 2021-02-06 NOTE — Telephone Encounter (Signed)
error 

## 2021-02-06 NOTE — Discharge Instructions (Addendum)
You may take Tylenol 1000 mg every 6 hours as needed for pain.  Please call the ENT office to schedule close outpatient follow-up.  If they are not able to see you by Friday, you may use the syringe it was provided to you to remove all of the air from both of the balloons and slowly remove the nasal packing yourself at home.  Please take your antibiotics until complete.  Once your nasal packing has been removed, you may use nasal saline to keep your mucous membranes moist. You may use a humidifier.  Other than nasal saline, please do not put anything into your nose for the next 3-4 days. Please do not blow your nose for the next 3-4 days. If your nose begins to bleed again, at this time it is okay to blow your nose and blow out all of the clots and then spray Afrin nasal spray into both nostrils and hold direct pressure for 30 minutes without stopping. If this does not stop the bleeding, please return to the hospital.

## 2021-02-09 DIAGNOSIS — R040 Epistaxis: Secondary | ICD-10-CM | POA: Diagnosis not present

## 2021-02-15 ENCOUNTER — Ambulatory Visit: Payer: PPO | Admitting: Dermatology

## 2021-02-15 ENCOUNTER — Other Ambulatory Visit: Payer: Self-pay

## 2021-02-15 DIAGNOSIS — L209 Atopic dermatitis, unspecified: Secondary | ICD-10-CM | POA: Diagnosis not present

## 2021-02-15 DIAGNOSIS — L570 Actinic keratosis: Secondary | ICD-10-CM

## 2021-02-15 DIAGNOSIS — L820 Inflamed seborrheic keratosis: Secondary | ICD-10-CM | POA: Diagnosis not present

## 2021-02-15 DIAGNOSIS — L578 Other skin changes due to chronic exposure to nonionizing radiation: Secondary | ICD-10-CM

## 2021-02-15 MED ORDER — MOMETASONE FUROATE 0.1 % EX CREA: TOPICAL_CREAM | CUTANEOUS | 0 refills | Status: DC

## 2021-02-15 NOTE — Patient Instructions (Signed)
Cryotherapy Aftercare  . Wash gently with soap and water everyday.   . Apply Vaseline and Band-Aid daily until healed.  

## 2021-02-15 NOTE — Progress Notes (Signed)
Follow-Up Visit   Subjective  [name] is a 80 y.o. male who presents for the following: Spot Check (Pt c/o itchy spots on his left upper arm and the left side of his face. He states that these areas have been bothersome for approx 2 months. ).  The following portions of the chart were reviewed this encounter and updated as appropriate:  Tobacco  Allergies  Meds  Problems  Med Hx  Surg Hx  Fam Hx     Review of Systems: No other skin or systemic complaints except as noted in HPI or Assessment and Plan.  Objective  Well appearing patient in no apparent distress; mood and affect are within normal limits.  A focused examination was performed including face, scalp, arms. Relevant physical exam findings are noted in the Assessment and Plan.  Left Upper Arm - Anterior Pink patch  forehead and ears x 6 (6) Erythematous thin papules/macules with gritty scale.   scalp x 1, left temple x 1 (2) Erythematous keratotic or waxy stuck-on papule or plaque.   Assessment & Plan  Atopic dermatitis, unspecified type Left Upper Arm - Anterior Start mometasone cream. Apply BID until clear.   Atopic dermatitis (eczema) is a chronic, relapsing, pruritic condition that can significantly affect quality of life. It is often associated with allergic rhinitis and/or asthma and can require treatment with topical medications, phototherapy, or in severe cases a biologic medication called Dupixent in children and adults.   mometasone (ELOCON) 0.1 % cream - Left Upper Arm - Anterior Apply twice a day to affected area on arm until clear.  AK (actinic keratosis) forehead and ears x 6 Actinic keratoses are precancerous spots that appear secondary to cumulative UV radiation exposure/sun exposure over time. They are chronic with expected duration over 1 year. A portion of actinic keratoses will progress to squamous cell carcinoma of the skin. It is not possible to reliably predict which spots will progress  to skin cancer and so treatment is recommended to prevent development of skin cancer.  Recommend daily broad spectrum sunscreen SPF 30+ to sun-exposed areas, reapply every 2 hours as needed.  Recommend staying in the shade or wearing long sleeves, sun glasses (UVA+UVB protection) and wide brim hats (4-inch brim around the entire circumference of the hat). Call for new or changing lesions.  Prior to procedure, discussed risks of blister formation, small wound, skin dyspigmentation, or rare scar following cryotherapy. Recommend Vaseline ointment to treated areas while healing.  Destruction of lesion - forehead and ears x 6 Complexity: simple   Destruction method: cryotherapy   Informed consent: discussed and consent obtained   Timeout:  patient name, date of birth, surgical site, and procedure verified Lesion destroyed using liquid nitrogen: Yes   Region frozen until ice ball extended beyond lesion: Yes   Outcome: patient tolerated procedure well with no complications   Post-procedure details: wound care instructions given    Inflamed seborrheic keratosis scalp x 1, left temple x 1  Prior to procedure, discussed risks of blister formation, small wound, skin dyspigmentation, or rare scar following cryotherapy. Recommend Vaseline ointment to treated areas while healing.  Destruction of lesion - scalp x 1, left temple x 1 Complexity: simple   Destruction method: cryotherapy   Informed consent: discussed and consent obtained   Timeout:  patient name, date of birth, surgical site, and procedure verified Lesion destroyed using liquid nitrogen: Yes   Region frozen until ice ball extended beyond lesion: Yes   Outcome:  patient tolerated procedure well with no complications   Post-procedure details: wound care instructions given    Actinic Damage - chronic, secondary to cumulative UV radiation exposure/sun exposure over time - diffuse scaly erythematous macules with underlying  dyspigmentation - Recommend daily broad spectrum sunscreen SPF 30+ to sun-exposed areas, reapply every 2 hours as needed.  - Recommend staying in the shade or wearing long sleeves, sun glasses (UVA+UVB protection) and wide brim hats (4-inch brim around the entire circumference of the hat). - Call for new or changing lesions.  Return in about 6 weeks (around 03/26/2021) for as scheduled.  IHarriett Sine, CMA, am acting as scribe for Sarina Ser, MD. Documentation: I have reviewed the above documentation for accuracy and completeness, and I agree with the above.  Sarina Ser, MD

## 2021-02-21 ENCOUNTER — Encounter: Payer: Self-pay | Admitting: Dermatology

## 2021-03-04 ENCOUNTER — Ambulatory Visit (INDEPENDENT_AMBULATORY_CARE_PROVIDER_SITE_OTHER): Payer: PPO | Admitting: Family Medicine

## 2021-03-04 DIAGNOSIS — Z0000 Encounter for general adult medical examination without abnormal findings: Secondary | ICD-10-CM | POA: Diagnosis not present

## 2021-03-04 NOTE — Progress Notes (Addendum)
Subjective:   [name] is a 80 y.o. male who presents for Medicare Annual/Subsequent preventive examination. I connected with  [name] on 03/04/21 by a telemedicine enabled telemedicine application and verified that I am speaking with the correct person using two identifiers.   I discussed the limitations of evaluation and management by telemedicine. The patient expressed understanding and agreed to proceed.   Patient location: Home Providers location: Office   Participating parties: [name].CMA   Non-face to face 35 minutes   This was a telephone visit    Cardiac Risk Factors include: none   Objective:    There were no vitals filed for this visit. There is no height or weight on file to calculate BMI.  Advanced Directives 03/04/2021 09/29/2020 09/28/2020 12/22/2019 09/22/2019 09/01/2019 12/07/2018  Does Patient Have a Medical Advance Directive? Yes Yes No Yes Yes Yes Yes  Type of Industrial/product designer of [place];Living will - [place];Living will [place];Living will [place];Living will [place];Living will  Does patient want to make changes to medical advance directive? No - Patient declined - - - - No - Patient declined -  Copy of [place] in Chart? No - copy requested - - Yes - validated most recent copy scanned in chart (See row information) No - copy requested Yes - validated most recent copy scanned in chart (See row information) Yes - validated most recent copy scanned in chart (See row information)  Would patient like information on creating a medical advance directive? - - - - - - -    Current Medications (verified) Outpatient Encounter Medications as of 03/04/2021  Medication Sig   ADVAIR HFA 230-21 MCG/ACT inhaler Inhale 2 puffs into the lungs 2 (two) times daily.    albuterol (PROVENTIL HFA;VENTOLIN HFA)  108 (90 Base) MCG/ACT inhaler Inhale into the lungs every 6 (six) hours as needed for wheezing or shortness of breath.    brimonidine (ALPHAGAN) 0.2 % ophthalmic solution 1 drop 2 (two) times daily.   diltiazem (CARDIZEM CD) 180 MG 24 hr capsule TAKE 1 CAPSULE EVERY DAY   dorzolamide (TRUSOPT) 2 % ophthalmic solution Place 1 drop into both eyes 2 (two) times daily.    finasteride (PROSCAR) 5 MG tablet TAKE ONE TABLET BY MOUTH EVERY DAY   fluticasone (FLONASE) 50 MCG/ACT nasal spray Place 2 sprays into both nostrils 2 (two) times daily.    latanoprost (XALATAN) 0.005 % ophthalmic solution Place 1 drop into both eyes at bedtime.    Melatonin 5 MG TABS Take 1 tablet by mouth at bedtime.    montelukast (SINGULAIR) 10 MG tablet Take by mouth.   Sodium Chloride, Inhalant, 7 % NEBU Take 1 vial by nebulization 2 (two) times daily.   tamsulosin (FLOMAX) 0.4 MG CAPS capsule Take 1 capsule (0.4 mg total) by mouth daily.   tamsulosin (FLOMAX) 0.4 MG CAPS capsule Take 2 capsules (0.8 mg total) by mouth daily.   triamcinolone (KENALOG) 0.1 % Apply 1 application topically 2 (two) times daily as needed.   baclofen (LIORESAL) 10 MG tablet Take 1 tablet (10 mg total) by mouth 3 (three) times daily. As needed for muscle spasm (Patient not taking: No sig reported)   cephALEXin (KEFLEX) 500 MG capsule Take 1 capsule (500 mg total) by mouth 2 (two) times daily. (Patient not taking: Reported on 03/04/2021)   ELIQUIS 2.5 MG TABS tablet  Take 2.5 mg by mouth 2 (two) times daily. (Patient not taking: Reported on 03/04/2021)   HYDROcodone-acetaminophen (NORCO/VICODIN) 5-325 MG tablet Take 1 tablet by mouth every 4 (four) hours as needed for moderate pain. (Patient not taking: Reported on 03/04/2021)   ketoconazole (NIZORAL) 2 % cream Apply 1 application topically at bedtime. qhs to feet and in between toes (Patient not taking: Reported on 03/04/2021)   ketoconazole (NIZORAL) 2 % shampoo WASH TRUNK, ARMS, SCALP 3 TIMES A WEEK FOR  6 WEEKS, THEN ONCE MONTHLY FOR MAINTENANCE; APPLY, LET SIT FOR 5 MINUTES & THEN RINSE OFF IN SHOWER (Patient not taking: Reported on 03/04/2021)   mometasone (ELOCON) 0.1 % cream Apply twice a day to affected area on arm until clear. (Patient not taking: Reported on 03/04/2021)   omeprazole (PRILOSEC) 20 MG capsule TAKE 1 CAPSULE BY MOUTH DAILY (Patient not taking: Reported on 03/04/2021)   predniSONE (DELTASONE) 10 MG tablet Take 1 tablet (10 mg total) by mouth daily with breakfast. 6 day taper--6-5-4-3-2-1 (Patient not taking: Reported on 03/04/2021)   tiotropium (SPIRIVA HANDIHALER) 18 MCG inhalation capsule Place 18 mcg into inhaler and inhale daily.    triamcinolone ointment (KENALOG) 0.1 % Apply 1 application topically as directed. To cracks on fingers as needed (Patient not taking: Reported on 03/04/2021)   No facility-administered encounter medications on file as of 03/04/2021.    Allergies (verified) Timolol maleate [timolol maleate], Atorvastatin, Bactrim [sulfamethoxazole-trimethoprim], Budesonide-formoterol fumarate, and Lisinopril   History: Past Medical History:  Diagnosis Date   AB (asthmatic bronchitis)    Allergic rhinitis    Arthropathy    Asthma    Cellulitis    DVT (deep venous thrombosis) (HCC)    Foreign body in skin    GERD (gastroesophageal reflux disease)    Glaucoma    Hx MRSA infection    Hx of basal cell carcinoma 02/26/2018   L lat mid back   Hypertension    Resolved - no meds   Insomnia    Osteoarthrosis    Upper back strain    Past Surgical History:  Procedure Laterality Date   APPENDECTOMY     BRONCHOSCOPY     CATARACT EXTRACTION W/PHACO Left 09/01/2019   Procedure: CATARACT EXTRACTION PHACO AND INTRAOCULAR LENS PLACEMENT ([facility]) LEFT 11.72 01:12.2 16.2%;  Surgeon: Leandrew Koyanagi, MD;  Location: [facility];  Service: Ophthalmology;  Laterality: Left;   CATARACT EXTRACTION W/PHACO Right 09/22/2019   Procedure: CATARACT EXTRACTION PHACO AND  INTRAOCULAR LENS PLACEMENT ([place]) RIGHT;  Surgeon: Leandrew Koyanagi, MD;  Location: [facility];  Service: Ophthalmology;  Laterality: Right;  6.88 103.4 10.9%   LEFT HEART CATH AND CORONARY ANGIOGRAPHY N/A 08/19/2016   Procedure: Left Heart Cath and Coronary Angiography;  Surgeon: Dionisio David, MD;  Location: [facility];  Service: Cardiovascular;  Laterality: N/A;   NASAL SINUS SURGERY  09/27/2014   Family History  Problem Relation Age of Onset   Breast cancer Sister    Heart failure Mother    Pancreatic cancer Sister    Alzheimer's disease Father    Brain cancer Daughter    Social History   Socioeconomic History   Marital status: Married    Spouse name: Everlene Farrier   Number of children: 2   Years of education: College   Highest education level: Bachelor's degree (e.g., BA, AB, BS)  Occupational History   Occupation: Retired    Fish farm manager: RETIRED    Comment: Engineer  Tobacco Use   Smoking status: Former  Packs/day: 0.50    Years: 3.00    Pack years: 1.50    Types: Cigarettes    Quit date: 06/17/1958    Years since quitting: 62.7   Smokeless tobacco: Never   Tobacco comments:    smoking while in college  Vaping Use   Vaping Use: Never used  Substance and Sexual Activity   Alcohol use: Yes    Alcohol/week: 3.0 - 4.0 standard drinks    Types: 3 - 4 Glasses of wine per week   Drug use: No   Sexual activity: Yes  Other Topics Concern   Not on file  Social History Narrative   He is a married father of 2 daughters.   He smoked casually while in college for about 3 years less than a pack a day.   He drinks 3-4 glass of wine at night. He also has about 2 servings of coffee a day   He is a very active gentleman with Silver [employer]ne[employer] exercise program at least 3-4 days a week.   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   In addition to this standing his exercise, he does lots of gardening and uses a chain saw to cut wood.             Social Determinants of Health   Financial Resource Strain: Low Risk    Difficulty of Paying Living Expenses: Not hard at all  Food Insecurity: No Food Insecurity   Worried About Charity fundraiser in the Last Year: Never true   [place] in the Last Year: Never true  Transportation Needs: No Transportation Needs   Lack of Transportation (Medical): No   Lack of Transportation (Non-Medical): No  Physical Activity: Sufficiently Active   Days of Exercise per Week: 3 days   Minutes of Exercise per Session: 60 min  Stress: No Stress Concern Present   Feeling of Stress : Not at all  Social Connections: Socially Integrated   Frequency of Communication with Friends and Family: More than three times a week   Frequency of Social Gatherings with Friends and Family: More than three times a week   Attends Religious Services: More than 4 times per year   Active Member of [employer] or Organizations: Yes   Attends Music therapist: More than 4 times per year   Marital Status: Married    Tobacco Counseling Counseling given: Not Answered Tobacco comments: smoking while in college   Clinical Intake:     Pain : No/denies pain     Diabetes: No  How often do you need to have someone help you when you read instructions, pamphlets, or other written materials from your doctor or pharmacy?: 1 - Never What is the last grade level you completed in school?: 12th    Interpreter Needed?: No      Activities of Daily Living In your present state of health, do you have any difficulty performing the following activities: 03/04/2021 01/02/2021  Hearing? N N  Vision? Y N  Comment reading glasses -  Difficulty concentrating or making decisions? N N  Walking or climbing stairs? N N  Dressing or bathing? N N  Doing errands, shopping? N N  Preparing Food and eating ? N -  Using the Toilet? N -  In the past six months, have you accidently leaked urine? N -  Do you have problems  with loss of bowel control? N -  Managing your Medications?  N -  Managing your Finances? N -  Housekeeping or managing your Housekeeping? N -  Some recent data might be hidden    Patient Care Team: Jerrol Banana., MD as PCP - General (Family Medicine) Leandrew Koyanagi, MD as Referring Physician (Ophthalmology) Robyne Askew, MD as Referring Physician (Internal Medicine) Dory Horn, MD as Referring Physician (Otolaryngology) Teodoro Spray, MD as Consulting Physician (Cardiology) Garrel Ridgel, [place] as Consulting Physician (Podiatry) Bjorn Loser, MD as Consulting Physician (Urology)  Indicate any recent Medical Services you may have received from other than [facility] providers in the past year (date may be approximate).     Assessment:   This is a routine wellness examination for [place].  Hearing/Vision screen No results found.  Dietary issues and exercise activities discussed: Current Exercise Habits: Structured exercise class, Type of exercise: walking, Time (Minutes): 60, Frequency (Times/Week): 3, Weekly Exercise (Minutes/Week): 180, Intensity: Moderate, Exercise limited by: None identified   Goals Addressed   None    Depression Screen PHQ 2/9 Scores 03/04/2021 01/02/2021 05/02/2020 12/22/2019 12/07/2018 12/02/2017 01/14/2017  PHQ - 2 Score 0 0 0 0 0 0 0  PHQ- 9 Score - - 0 - - - -  Exception Documentation Patient refusal - - - - - -    Fall Risk Fall Risk  03/04/2021 01/02/2021 05/02/2020 12/22/2019 12/07/2018  Falls in the past year? 0 0 0 0 0  Number falls in past yr: 0 0 0 0 -  Injury with Fall? 0 0 0 0 -  Risk for fall due to : No Fall Risks No Fall Risks - - -  Follow up - Falls evaluation completed Falls evaluation completed - -    FALL RISK PREVENTION PERTAINING TO THE HOME:  Any stairs in or around the home? Yes  If so, are there any without handrails? Yes  Home free of loose throw rugs in walkways, pet beds, electrical cords, etc? No  Adequate  lighting in your home to reduce risk of falls? Yes   ASSISTIVE DEVICES UTILIZED TO PREVENT FALLS:  Life alert? No  Use of a cane, walker or w/c? No  Grab bars in the bathroom? Yes  Shower chair or bench in shower? Yes  Elevated toilet seat or a handicapped toilet? Yes    Cognitive Function:     6CIT Screen 03/04/2021 01/02/2021 12/22/2019 12/07/2018 12/31/2016  What Year? 0 points 0 points 0 points 0 points 0 points  What month? 0 points 0 points 0 points 0 points 0 points  What time? 0 points 0 points 0 points 0 points 0 points  Count back from 20 0 points 0 points 0 points 2 points 0 points  Months in reverse 0 points 0 points 0 points 0 points 0 points  Repeat phrase 0 points 0 points 0 points 0 points 4 points  Total Score 0 0 0 2 4    Immunizations Immunization History  Administered Date(s) Administered   Fluad Quad(high Dose 65+) 02/08/2019, 03/02/2020   Influenza Split 03/02/2012, 03/17/2013, 05/07/2014   Influenza Whole 03/24/2007, 03/15/2009, 02/15/2010   Influenza, High Dose Seasonal PF 05/04/2015, 03/21/2016, 03/24/2017   Influenza-Unspecified 02/23/2018   PFIZER(Purple Top)SARS-COV-2 Vaccination 06/28/2019, 07/19/2019, 03/20/2020   Pneumococcal Conjugate-13 11/17/2013   Pneumococcal Polysaccharide-23 11/19/2004, 03/19/2007, 03/17/2009   Td 05/16/1997, 05/14/2007, 09/11/2010   Zoster Recombinat (Shingrix) 02/23/2018, 04/28/2018   Zoster, Live 01/02/2011    TDAP status: Due, Education has been provided regarding the importance of this vaccine. Advised  may receive this vaccine at local pharmacy or Health Dept. Aware to provide a copy of the vaccination record if obtained from local pharmacy or Health Dept. Verbalized acceptance and understanding.  Flu Vaccine status: Due, Education has been provided regarding the importance of this vaccine. Advised may receive this vaccine at local pharmacy or Health Dept. Aware to provide a copy of the vaccination record if obtained from  local pharmacy or Health Dept. Verbalized acceptance and understanding.  Pneumococcal vaccine status: Due, Education has been provided regarding the importance of this vaccine. Advised may receive this vaccine at local pharmacy or Health Dept. Aware to provide a copy of the vaccination record if obtained from local pharmacy or Health Dept. Verbalized acceptance and understanding.  Covid-19 vaccine status: Completed vaccines  Qualifies for Shingles Vaccine? Yes   Zostavax completed Yes   Shingrix Completed?: No.    Education has been provided regarding the importance of this vaccine. Patient has been advised to call insurance company to determine out of pocket expense if they have not yet received this vaccine. Advised may also receive vaccine at local pharmacy or Health Dept. Verbalized acceptance and understanding.  Screening Tests Health Maintenance  Topic Date Due   COVID-19 Vaccine (4 - Booster for Pfizer series) 06/12/2020   TETANUS/TDAP  09/10/2020   INFLUENZA VACCINE  01/15/2021   Zoster Vaccines- Shingrix  Completed   HPV VACCINES  Aged Out    Health Maintenance  Health Maintenance Due  Topic Date Due   COVID-19 Vaccine (4 - Booster for Pfizer series) 06/12/2020   TETANUS/TDAP  09/10/2020   INFLUENZA VACCINE  01/15/2021    Colorectal cancer screening: No longer required.   Lung Cancer Screening: (Low Dose CT Chest recommended if Age 31-80 years, 30 pack-year currently smoking OR have quit w/in 15years.) does not qualify.   Lung Cancer Screening Referral: No  Additional Screening:  Hepatitis C Screening: does not qualify; Completed No  Vision Screening: Recommended annual ophthalmology exams for early detection of glaucoma and other disorders of the eye. Is the patient up to date with their annual eye exam?  Yes  Who is the provider or what is the name of the office in which the patient attends annual eye exams? [facility] If pt is not established with a  provider, would they like to be referred to a provider to establish care? No .   Dental Screening: Recommended annual dental exams for proper oral hygiene  Community Resource Referral / Chronic Care Management: CRR required this visit?  No   CCM required this visit?  No      Plan:     I have personally reviewed and noted the following in the patient's chart:   Medical and social history Use of alcohol, tobacco or illicit drugs  Current medications and supplements including opioid prescriptions. Patient is not currently taking opioid prescriptions. Functional ability and status Nutritional status Physical activity Advanced directives List of other physicians Hospitalizations, surgeries, and ER visits in previous 12 months Vitals Screenings to include cognitive, depression, and falls Referrals and appointments  In addition, I have reviewed and discussed with patient certain preventive protocols, quality metrics, and best practice recommendations. A written personalized care plan for preventive services as well as general preventive health recommendations were provided to patient.     Webb Silversmith [place], [place]   03/04/2021   Nurse Notes: Non-face to face 35 minutes   Mr. Batt , Thank you for taking time to come for your  Medicare Wellness Visit. I appreciate your ongoing commitment to your health goals. Please review the following plan we discussed and let me know if I can assist you in the future.   These are the goals we discussed:  Goals   None     This is a list of the screening recommended for you and due dates:  Health Maintenance  Topic Date Due   COVID-19 Vaccine (4 - Booster for Pfizer series) 06/12/2020   Tetanus Vaccine  09/10/2020   Flu Shot  01/15/2021   Zoster (Shingles) Vaccine  Completed   HPV Vaccine  Aged Out

## 2021-03-12 ENCOUNTER — Other Ambulatory Visit: Payer: Self-pay | Admitting: Family Medicine

## 2021-03-14 DIAGNOSIS — J4550 Severe persistent asthma, uncomplicated: Secondary | ICD-10-CM | POA: Diagnosis not present

## 2021-03-14 DIAGNOSIS — J479 Bronchiectasis, uncomplicated: Secondary | ICD-10-CM | POA: Diagnosis not present

## 2021-03-14 DIAGNOSIS — R768 Other specified abnormal immunological findings in serum: Secondary | ICD-10-CM | POA: Diagnosis not present

## 2021-03-14 DIAGNOSIS — Z23 Encounter for immunization: Secondary | ICD-10-CM | POA: Diagnosis not present

## 2021-03-14 DIAGNOSIS — J90 Pleural effusion, not elsewhere classified: Secondary | ICD-10-CM | POA: Diagnosis not present

## 2021-03-16 ENCOUNTER — Ambulatory Visit: Payer: PPO | Attending: Internal Medicine

## 2021-03-16 ENCOUNTER — Other Ambulatory Visit: Payer: Self-pay

## 2021-03-16 DIAGNOSIS — Z23 Encounter for immunization: Secondary | ICD-10-CM

## 2021-03-16 DIAGNOSIS — H401132 Primary open-angle glaucoma, bilateral, moderate stage: Secondary | ICD-10-CM | POA: Diagnosis not present

## 2021-03-16 MED ORDER — PFIZER COVID-19 VAC BIVALENT 30 MCG/0.3ML IM SUSP
INTRAMUSCULAR | 0 refills | Status: DC
  Filled 2021-03-16: qty 0.3, 1d supply, fill #0

## 2021-03-16 NOTE — Progress Notes (Signed)
   Covid-19 Vaccination Clinic  Name:  JIYAN WALKOWSKI    MRN: [id number] DOB: [birth year]  03/16/2021  Mr. Borawski was observed post Covid-19 immunization for 15 minutes without incident. He was provided with Vaccine Information Sheet and instruction to access the V-Safe system.   Mr. Kader was instructed to call 911 with any severe reactions post vaccine: Difficulty breathing  Swelling of face and throat  A fast heartbeat  A bad rash all over body  Dizziness and weakness   Lu Duffel, PharmD, MBA Clinical Acute Care Pharmacist

## 2021-03-26 ENCOUNTER — Other Ambulatory Visit: Payer: Self-pay

## 2021-03-26 ENCOUNTER — Ambulatory Visit: Payer: PPO | Admitting: Dermatology

## 2021-03-26 DIAGNOSIS — L820 Inflamed seborrheic keratosis: Secondary | ICD-10-CM

## 2021-03-26 DIAGNOSIS — Z85828 Personal history of other malignant neoplasm of skin: Secondary | ICD-10-CM | POA: Diagnosis not present

## 2021-03-26 DIAGNOSIS — L2089 Other atopic dermatitis: Secondary | ICD-10-CM

## 2021-03-26 DIAGNOSIS — L570 Actinic keratosis: Secondary | ICD-10-CM

## 2021-03-26 DIAGNOSIS — L80 Vitiligo: Secondary | ICD-10-CM | POA: Diagnosis not present

## 2021-03-26 DIAGNOSIS — L258 Unspecified contact dermatitis due to other agents: Secondary | ICD-10-CM | POA: Diagnosis not present

## 2021-03-26 DIAGNOSIS — L578 Other skin changes due to chronic exposure to nonionizing radiation: Secondary | ICD-10-CM

## 2021-03-26 MED ORDER — MOMETASONE FUROATE 0.1 % EX CREA: TOPICAL_CREAM | CUTANEOUS | 1 refills | Status: DC

## 2021-03-26 MED ORDER — OPZELURA 1.5 % EX CREA: 1.0000 "application " | TOPICAL_CREAM | Freq: Every day | CUTANEOUS | 3 refills | Status: DC

## 2021-03-26 NOTE — Progress Notes (Signed)
Follow-Up Visit   Subjective  [name] is a 80 y.o. male who presents for the following: Actinic Keratosis (Of the forehead and scalp x 6 - recheck for new or persistent skin lesions. ), Eczema (Of the L upper arm - used Mometasone cream which did help some, but he does still have occasional itch at the area and is out of the Mometasone cream), and Rash (On the abdomen around the waistline - itchy and irritated, patient would like checked today.).  The following portions of the chart were reviewed this encounter and updated as appropriate:   Tobacco  Allergies  Meds  Problems  Med Hx  Surg Hx  Fam Hx     Review of Systems:  No other skin or systemic complaints except as noted in HPI or Assessment and Plan.  Objective  Well appearing patient in no apparent distress; mood and affect are within normal limits.  A focused examination was performed including the face, scalp, trunk, and extremities. Relevant physical exam findings are noted in the Assessment and Plan.  Face x 16 (16) Erythematous thin papules/macules with gritty scale.   Trunk, extremities Hypopigmented macules.  Face, trunk, extremities x 6 (6) Erythematous keratotic or waxy stuck-on papule or plaque.   L upper arm Scaly erythematous papules and patches +/- dyspigmentation, lichenification, excoriations.    Assessment & Plan  AK (actinic keratosis) Face x 16  Destruction of lesion - Face x 16 Complexity: simple   Destruction method: cryotherapy   Informed consent: discussed and consent obtained   Timeout:  patient name, date of birth, surgical site, and procedure verified Lesion destroyed using liquid nitrogen: Yes   Region frozen until ice ball extended beyond lesion: Yes   Outcome: patient tolerated procedure well with no complications   Post-procedure details: wound care instructions given    Vitiligo Trunk, extremities Reviewed chronic nature, no cure and can be difficult to treat.  Vitiligo  is an autoimmune condition which causes loss of skin pigment and is commonly seen on the face and may also involve areas of trauma like hands, elbows, knees, and ankles.  Treatments include topical steroids and other topical anti-inflammatory ointments/creams.  Sometimes narrow band UV light therapy or Xtrac laser is helpful, both of which require twice weekly treatments for at least 3-6 months.  Antioxidant vitamins, such as Vitamins C&E, and alpha lipoic acid may be added to enhance treatment.  Start Opzelura to aa's QD.   Inflamed seborrheic keratosis Face, trunk, extremities x 6  Destruction of lesion - Face, trunk, extremities x 6 Complexity: simple   Destruction method: cryotherapy   Informed consent: discussed and consent obtained   Timeout:  patient name, date of birth, surgical site, and procedure verified Lesion destroyed using liquid nitrogen: Yes   Region frozen until ice ball extended beyond lesion: Yes   Outcome: patient tolerated procedure well with no complications   Post-procedure details: wound care instructions given    Other atopic dermatitis L upper arm Atopic dermatitis (eczema) is a chronic, relapsing, pruritic condition that can significantly affect quality of life. It is often associated with allergic rhinitis and/or asthma and can require treatment with topical medications, phototherapy, or in severe cases a biologic medication called Dupixent in children and adults.   Start Opzelura QD.   Ruxolitinib Phosphate (OPZELURA) 1.5 % CREA - L upper arm Apply 1 application topically daily. Apply it itchy areas and areas of vitiligo QD.  Contact dermatitis due to other agent, unspecified contact  dermatitis type Abdomen Start Mometasone to aa's QD PRN. Topical steroids (such as triamcinolone, fluocinolone, fluocinonide, mometasone, clobetasol, halobetasol, betamethasone, hydrocortisone) can cause thinning and lightening of the skin if they are used for too long in the  same area. Your physician has selected the right strength medicine for your problem and area affected on the body. Please use your medication only as directed by your physician to prevent side effects.   mometasone (ELOCON) 0.1 % cream - Abdomen Apply to itchy areas of eczema on the arms and abdomen QD PRN. Avoid the face, groin, and axilla.  Actinic Damage - chronic, secondary to cumulative UV radiation exposure/sun exposure over time - diffuse scaly erythematous macules with underlying dyspigmentation - Recommend daily broad spectrum sunscreen SPF 30+ to sun-exposed areas, reapply every 2 hours as needed.  - Recommend staying in the shade or wearing long sleeves, sun glasses (UVA+UVB protection) and wide brim hats (4-inch brim around the entire circumference of the hat). - Call for new or changing lesions.  History of Basal Cell Carcinoma of the Skin - No evidence of recurrence today - Recommend regular full body skin exams - Recommend daily broad spectrum sunscreen SPF 30+ to sun-exposed areas, reapply every 2 hours as needed.  - Call if any new or changing lesions are noted between office visits  Return in about 6 months (around 09/24/2021) for appointment as scheduled.  Luther Redo, CMA, am acting as scribe for Sarina Ser, MD .  Documentation: I have reviewed the above documentation for accuracy and completeness, and I agree with the above.  Sarina Ser, MD

## 2021-03-26 NOTE — Patient Instructions (Signed)

## 2021-03-27 ENCOUNTER — Encounter: Payer: Self-pay | Admitting: Dermatology

## 2021-03-27 ENCOUNTER — Other Ambulatory Visit: Payer: Self-pay

## 2021-03-27 DIAGNOSIS — L2089 Other atopic dermatitis: Secondary | ICD-10-CM

## 2021-03-27 MED ORDER — OPZELURA 1.5 % EX CREA: 1.0000 "application " | TOPICAL_CREAM | Freq: Every day | CUTANEOUS | 3 refills | Status: DC

## 2021-03-27 NOTE — Progress Notes (Signed)
Rx not available at pts local pharmacy. Left vm notifying pt of transfer to [facility] and advised to call office with any questions.

## 2021-03-29 ENCOUNTER — Other Ambulatory Visit: Payer: Self-pay | Admitting: Family Medicine

## 2021-03-29 DIAGNOSIS — N401 Enlarged prostate with lower urinary tract symptoms: Secondary | ICD-10-CM

## 2021-04-09 ENCOUNTER — Telehealth: Payer: Self-pay

## 2021-04-09 NOTE — Telephone Encounter (Signed)
error 

## 2021-05-11 ENCOUNTER — Telehealth: Payer: PPO | Admitting: Emergency Medicine

## 2021-05-11 NOTE — Progress Notes (Signed)
Mr. Kirksey has a complicated pulmonary history for which he sees a pulmonologist. His pulmonologist recently started him on azithromycin.  After reviewing his medical history and his notes from his pulmonologist, I do not feel I can appropriately address his cough symptom via this E-visit.  I recommended he follow-up with his pulmonologist.  Willeen Cass, PhD, FNP-BC

## 2021-05-28 ENCOUNTER — Other Ambulatory Visit: Payer: Self-pay

## 2021-05-28 ENCOUNTER — Encounter: Payer: Self-pay | Admitting: Urology

## 2021-05-28 ENCOUNTER — Ambulatory Visit: Payer: PPO | Admitting: Urology

## 2021-05-28 VITALS — BP 135/72 | HR 73 | Ht 74.0 in | Wt 162.0 lb

## 2021-05-28 DIAGNOSIS — R351 Nocturia: Secondary | ICD-10-CM | POA: Diagnosis not present

## 2021-05-28 MED ORDER — TAMSULOSIN HCL 0.4 MG PO CAPS: 0.8000 mg | ORAL_CAPSULE | Freq: Every day | ORAL | 3 refills | Status: DC

## 2021-05-28 NOTE — Progress Notes (Signed)
05/28/2021 8:16 AM   [name] March 20, 1941 [id number]  Referring provider: Jerrol Banana., MD [address] [place],  [place] [address]  Chief Complaint  Patient presents with   Follow-up    HPI: I was consulted to assess the patient's acute onset of nighttime frequency every 2 hours.  It is now every 3 hours.  He stopped gabapentin which had been added and he had leg edema.  Right leg is normalized.  Left leg much improved.  He is still using TED hose.  Flow is reasonable.  Frequency every 2 hours during the day.   Patient nighttime frequency likely from fluid mobilization.  Role of alpha-blocker versus watchful waiting described.  Patient has an ill-defined right middle discomfort that I did not think was from the genitourinary system.  It comes and goes and is been present for a few weeks.  We did not x-ray him today.  He would like to try Flomax.  Reassess in 6 weeks.  If he normalizes we might want to try him off it in the future.   Today Flow is much better on the medication but still gets up every 2 hours to void.  No infections or blood   Today Flow was good.  Mild frequency and nocturia stable.  No blood.  No infections Small benign prostate The patient want to try 2 tablets a day because of his nocturia every 2 hours    TOday Frequency stable.  Still gets about every 2 hours at night.  Sometimes flow is weak.  Not interested in surgery   PMH: Past Medical History:  Diagnosis Date   AB (asthmatic bronchitis)    Allergic rhinitis    Arthropathy    Asthma    Cellulitis    DVT (deep venous thrombosis) (HCC)    Foreign body in skin    GERD (gastroesophageal reflux disease)    Glaucoma    Hx MRSA infection    Hx of basal cell carcinoma 02/26/2018   L lat mid back   Hypertension    Resolved - no meds   Insomnia    Osteoarthrosis    Upper back strain     Surgical History: Past Surgical History:  Procedure Laterality Date   APPENDECTOMY      BRONCHOSCOPY     CATARACT EXTRACTION W/PHACO Left 09/01/2019   Procedure: CATARACT EXTRACTION PHACO AND INTRAOCULAR LENS PLACEMENT ([facility]) LEFT 11.72 01:12.2 16.2%;  Surgeon: Leandrew Koyanagi, MD;  Location: [facility];  Service: Ophthalmology;  Laterality: Left;   CATARACT EXTRACTION W/PHACO Right 09/22/2019   Procedure: CATARACT EXTRACTION PHACO AND INTRAOCULAR LENS PLACEMENT ([facility]) RIGHT;  Surgeon: Leandrew Koyanagi, MD;  Location: [facility];  Service: Ophthalmology;  Laterality: Right;  6.88 103.4 10.9%   LEFT HEART CATH AND CORONARY ANGIOGRAPHY N/A 08/19/2016   Procedure: Left Heart Cath and Coronary Angiography;  Surgeon: Dionisio David, MD;  Location: [facility];  Service: Cardiovascular;  Laterality: N/A;   NASAL SINUS SURGERY  09/27/2014    Home Medications:  Allergies as of 05/28/2021       Reactions   Timolol Maleate [timolol Maleate] Shortness Of Breath   Atorvastatin Swelling   Swelling in legs, dizziness   Bactrim [sulfamethoxazole-trimethoprim]    Pt says "it takes his skin pigment off   Budesonide-formoterol Fumarate Other (See Comments)   SYMBICORT Patient reports dizziness and trembling/shaky feeling.   Lisinopril Other (See Comments)   cough  Medication List        Accurate as of May 28, 2021  8:16 AM. If you have any questions, ask your nurse or doctor.          STOP taking these medications    baclofen 10 MG tablet Commonly known as: LIORESAL Stopped by: Reece Packer, MD   cephALEXin 500 MG capsule Commonly known as: KEFLEX Stopped by: Reece Packer, MD   Eliquis 2.5 MG Tabs tablet Generic drug: apixaban Stopped by: Reece Packer, MD   predniSONE 10 MG tablet Commonly known as: DELTASONE Stopped by: Reece Packer, MD       TAKE these medications    Advair HFA 230-21 MCG/ACT inhaler Generic drug: fluticasone-salmeterol Inhale 2 puffs into the lungs 2 (two) times daily.    albuterol 108 (90 Base) MCG/ACT inhaler Commonly known as: VENTOLIN HFA Inhale into the lungs every 6 (six) hours as needed for wheezing or shortness of breath.   brimonidine 0.2 % ophthalmic solution Commonly known as: ALPHAGAN 1 drop 2 (two) times daily.   diltiazem 180 MG 24 hr capsule Commonly known as: CARDIZEM CD TAKE 1 CAPSULE EVERY DAY   dorzolamide 2 % ophthalmic solution Commonly known as: TRUSOPT Place 1 drop into both eyes 2 (two) times daily.   finasteride 5 MG tablet Commonly known as: PROSCAR TAKE ONE TABLET BY MOUTH EVERY DAY   fluticasone 50 MCG/ACT nasal spray Commonly known as: FLONASE Place 2 sprays into both nostrils 2 (two) times daily.   HYDROcodone-acetaminophen 5-325 MG tablet Commonly known as: NORCO/VICODIN Take 1 tablet by mouth every 4 (four) hours as needed for moderate pain.   ketoconazole 2 % cream Commonly known as: NIZORAL Apply 1 application topically at bedtime. qhs to feet and in between toes   ketoconazole 2 % shampoo Commonly known as: NIZORAL WASH TRUNK, ARMS, SCALP 3 TIMES A WEEK FOR 6 WEEKS, THEN ONCE MONTHLY FOR MAINTENANCE; APPLY, LET SIT FOR 5 MINUTES & THEN RINSE OFF IN SHOWER   latanoprost 0.005 % ophthalmic solution Commonly known as: XALATAN Place 1 drop into both eyes at bedtime.   melatonin 5 MG Tabs Take 1 tablet by mouth at bedtime.   mometasone 0.1 % cream Commonly known as: ELOCON Apply twice a day to affected area on arm until clear.   mometasone 0.1 % cream Commonly known as: ELOCON Apply to itchy areas of eczema on the arms and abdomen QD PRN. Avoid the face, groin, and axilla.   montelukast 10 MG tablet Commonly known as: SINGULAIR Take by mouth.   omeprazole 20 MG capsule Commonly known as: PRILOSEC TAKE 1 CAPSULE BY MOUTH DAILY   Opzelura 1.5 % Crea Generic drug: Ruxolitinib Phosphate Apply 1 application topically daily. Apply it itchy areas and areas of vitiligo QD.   Pfizer COVID-19 Vac  Bivalent injection Generic drug: COVID-19 mRNA bivalent vaccine Therapist, music) Inject into the muscle.   Sodium Chloride (Inhalant) 7 % Nebu Take 1 vial by nebulization 2 (two) times daily.   Spiriva HandiHaler 18 MCG inhalation capsule Generic drug: tiotropium Place 18 mcg into inhaler and inhale daily.   tamsulosin 0.4 MG Caps capsule Commonly known as: FLOMAX Take 2 capsules (0.8 mg total) by mouth daily.   triamcinolone ointment 0.1 % Commonly known as: KENALOG Apply 1 application topically as directed. To cracks on fingers as needed   triamcinolone cream 0.1 % Commonly known as: KENALOG Apply 1 application topically 2 (two) times daily as needed.  Allergies:  Allergies  Allergen Reactions   Timolol Maleate [Timolol Maleate] Shortness Of Breath   Atorvastatin Swelling    Swelling in legs, dizziness   Bactrim [Sulfamethoxazole-Trimethoprim]     Pt says "it takes his skin pigment off   Budesonide-Formoterol Fumarate Other (See Comments)    SYMBICORT Patient reports dizziness and trembling/shaky feeling.   Lisinopril Other (See Comments)    cough    Family History: Family History  Problem Relation Age of Onset   Breast cancer Sister    Heart failure Mother    Pancreatic cancer Sister    Alzheimer's disease Father    Brain cancer Daughter     Social History:  reports that he quit smoking about 62 years ago. His smoking use included cigarettes. He has a 1.50 pack-year smoking history. He has never used smokeless tobacco. He reports current alcohol use of about 3.0 - 4.0 standard drinks per week. He reports that he does not use drugs.  ROS:                                        Physical Exam: Ht 6\' 2"  (1.88 m)   Wt 73.5 kg   BMI 20.80 kg/m   Constitutional:  Alert and oriented, No acute distress. HEENT: [place] AT, moist mucus membranes.  Trachea midline, no masses.  Laboratory Data: Lab Results  Component Value Date   WBC 7.4  01/02/2021   HGB 15.1 01/02/2021   HCT 45.3 01/02/2021   MCV 98 (H) 01/02/2021   PLT 198 01/02/2021    Lab Results  Component Value Date   CREATININE 1.03 01/02/2021    Lab Results  Component Value Date   PSA 3.6 11/17/2013    No results found for: TESTOSTERONE  Lab Results  Component Value Date   HGBA1C 5.4 08/18/2016    Urinalysis    Component Value Date/Time   COLORURINE YELLOW (A) 04/29/2018 1335   APPEARANCEUR CLEAR (A) 04/29/2018 1335   APPEARANCEUR Clear 01/26/2018 1303   LABSPEC 1.014 04/29/2018 1335   LABSPEC 1.012 04/07/2014 1745   PHURINE 7.0 04/29/2018 1335   GLUCOSEU NEGATIVE 04/29/2018 1335   GLUCOSEU Negative 04/07/2014 1745   HGBUR NEGATIVE 04/29/2018 1335   BILIRUBINUR negative 03/11/2019 1157   BILIRUBINUR Negative 01/26/2018 1303   BILIRUBINUR Negative 04/07/2014 1745   KETONESUR 20 (A) 04/29/2018 1335   PROTEINUR Negative 03/11/2019 1157   PROTEINUR NEGATIVE 04/29/2018 1335   UROBILINOGEN 0.2 03/11/2019 1157   NITRITE Negative 03/11/2019 1157   NITRITE NEGATIVE 04/29/2018 1335   LEUKOCYTESUR Negative 03/11/2019 1157   LEUKOCYTESUR Negative 01/26/2018 1303   LEUKOCYTESUR Negative 04/07/2014 1745    Pertinent Imaging:   Assessment & Plan: 90-day prescription of Flomax 0.8 mg sent to pharmacy with 3 refills.  I will see in 1 year  There are no diagnoses linked to this encounter.  No follow-ups on file.  Reece Packer, MD  [facility] [address], [place], [place] [address] [phone]

## 2021-06-04 ENCOUNTER — Other Ambulatory Visit: Payer: Self-pay | Admitting: Family Medicine

## 2021-06-04 DIAGNOSIS — I479 Paroxysmal tachycardia, unspecified: Secondary | ICD-10-CM

## 2021-06-20 DIAGNOSIS — J4550 Severe persistent asthma, uncomplicated: Secondary | ICD-10-CM | POA: Diagnosis not present

## 2021-06-20 DIAGNOSIS — I440 Atrioventricular block, first degree: Secondary | ICD-10-CM | POA: Diagnosis not present

## 2021-06-20 DIAGNOSIS — B4481 Allergic bronchopulmonary aspergillosis: Secondary | ICD-10-CM | POA: Diagnosis not present

## 2021-06-20 DIAGNOSIS — J479 Bronchiectasis, uncomplicated: Secondary | ICD-10-CM | POA: Diagnosis not present

## 2021-06-20 DIAGNOSIS — Z7951 Long term (current) use of inhaled steroids: Secondary | ICD-10-CM | POA: Diagnosis not present

## 2021-06-20 DIAGNOSIS — J90 Pleural effusion, not elsewhere classified: Secondary | ICD-10-CM | POA: Diagnosis not present

## 2021-06-20 DIAGNOSIS — I452 Bifascicular block: Secondary | ICD-10-CM | POA: Diagnosis not present

## 2021-06-21 ENCOUNTER — Ambulatory Visit: Payer: PPO | Admitting: Dermatology

## 2021-06-21 ENCOUNTER — Other Ambulatory Visit: Payer: Self-pay

## 2021-06-21 DIAGNOSIS — B360 Pityriasis versicolor: Secondary | ICD-10-CM | POA: Diagnosis not present

## 2021-06-21 DIAGNOSIS — L2081 Atopic neurodermatitis: Secondary | ICD-10-CM

## 2021-06-21 MED ORDER — KETOCONAZOLE 2 % EX SHAM: 1.0000 "application " | MEDICATED_SHAMPOO | CUTANEOUS | 4 refills | Status: DC

## 2021-06-21 MED ORDER — EUCRISA 2 % EX OINT: 1.0000 "application " | TOPICAL_OINTMENT | CUTANEOUS | 3 refills | Status: DC

## 2021-06-21 NOTE — Patient Instructions (Addendum)
If You Need Anything After Your Visit ° °If you have any questions or concerns for your doctor, please call our main line at [phone] and press option 4 to reach your doctor's medical assistant. If no one answers, please leave a voicemail as directed and we will return your call as soon as possible. Messages left after 4 pm will be answered the following business day.  ° °You may also send us a message via MyChart. We typically respond to MyChart messages within 1-2 business days. ° °For prescription refills, please ask your pharmacy to contact our office. Our fax number is [phone]. ° °If you have an urgent issue when the clinic is closed that cannot wait until the next business day, you can page your doctor at the number below.   ° °Please note that while we do our best to be available for urgent issues outside of office hours, we are not available 24/7.  ° °If you have an urgent issue and are unable to reach us, you may choose to seek medical care at your doctor's office, retail clinic, urgent care center, or emergency room. ° °If you have a medical emergency, please immediately call 911 or go to the emergency department. ° °Pager Numbers ° °- Dr. Kowalski: [phone] ° °- Dr. Moye: [phone] ° °- Dr. Stewart: [phone] ° °In the event of inclement weather, please call our main line at [phone] for an update on the status of any delays or closures. ° °Dermatology Medication Tips: °Please keep the boxes that topical medications come in in order to help keep track of the instructions about where and how to use these. Pharmacies typically print the medication instructions only on the boxes and not directly on the medication tubes.  ° °If your medication is too expensive, please contact our office at [phone] option 4 or send us a message through MyChart.  ° °We are unable to tell what your co-pay for medications will be in advance as this is different depending on your insurance coverage.  However, we may be able to find a substitute medication at lower cost or fill out paperwork to get insurance to cover a needed medication.  ° °If a prior authorization is required to get your medication covered by your insurance company, please allow us 1-2 business days to complete this process. ° °Drug prices often vary depending on where the prescription is filled and some pharmacies may offer cheaper prices. ° °The website www.goodrx.com contains coupons for medications through different pharmacies. The prices here do not account for what the cost may be with help from insurance (it may be cheaper with your insurance), but the website can give you the price if you did not use any insurance.  °- You can print the associated coupon and take it with your prescription to the pharmacy.  °- You may also stop by our office during regular business hours and pick up a GoodRx coupon card.  °- If you need your prescription sent electronically to a different pharmacy, notify our office through [facility] MyChart or by phone at [phone] option 4. ° ° ° ° °Si Usted Necesita Algo Después de Su Visita ° °También puede enviarnos un mensaje a través de MyChart. Por lo general respondemos a los mensajes de MyChart en el transcurso de 1 a 2 días hábiles. ° °Para renovar recetas, por favor pida a su farmacia que se ponga en contacto con nuestra oficina. Nuestro número de fax es el [phone]. ° °Si tiene   un asunto urgente cuando la clnica est cerrada y que no puede esperar hasta el siguiente da hbil, puede llamar/localizar a su doctor(a) al nmero que aparece a continuacin.   Por favor, tenga en cuenta que aunque hacemos todo lo posible para estar disponibles para asuntos urgentes fuera del horario de [place], no estamos disponibles las 24 horas del da, los 7 das de la [place].   Si tiene un problema urgente y no puede comunicarse con nosotros, puede optar por buscar atencin mdica  en el consultorio de su  doctor(a), en una clnica privada, en un centro de atencin urgente o en una sala de emergencias.  Si tiene Engineering geologist, por favor llame inmediatamente al 911 o vaya a la sala de emergencias.  Nmeros de bper  - Dr. Nehemiah Massed: [phone]  - Dra. Moye: [phone]  - Dra. Nicole Kindred: [phone]  En caso de inclemencias del [place], por favor llame a Johnsie Kindred principal al [phone] para una actualizacin sobre el [place] de cualquier retraso o cierre.  Consejos para la medicacin en dermatologa: Por favor, guarde las cajas en las que vienen los medicamentos de uso tpico para ayudarle a seguir las instrucciones sobre dnde y cmo usarlos. Las farmacias generalmente imprimen las instrucciones del medicamento slo en las cajas y no directamente en los tubos del [place].   Si su medicamento es muy caro, por favor, pngase en contacto con Zigmund Daniel llamando al [phone] y presione la opcin 4 o envenos un mensaje a travs de Pharmacist, community.   No podemos decirle cul ser su copago por los medicamentos por adelantado ya que esto es diferente dependiendo de la cobertura de su seguro. Sin embargo, es posible que podamos encontrar un medicamento sustituto a Electrical engineer un formulario para que el seguro cubra el medicamento que se considera necesario.   Si se requiere una autorizacin previa para que su compaa de seguros [place] su medicamento, por favor permtanos de 1 a 2 das hbiles para completar este proceso.  Los precios de los medicamentos varan con frecuencia dependiendo del Environmental consultant de dnde se surte la receta y alguna farmacias pueden ofrecer precios ms baratos.  El sitio web www.goodrx.com tiene cupones para medicamentos de Airline pilot. Los precios aqu no tienen en cuenta lo que podra costar con la ayuda del seguro (puede ser ms barato con su seguro), pero el sitio web puede darle el precio si no utiliz Research scientist (physical sciences).  - Puede imprimir el cupn  correspondiente y llevarlo con su receta a la farmacia.  - Tambin puede pasar por nuestra oficina durante el horario de atencin regular y Charity fundraiser una tarjeta de cupones de GoodRx.  - Si necesita que su receta se enve electrnicamente a una farmacia diferente, informe a nuestra oficina a travs de MyChart de [place] o por telfono llamando al [phone] y presione la opcin 4.   For the Tinea Versicolor on back Wash trunk and arms 2 times weekly when showering, let sit 5-10 minutes before rinsing off  For the Eczema on arms and shoulders  Start Eucrisa oint 1 to 2 times daily to affected areas of eczema on arms and shoulders.

## 2021-06-21 NOTE — Progress Notes (Signed)
° °  Follow-Up Visit   Subjective  [name] is a 81 y.o. male who presents for the following: Eczema (L arm, R arm, itching, using ketoconazole 2% shampoo 3x/wk, ran out of Mona). The patient has spots, moles and lesions to be evaluated, some may be new or changing and the patient has concerns that these could be cancer.  The following portions of the chart were reviewed this encounter and updated as appropriate:   Tobacco   Allergies   Meds   Problems   Med Hx   Surg Hx   Fam Hx      Review of Systems:  No other skin or systemic complaints except as noted in HPI or Assessment and Plan.  Objective  Well appearing patient in no apparent distress; mood and affect are within normal limits.  A focused examination was performed including bil arms. Relevant physical exam findings are noted in the Assessment and Plan.  Left Upper Arm - Anterior Patchy pinkness arms  Mid Back Patchy hyperpigmentation on back    Assessment & Plan  Atopic neurodermatitis Left Upper Arm - Anterior  Atopic dermatitis (eczema) is a chronic, relapsing, pruritic condition that can significantly affect quality of life. It is often associated with allergic rhinitis and/or asthma and can require treatment with topical medications, phototherapy, or in severe cases biologic injectable medication (Dupixent; Adbry) or Oral JAK inhibitors.   Start Eucrisa oint qd/bid aa eczema on arms and shoulders. (Opzelura not covered) samples x 5 Lot SPCX exp 11/24  Crisaborole (EUCRISA) 2 % OINT - Left Upper Arm - Anterior Apply 1 application topically as directed. Qd to bid to aa eczema areas on arms and shoulders until clear, prn flares  Tinea versicolor Mid Back  Tinea versicolor is a chronic recurrent skin rash causing discolored scaly spots most commonly seen on back, chest, and/or shoulders.  It is generally asymptomatic. The rash is due to overgrowth of a common type of yeast present on everyone's skin and it is not  contagious.  It tends to flare more in the summer due to increased sweating on trunk.  After rash is treated, the scaliness will resolve, but the discoloration will take longer to return to normal pigmentation. The periodic use of an OTC medicated soap/shampoo with zinc or selenium sulfide can be helpful to prevent yeast overgrowth and recurrence.   Cont Ketoconazole 2% shampoo 2x/wk to wash trunk and arms  ketoconazole (NIZORAL) 2 % shampoo - Mid Back Apply 1 application topically 2 (two) times a week. Wash trunk and arms 2 times weekly when showering, let sit 5-10 minutes before rinsing off   Return for as scheduled.  I, Othelia Pulling, RMA, am acting as scribe for Sarina Ser, MD . Documentation: I have reviewed the above documentation for accuracy and completeness, and I agree with the above.  Sarina Ser, MD

## 2021-06-25 ENCOUNTER — Encounter: Payer: Self-pay | Admitting: Dermatology

## 2021-06-27 ENCOUNTER — Encounter: Payer: Self-pay | Admitting: Dermatology

## 2021-06-29 DIAGNOSIS — D6869 Other thrombophilia: Secondary | ICD-10-CM | POA: Diagnosis not present

## 2021-06-29 DIAGNOSIS — J449 Chronic obstructive pulmonary disease, unspecified: Secondary | ICD-10-CM | POA: Diagnosis not present

## 2021-06-29 DIAGNOSIS — Z86711 Personal history of pulmonary embolism: Secondary | ICD-10-CM | POA: Diagnosis not present

## 2021-06-29 DIAGNOSIS — R053 Chronic cough: Secondary | ICD-10-CM | POA: Diagnosis not present

## 2021-06-29 DIAGNOSIS — Z8679 Personal history of other diseases of the circulatory system: Secondary | ICD-10-CM | POA: Diagnosis not present

## 2021-06-29 DIAGNOSIS — Z7901 Long term (current) use of anticoagulants: Secondary | ICD-10-CM | POA: Diagnosis not present

## 2021-06-29 DIAGNOSIS — Z9889 Other specified postprocedural states: Secondary | ICD-10-CM | POA: Diagnosis not present

## 2021-06-29 DIAGNOSIS — I2510 Atherosclerotic heart disease of native coronary artery without angina pectoris: Secondary | ICD-10-CM | POA: Diagnosis not present

## 2021-06-29 DIAGNOSIS — I739 Peripheral vascular disease, unspecified: Secondary | ICD-10-CM | POA: Diagnosis not present

## 2021-06-29 DIAGNOSIS — E441 Mild protein-calorie malnutrition: Secondary | ICD-10-CM | POA: Diagnosis not present

## 2021-07-04 ENCOUNTER — Encounter: Payer: Self-pay | Admitting: Podiatry

## 2021-07-04 ENCOUNTER — Other Ambulatory Visit: Payer: Self-pay

## 2021-07-04 ENCOUNTER — Ambulatory Visit: Payer: PPO | Admitting: Podiatry

## 2021-07-04 DIAGNOSIS — M778 Other enthesopathies, not elsewhere classified: Secondary | ICD-10-CM | POA: Diagnosis not present

## 2021-07-04 DIAGNOSIS — G5791 Unspecified mononeuropathy of right lower limb: Secondary | ICD-10-CM

## 2021-07-04 MED ORDER — DEXAMETHASONE SODIUM PHOSPHATE 120 MG/30ML IJ SOLN
2.0000 mg | Freq: Once | INTRAMUSCULAR | Status: AC
  Administered 2021-07-04: 2 mg via INTRA_ARTICULAR

## 2021-07-04 NOTE — Progress Notes (Signed)
[name] presents today for follow-up of his capsulitis and neuritis of his right foot he states that is been sore on the top and the medial side as he points to the first metatarsal cuneiform joint.  States that is rubbing in his shoes and has been swelling some over the past couple of weeks he denies any trauma but does relate a blood clot since he saw [place] last that was behind his knee and he is currently taking Xarelto for.  He states that the blood clot has resolved but he will continue to take the blood thinner.  Objective: Vital signs are stable he is alert and oriented x3 he has strong palpable pulses right foot but he does have osteoarthritic changes of the first metatarsal cuneiform joint with hypertrophic bone growth and pes planovalgus.  Most likely resulting in his symptomatology.  He does have some tenderness right at the insertion of the posterior tibial tendon as well.  Assessment: Insertional posterior tibial tendinitis first tarsometatarsal joint capsulitis with osteoarthritis pes planovalgus.  Plan: I injected the area today with 10 mg of Kenalog not injecting into any tendon subcutaneous and deep toward the joint.  This was on the medial side.  He tolerated procedure well and I will follow-up with him in February for his routine trigger point injections.

## 2021-07-05 ENCOUNTER — Ambulatory Visit: Payer: Self-pay | Admitting: Family Medicine

## 2021-07-05 ENCOUNTER — Ambulatory Visit: Payer: PPO | Admitting: Physician Assistant

## 2021-07-05 DIAGNOSIS — J4550 Severe persistent asthma, uncomplicated: Secondary | ICD-10-CM | POA: Diagnosis not present

## 2021-07-05 DIAGNOSIS — R0600 Dyspnea, unspecified: Secondary | ICD-10-CM | POA: Diagnosis not present

## 2021-07-18 ENCOUNTER — Ambulatory Visit (INDEPENDENT_AMBULATORY_CARE_PROVIDER_SITE_OTHER): Payer: PPO | Admitting: Physician Assistant

## 2021-07-18 ENCOUNTER — Encounter: Payer: Self-pay | Admitting: Physician Assistant

## 2021-07-18 ENCOUNTER — Other Ambulatory Visit: Payer: Self-pay

## 2021-07-18 VITALS — BP 133/69 | HR 63 | Temp 98.0°F | Ht 73.0 in | Wt 162.4 lb

## 2021-07-18 DIAGNOSIS — I10 Essential (primary) hypertension: Secondary | ICD-10-CM

## 2021-07-18 DIAGNOSIS — M67432 Ganglion, left wrist: Secondary | ICD-10-CM

## 2021-07-18 NOTE — Assessment & Plan Note (Signed)
Stable and well controlled on medication

## 2021-07-18 NOTE — Progress Notes (Signed)
Established patient visit   Patient: [name]   DOB: 1940/09/04   81 y.o. Male  MRN: [id number] Visit Date: 07/18/2021  Today's healthcare provider: Mikey Kirschner, PA-C   Cc. HTN f/u  Subjective    HPI   Mr [name] also has questions about two lumps on the inside of his left wrist. Noticed them a few weeks ago, not painful, no rash, but large and in the way.   Hypertension, follow-up  BP Readings from Last 3 Encounters:  07/18/21 133/69  05/28/21 135/72  02/06/21 135/89   Wt Readings from Last 3 Encounters:  07/18/21 162 lb 6.4 oz (73.7 kg)  05/28/21 162 lb (73.5 kg)  02/06/21 162 lb (73.5 kg)     He was last seen for hypertension 6 months ago.  BP at that visit was 138/72. Management since that visit includes none.  He reports good compliance with treatment. He is not having side effects.  He is following a Low Sodium diet. He is exercising. He does not smoke.  Use of agents associated with hypertension: none.   Outside blood pressures are 125/72. Symptoms: No chest pain No chest pressure  No palpitations No syncope  No dyspnea No orthopnea  No paroxysmal nocturnal dyspnea No lower extremity edema   Pertinent labs: Lab Results  Component Value Date   CHOL 138 12/28/2019   HDL 52 12/28/2019   LDLCALC 76 12/28/2019   TRIG 45 12/28/2019   CHOLHDL 1.8 12/08/2018   Lab Results  Component Value Date   NA 137 01/02/2021   K 4.4 01/02/2021   CREATININE 1.03 01/02/2021   EGFR 73 01/02/2021   GLUCOSE 92 01/02/2021   TSH 1.960 01/02/2021     The ASCVD Risk score (Arnett DK, et al., 2019) failed to calculate for the following reasons:   The 2019 ASCVD risk score is only valid for ages 8 to 89   ---------------------------------------------------------------------------------------------------   Medications: Outpatient Medications Prior to Visit  Medication Sig   ADVAIR HFA 230-21 MCG/ACT inhaler Inhale 2 puffs into the lungs 2 (two) times daily.     albuterol (PROVENTIL HFA;VENTOLIN HFA) 108 (90 Base) MCG/ACT inhaler Inhale into the lungs every 6 (six) hours as needed for wheezing or shortness of breath.    brimonidine (ALPHAGAN) 0.2 % ophthalmic solution 1 drop 2 (two) times daily.   COVID-19 mRNA bivalent vaccine, Pfizer, (PFIZER COVID-19 VAC BIVALENT) injection Inject into the muscle.   Crisaborole (EUCRISA) 2 % OINT Apply 1 application topically as directed. Qd to bid to aa eczema areas on arms and shoulders until clear, prn flares   diltiazem (CARDIZEM CD) 180 MG 24 hr capsule TAKE 1 CAPSULE BY MOUTH ONCE DAILY   dorzolamide (TRUSOPT) 2 % ophthalmic solution Place 1 drop into both eyes 2 (two) times daily.    finasteride (PROSCAR) 5 MG tablet TAKE ONE TABLET BY MOUTH EVERY DAY   fluticasone (FLONASE) 50 MCG/ACT nasal spray Place 2 sprays into both nostrils 2 (two) times daily.    ketoconazole (NIZORAL) 2 % shampoo Apply 1 application topically 2 (two) times a week. Wash trunk and arms 2 times weekly when showering, let sit 5-10 minutes before rinsing off   latanoprost (XALATAN) 0.005 % ophthalmic solution Place 1 drop into both eyes at bedtime.    Melatonin 5 MG TABS Take 1 tablet by mouth at bedtime.    mometasone (ELOCON) 0.1 % cream Apply to itchy areas of eczema on the arms and abdomen QD  PRN. Avoid the face, groin, and axilla.   montelukast (SINGULAIR) 10 MG tablet Take by mouth.   omeprazole (PRILOSEC) 20 MG capsule TAKE 1 CAPSULE BY MOUTH DAILY   Ruxolitinib Phosphate (OPZELURA) 1.5 % CREA Apply 1 application topically daily. Apply it itchy areas and areas of vitiligo QD.   Sodium Chloride, Inhalant, 7 % NEBU Take 1 vial by nebulization 2 (two) times daily.   tamsulosin (FLOMAX) 0.4 MG CAPS capsule Take 2 capsules (0.8 mg total) by mouth daily.   triamcinolone (KENALOG) 0.1 % Apply 1 application topically 2 (two) times daily as needed.   triamcinolone ointment (KENALOG) 0.1 % Apply 1 application topically as directed. To cracks  on fingers as needed   XARELTO 10 MG TABS tablet Take 10 mg by mouth daily.   tiotropium (SPIRIVA HANDIHALER) 18 MCG inhalation capsule Place 18 mcg into inhaler and inhale daily.    [DISCONTINUED] HYDROcodone-acetaminophen (NORCO/VICODIN) 5-325 MG tablet Take 1 tablet by mouth every 4 (four) hours as needed for moderate pain.   No facility-administered medications prior to visit.    Review of Systems  Constitutional:  Negative for fatigue and fever.  Respiratory:  Negative for cough and shortness of breath.   Cardiovascular:  Negative for chest pain, palpitations and leg swelling.  Neurological:  Negative for dizziness and headaches.      Objective    Blood pressure 133/69, pulse 63, temperature 98 F (36.7 C), temperature source Oral, height _0  (1.854 m), weight 162 lb 6.4 oz (73.7 kg), SpO2 99 %.   Physical Exam Constitutional:      General: He is awake.     Appearance: He is well-developed.  HENT:     Head: Normocephalic.     Ears:     Comments: Black lesion right ear, 2-3 mm Eyes:     Conjunctiva/sclera: Conjunctivae normal.  Cardiovascular:     Rate and Rhythm: Normal rate and regular rhythm.     Heart sounds: Normal heart sounds.  Pulmonary:     Effort: Pulmonary effort is normal.     Breath sounds: Normal breath sounds.  Musculoskeletal:     Comments: Volar left wrist with two soft but immobile 1-2 cm masses   Skin:    General: Skin is warm.  Neurological:     Mental Status: He is alert and oriented to person, place, and time.  Psychiatric:        Attention and Perception: Attention normal.        Mood and Affect: Mood normal.        Speech: Speech normal.        Behavior: Behavior is cooperative.     No results found for any visits on 07/18/21.  Assessment & Plan     Problem List Items Addressed This Visit       Cardiovascular and Mediastinum   Essential hypertension - Primary (Chronic)    Stable and well controlled on medication      Relevant  Orders   Comprehensive Metabolic Panel (CMET)   Other Visit Diagnoses     Ganglion cyst of volar aspect of left wrist       Relevant Orders   US SOFT TISSUE UPPER EXTREMITY LIMITED LEFT (NON-VASCULAR)      3. Right ear lesion --pt states present 6 mo, has seen derm who does not want to remove   Return in about 6 months (around 01/15/2022) for CPE.      I, Mikey Kirschner, PA-C have reviewed all  documentation for this visit. The documentation on  07/18/2021 for the exam, diagnosis, procedures, and orders are all accurate and complete.    Mikey Kirschner, PA-C  [facility] [phone] (phone) [phone] (fax)  [place]

## 2021-07-19 LAB — COMPREHENSIVE METABOLIC PANEL
ALT: 26 IU/L (ref 0–44)
AST: 26 IU/L (ref 0–40)
Albumin/Globulin Ratio: 2.2 (ref 1.2–2.2)
Albumin: 4.2 g/dL (ref 3.6–4.6)
Alkaline Phosphatase: 106 IU/L (ref 44–121)
BUN/Creatinine Ratio: 11 (ref 10–24)
BUN: 12 mg/dL (ref 8–27)
Bilirubin Total: 0.7 mg/dL (ref 0.0–1.2)
CO2: 24 mmol/L (ref 20–29)
Calcium: 9 mg/dL (ref 8.6–10.2)
Chloride: 102 mmol/L (ref 96–106)
Creatinine, Ser: 1.09 mg/dL (ref 0.76–1.27)
Globulin, Total: 1.9 g/dL (ref 1.5–4.5)
Glucose: 97 mg/dL (ref 70–99)
Potassium: 4.3 mmol/L (ref 3.5–5.2)
Sodium: 139 mmol/L (ref 134–144)
Total Protein: 6.1 g/dL (ref 6.0–8.5)
eGFR: 68 mL/min/{1.73_m2} (ref >59)

## 2021-07-24 ENCOUNTER — Other Ambulatory Visit: Payer: Self-pay | Admitting: Family Medicine

## 2021-07-24 DIAGNOSIS — N401 Enlarged prostate with lower urinary tract symptoms: Secondary | ICD-10-CM

## 2021-07-26 ENCOUNTER — Ambulatory Visit
Admission: RE | Admit: 2021-07-26 | Discharge: 2021-07-26 | Disposition: A | Discharge status: Home / Self Care | Payer: PPO | Source: Ambulatory Visit | Attending: Physician Assistant | Admitting: Physician Assistant

## 2021-07-26 ENCOUNTER — Other Ambulatory Visit: Payer: Self-pay

## 2021-07-26 DIAGNOSIS — M67432 Ganglion, left wrist: Secondary | ICD-10-CM | POA: Insufficient documentation

## 2021-07-26 DIAGNOSIS — M67472 Ganglion, left ankle and foot: Secondary | ICD-10-CM | POA: Diagnosis not present

## 2021-07-30 ENCOUNTER — Other Ambulatory Visit: Payer: Self-pay

## 2021-07-30 DIAGNOSIS — M67432 Ganglion, left wrist: Secondary | ICD-10-CM

## 2021-08-01 ENCOUNTER — Ambulatory Visit: Payer: PPO | Admitting: Podiatry

## 2021-08-01 ENCOUNTER — Encounter: Payer: Self-pay | Admitting: Podiatry

## 2021-08-01 ENCOUNTER — Other Ambulatory Visit: Payer: Self-pay

## 2021-08-01 DIAGNOSIS — M778 Other enthesopathies, not elsewhere classified: Secondary | ICD-10-CM

## 2021-08-01 DIAGNOSIS — M76821 Posterior tibial tendinitis, right leg: Secondary | ICD-10-CM

## 2021-08-01 DIAGNOSIS — G5791 Unspecified mononeuropathy of right lower limb: Secondary | ICD-10-CM | POA: Diagnosis not present

## 2021-08-01 MED ORDER — DEXAMETHASONE SODIUM PHOSPHATE 120 MG/30ML IJ SOLN
2.0000 mg | Freq: Once | INTRAMUSCULAR | Status: AC
  Administered 2021-08-01: 2 mg via INTRA_ARTICULAR

## 2021-08-01 MED ORDER — TRIAMCINOLONE ACETONIDE 40 MG/ML IJ SUSP
40.0000 mg | Freq: Once | INTRAMUSCULAR | Status: AC
  Administered 2021-08-01: 40 mg

## 2021-08-01 NOTE — Progress Notes (Signed)
He presents today for follow-up of his posterior tibial tendinitis states that he was doing well for quite some time and then that shot seem to wear off and is starting to hurt again.  He states that he has changed shoes recently and he started to rub the end of his toes which may have resulted in some of the toenail pain that he was having of his left great hallux.  He also states that he still having the pain between his first and second metatarsophalangeal joints and third and fourth metatarsal phalangeal joints.  Objective: Vital signs stable alert oriented x3 third and fourth demonstrate some signs of a neuroma palpable Mulder's click capsulitis between the sesamoids and the second metatarsal phalangeal joint.  He also has some tenderness on palpation of the posterior tibial tendinous insertion of the navicular which is very prominent.  There is no fluctuance within the tendon sheath..  But there is some fluctuance overlying the navicular tuberosity most likely consistent with a bursitis.  Assessment: Bursitis right foot near posterior tibial tendon attachment also sesamoiditis capsulitis as well as a neuroma on the right foot.  Plan: Discussed etiology pathology to surgical therapy at this point I injected 10 mg Kenalog 5 mg Marcaine to the point of maximal tenderness third and fourth metatarsal space as well as the first intermetatarsal space.  I also injected dexamethasone into the fluctuant bursa of the right foot.  He tolerated procedure well evaluated the hallux no findings.

## 2021-08-08 DIAGNOSIS — M67432 Ganglion, left wrist: Secondary | ICD-10-CM | POA: Diagnosis not present

## 2021-08-10 NOTE — Progress Notes (Signed)
X °

## 2021-08-29 ENCOUNTER — Ambulatory Visit
Admission: RE | Admit: 2021-08-29 | Discharge: 2021-08-29 | Disposition: A | Discharge status: Home / Self Care | Payer: PPO | Attending: Physician Assistant | Admitting: Physician Assistant

## 2021-08-29 ENCOUNTER — Ambulatory Visit (INDEPENDENT_AMBULATORY_CARE_PROVIDER_SITE_OTHER): Payer: PPO | Admitting: Physician Assistant

## 2021-08-29 ENCOUNTER — Encounter: Payer: Self-pay | Admitting: Physician Assistant

## 2021-08-29 ENCOUNTER — Ambulatory Visit
Admission: RE | Admit: 2021-08-29 | Discharge: 2021-08-29 | Disposition: A | Discharge status: Home / Self Care | Payer: PPO | Source: Ambulatory Visit | Attending: Physician Assistant | Admitting: Physician Assistant

## 2021-08-29 ENCOUNTER — Other Ambulatory Visit: Payer: Self-pay

## 2021-08-29 ENCOUNTER — Ambulatory Visit: Payer: Self-pay

## 2021-08-29 VITALS — BP 96/52 | HR 89 | Temp 98.3°F | Ht 74.0 in | Wt 162.9 lb

## 2021-08-29 DIAGNOSIS — I959 Hypotension, unspecified: Secondary | ICD-10-CM

## 2021-08-29 DIAGNOSIS — R509 Fever, unspecified: Secondary | ICD-10-CM | POA: Diagnosis not present

## 2021-08-29 DIAGNOSIS — R918 Other nonspecific abnormal finding of lung field: Secondary | ICD-10-CM | POA: Diagnosis not present

## 2021-08-29 LAB — POCT URINALYSIS DIPSTICK
Bilirubin, UA: NEGATIVE
Blood, UA: NEGATIVE
Glucose, UA: NEGATIVE
Ketones, UA: NEGATIVE
Leukocytes, UA: NEGATIVE
Nitrite, UA: NEGATIVE
Protein, UA: NEGATIVE
Spec Grav, UA: 1.01 (ref 1.010–1.025)
Urobilinogen, UA: 0.2 E.U./dL
pH, UA: 7.5 (ref 5.0–8.0)

## 2021-08-29 NOTE — Telephone Encounter (Signed)
? ?  Chief Complaint: Dizziness - fever ?Symptoms: Light headed ?Frequency: since yesterday morning ?Pertinent Negatives: Patient denies SOB, weakness ?Disposition: '[]'$ ED /'[]'$ Urgent Care (no appt availability in office) / '[x]'$ Appointment(In office/virtual)/ '[]'$  [facility]/ '[]'$ Home Care/ '[]'$ Refused Recommended Disposition /'[]'$ [facility]/ '[]'$  Follow-up with PCP ?Additional Notes: Pt had EMS with him when he called. EMS saw no immediate reason to take him to the ED. Pt was light headed and had fever. Wife called EMS.   ? ? ? ?Reason for Disposition ? [1] MODERATE dizziness (e.g., vertigo; feels very unsteady, interferes with normal activities) AND [2] has NOT been evaluated by physician for this ? ?Answer Assessment - Initial Assessment Questions ?1. DESCRIPTION: "Describe your dizziness." ?    lightheaded ?2. VERTIGO: "Do you feel like either you or the room is spinning or tilting?"  ?    no ?3. LIGHTHEADED: "Do you feel lightheaded?" (e.g., somewhat faint, woozy, weak upon standing) ?    no ?4. SEVERITY: "How bad is it?"  "Can you walk?" ?  - MILD: Feels slightly dizzy and unsteady, but is walking normally. ?  - MODERATE: Feels unsteady when walking, but not falling; interferes with normal activities (e.g., school, work). ?  - SEVERE: Unable to walk without falling, or requires assistance to walk without falling. ?    mild ?5. ONSET:  "When did the dizziness begin?" ?    Yesterday morning ?6. AGGRAVATING FACTORS: "Does anything make it worse?" (e.g., standing, change in head position) ?    no ?7. CAUSE: "What do you think is causing the dizziness?" ?    unknown ?8. RECURRENT SYMPTOM: "Have you had dizziness before?" If Yes, ask: "When was the last time?" "What happened that time?" ?    no ?9. OTHER SYMPTOMS: "Do you have any other symptoms?" (e.g., headache, weakness, numbness, vomiting, earache) ?    Fever of 101 ?10. PREGNANCY: "Is there any chance you are pregnant?" "When was your last menstrual  period?" ?      na ? ?Protocols used: Dizziness - Vertigo-A-AH ? ?

## 2021-08-29 NOTE — Assessment & Plan Note (Addendum)
Temp in office normal ?UA neg leuk, blood. ?Up to 1000 mg of tylenol every 6-8 hours, no more than 4000 mg daily. ?CXR ordered ?CBC/CMP ? ?

## 2021-08-29 NOTE — Assessment & Plan Note (Addendum)
Advised pt w/ fever and hypotension, likely hypovolemic and should go to the ED for IV fluids and further eval.  ?Pt would prefer to stay home. ?Advised if he does not go to ED to increase fluid intake at home, even w/ electrolyte drinks like Gatorade/pedalyte.  ?Continue monitoring BP at home. ?If increased dizziness, shortness of breath, fevers > 101 despite tylenol, needs to go to ED. ?CXR ordered, CBC, CMP.  ?

## 2021-08-29 NOTE — Progress Notes (Signed)
? ?Acute Office Visit ? ?Subjective:  ? ? Patient ID: [name], male    DOB: [birth year], 81 y.o.   MRN: [id number] ? ?Chief Complaint  ?Patient presents with  ? Dizziness  ? ?Lynx is an 81 y/o male who presents today with fever, chills, and lightheadedness that started yesterday. These feelings progressed and his wife called EMS this morning. Pt states the didn't think he needed to go to the ED. Reports tmax of 101 on 1000 mg tylenol, last dose was 10 am this morning. Reports lightheadedness is constant, but better than yesterday.  ?Denies URI symptoms, abdominal pain, urinary symptoms, changes in BM, headaches, chest pain, SOB.  ? ?Past Medical History:  ?Diagnosis Date  ? AB (asthmatic bronchitis)   ? Allergic rhinitis   ? Arthropathy   ? Asthma   ? Cellulitis   ? DVT (deep venous thrombosis) ([place])   ? Foreign body in skin   ? GERD (gastroesophageal reflux disease)   ? Glaucoma   ? Hx MRSA infection   ? Hx of basal cell carcinoma 02/26/2018  ? L lat mid back  ? Hypertension   ? Resolved - no meds  ? Insomnia   ? Osteoarthrosis   ? Upper back strain   ? ? ?Past Surgical History:  ?Procedure Laterality Date  ? APPENDECTOMY    ? BRONCHOSCOPY    ? CATARACT EXTRACTION W/PHACO Left 09/01/2019  ? Procedure: CATARACT EXTRACTION PHACO AND INTRAOCULAR LENS PLACEMENT ([facility]) LEFT 11.72 01:12.2 16.2%;  Surgeon: Leandrew Koyanagi, MD;  Location: [facility];  Service: Ophthalmology;  Laterality: Left;  ? CATARACT EXTRACTION W/PHACO Right 09/22/2019  ? Procedure: CATARACT EXTRACTION PHACO AND INTRAOCULAR LENS PLACEMENT ([place]) RIGHT;  Surgeon: Leandrew Koyanagi, MD;  Location: [facility];  Service: Ophthalmology;  Laterality: Right;  6.88 ?103.4 ?10.9%  ? LEFT HEART CATH AND CORONARY ANGIOGRAPHY N/A 08/19/2016  ? Procedure: Left Heart Cath and Coronary Angiography;  Surgeon: Dionisio David, MD;  Location: [facility];  Service: Cardiovascular;  Laterality: N/A;  ? NASAL SINUS SURGERY  09/27/2014   ? ? ?Family History  ?Problem Relation Age of Onset  ? Breast cancer Sister   ? Heart failure Mother   ? Pancreatic cancer Sister   ? Alzheimer's disease Father   ? Brain cancer Daughter   ? ? ?Social History  ? ?Socioeconomic History  ? Marital status: Married  ?  Spouse name: Everlene Farrier  ? Number of children: 2  ? Years of education: College  ? Highest education level: Bachelor's degree (e.g., BA, AB, BS)  ?Occupational History  ? Occupation: Retired  ?  Employer: RETIRED  ?  Comment: Engineer  ?Tobacco Use  ? Smoking status: Former  ?  Packs/day: 0.50  ?  Years: 3.00  ?  Pack years: 1.50  ?  Types: Cigarettes  ?  Quit date: 06/17/1958  ?  Years since quitting: 63.2  ? Smokeless tobacco: Never  ? Tobacco comments:  ?  smoking while in college  ?Vaping Use  ? Vaping Use: Never used  ?Substance and Sexual Activity  ? Alcohol use: Yes  ?  Alcohol/week: 3.0 - 4.0 standard drinks  ?  Types: 3 - 4 Glasses of wine per week  ? Drug use: No  ? Sexual activity: Yes  ?Other Topics Concern  ? Not on file  ?Social History Narrative  ? He is a married father of 2 daughters.  ? He smoked casually while in college for about  3 years less than a pack a day.  ? He drinks 3-4 glass of wine at night. He also has about 2 servings of coffee a day  ? He is a very active gentleman with [employer] exercise program at least 3-4 days a week.  ? 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  ? In addition to this [name] his exercise, he does lots of gardening and uses a chain saw to cut wood.  ?   ?   ?   ? ?Social Determinants of Health  ? ?Financial Resource Strain: Low Risk   ? Difficulty of Paying Living Expenses: Not hard at all  ?Food Insecurity: No Food Insecurity  ? Worried About Charity fundraiser in the Last Year: Never true  ? Ran Out of Food in the Last Year: Never true  ?Transportation Needs: No Transportation Needs  ? Lack of Transportation (Medical): No  ? Lack of Transportation (Non-Medical): No   ?Physical Activity: Sufficiently Active  ? Days of Exercise per Week: 3 days  ? Minutes of Exercise per Session: 60 min  ?Stress: No Stress Concern Present  ? Feeling of Stress : Not at all  ?Social Connections: Socially Integrated  ? Frequency of Communication with Friends and Family: More than three times a week  ? Frequency of Social Gatherings with Friends and Family: More than three times a week  ? Attends Religious Services: More than 4 times per year  ? Active Member of Clubs or Organizations: Yes  ? Attends Archivist Meetings: More than 4 times per year  ? Marital Status: Married  ?Intimate Partner Violence: Not At Risk  ? Fear of Current or Ex-Partner: No  ? Emotionally Abused: No  ? Physically Abused: No  ? Sexually Abused: No  ? ? ?Outpatient Medications Prior to Visit  ?Medication Sig Dispense Refill  ? ADVAIR HFA 230-21 MCG/ACT inhaler Inhale 2 puffs into the lungs 2 (two) times daily.     ? albuterol (PROVENTIL HFA;VENTOLIN HFA) 108 (90 Base) MCG/ACT inhaler Inhale into the lungs every 6 (six) hours as needed for wheezing or shortness of breath.     ? azithromycin (ZITHROMAX) 250 MG tablet Take 250 mg by mouth daily.    ? brimonidine (ALPHAGAN) 0.2 % ophthalmic solution 1 drop 2 (two) times daily.    ? budesonide-formoterol (SYMBICORT) 160-4.5 MCG/ACT inhaler Inhale 2 puffs into the lungs 2 (two) times daily.    ? COVID-19 mRNA bivalent vaccine, Pfizer, (PFIZER COVID-19 VAC BIVALENT) injection Inject into the muscle. 0.3 mL 0  ? Crisaborole (EUCRISA) 2 % OINT Apply 1 application topically as directed. Qd to bid to aa eczema areas on arms and shoulders until clear, prn flares 100 g 3  ? diltiazem (CARDIZEM CD) 180 MG 24 hr capsule TAKE 1 CAPSULE BY MOUTH ONCE DAILY 90 capsule 1  ? dorzolamide (TRUSOPT) 2 % ophthalmic solution Place 1 drop into both eyes 2 (two) times daily.     ? finasteride (PROSCAR) 5 MG tablet TAKE 1 TABLET BY MOUTH DAILY 90 tablet 3  ? fluticasone (FLONASE) 50 MCG/ACT  nasal spray Place 2 sprays into both nostrils 2 (two) times daily.     ? ketoconazole (NIZORAL) 2 % shampoo Apply 1 application topically 2 (two) times a week. Wash trunk and arms 2 times weekly when showering, let sit 5-10 minutes before rinsing off 120 mL 4  ? latanoprost (XALATAN) 0.005 % ophthalmic solution Place 1 drop  into both eyes at bedtime.     ? Melatonin 5 MG TABS Take 1 tablet by mouth at bedtime.     ? mometasone (ELOCON) 0.1 % cream Apply to itchy areas of eczema on the arms and abdomen QD PRN. Avoid the face, groin, and axilla. 50 g 1  ? montelukast (SINGULAIR) 10 MG tablet Take by mouth.    ? omeprazole (PRILOSEC) 20 MG capsule TAKE 1 CAPSULE BY MOUTH DAILY 90 capsule 3  ? Ruxolitinib Phosphate (OPZELURA) 1.5 % CREA Apply 1 application topically daily. Apply it itchy areas and areas of vitiligo QD. 60 g 3  ? Sodium Chloride, Inhalant, 7 % NEBU Take 1 vial by nebulization 2 (two) times daily.    ? tamsulosin (FLOMAX) 0.4 MG CAPS capsule Take 2 capsules (0.8 mg total) by mouth daily. 180 capsule 3  ? triamcinolone (KENALOG) 0.1 % Apply 1 application topically 2 (two) times daily as needed. 80 g 2  ? triamcinolone ointment (KENALOG) 0.1 % Apply 1 application topically as directed. To cracks on fingers as needed    ? XARELTO 10 MG TABS tablet Take 10 mg by mouth daily.    ? tiotropium (SPIRIVA HANDIHALER) 18 MCG inhalation capsule Place 18 mcg into inhaler and inhale daily.     ? ?No facility-administered medications prior to visit.  ? ? ?Allergies  ?Allergen Reactions  ? Timolol Maleate [Timolol Maleate] Shortness Of Breath  ? Atorvastatin Swelling  ?  Swelling in legs, dizziness  ? Bactrim [Sulfamethoxazole-Trimethoprim]   ?  Pt says "it takes his skin pigment off  ? Budesonide-Formoterol Fumarate Other (See Comments)  ?  SYMBICORT Patient reports dizziness and trembling/shaky feeling.  ? Lisinopril Other (See Comments)  ?  cough  ? ? ?Review of Systems  ?Constitutional:  Positive for fatigue and  fever.  ?Respiratory:  Negative for cough and shortness of breath.   ?Cardiovascular:  Negative for chest pain, palpitations and leg swelling.  ?Neurological:  Positive for dizziness and light-headedness. Negative for he

## 2021-08-30 LAB — CBC WITH DIFFERENTIAL/PLATELET
Basophils Absolute: 0 10*3/uL (ref 0.0–0.2)
Basos: 0 %
EOS (ABSOLUTE): 0.1 10*3/uL (ref 0.0–0.4)
Eos: 1 %
Hematocrit: 44.3 % (ref 37.5–51.0)
Hemoglobin: 15.6 g/dL (ref 13.0–17.7)
Immature Grans (Abs): 0.1 10*3/uL (ref 0.0–0.1)
Immature Granulocytes: 1 %
Lymphocytes Absolute: 1.6 10*3/uL (ref 0.7–3.1)
Lymphs: 12 %
MCH: 33.2 pg — ABNORMAL HIGH (ref 26.6–33.0)
MCHC: 35.2 g/dL (ref 31.5–35.7)
MCV: 94 fL (ref 79–97)
Monocytes Absolute: 1.1 10*3/uL — ABNORMAL HIGH (ref 0.1–0.9)
Monocytes: 9 %
Neutrophils Absolute: 10 10*3/uL — ABNORMAL HIGH (ref 1.4–7.0)
Neutrophils: 77 %
Platelets: 199 10*3/uL (ref 150–450)
RBC: 4.7 x10E6/uL (ref 4.14–5.80)
RDW: 13.2 % (ref 11.6–15.4)
WBC: 12.9 10*3/uL — ABNORMAL HIGH (ref 3.4–10.8)

## 2021-08-30 LAB — COMPREHENSIVE METABOLIC PANEL
ALT: 21 IU/L (ref 0–44)
AST: 16 IU/L (ref 0–40)
Albumin/Globulin Ratio: 1.5 (ref 1.2–2.2)
Albumin: 3.4 g/dL — ABNORMAL LOW (ref 3.6–4.6)
Alkaline Phosphatase: 90 IU/L (ref 44–121)
BUN/Creatinine Ratio: 12 (ref 10–24)
BUN: 15 mg/dL (ref 8–27)
Bilirubin Total: 1.1 mg/dL (ref 0.0–1.2)
CO2: 24 mmol/L (ref 20–29)
Calcium: 8.5 mg/dL — ABNORMAL LOW (ref 8.6–10.2)
Chloride: 98 mmol/L (ref 96–106)
Creatinine, Ser: 1.3 mg/dL — ABNORMAL HIGH (ref 0.76–1.27)
Globulin, Total: 2.2 g/dL (ref 1.5–4.5)
Glucose: 111 mg/dL — ABNORMAL HIGH (ref 70–99)
Potassium: 4.2 mmol/L (ref 3.5–5.2)
Sodium: 136 mmol/L (ref 134–144)
Total Protein: 5.6 g/dL — ABNORMAL LOW (ref 6.0–8.5)
eGFR: 55 mL/min/{1.73_m2} — ABNORMAL LOW (ref >59)

## 2021-09-02 ENCOUNTER — Other Ambulatory Visit: Payer: Self-pay | Admitting: Family Medicine

## 2021-09-03 ENCOUNTER — Other Ambulatory Visit: Payer: Self-pay

## 2021-09-03 MED ORDER — XARELTO 10 MG PO TABS
10.0000 mg | ORAL_TABLET | Freq: Every day | ORAL | 12 refills | Status: DC
  Filled 2021-09-03: qty 30, 30d supply, fill #0

## 2021-09-06 DIAGNOSIS — M67432 Ganglion, left wrist: Secondary | ICD-10-CM | POA: Diagnosis not present

## 2021-09-17 ENCOUNTER — Ambulatory Visit: Payer: PPO | Admitting: Dermatology

## 2021-09-17 DIAGNOSIS — D1800 Hemangioma unspecified site: Secondary | ICD-10-CM

## 2021-09-17 DIAGNOSIS — D485 Neoplasm of uncertain behavior of skin: Secondary | ICD-10-CM

## 2021-09-17 DIAGNOSIS — D229 Melanocytic nevi, unspecified: Secondary | ICD-10-CM

## 2021-09-17 DIAGNOSIS — L258 Unspecified contact dermatitis due to other agents: Secondary | ICD-10-CM | POA: Diagnosis not present

## 2021-09-17 DIAGNOSIS — L570 Actinic keratosis: Secondary | ICD-10-CM

## 2021-09-17 DIAGNOSIS — B079 Viral wart, unspecified: Secondary | ICD-10-CM

## 2021-09-17 DIAGNOSIS — L2081 Atopic neurodermatitis: Secondary | ICD-10-CM

## 2021-09-17 DIAGNOSIS — Z1283 Encounter for screening for malignant neoplasm of skin: Secondary | ICD-10-CM

## 2021-09-17 DIAGNOSIS — L814 Other melanin hyperpigmentation: Secondary | ICD-10-CM | POA: Diagnosis not present

## 2021-09-17 DIAGNOSIS — L578 Other skin changes due to chronic exposure to nonionizing radiation: Secondary | ICD-10-CM

## 2021-09-17 DIAGNOSIS — R21 Rash and other nonspecific skin eruption: Secondary | ICD-10-CM | POA: Diagnosis not present

## 2021-09-17 DIAGNOSIS — Z85828 Personal history of other malignant neoplasm of skin: Secondary | ICD-10-CM | POA: Diagnosis not present

## 2021-09-17 DIAGNOSIS — B353 Tinea pedis: Secondary | ICD-10-CM

## 2021-09-17 DIAGNOSIS — L821 Other seborrheic keratosis: Secondary | ICD-10-CM

## 2021-09-17 DIAGNOSIS — H401122 Primary open-angle glaucoma, left eye, moderate stage: Secondary | ICD-10-CM | POA: Diagnosis not present

## 2021-09-17 MED ORDER — KETOCONAZOLE 2 % EX CREA: 1.0000 "application " | TOPICAL_CREAM | Freq: Every evening | CUTANEOUS | 11 refills | Status: DC | PRN

## 2021-09-17 MED ORDER — DESOXIMETASONE 0.25 % EX OINT: 1.0000 "application " | TOPICAL_OINTMENT | Freq: Two times a day (BID) | CUTANEOUS | 1 refills | Status: DC

## 2021-09-17 NOTE — Progress Notes (Signed)
? ?Follow-Up Visit ?  ?Subjective  ?[name] is a 81 y.o. male who presents for the following: Annual Exam (History of BCC - TBSE today), Other (Hard spot of left temple that seems to be growing./), and Follow-up (Atopic dermatitis of left upper arm - used 1 tube of [place] but insurance will not cover refills - still itching). ?The patient presents for Total-Body Skin Exam (TBSE) for skin cancer screening and mole check.  The patient has spots, moles and lesions to be evaluated, some may be new or changing and the patient has concerns that these could be cancer. ? ?The following portions of the chart were reviewed this encounter and updated as appropriate:  ? Tobacco  Allergies  Meds  Problems  Med Hx  Surg Hx  Fam Hx   ?  ?Review of Systems:  No other skin or systemic complaints except as noted in HPI or Assessment and Plan. ? ?Objective  ?Well appearing patient in no apparent distress; mood and affect are within normal limits. ? ?A full examination was performed including scalp, head, eyes, ears, nose, lips, neck, chest, axillae, abdomen, back, buttocks, bilateral upper extremities, bilateral lower extremities, hands, feet, fingers, toes, fingernails, and toenails. All findings within normal limits unless otherwise noted below. ? ?Bilateral feet ?Scale ? ?Left Temple ?0.6 cm cutaneous horn ? ?Left Upper Arm - Anterior ?Pink patch ? ?Face/ears (17) ?Erythematous thin papules/macules with gritty scale.  ? ? ?Assessment & Plan  ? ?History of Basal Cell Carcinoma of the Skin ?- No evidence of recurrence today ?- Recommend regular full body skin exams ?- Recommend daily broad spectrum sunscreen SPF 30+ to sun-exposed areas, reapply every 2 hours as needed.  ?- Call if any new or changing lesions are noted between office visits ? ?Lentigines ?- Scattered tan macules ?- Due to sun exposure ?- Benign-appearing, observe ?- Recommend daily broad spectrum sunscreen SPF 30+ to sun-exposed areas, reapply every 2  hours as needed. ?- Call for any changes ? ?Seborrheic Keratoses ?- Stuck-on, waxy, tan-brown papules and/or plaques  ?- Benign-appearing ?- Discussed benign etiology and prognosis. ?- Observe ?- Call for any changes ? ?Melanocytic Nevi ?- Tan-brown and/or pink-flesh-colored symmetric macules and papules ?- Benign appearing on exam today ?- Observation ?- Call clinic for new or changing moles ?- Recommend daily use of broad spectrum spf 30+ sunscreen to sun-exposed areas.  ? ?Hemangiomas ?- Red papules ?- Discussed benign nature ?- Observe ?- Call for any changes ? ?Actinic Damage ?- Chronic condition, secondary to cumulative UV/sun exposure ?- diffuse scaly erythematous macules with underlying dyspigmentation ?- Recommend daily broad spectrum sunscreen SPF 30+ to sun-exposed areas, reapply every 2 hours as needed.  ?- Staying in the shade or wearing long sleeves, sun glasses (UVA+UVB protection) and wide brim hats (4-inch brim around the entire circumference of the hat) are also recommended for sun protection.  ?- Call for new or changing lesions. ? ?Skin cancer screening performed today. ? ?Neoplasm of uncertain behavior of skin ?Left Temple ?Epidermal / dermal shaving ? ?Lesion diameter (cm):  0.6 ?Informed consent: discussed and consent obtained   ?Timeout: patient name, date of birth, surgical site, and procedure verified   ?Procedure prep:  Patient was prepped and draped in usual sterile fashion ?Prep type:  Isopropyl alcohol ?Anesthesia: the lesion was anesthetized in a standard fashion   ?Anesthetic:  1% lidocaine w/ epinephrine 1-100,000 buffered w/ 8.4% NaHCO3 ?Instrument used: flexible razor blade   ?Hemostasis achieved with: pressure, aluminum  chloride and electrodesiccation   ?Outcome: patient tolerated procedure well   ?Post-procedure details: sterile dressing applied and wound care instructions given   ?Dressing type: bandage and petrolatum   ? ?Destruction of lesion ?Complexity: extensive    ?Destruction method: electrodesiccation and curettage   ?Informed consent: discussed and consent obtained   ?Timeout:  patient name, date of birth, surgical site, and procedure verified ?Procedure prep:  Patient was prepped and draped in usual sterile fashion ?Prep type:  Isopropyl alcohol ?Anesthesia: the lesion was anesthetized in a standard fashion   ?Anesthetic:  1% lidocaine w/ epinephrine 1-100,000 buffered w/ 8.4% NaHCO3 ?Curettage performed in three different directions: Yes   ?Electrodesiccation performed over the curetted area: Yes   ?Lesion length (cm):  0.6 ?Lesion width (cm):  0.6 ?Margin per side (cm):  0.2 ?Final wound size (cm):  1 ?Hemostasis achieved with:  pressure and aluminum chloride ?Outcome: patient tolerated procedure well with no complications   ?Post-procedure details: sterile dressing applied and wound care instructions given   ?Dressing type: bandage and petrolatum   ? ?Specimen 1 - Surgical pathology ?Differential Diagnosis: Cutaneous horn R/O SCC ?Check Margins: No ? ?Rash ?Back; trunk ?Postinflammatory hypopigmentation secondary to Bactrim allergic reaction vs Vitiligo -  ?patient declines treatment today. ? ?Vitiligo is a chronic autoimmune condition which causes loss of skin pigment and is commonly seen on the face and may also involve areas of trauma like hands, elbows, knees, and ankles. There is no cure and it is difficult to treat.  Treatments include topical steroids and other topical anti-inflammatory ointments/creams and topical and oral Jak inhibitors.  Sometimes narrow band UV light therapy or Xtrac laser is helpful, both of which require twice weekly treatments for at least 3-6 months.  Antioxidant vitamins, such as Vitamins A,C,E,D, Folic Acid and B12 may be added to enhance treatment. ? ?Tinea pedis of both feet ?Bilateral feet ?Chronic and persistent condition with duration or expected duration over one year. Condition is symptomatic / bothersome to patient. Not to  goal. ?Ketoconazole 2% cream qhs ? ?ketoconazole (NIZORAL) 2 % cream - Bilateral feet ?Apply 1 application. topically at bedtime as needed for irritation. ? ?Atopic neurodermatitis with pruritus ?Vs contact dermatitis ?Left Upper Arm - Anterior ?Desoximetasone (TOPICORT) 0.25 % ointment - Left Upper Arm - Anterior ?Apply 1 application. topically 2 (two) times daily. To eczema of upper arm ? ?Atopic dermatitis (eczema) is a chronic, relapsing, pruritic condition that can significantly affect quality of life. It is often associated with allergic rhinitis and/or asthma and can require treatment with topical medications, phototherapy, or in severe cases biologic injectable medication (Dupixent; Adbry) or Oral JAK inhibitors. ? ?Related Medications ?Crisaborole (EUCRISA) 2 % OINT ?Apply 1 application topically as directed. Qd to bid to aa eczema areas on arms and shoulders until clear, prn flares ? ?AK (actinic keratosis) (17) ?Face/ears ?Destruction of lesion - Face/ears ?Complexity: simple   ?Destruction method: cryotherapy   ?Informed consent: discussed and consent obtained   ?Timeout:  patient name, date of birth, surgical site, and procedure verified ?Lesion destroyed using liquid nitrogen: Yes   ?Region frozen until ice ball extended beyond lesion: Yes   ?Outcome: patient tolerated procedure well with no complications   ?Post-procedure details: wound care instructions given   ? ?Return in about 6 months (around 03/19/2022). ? ?I, Ashok Cordia, CMA, am acting as scribe for Sarina Ser, MD . ?Documentation: I have reviewed the above documentation for accuracy and completeness, and I agree with the above. ? ?  Sarina Ser, MD ? ?

## 2021-09-17 NOTE — Patient Instructions (Signed)
Cryotherapy Aftercare  Wash gently with soap and water everyday.   Apply Vaseline and Band-Aid daily until healed.    Wound Care Instructions  Cleanse wound gently with soap and water once a day then pat dry with clean gauze. Apply a thing coat of Petrolatum (petroleum jelly, "Vaseline") over the wound (unless you have an allergy to this). We recommend that you use a new, sterile tube of Vaseline. Do not pick or remove scabs. Do not remove the yellow or white "healing tissue" from the base of the wound.  Cover the wound with fresh, clean, nonstick gauze and secure with paper tape. You may use Band-Aids in place of gauze and tape if the would is small enough, but would recommend trimming much of the tape off as there is often too much. Sometimes Band-Aids can irritate the skin.  You should call the office for your biopsy report after 1 week if you have not already been contacted.  If you experience any problems, such as abnormal amounts of bleeding, swelling, significant bruising, significant pain, or evidence of infection, please call the office immediately.  FOR ADULT SURGERY PATIENTS: If you need something for pain relief you may take 1 extra strength Tylenol (acetaminophen) AND 2 Ibuprofen (200mg each) together every 4 hours as needed for pain. (do not take these if you are allergic to them or if you have a reason you should not take them.) Typically, you may only need pain medication for 1 to 3 days.        If You Need Anything After Your Visit  If you have any questions or concerns for your doctor, please call our main line at [phone] and press option 4 to reach your doctor's medical assistant. If no one answers, please leave a voicemail as directed and we will return your call as soon as possible. Messages left after 4 pm will be answered the following business day.   You may also send us a message via MyChart. We typically respond to MyChart messages within 1-2 business  days.  For prescription refills, please ask your pharmacy to contact our office. Our fax number is [phone].  If you have an urgent issue when the clinic is closed that cannot wait until the next business day, you can page your doctor at the number below.    Please note that while we do our best to be available for urgent issues outside of office hours, we are not available 24/7.   If you have an urgent issue and are unable to reach us, you may choose to seek medical care at your doctor's office, retail clinic, urgent care center, or emergency room.  If you have a medical emergency, please immediately call 911 or go to the emergency department.  Pager Numbers  - Dr. Kowalski: [phone]  - Dr. Moye: [phone]  - Dr. Stewart: [phone]  In the event of inclement weather, please call our main line at [phone] for an update on the status of any delays or closures.  Dermatology Medication Tips: Please keep the boxes that topical medications come in in order to help keep track of the instructions about where and how to use these. Pharmacies typically print the medication instructions only on the boxes and not directly on the medication tubes.   If your medication is too expensive, please contact our office at [phone] option 4 or send us a message through MyChart.   We are unable to tell what your co-pay for medications will be   in advance as this is different depending on your insurance coverage. However, we may be able to find a substitute medication at lower cost or fill out paperwork to get insurance to cover a needed medication.   If a prior authorization is required to get your medication covered by your insurance company, please allow us 1-2 business days to complete this process.  Drug prices often vary depending on where the prescription is filled and some pharmacies may offer cheaper prices.  The website www.goodrx.com contains coupons for medications through  different pharmacies. The prices here do not account for what the cost may be with help from insurance (it may be cheaper with your insurance), but the website can give you the price if you did not use any insurance.  - You can print the associated coupon and take it with your prescription to the pharmacy.  - You may also stop by our office during regular business hours and pick up a GoodRx coupon card.  - If you need your prescription sent electronically to a different pharmacy, notify our office through [facility] MyChart or by phone at [phone] option 4.     Si Usted Necesita Algo Despus de Su Visita  Tambin puede enviarnos un mensaje a travs de MyChart. Por lo general respondemos a los mensajes de MyChart en el transcurso de 1 a 2 das hbiles.  Para renovar recetas, por favor pida a su farmacia que se ponga en contacto con nuestra oficina. Nuestro nmero de fax es el [phone].  Si tiene un asunto urgente cuando la clnica est cerrada y que no puede esperar hasta el siguiente da hbil, puede llamar/localizar a su doctor(a) al nmero que aparece a continuacin.   Por favor, tenga en cuenta que aunque hacemos todo lo posible para estar disponibles para asuntos urgentes fuera del horario de oficina, no estamos disponibles las 24 horas del da, los 7 das de la semana.   Si tiene un problema urgente y no puede comunicarse con nosotros, puede optar por buscar atencin mdica  en el consultorio de su doctor(a), en una clnica privada, en un centro de atencin urgente o en una sala de emergencias.  Si tiene una emergencia mdica, por favor llame inmediatamente al 911 o vaya a la sala de emergencias.  Nmeros de bper  - Dr. Kowalski: [phone]  - Dra. Moye: [phone]  - Dra. Stewart: [phone]  En caso de inclemencias del tiempo, por favor llame a nuestra lnea principal al [phone] para una actualizacin sobre el estado de cualquier retraso o cierre.  Consejos  para la medicacin en dermatologa: Por favor, guarde las cajas en las que vienen los medicamentos de uso tpico para ayudarle a seguir las instrucciones sobre dnde y cmo usarlos. Las farmacias generalmente imprimen las instrucciones del medicamento slo en las cajas y no directamente en los tubos del medicamento.   Si su medicamento es muy caro, por favor, pngase en contacto con nuestra oficina llamando al [phone] y presione la opcin 4 o envenos un mensaje a travs de MyChart.   No podemos decirle cul ser su copago por los medicamentos por adelantado ya que esto es diferente dependiendo de la cobertura de su seguro. Sin embargo, es posible que podamos encontrar un medicamento sustituto a menor costo o llenar un formulario para que el seguro cubra el medicamento que se considera necesario.   Si se requiere una autorizacin previa para que su compaa de seguros cubra su medicamento, por favor permtanos de 1 a   2 das hbiles para completar este proceso.  Los precios de los medicamentos varan con frecuencia dependiendo del lugar de dnde se surte la receta y alguna farmacias pueden ofrecer precios ms baratos.  El sitio web www.goodrx.com tiene cupones para medicamentos de diferentes farmacias. Los precios aqu no tienen en cuenta lo que podra costar con la ayuda del seguro (puede ser ms barato con su seguro), pero el sitio web puede darle el precio si no utiliz ningn seguro.  - Puede imprimir el cupn correspondiente y llevarlo con su receta a la farmacia.  - Tambin puede pasar por nuestra oficina durante el horario de atencin regular y recoger una tarjeta de cupones de GoodRx.  - Si necesita que su receta se enve electrnicamente a una farmacia diferente, informe a nuestra oficina a travs de MyChart de [place] o por telfono llamando al [phone] y presione la opcin 4.  

## 2021-09-18 ENCOUNTER — Encounter: Payer: Self-pay | Admitting: Dermatology

## 2021-09-18 DIAGNOSIS — J410 Simple chronic bronchitis: Secondary | ICD-10-CM | POA: Diagnosis not present

## 2021-09-18 DIAGNOSIS — I25810 Atherosclerosis of coronary artery bypass graft(s) without angina pectoris: Secondary | ICD-10-CM | POA: Diagnosis not present

## 2021-09-18 DIAGNOSIS — Z8679 Personal history of other diseases of the circulatory system: Secondary | ICD-10-CM | POA: Diagnosis not present

## 2021-09-18 DIAGNOSIS — R053 Chronic cough: Secondary | ICD-10-CM | POA: Diagnosis not present

## 2021-09-19 ENCOUNTER — Telehealth: Payer: Self-pay

## 2021-09-19 NOTE — Telephone Encounter (Signed)
-----   Message from Ralene Bathe, MD sent at 09/18/2021  6:01 PM EDT ----- ?Diagnosis ?Skin , left temple ?VERRUCA VULGARIS, IRRITATED ? ?Benign viral wart ?May recur ?No further treatment at this time. ?Recheck at next visit. ?

## 2021-09-19 NOTE — Telephone Encounter (Signed)
Patient advised of BX results .aw 

## 2021-09-19 NOTE — Telephone Encounter (Signed)
Left message for patient to call office for results/hd 

## 2021-09-25 DIAGNOSIS — I8290 Acute embolism and thrombosis of unspecified vein: Secondary | ICD-10-CM | POA: Diagnosis not present

## 2021-09-26 DIAGNOSIS — R768 Other specified abnormal immunological findings in serum: Secondary | ICD-10-CM | POA: Diagnosis not present

## 2021-09-26 DIAGNOSIS — J471 Bronchiectasis with (acute) exacerbation: Secondary | ICD-10-CM | POA: Diagnosis not present

## 2021-09-26 DIAGNOSIS — J4550 Severe persistent asthma, uncomplicated: Secondary | ICD-10-CM | POA: Diagnosis not present

## 2021-09-26 DIAGNOSIS — J479 Bronchiectasis, uncomplicated: Secondary | ICD-10-CM | POA: Diagnosis not present

## 2021-09-26 DIAGNOSIS — J449 Chronic obstructive pulmonary disease, unspecified: Secondary | ICD-10-CM | POA: Diagnosis not present

## 2021-09-26 DIAGNOSIS — R0989 Other specified symptoms and signs involving the circulatory and respiratory systems: Secondary | ICD-10-CM | POA: Diagnosis not present

## 2021-09-29 ENCOUNTER — Other Ambulatory Visit: Payer: Self-pay | Admitting: Specialist

## 2021-09-29 NOTE — H&P (Signed)
PREOPERATIVE H&P ? ?Chief Complaint: [phone] Ganglion, left wrist ? ?HPI: ?[name] is a 81 y.o. male who presents for preoperative history and physical with a diagnosis of M67.432 Ganglion, left wrist.  He has failed splinting and use of ice and heat.  Symptoms are rated as moderate to severe, and have been worsening.  This is significantly impairing activities of daily living.  He has elected for surgical management.  ? ?Past Medical History:  ?Diagnosis Date  ? AB (asthmatic bronchitis)   ? Allergic rhinitis   ? Arthropathy   ? Asthma   ? Cellulitis   ? DVT (deep venous thrombosis) ([place])   ? Foreign body in skin   ? GERD (gastroesophageal reflux disease)   ? Glaucoma   ? Hx MRSA infection   ? Hx of basal cell carcinoma 02/26/2018  ? L lat mid back  ? Hypertension   ? Resolved - no meds  ? Insomnia   ? Osteoarthrosis   ? Upper back strain   ? ?Past Surgical History:  ?Procedure Laterality Date  ? APPENDECTOMY    ? BRONCHOSCOPY    ? CATARACT EXTRACTION W/PHACO Left 09/01/2019  ? Procedure: CATARACT EXTRACTION PHACO AND INTRAOCULAR LENS PLACEMENT ([facility]) LEFT 11.72 01:12.2 16.2%;  Surgeon: Leandrew Koyanagi, MD;  Location: [facility];  Service: Ophthalmology;  Laterality: Left;  ? CATARACT EXTRACTION W/PHACO Right 09/22/2019  ? Procedure: CATARACT EXTRACTION PHACO AND INTRAOCULAR LENS PLACEMENT ([place]) RIGHT;  Surgeon: Leandrew Koyanagi, MD;  Location: [facility];  Service: Ophthalmology;  Laterality: Right;  6.88 ?103.4 ?10.9%  ? LEFT HEART CATH AND CORONARY ANGIOGRAPHY N/A 08/19/2016  ? Procedure: Left Heart Cath and Coronary Angiography;  Surgeon: Dionisio David, MD;  Location: [facility];  Service: Cardiovascular;  Laterality: N/A;  ? NASAL SINUS SURGERY  09/27/2014  ? ?Social History  ? ?Socioeconomic History  ? Marital status: Married  ?  Spouse name: Everlene Farrier  ? Number of children: 2  ? Years of education: College  ? Highest education level: Bachelor's degree (e.g., BA, AB, BS)   ?Occupational History  ? Occupation: Retired  ?  Employer: RETIRED  ?  Comment: Engineer  ?Tobacco Use  ? Smoking status: Former  ?  Packs/day: 0.50  ?  Years: 3.00  ?  Pack years: 1.50  ?  Types: Cigarettes  ?  Quit date: 06/17/1958  ?  Years since quitting: 63.3  ? Smokeless tobacco: Never  ? Tobacco comments:  ?  smoking while in college  ?Vaping Use  ? Vaping Use: Never used  ?Substance and Sexual Activity  ? Alcohol use: Yes  ?  Alcohol/week: 3.0 - 4.0 standard drinks  ?  Types: 3 - 4 Glasses of wine per week  ? Drug use: No  ? Sexual activity: Yes  ?Other Topics Concern  ? Not on file  ?Social History Narrative  ? He is a married father of 2 daughters.  ? He smoked casually while in college for about 3 years less than a pack a day.  ? He drinks 3-4 glass of wine at night. He also has about 2 servings of coffee a day  ? He is a very active gentleman with Silver Sne[employer] exercise program at least 3-4 days a week.  ? 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  ? In addition to this standing his exercise, he does lots of gardening and uses a chain saw to cut wood.  ?   ?   ?   ? ?  Social Determinants of Health  ? ?Financial Resource Strain: Low Risk   ? Difficulty of Paying Living Expenses: Not hard at all  ?Food Insecurity: No Food Insecurity  ? Worried About Charity fundraiser in the Last Year: Never true  ? Ran Out of Food in the Last Year: Never true  ?Transportation Needs: No Transportation Needs  ? Lack of Transportation (Medical): No  ? Lack of Transportation (Non-Medical): No  ?Physical Activity: Sufficiently Active  ? Days of Exercise per Week: 3 days  ? Minutes of Exercise per Session: 60 min  ?Stress: No Stress Concern Present  ? Feeling of Stress : Not at all  ?Social Connections: Socially Integrated  ? Frequency of Communication with Friends and Family: More than three times a week  ? Frequency of Social Gatherings with Friends and Family: More than three times a week  ? Attends  Religious Services: More than 4 times per year  ? Active Member of Clubs or Organizations: Yes  ? Attends Archivist Meetings: More than 4 times per year  ? Marital Status: Married  ? ?Family History  ?Problem Relation Age of Onset  ? Breast cancer Sister   ? Heart failure Mother   ? Pancreatic cancer Sister   ? Alzheimer's disease Father   ? Brain cancer Daughter   ? ?Allergies  ?Allergen Reactions  ? Timolol Maleate [Timolol Maleate] Shortness Of Breath  ? Atorvastatin Swelling  ?  Swelling in legs, dizziness  ? Bactrim [Sulfamethoxazole-Trimethoprim]   ?  Pt says "it takes his skin pigment off  ? Budesonide-Formoterol Fumarate Other (See Comments)  ?  SYMBICORT Patient reports dizziness and trembling/shaky feeling.  ? Lisinopril Other (See Comments)  ?  cough  ? ?Prior to Admission medications   ?Medication Sig Start Date End Date Taking? Authorizing Provider  ?ADVAIR HFA 230-21 MCG/ACT inhaler Inhale 2 puffs into the lungs 2 (two) times daily.  11/17/15   [provider]  ?albuterol (PROVENTIL HFA;VENTOLIN HFA) 108 (90 Base) MCG/ACT inhaler Inhale into the lungs every 6 (six) hours as needed for wheezing or shortness of breath.     [provider]  ?azithromycin (ZITHROMAX) 250 MG tablet Take 250 mg by mouth daily. 07/30/21   [provider]  ?brimonidine (ALPHAGAN) 0.2 % ophthalmic solution 1 drop 2 (two) times daily. 01/04/21   [provider]  ?budesonide-formoterol (SYMBICORT) 160-4.5 MCG/ACT inhaler Inhale 2 puffs into the lungs 2 (two) times daily. 07/17/21   [provider]  ?COVID-19 mRNA bivalent vaccine, Pfizer, (PFIZER COVID-19 VAC BIVALENT) injection Inject into the muscle. 03/16/21   Carlyle Basques, MD  ?Stasia Cavalier (EUCRISA) 2 % OINT Apply 1 application topically as directed. Qd to bid to aa eczema areas on arms and shoulders until clear, prn flares 06/21/21   Ralene Bathe, MD  ?Desoximetasone (TOPICORT) 0.25 % ointment Apply 1 application.  topically 2 (two) times daily. To eczema of upper arm 09/17/21   Ralene Bathe, MD  ?diltiazem [facility] CD) 180 MG 24 hr capsule TAKE 1 CAPSULE BY MOUTH ONCE DAILY 06/04/21   Birdie Sons, MD  ?dorzolamide (TRUSOPT) 2 % ophthalmic solution Place 1 drop into both eyes 2 (two) times daily.  08/31/15   [provider]  ?finasteride (PROSCAR) 5 MG tablet TAKE 1 TABLET BY MOUTH DAILY 07/24/21   Jerrol Banana., MD  ?fluticasone [facility]) 50 MCG/ACT nasal spray Place 2 sprays into both nostrils 2 (two) times daily.  06/29/18   [provider]  ?ketoconazole (NIZORAL) 2 % cream Apply 1 application. topically at bedtime as needed for irritation. 09/17/21 09/07/23  Ralene Bathe, MD  ?ketoconazole (NIZORAL) 2 % shampoo Apply 1 application topically 2 (two) times a week. Wash trunk and arms 2 times weekly when showering, let sit 5-10 minutes before rinsing off 06/25/21   Ralene Bathe, MD  ?latanoprost (XALATAN) 0.005 % ophthalmic solution Place 1 drop into both eyes at bedtime.     [provider]  ?Melatonin 5 MG TABS Take 1 tablet by mouth at bedtime.     [provider]  ?montelukast (SINGULAIR) 10 MG tablet Take by mouth. 01/05/21   [provider]  ?omeprazole (PRILOSEC) 20 MG capsule TAKE 1 CAPSULE BY MOUTH DAILY 03/12/21   Jerrol Banana., MD  ?Ruxolitinib Phosphate (OPZELURA) 1.5 % CREA Apply 1 application topically daily. Apply it itchy areas and areas of vitiligo QD. 03/27/21   Ralene Bathe, MD  ?Sodium Chloride, Inhalant, 7 % NEBU Take 1 vial by nebulization 2 (two) times daily. 01/22/21   [provider]  ?tamsulosin (FLOMAX) 0.4 MG CAPS capsule Take 2 capsules (0.8 mg total) by mouth daily. 05/28/21   Bjorn Loser, MD  ?tiotropium (SPIRIVA HANDIHALER) 18 MCG inhalation capsule Place 18 mcg into inhaler and inhale daily.  02/06/15 12/22/19  [provider]  ?triamcinolone (KENALOG) 0.1 % Apply 1 application topically 2 (two)  times daily as needed. 05/24/20   Brendolyn Patty, MD  ?Alveda Reasons 10 MG TABS tablet Take 1 tablet (10 mg total) by mouth daily. 09/03/21   Jerrol Banana., MD  ? ? ? ?Positive ROS: All other systems have been r

## 2021-10-11 DIAGNOSIS — H401113 Primary open-angle glaucoma, right eye, severe stage: Secondary | ICD-10-CM | POA: Diagnosis not present

## 2021-10-12 ENCOUNTER — Encounter
Admission: RE | Admit: 2021-10-12 | Discharge: 2021-10-12 | Disposition: A | Discharge status: Home / Self Care | Payer: PPO | Source: Ambulatory Visit | Attending: Specialist | Admitting: Specialist

## 2021-10-12 HISTORY — DX: Chronic obstructive pulmonary disease, unspecified: J44.9

## 2021-10-12 HISTORY — DX: Atherosclerotic heart disease of native coronary artery without angina pectoris: I25.10

## 2021-10-12 HISTORY — DX: Paroxysmal tachycardia, unspecified: I47.9

## 2021-10-12 HISTORY — DX: Dyspnea, unspecified: R06.00

## 2021-10-12 HISTORY — DX: Headache, unspecified: R51.9

## 2021-10-12 HISTORY — DX: Pneumonia, unspecified organism: J18.9

## 2021-10-12 HISTORY — DX: Allergic bronchopulmonary aspergillosis: B44.81

## 2021-10-12 HISTORY — DX: Personal history of other infectious and parasitic diseases: Z86.19

## 2021-10-12 HISTORY — DX: Abnormal levels of other serum enzymes: R74.8

## 2021-10-12 HISTORY — DX: Diverticulitis of intestine, part unspecified, without perforation or abscess without bleeding: K57.92

## 2021-10-12 HISTORY — DX: Rheumatic mitral valve disease, unspecified: I05.9

## 2021-10-12 HISTORY — DX: Ventricular premature depolarization: I49.3

## 2021-10-12 HISTORY — DX: Acute embolism and thrombosis of unspecified femoral vein: I82.419

## 2021-10-12 NOTE — Patient Instructions (Signed)
Your procedure is scheduled on:10-19-21 Friday ?Report to the Registration Desk on the 1st floor of the [facility].Then proceed to the 2nd floor Surgery Desk ?To find out your arrival time, please call [phone] between 1PM - 3PM on:10-18-21 Thursday ?If your arrival time is 6:00 am, do not arrive prior to that time as the [facility] entrance doors do not open until 6:00 am. ? ?REMEMBER: ?Instructions that are not followed completely may result in serious medical risk, up to and including death; or upon the discretion of your surgeon and anesthesiologist your surgery may need to be rescheduled. ? ?Do not eat food OR drink any liquids after midnight the night before surgery.  ?No gum chewing, lozengers or hard candies. ? ?Do NOT take any medication the day of surgery ? ?Stop your XARELTO 3 days prior to surgery per your Hematologist-Last dose will be 10-15-21 ? ?Use your albuterol (PROVENTIL) Nebulizer and fluticasone-salmeterol [facility]) Inhaler the day of surgery and bring your Albuterol Inhaler the day of surgery ? ?One week prior to surgery: ?Stop Anti-inflammatories (NSAIDS) such as Advil, Aleve, Ibuprofen, Motrin, Naproxen, Naprosyn and Aspirin based products such as Excedrin, Goodys Powder, BC Powder. You may however, take Tylenol if needed for pain up until the day of surgery. ? ?Stop ANY OVER THE COUNTER supplements/vitamins NOW (10-12-21) until after surgery-Ok to continue your Melatonin up until the day prior to surgery ? ?No Alcohol for 24 hours before or after surgery. ? ?No Smoking including e-cigarettes for 24 hours prior to surgery.  ?No chewable tobacco products for at least 6 hours prior to surgery.  ?No nicotine patches on the day of surgery. ? ?Do not use any "recreational" drugs for at least a week prior to your surgery.  ?Please be advised that the combination of cocaine and anesthesia may have negative outcomes, up to and including death. ?If you test positive for cocaine, your surgery will be  cancelled. ? ?On the morning of surgery brush your teeth with toothpaste and water, you may rinse your mouth with mouthwash if you wish. ?Do not swallow any toothpaste or mouthwash. ? ?Use CHG Soap as directed on instruction sheet. ? ?Do not wear jewelry, make-up, hairpins, clips or nail polish. ? ?Do not wear lotions, powders, or perfumes.  ? ?Do not shave body from the neck down 48 hours prior to surgery just in case you cut yourself which could leave a site for infection.  ?Also, freshly shaved skin may become irritated if using the CHG soap. ? ?Contact lenses, hearing aids and dentures may not be worn into surgery. ? ?Do not bring valuables to the hospital. [facility] is not responsible for any missing/lost belongings or valuables.  ? ?Notify your doctor if there is any change in your medical condition (cold, fever, infection). ? ?Wear comfortable clothing (specific to your surgery type) to the hospital. ? ?After surgery, you can help prevent lung complications by doing breathing exercises.  ?Take deep breaths and cough every 1-2 hours. Your doctor may order a device called an Incentive Spirometer to help you take deep breaths. ?When coughing or sneezing, hold a pillow firmly against your incision with both hands. This is called ?splinting.? Doing this helps protect your incision. It also decreases belly discomfort. ? ?If you are being admitted to the hospital overnight, leave your suitcase in the car. ?After surgery it may be brought to your room. ? ?If you are being discharged the day of surgery, you will not be allowed to  drive home. ?You will need a responsible adult (18 years or older) to drive you home and stay with you that night.  ? ?If you are taking public transportation, you will need to have a responsible adult (18 years or older) with you. ?Please confirm with your physician that it is acceptable to use public transportation.  ? ?Please call the [facility] Dept. at [phone] if you  have any questions about these instructions. ? ?Surgery Visitation Policy: ? ?Patients undergoing a surgery or procedure may have two family members or support persons with them as long as the person is not COVID-19 positive or experiencing its symptoms.  ?

## 2021-10-15 ENCOUNTER — Other Ambulatory Visit: Payer: Self-pay

## 2021-10-15 ENCOUNTER — Encounter: Payer: Self-pay | Admitting: Specialist

## 2021-10-15 ENCOUNTER — Encounter
Admission: RE | Admit: 2021-10-15 | Discharge: 2021-10-15 | Disposition: A | Discharge status: Home / Self Care | Payer: PPO | Source: Ambulatory Visit | Attending: Specialist | Admitting: Specialist

## 2021-10-15 DIAGNOSIS — J449 Chronic obstructive pulmonary disease, unspecified: Secondary | ICD-10-CM | POA: Diagnosis not present

## 2021-10-15 DIAGNOSIS — I10 Essential (primary) hypertension: Secondary | ICD-10-CM | POA: Diagnosis not present

## 2021-10-15 DIAGNOSIS — Z01810 Encounter for preprocedural cardiovascular examination: Secondary | ICD-10-CM | POA: Insufficient documentation

## 2021-10-15 DIAGNOSIS — I2510 Atherosclerotic heart disease of native coronary artery without angina pectoris: Secondary | ICD-10-CM | POA: Insufficient documentation

## 2021-10-15 MED ORDER — TAMSULOSIN HCL 0.4 MG PO CAPS: 0.8000 mg | ORAL_CAPSULE | Freq: Every day | ORAL | 3 refills | Status: AC

## 2021-10-15 NOTE — Progress Notes (Signed)
?Perioperative Services ? ?Pre-Admission/Anesthesia Testing Clinical Review ? ?Date: 10/18/21 ? ?Patient Demographics:  ?Name: [name] ?DOB:   1941/05/29 ?MRN:   [id number] ? ?Planned Surgical Procedure(s):  ? ? Case: [id number] Date/Time: 10/19/21 0715  ? Procedure: REMOVAL GANGLION OF WRIST (Left: Wrist)  ? Anesthesia type: General  ? Pre-op diagnosis: M67.432 Ganglion, left wrist  ? Location: [facility] OR ROOM 02 / [facility]S FOR ANEST[facility]ES[facility]  ? Surgeons: Earnestine Leys, MD  ? ?NOTE: Available PAT nursing documentation and vital signs have been reviewed. Clinical nursing staff has updated patient's PMH/PSHx, current medication list, and drug allergies/intolerances to ensure comprehensive history available to assist in medical decision making as it pertains to the aforementioned surgical procedure and anticipated anesthetic course. Extensive review of available clinical information performed. [place] PMH and PSHx updated with any diagnoses/procedures that  may have been inadvertently omitted during his intake with the pre-admission testing department's nursing staff. ? ?Clinical Discussion:  ?[name] is a 81 y.o. male who is submitted for pre-surgical anesthesia review and clearance prior to him undergoing the above procedure. Patient is a Former Smoker (1.5 pack years; quit 06/1958). Pertinent PMH includes: CAD, NSTEMI, diastolic dysfunction, PVCs, paroxysmal tachycardia, aortic atherosclerosis, DVT, HTN, HLD, COPD, asthma, allergic bronchopulmonary aspergillosis, GERD (no daily Tx), OA, MRSA, insomnia. ? ?Patient is followed by cardiology Ubaldo Glassing, MD). He was last seen in the cardiology clinic on 11/23/2020; notes reviewed.  At the time of his clinic visit, patient doing well overall from a cardiovascular perspective.  He denied any episodes of chest pain, shortness of breath, PND, orthopnea, palpitations, significant peripheral edema, vertiginous symptoms, or presyncope/syncope.  Past medical history  significant for cardiovascular diagnoses. ? ?Patient suffered an NSTEMI on 08/18/2016.  A normal left ventricular systolic function with an EF of 50%.  There was inferior myocardial hypokinesis noted.  There was mild BAE and mild RV enlargement. Diastolic Doppler parameters consistent with abnormal relaxation (G1DD). He underwent diagnostic left heart catheterization on 08/19/2016 revealing single-vessel nonocclusive CAD; 50% stenosis of the proximal RCA.  Intervention was deferred opting for medical management. ? ?Most recent TTE was performed on 11/25/2016 revealing a normal left ventricular systolic function with an EF of 53%.  There was mild left atrial and severe right atrial enlargement.  There was trace aortic, mild tricuspid and mitral, and mild to moderate pulmonic valve regurgitation.  There was no evidence of a significant transvalvular gradient to suggest stenosis. ? ?Patient with a history of DVT and remains on daily anticoagulation therapy using rivaroxaban.  Patient reported to be compliant with therapy with no evidence or reports of GI bleeding.  Blood pressure well controlled at 120/72 on currently prescribed CCB monotherapy.  Patient is not taking any type of lipid-lowering therapies for his HLD or further ASCVD prevention.  He is not diabetic.  Patient reported to be quite active with regular exercise in the hospital gym. Functional capacity, as defined by DASI, is documented as being >/= 4 METS.  No changes were made to his medication regimen.  Patient to follow-up with outpatient cardiology in 1 year or sooner if needed. ? ?[name] is scheduled for an elective REMOVAL GANGLION FROM LEFT WRIST on 10/19/2021 with Dr. Earnestine Leys, MD.  Given patient's past medical history significant for cardiovascular diagnoses, presurgical cardiac clearance was sought by the PAT team. Per cardiology, "this patient is optimized for surgery and may proceed with the planned procedural course with a LOW risk  of significant perioperative  cardiovascular complications".  Again, patient on daily anticoagulation therapy.  He has been instructed on recommendations from his hematologist and cardiologist for holding this medication for 3 days prior to his procedure with plans to restart as soon as postoperative bleeding risk felt to be minimized by his primary attending surgeon.  The patient is aware that his last dose of rivaroxaban should be on 10/15/2021. ? ?Patient denies previous perioperative complications with anesthesia in the past. In review of the available records, it is noted that patient underwent a MAC anesthetic course at [facility] (ASA III) in 09/2019 without documented complications.  ? ? ?  08/29/2021  ?  1:33 PM 08/29/2021  ?  1:15 PM 07/18/2021  ?  9:06 AM  ?Vitals with BMI  ?Height  _0  _1   ?Weight  162 lbs 14 oz 162 lbs 6 oz  ?BMI  20.91 21.43  ?Systolic 96 94 161  ?Diastolic 52 53 69  ?Pulse 89 85 63  ? ? ?Providers/Specialists:  ? ?NOTE: Primary physician provider listed below. Patient may have been seen by APP or partner within same practice.  ? ?PROVIDER ROLE / SPECIALTY LAST OV  ?Earnestine Leys, MD Orthopedics (Surgeon) ???  ?Jerrol Banana., MD Primary Care Provider 08/29/2021  ?Bartholome Bill, MD Cardiology 11/23/2020  ?Odis Hollingshead, MD Hematology 09/25/2021  ?Hulan Fess, MD Pulmonary Medicine 09/26/2021  ? ?Allergies:  ?Timolol maleate [timolol maleate], Atorvastatin, Bactrim [sulfamethoxazole-trimethoprim], Lisinopril, and Symbicort [budesonide-formoterol fumarate] ? ?Current Home Medications:  ? ?No current facility-administered medications for this encounter.  ? ? albuterol (PROVENTIL HFA;VENTOLIN HFA) 108 (90 Base) MCG/ACT inhaler  ? albuterol (PROVENTIL) (2.5 MG/3ML) 0.083% nebulizer solution  ? azithromycin (ZITHROMAX) 250 MG tablet  ? brimonidine (ALPHAGAN) 0.15 % ophthalmic solution  ? camphor-menthol (SARNA) lotion  ? diltiazem (CARDIZEM CD) 180 MG 24 hr capsule   ? dorzolamide (TRUSOPT) 2 % ophthalmic solution  ? finasteride (PROSCAR) 5 MG tablet  ? fluticasone-salmeterol (ADVAIR) 500-50 MCG/ACT AEPB  ? latanoprost (XALATAN) 0.005 % ophthalmic solution  ? Melatonin 5 MG TABS  ? montelukast (SINGULAIR) 10 MG tablet  ? Sodium Chloride, Inhalant, 7 % NEBU  ? tiotropium (SPIRIVA) 18 MCG inhalation capsule  ? XARELTO 10 MG TABS tablet  ? Desoximetasone (TOPICORT) 0.25 % ointment  ? fluticasone-salmeterol (WIXELA INHUB) 500-50 MCG/ACT AEPB  ? tamsulosin (FLOMAX) 0.4 MG CAPS capsule  ? ?History:  ? ?Past Medical History:  ?Diagnosis Date  ? AB (asthmatic bronchitis)   ? Abnormal liver enzymes   ? ABPA (allergic bronchopulmonary aspergillosis) ([place])   ? Allergic rhinitis   ? Aortic atherosclerosis ([place])   ? Asthma   ? CAP (community acquired pneumonia)   ? Cellulitis   ? COPD (chronic obstructive pulmonary disease) ([place])   ? Coronary artery disease   ? a.) LHC 08/19/2016: 50% pRCA; intervention deferred opting for med. mgmt.  ? Diastolic dysfunction   ? a.) TTE 08/18/2016: EF 50%; inf HK; mild BAE, mild RV enlargement; G1DD  ? Diverticulitis   ? DVT (deep venous thrombosis) ([place]) 08/20/2016  ? a.) Doppler 08/20/2016: chronic appearing; occlusive DVT LEFT popliteal vein, with additional nearly occlusive DVT in the overlying distal SFV.  ? Dyspnea   ? GERD (gastroesophageal reflux disease)   ? Glaucoma   ? Headache   ? History of sepsis   ? HLD (hyperlipidemia)   ? Hx MRSA infection   ? Hx of basal cell carcinoma 02/26/2018  ? L lat mid back  ? Hypertension   ?  Resolved - no meds  ? Insomnia   ? a.) takes malatonin  ? NSTEMI (non-ST elevated myocardial infarction) ([place]) 08/18/2016  ? a.) LHC 08/19/2016: 50% pRCA; intervention deferred opting for med. mgmt.  ? Osteoarthritis   ? Paroxysmal tachycardia ([place])   ? PVC (premature ventricular contraction)   ? ?Past Surgical History:  ?Procedure Laterality Date  ? APPENDECTOMY    ? BRONCHOSCOPY    ? CATARACT EXTRACTION W/PHACO Left  09/01/2019  ? Procedure: CATARACT EXTRACTION PHACO AND INTRAOCULAR LENS PLACEMENT (IOC) LEFT 11.72 01:12.2 16.2%;  Surgeon: Leandrew Koyanagi, MD;  Location: [facility];  Service: Ophthalmology;  [place]

## 2021-10-19 ENCOUNTER — Other Ambulatory Visit: Payer: Self-pay

## 2021-10-19 ENCOUNTER — Ambulatory Visit: Payer: PPO | Admitting: Urgent Care

## 2021-10-19 ENCOUNTER — Encounter: Payer: Self-pay | Admitting: Specialist

## 2021-10-19 ENCOUNTER — Ambulatory Visit
Admission: RE | Admit: 2021-10-19 | Discharge: 2021-10-19 | Disposition: A | Discharge status: Home / Self Care | Payer: PPO | Source: Ambulatory Visit | Attending: Specialist | Admitting: Specialist

## 2021-10-19 ENCOUNTER — Encounter
Admission: RE | Disposition: A | Discharge status: Home / Self Care | Payer: Self-pay | Source: Ambulatory Visit | Attending: Specialist

## 2021-10-19 DIAGNOSIS — Z7901 Long term (current) use of anticoagulants: Secondary | ICD-10-CM | POA: Insufficient documentation

## 2021-10-19 DIAGNOSIS — E785 Hyperlipidemia, unspecified: Secondary | ICD-10-CM | POA: Diagnosis not present

## 2021-10-19 DIAGNOSIS — Z86718 Personal history of other venous thrombosis and embolism: Secondary | ICD-10-CM | POA: Diagnosis not present

## 2021-10-19 DIAGNOSIS — M67432 Ganglion, left wrist: Secondary | ICD-10-CM | POA: Insufficient documentation

## 2021-10-19 DIAGNOSIS — I252 Old myocardial infarction: Secondary | ICD-10-CM | POA: Insufficient documentation

## 2021-10-19 DIAGNOSIS — I10 Essential (primary) hypertension: Secondary | ICD-10-CM | POA: Insufficient documentation

## 2021-10-19 DIAGNOSIS — J449 Chronic obstructive pulmonary disease, unspecified: Secondary | ICD-10-CM | POA: Diagnosis not present

## 2021-10-19 DIAGNOSIS — I2510 Atherosclerotic heart disease of native coronary artery without angina pectoris: Secondary | ICD-10-CM | POA: Diagnosis not present

## 2021-10-19 HISTORY — DX: Other ill-defined heart diseases: I51.89

## 2021-10-19 HISTORY — DX: Atherosclerosis of aorta: I70.0

## 2021-10-19 HISTORY — DX: Unspecified osteoarthritis, unspecified site: M19.90

## 2021-10-19 HISTORY — DX: Hyperlipidemia, unspecified: E78.5

## 2021-10-19 HISTORY — PX: GANGLION CYST EXCISION: SHX1691

## 2021-10-19 SURGERY — EXCISION, GANGLION CYST, WRIST
Anesthesia: General | Site: Wrist | Laterality: Left

## 2021-10-19 MED ORDER — GABAPENTIN 300 MG PO CAPS
ORAL_CAPSULE | ORAL | Status: AC
  Administered 2021-10-19: 300 mg via ORAL
  Filled 2021-10-19: qty 1

## 2021-10-19 MED ORDER — HYDROCODONE-ACETAMINOPHEN 5-325 MG PO TABS
1.0000 | ORAL_TABLET | Freq: Four times a day (QID) | ORAL | 0 refills | Status: DC | PRN
Start: 2021-10-19 — End: 2021-11-09

## 2021-10-19 MED ORDER — LIDOCAINE HCL (CARDIAC) PF 100 MG/5ML IV SOSY
PREFILLED_SYRINGE | INTRAVENOUS | Status: DC | PRN
  Administered 2021-10-19: 60 mg via INTRAVENOUS

## 2021-10-19 MED ORDER — MELOXICAM 7.5 MG PO TABS
ORAL_TABLET | ORAL | Status: AC
  Administered 2021-10-19: 15 mg via ORAL
  Filled 2021-10-19: qty 2

## 2021-10-19 MED ORDER — BUPIVACAINE HCL (PF) 0.5 % IJ SOLN
INTRAMUSCULAR | Status: AC
  Filled 2021-10-19: qty 30

## 2021-10-19 MED ORDER — PHENYLEPHRINE HCL-NACL 20-0.9 MG/250ML-% IV SOLN
INTRAVENOUS | Status: AC
  Filled 2021-10-19: qty 250

## 2021-10-19 MED ORDER — ONDANSETRON HCL 4 MG/2ML IJ SOLN
4.0000 mg | Freq: Once | INTRAMUSCULAR | Status: AC | PRN
  Administered 2021-10-19: 4 mg via INTRAVENOUS

## 2021-10-19 MED ORDER — ORAL CARE MOUTH RINSE: 15.0000 mL | Freq: Once | OROMUCOSAL | Status: AC

## 2021-10-19 MED ORDER — LACTATED RINGERS IV SOLN
INTRAVENOUS | Status: DC
Start: 2021-10-19 — End: 2021-10-19

## 2021-10-19 MED ORDER — FENTANYL CITRATE (PF) 100 MCG/2ML IJ SOLN
INTRAMUSCULAR | Status: AC
Start: 2021-10-19 — End: ?
  Filled 2021-10-19: qty 2

## 2021-10-19 MED ORDER — CEFAZOLIN SODIUM-DEXTROSE 2-4 GM/100ML-% IV SOLN
2.0000 g | INTRAVENOUS | Status: AC
  Administered 2021-10-19: 2 g via INTRAVENOUS

## 2021-10-19 MED ORDER — OXYCODONE HCL 5 MG/5ML PO SOLN: 5.0000 mg | Freq: Once | ORAL | Status: DC | PRN

## 2021-10-19 MED ORDER — FENTANYL CITRATE (PF) 100 MCG/2ML IJ SOLN: 25.0000 ug | INTRAMUSCULAR | Status: DC | PRN

## 2021-10-19 MED ORDER — PHENYLEPHRINE HCL-NACL 20-0.9 MG/250ML-% IV SOLN
INTRAVENOUS | Status: DC | PRN
  Administered 2021-10-19: 40 ug/min via INTRAVENOUS

## 2021-10-19 MED ORDER — FENTANYL CITRATE (PF) 100 MCG/2ML IJ SOLN
INTRAMUSCULAR | Status: DC | PRN
  Administered 2021-10-19 (×2): 25 ug via INTRAVENOUS

## 2021-10-19 MED ORDER — PROPOFOL 10 MG/ML IV BOLUS
INTRAVENOUS | Status: AC
  Filled 2021-10-19: qty 40

## 2021-10-19 MED ORDER — ONDANSETRON HCL 4 MG/2ML IJ SOLN
INTRAMUSCULAR | Status: AC
  Filled 2021-10-19: qty 2

## 2021-10-19 MED ORDER — FAMOTIDINE 20 MG PO TABS
ORAL_TABLET | ORAL | Status: AC
  Administered 2021-10-19: 20 mg via ORAL
  Filled 2021-10-19: qty 1

## 2021-10-19 MED ORDER — PHENYLEPHRINE 80 MCG/ML (10ML) SYRINGE FOR IV PUSH (FOR BLOOD PRESSURE SUPPORT)
PREFILLED_SYRINGE | INTRAVENOUS | Status: DC | PRN
  Administered 2021-10-19 (×4): 160 ug via INTRAVENOUS

## 2021-10-19 MED ORDER — PROPOFOL 10 MG/ML IV BOLUS
INTRAVENOUS | Status: DC | PRN
  Administered 2021-10-19: 110 mg via INTRAVENOUS

## 2021-10-19 MED ORDER — LACTATED RINGERS IV SOLN
INTRAVENOUS | Status: DC | PRN
Start: 2021-10-19 — End: 2021-10-19

## 2021-10-19 MED ORDER — DEXAMETHASONE SODIUM PHOSPHATE 10 MG/ML IJ SOLN
INTRAMUSCULAR | Status: DC | PRN
  Administered 2021-10-19: 5 mg via INTRAVENOUS

## 2021-10-19 MED ORDER — FAMOTIDINE 20 MG PO TABS: 20.0000 mg | ORAL_TABLET | Freq: Once | ORAL | Status: AC

## 2021-10-19 MED ORDER — CHLORHEXIDINE GLUCONATE 0.12 % MT SOLN: 15.0000 mL | Freq: Once | OROMUCOSAL | Status: AC

## 2021-10-19 MED ORDER — LACTATED RINGERS IV SOLN: INTRAVENOUS | Status: DC

## 2021-10-19 MED ORDER — 0.9 % SODIUM CHLORIDE (POUR BTL) OPTIME
TOPICAL | Status: DC | PRN
  Administered 2021-10-19: 500 mL

## 2021-10-19 MED ORDER — CEFAZOLIN SODIUM-DEXTROSE 2-4 GM/100ML-% IV SOLN
INTRAVENOUS | Status: AC
  Filled 2021-10-19: qty 100

## 2021-10-19 MED ORDER — BUPIVACAINE HCL 0.5 % IJ SOLN
INTRAMUSCULAR | Status: DC | PRN
  Administered 2021-10-19: 10 mL

## 2021-10-19 MED ORDER — ACETAMINOPHEN 10 MG/ML IV SOLN: 1000.0000 mg | Freq: Once | INTRAVENOUS | Status: DC | PRN

## 2021-10-19 MED ORDER — CHLORHEXIDINE GLUCONATE 0.12 % MT SOLN
OROMUCOSAL | Status: AC
  Administered 2021-10-19: 15 mL via OROMUCOSAL
  Filled 2021-10-19: qty 15

## 2021-10-19 MED ORDER — LIDOCAINE HCL (PF) 2 % IJ SOLN
INTRAMUSCULAR | Status: AC
  Filled 2021-10-19: qty 5

## 2021-10-19 MED ORDER — GABAPENTIN 400 MG PO CAPS: 400.0000 mg | ORAL_CAPSULE | Freq: Three times a day (TID) | ORAL | 3 refills | Status: DC

## 2021-10-19 MED ORDER — MELOXICAM 7.5 MG PO TABS: 15.0000 mg | ORAL_TABLET | ORAL | Status: AC

## 2021-10-19 MED ORDER — GABAPENTIN 300 MG PO CAPS: 300.0000 mg | ORAL_CAPSULE | ORAL | Status: AC

## 2021-10-19 MED ORDER — CHLORHEXIDINE GLUCONATE CLOTH 2 % EX PADS
6.0000 | MEDICATED_PAD | Freq: Once | CUTANEOUS | Status: AC
Start: 2021-10-19 — End: 2021-10-19
  Administered 2021-10-19: 6 via TOPICAL

## 2021-10-19 MED ORDER — EPHEDRINE SULFATE (PRESSORS) 50 MG/ML IJ SOLN
INTRAMUSCULAR | Status: DC | PRN
  Administered 2021-10-19 (×2): 5 mg via INTRAVENOUS

## 2021-10-19 MED ORDER — OXYCODONE HCL 5 MG PO TABS: 5.0000 mg | ORAL_TABLET | Freq: Once | ORAL | Status: DC | PRN

## 2021-10-19 SURGICAL SUPPLY — 35 items
APL PRP STRL LF DISP 70% ISPRP (MISCELLANEOUS) ×1
BLADE SURG MINI STRL (BLADE) ×2 IMPLANT
BNDG ESMARK 4X12 TAN STRL LF (GAUZE/BANDAGES/DRESSINGS) ×2 IMPLANT
CHLORAPREP W/TINT 26 (MISCELLANEOUS) ×2 IMPLANT
CUFF TOURN SGL QUICK 18X4 (TOURNIQUET CUFF) IMPLANT
DRSG GAUZE FLUFF 36X18 (GAUZE/BANDAGES/DRESSINGS) ×2 IMPLANT
ELECT REM PT RETURN 9FT ADLT (ELECTROSURGICAL) ×2
ELECTRODE REM PT RTRN 9FT ADLT (ELECTROSURGICAL) ×1 IMPLANT
GAUZE SPONGE 4X4 12PLY STRL (GAUZE/BANDAGES/DRESSINGS) ×2 IMPLANT
GAUZE XEROFORM 1X8 LF (GAUZE/BANDAGES/DRESSINGS) ×2 IMPLANT
GLOVE SURG ORTHO 8.0 STRL STRW (GLOVE) ×2 IMPLANT
GOWN STRL REUS W/ TWL LRG LVL3 (GOWN DISPOSABLE) ×1 IMPLANT
GOWN STRL REUS W/TWL LRG LVL3 (GOWN DISPOSABLE) ×2
GOWN STRL REUS W/TWL LRG LVL4 (GOWN DISPOSABLE) ×2 IMPLANT
KIT TURNOVER KIT A (KITS) IMPLANT
MANIFOLD NEPTUNE II (INSTRUMENTS) ×2 IMPLANT
NS IRRIG 500ML POUR BTL (IV SOLUTION) ×2 IMPLANT
PACK EXTREMITY ARMC (MISCELLANEOUS) ×2 IMPLANT
PAD CAST CTTN 4X4 STRL (SOFTGOODS) ×1 IMPLANT
PAD PREP 24X41 OB/GYN DISP (PERSONAL CARE ITEMS) ×2 IMPLANT
PADDING CAST COTTON 4X4 STRL (SOFTGOODS) ×2
SPLINT CAST 1 STEP 3X12 (MISCELLANEOUS) ×2 IMPLANT
STOCKINETTE 48X4 2 PLY STRL (GAUZE/BANDAGES/DRESSINGS) ×1 IMPLANT
STOCKINETTE BIAS CUT 4 980044 (GAUZE/BANDAGES/DRESSINGS) ×2 IMPLANT
STOCKINETTE STRL 4IN 9604848 (GAUZE/BANDAGES/DRESSINGS) ×2 IMPLANT
STRAP SAFETY 5IN WIDE (MISCELLANEOUS) ×2 IMPLANT
SUT ETHILON 4-0 (SUTURE) ×2
SUT ETHILON 4-0 FS2 18XMFL BLK (SUTURE) ×1
SUT ETHILON 5-0 (SUTURE) ×2
SUT ETHILON 5-0 C-3 18XMFL BLK (SUTURE) ×1
SUT ETHILON 6 0 9-3 1X18 BLK (SUTURE) ×1 IMPLANT
SUT VIC AB 4-0 FS2 27 (SUTURE) ×2 IMPLANT
SUTURE ETHLN 4-0 FS2 18XMF BLK (SUTURE) ×1 IMPLANT
SUTURE ETHLN 5-0 C3 18XMF BLK (SUTURE) ×1 IMPLANT
WATER STERILE IRR 500ML POUR (IV SOLUTION) ×2 IMPLANT

## 2021-10-19 NOTE — Anesthesia Procedure Notes (Signed)
Procedure Name: LMA Insertion ?Date/Time: 10/19/2021 8:17 AM ?Performed by: Loletha Grayer, CRNA ?Pre-anesthesia Checklist: Patient identified, Patient being monitored, Timeout performed, Emergency Drugs available and Suction available ?Patient Re-evaluated:Patient Re-evaluated prior to induction ?Oxygen Delivery Method: Circle system utilized ?Preoxygenation: Pre-oxygenation with 100% oxygen ?Induction Type: IV induction ?Ventilation: Mask ventilation without difficulty ?LMA: LMA inserted ?LMA Size: 4.0 ?Number of attempts: 1 ?Placement Confirmation: positive ETCO2 ?Tube secured with: Tape ?Dental Injury: Teeth and Oropharynx as per pre-operative assessment  ? ? ? ? ?

## 2021-10-19 NOTE — H&P (Signed)
THE PATIENT WAS SEEN PRIOR TO SURGERY TODAY.  HISTORY, ALLERGIES, HOME MEDICATIONS AND OPERATIVE PROCEDURE WERE REVIEWED. RISKS AND BENEFITS OF SURGERY DISCUSSED WITH PATIENT AGAIN.  NO CHANGES FROM INITIAL HISTORY AND PHYSICAL NOTED.    

## 2021-10-19 NOTE — Anesthesia Preprocedure Evaluation (Addendum)
Anesthesia Evaluation  ?Patient identified by MRN, date of birth, ID band ?Patient awake ? ? ? ?Reviewed: ?Allergy & Precautions, NPO status , Patient's Chart, lab work & pertinent test results ? ?History of Anesthesia Complications ?Negative for: history of anesthetic complications ? ?Airway ?Mallampati: III ? ? ?Neck ROM: Full ? ? ? Dental ?no notable dental hx. ? ?  ?Pulmonary ?asthma , COPD,  ?allergic bronchopulmonary aspergillosis ?  ?Pulmonary exam normal ?breath sounds clear to auscultation ? ? ? ? ? ? Cardiovascular ?hypertension, + CAD (s/p MI)  ?Normal cardiovascular exam ?Rhythm:Regular Rate:Normal ? ?Hx DVT 2019 on Xarelto, last dose 10/15/21 ? ?ECG 10/15/21:  ?Sinus rhythm with 1st degree A-V block ?Left axis deviation ?Cannot rule out Anterior infarct (cited on or before 29-Apr-2018) ?Abnormal ECG ?When compared with ECG of 29-Apr-2018 13:31, ?No significant change was found ? ?Echo 11/25/16: ?1. Normal left ventricular systolic function with an EF of 53% ?2. Mildly dilated left atrium ?3. Severely dilated right atrium ?4. Diastolic Doppler parameters consistent with abnormal relaxation (G1DD). ?5. Mild to moderate pulmonary regurgitation ?6. Mild tricuspid and mitral valve regurgitation ?7. Trace aortic valve regurgitation ?8. No pericardial effusion ?9. No evidence of right to left shunting, ASD, or VSD ?  ?Neuro/Psych ?negative neurological ROS ?   ? GI/Hepatic ?GERD  ,  ?Endo/Other  ?negative endocrine ROS ? Renal/GU ?negative Renal ROS  ? ?  ?Musculoskeletal ? ?(+) Arthritis ,  ? Abdominal ?  ?Peds ? Hematology ?negative hematology ROS ?(+)   ?Anesthesia Other Findings ?Reviewed and agree with Bay[facility] Males pre-anesthesia clinical review note. ? ?Cardiology note 11/23/20:  ?81 y.o. male with  ?ICD-10-CM ICD-9-CM  ?1. Coronary artery disease involving native coronary artery of native heart with angina pectoris (CMS-HCC)-etiology of his elevated troponin is unclear. He had  noncritical disease in his RCA. May have had acute plaque rupture with thrombus which resolved spontaneously versus viral mediated myocarditis. He has some inferior wall motion abnormalities with no high-grade ischemia. He is currently on long-acting nitrates, aspirin, atorvastatin. He has had no further symptoms. I25.119 414.01  ?413.9  ?2. Acute deep vein thrombosis (DVT) of femoral vein, unspecified laterality (CMS-HCC)-remain on Xarelto for DVT prophylaxis as per vascular surgery. I82.419 453.41  ?3. Paroxysmal tachycardia (CMS-HCC)-no arrhythmia noted. I47.9 427.2  ? ?Return in about 1 year (around 11/23/2021). ? ? Reproductive/Obstetrics ? ?  ? ? ? ? ? ? ? ? ? ? ? ? ? ?  ?  ? ? ? ? ? ? ? ?Anesthesia Physical ?Anesthesia Plan ? ?ASA: 3 ? ?Anesthesia Plan: General  ? ?Post-op Pain Management:   ? ?Induction: Intravenous ? ?PONV Risk Score and Plan: 2 and Ondansetron, Dexamethasone and Treatment may vary due to age or medical condition ? ?Airway Management Planned: LMA ? ?Additional Equipment:  ? ?Intra-op Plan:  ? ?Post-operative Plan: Extubation in OR ? ?Informed Consent: I have reviewed the patients History and Physical, chart, labs and discussed the procedure including the risks, benefits and alternatives for the proposed anesthesia with the patient or authorized representative who has indicated his/her understanding and acceptance.  ? ? ? ?Dental advisory given ? ?Plan Discussed with: CRNA ? ?Anesthesia Plan Comments: (Patient consented for risks of anesthesia including but not limited to:  ?- adverse reactions to medications ?- damage to eyes, teeth, lips or other oral mucosa ?- nerve damage due to positioning  ?- sore throat or hoarseness ?- damage to heart, brain, nerves, lungs, other parts of body or loss of  life ? ?Informed patient about role of CRNA in peri- and intra-operative care.  Patient voiced understanding.)  ? ? ? ? ? ? ?Anesthesia Quick Evaluation ? ?

## 2021-10-19 NOTE — Transfer of Care (Signed)
Immediate Anesthesia Transfer of Care Note ? ?Patient: [name] ? ?Procedure(s) Performed: REMOVAL GANGLION OF WRIST (Left: Wrist) ? ?Patient Location: PACU ? ?Anesthesia Type:General ? ?Level of Consciousness: awake ? ?Airway & Oxygen Therapy: Patient Spontanous Breathing and Patient connected to face mask oxygen ? ?Post-op Assessment: Report given to RN and Post -op Vital signs reviewed and stable ? ?Post vital signs: Reviewed and stable ? ?Last Vitals:  ?Vitals Value Taken Time  ?BP 155/88 10/19/21 0930  ?Temp    ?Pulse 77 10/19/21 0933  ?Resp 14 10/19/21 0933  ?SpO2 100 % 10/19/21 0933  ?Vitals shown include unvalidated device data. ? ?Last Pain:  ?Vitals:  ? 10/19/21 0628  ?PainSc: 0-No pain  ?   ? ?  ? ?Complications: No notable events documented. ?

## 2021-10-19 NOTE — Discharge Instructions (Signed)
AMBULATORY SURGERY  ?DISCHARGE INSTRUCTIONS ? ? ?The drugs that you were given will stay in your system until tomorrow so for the next 24 hours you should not: ? ?Drive an automobile ?Make any legal decisions ?Drink any alcoholic beverage ? ? ?You may resume regular meals tomorrow.  Today it is better to start with liquids and gradually work up to solid foods. ? ?You may eat anything you prefer, but it is better to start with liquids, then soup and crackers, and gradually work up to solid foods. ? ? ?Please notify your doctor immediately if you have any unusual bleeding, trouble breathing, redness and pain at the surgery site, drainage, fever, or pain not relieved by medication. ? ? ? ?Additional Instructions: ? ? ? ?Please contact your physician with any problems or Same Day Surgery at [phone], Monday through Friday 6 am to 4 pm, or [facility] [place] Main number at [phone].  ?

## 2021-10-19 NOTE — Anesthesia Postprocedure Evaluation (Signed)
Anesthesia Post Note ? ?Patient: [name] ? ?Procedure(s) Performed: REMOVAL GANGLION OF WRIST (Left: Wrist) ? ?Patient location during evaluation: PACU ?Anesthesia Type: General ?Level of consciousness: awake and alert, oriented and patient cooperative ?Pain management: pain level controlled ?Vital Signs Assessment: post-procedure vital signs reviewed and stable ?Respiratory status: spontaneous breathing, nonlabored ventilation and respiratory function stable ?Cardiovascular status: blood pressure returned to baseline and stable ?Postop Assessment: adequate PO intake ?Anesthetic complications: no ? ? ?No notable events documented. ? ? ?Last Vitals:  ?Vitals:  ? 10/19/21 0955 10/19/21 1005  ?BP: (!) 148/79 139/72  ?Pulse: 75 80  ?Resp: 11 16  ?Temp:  (!) 36.1 ?C  ?SpO2: 96% 95%  ?  ?Last Pain:  ?Vitals:  ? 10/19/21 1005  ?TempSrc: Temporal  ?PainSc: 0-No pain  ? ? ?  ?  ?  ?  ?  ?  ? ?Darrin Nipper ? ? ? ? ?

## 2021-10-23 LAB — SURGICAL PATHOLOGY

## 2021-10-25 NOTE — Op Note (Signed)
10/19/2021 ? ?4:36 PM ? ?PATIENT:  [name]   ? ?PRE-OPERATIVE DIAGNOSIS:  M67.432 Ganglion, left wrist ? ?POST-OPERATIVE DIAGNOSIS:  Same ? ?PROCEDURE:  REMOVAL GANGLION OF WRIST ? ?SURGEON:  Park Breed, MD ? ?ANESTHESIA:   General  ? ?TOURNIQUET TIME: 34 MIN ? ?PREOPERATIVE INDICATIONS:  [name] is a  81 y.o. male with a diagnosis of M67.432 Ganglion, left wrist who failed conservative measures and elected for surgical management.   ? ?The risks benefits and alternatives were discussed with the patient preoperatively including but not limited to the risks of infection, bleeding, nerve injury, cardiopulmonary complications, the need for revision surgery, among others, and the patient was willing to proceed. ? ? OPERATIVE FINDINGS: There is a large volar ganglion going down into the joint.  The radial artery was draped directly over this and had to be peeled off carefully using loupe magnification.  Small branches were coagulated and tied off. ? . ? ?OPERATIVE PROCEDURE: The patient was brought to the operating room where they underwent satisfactory general LMA anesthesia.  The  arm was prepped and draped in sterile fashion.  Esmarch was applied and tourniquet inflated to 250 mmHg.  A longitudinal incision was made over the volar radial aspect of the  wrist.  Dissection was carried out carefully under loupe magnification with fine scissors.  The ganglion was quickly evident and dissection was carried out around it in a careful manner.  The radial artery was identified and freed up from adhesions.  It was retracted radially.  The ganglion was followed down to the wrist joint and was seen to exit through the volar capsule. The volar capsule was debrided.  The entire ganglion was removed.  Tourniquet was deflated with sponges applying pressure over the wound to check for bleeding since the radial artery was so friable.  Small bleeders were coagulated and a small branches were ligated.  There was no active  bleeding after this.  There is good circulation to the hand.  Wound was irrigated and subcutaneous tissue closed with 4-0 Vicryl and the skin with 5-0 nylon.  Sponge and needle counts were correct.  1/2%  Marcaine was placed in the wound.  A dry sterile hand dressing was applied with a volar splint.  The patient was awakened and taken to recovery in good condition.   ? ?Park Breed, M.D. ? ? ?  3 ?

## 2021-11-01 DIAGNOSIS — H401122 Primary open-angle glaucoma, left eye, moderate stage: Secondary | ICD-10-CM | POA: Diagnosis not present

## 2021-11-01 DIAGNOSIS — H401113 Primary open-angle glaucoma, right eye, severe stage: Secondary | ICD-10-CM | POA: Diagnosis not present

## 2021-11-07 ENCOUNTER — Encounter: Payer: Self-pay | Admitting: Physician Assistant

## 2021-11-07 ENCOUNTER — Ambulatory Visit (INDEPENDENT_AMBULATORY_CARE_PROVIDER_SITE_OTHER): Payer: PPO | Admitting: Physician Assistant

## 2021-11-07 VITALS — BP 135/78 | HR 68 | Resp 16 | Ht 74.0 in | Wt 171.0 lb

## 2021-11-07 DIAGNOSIS — M7989 Other specified soft tissue disorders: Secondary | ICD-10-CM

## 2021-11-07 DIAGNOSIS — L03119 Cellulitis of unspecified part of limb: Secondary | ICD-10-CM

## 2021-11-07 DIAGNOSIS — M19071 Primary osteoarthritis, right ankle and foot: Secondary | ICD-10-CM

## 2021-11-07 MED ORDER — AMOXICILLIN-POT CLAVULANATE 875-125 MG PO TABS: 1.0000 | ORAL_TABLET | Freq: Two times a day (BID) | ORAL | 0 refills | Status: DC

## 2021-11-07 NOTE — Progress Notes (Signed)
I,Joseline E Rosas,acting as a Education administrator for [employer], PA-C.,have documented all relevant documentation on the behalf of Mardene Speak, PA-C,as directed by  [employer], PA-C while in the presence of [employer], PA-C.   Established patient visit   Patient: [name]   DOB: [birth year]   81 y.o. Male  MRN: [id number] Visit Date: 11/07/2021  Today's healthcare provider: Mardene Speak, PA-C   Chief Complaint  Patient presents with   Leg Swelling   Subjective    HPI  Edema: Patient complains of edema. The location of the edema is lower leg(s) right.  The edema has been severe.  Onset of symptoms was 2 weeks ago, gradually worsening since that time. The edema is present all day. The swelling has been aggravated by  walking , relieved by nothing, and been associated with nothing. Cardiac risk factors include advanced age (older than 28 for men, 64 for women), hypertension, and male gender. No chest pain, SOB or dizziness. Last ECG was done 10/15/2021.  Wt Readings from Last 3 Encounters:  11/07/21 171 lb (77.6 kg)  10/19/21 162 lb (73.5 kg)  08/29/21 162 lb 14.4 oz (73.9 kg)   BP Readings from Last 3 Encounters:  11/07/21 135/78  10/19/21 139/72  08/29/21 (!) 96/52      Medications: Outpatient Medications Prior to Visit  Medication Sig   albuterol (PROVENTIL HFA;VENTOLIN HFA) 108 (90 Base) MCG/ACT inhaler Inhale 2 puffs into the lungs every 6 (six) hours as needed for wheezing or shortness of breath.   albuterol (PROVENTIL) (2.5 MG/3ML) 0.083% nebulizer solution Take 2.5 mg by nebulization in the morning and at bedtime.   azithromycin (ZITHROMAX) 250 MG tablet Take 250 mg by mouth daily.   brimonidine (ALPHAGAN) 0.15 % ophthalmic solution Place 1 drop into both eyes 2 (two) times daily.   camphor-menthol (SARNA) lotion Apply 1 application. topically as needed for itching.   Desoximetasone (TOPICORT) 0.25 % ointment Apply 1 application. topically 2 (two) times daily. To  eczema of upper arm   diltiazem (CARDIZEM CD) 180 MG 24 hr capsule TAKE 1 CAPSULE BY MOUTH ONCE DAILY (Patient taking differently: 180 mg every evening.)   dorzolamide (TRUSOPT) 2 % ophthalmic solution Place 1 drop into both eyes 2 (two) times daily.    finasteride (PROSCAR) 5 MG tablet TAKE 1 TABLET BY MOUTH DAILY (Patient taking differently: 5 mg every evening.)   fluticasone-salmeterol (ADVAIR) 500-50 MCG/ACT AEPB Wixela Inhub 500 mcg-50 mcg/dose powder for inhalation  INHALE 1 PUFF BY MOUTH TWO TIMES A DAY.   gabapentin (NEURONTIN) 400 MG capsule Take 1 capsule (400 mg total) by mouth 3 (three) times daily.   latanoprost (XALATAN) 0.005 % ophthalmic solution Place 1 drop into both eyes at bedtime.    Melatonin 5 MG TABS Take 5 mg by mouth at bedtime.   montelukast (SINGULAIR) 10 MG tablet Take 10 mg by mouth at bedtime.   Sodium Chloride, Inhalant, 7 % NEBU Take 1 vial by nebulization 2 (two) times daily.   tamsulosin (FLOMAX) 0.4 MG CAPS capsule Take 2 capsules (0.8 mg total) by mouth daily.   tiotropium (SPIRIVA) 18 MCG inhalation capsule Place 18 mcg into inhaler and inhale every evening.   XARELTO 10 MG TABS tablet Take 1 tablet (10 mg total) by mouth daily.   fluticasone-salmeterol (ADVAIR) 500-50 MCG/ACT AEPB Inhale 1 puff into the lungs daily.   fluticasone-salmeterol (ADVAIR) 500-50 MCG/ACT AEPB Inhale 1 puff into the lungs in the morning and at bedtime.  HYDROcodone-acetaminophen (NORCO) 5-325 MG tablet Take 1-2 tablets by mouth every 6 (six) hours as needed.   No facility-administered medications prior to visit.    Review of Systems  All other systems reviewed and are negative. Except HPI    Objective    BP 135/78 (BP Location: Right Arm, Patient Position: Sitting, Cuff Size: Normal)   Pulse 68   Resp 16   Ht '6\' 2"'$  (1.88 m)   Wt 171 lb (77.6 kg)   BMI 21.96 kg/m    Physical Exam Vitals reviewed.  Constitutional:      Appearance: Normal appearance.  HENT:      Head: Normocephalic and atraumatic.  Cardiovascular:     Rate and Rhythm: Normal rate and regular rhythm.     Pulses: Normal pulses.     Heart sounds: Normal heart sounds.  Pulmonary:     Effort: Pulmonary effort is normal.     Breath sounds: Normal breath sounds.  Musculoskeletal:        General: Swelling and tenderness present.     Right lower leg: Edema present.  Neurological:     Mental Status: He is alert and oriented to person, place, and time.  Psychiatric:        Behavior: Behavior normal.        Thought Content: Thought content normal.        Judgment: Judgment normal.      No results found for any visits on 11/07/21.  Assessment & Plan     1. Cellulitis of lower extremity, unspecified laterality Erythema&swelling, pain, OA - amoxicillin-clavulanate (AUGMENTIN) 875-125 MG tablet; Take 1 tablet by mouth 2 (two) times daily.  Dispense: 28 tablet; Refill: 0 - DG Foot Complete Right; Future - DG Ankle Complete Right; Future - US Venous Img Lower Unilateral Right (DVT); Future  2. Leg swelling, right Calf pain, erythema&swelling Hx of DVT, left lower leg Continue  xarelto '10mg'$  daily - DG Foot Complete Right; Future - DG Ankle Complete Right; Future - US Venous Img Lower Unilateral Right (DVT); Future  3. Osteoarthritis of right foot, unspecified osteoarthritis type Per patient, was evaluated for foot pain 2 weeks a prior at Ortho. - DG Foot Complete Right; Future - DG Ankle Complete Right; Future   FU as scheduled     The patient was advised to call back or seek an in-person evaluation if the symptoms worsen or if the condition fails to improve as anticipated.  I discussed the assessment and treatment plan with the patient. The patient was provided an opportunity to ask questions and all were answered. The patient agreed with the plan and demonstrated an understanding of the instructions.  The entirety of the information documented in the History of Present  Illness, Review of Systems and Physical Exam were personally obtained by me. Portions of this information were initially documented by the CMA and reviewed by me for thoroughness and accuracy.    Portions of this note were created using dictation software and may contain typographical errors.      Total encounter time more than 30 minutes Greater than 50% was spent in counseling and coordination of care with the patient  Elberta Leatherwood  [facility] [phone] (phone) [phone] (fax)  [place]

## 2021-11-08 ENCOUNTER — Ambulatory Visit
Admission: RE | Admit: 2021-11-08 | Discharge: 2021-11-08 | Disposition: A | Discharge status: Home / Self Care | Payer: PPO | Attending: Physician Assistant | Admitting: Physician Assistant

## 2021-11-08 ENCOUNTER — Ambulatory Visit
Admission: RE | Admit: 2021-11-08 | Discharge: 2021-11-08 | Disposition: A | Discharge status: Home / Self Care | Payer: PPO | Source: Ambulatory Visit | Attending: Physician Assistant | Admitting: Physician Assistant

## 2021-11-08 DIAGNOSIS — M19071 Primary osteoarthritis, right ankle and foot: Secondary | ICD-10-CM | POA: Insufficient documentation

## 2021-11-08 DIAGNOSIS — L03119 Cellulitis of unspecified part of limb: Secondary | ICD-10-CM | POA: Insufficient documentation

## 2021-11-08 DIAGNOSIS — M7989 Other specified soft tissue disorders: Secondary | ICD-10-CM

## 2021-11-09 ENCOUNTER — Telehealth: Payer: Self-pay | Admitting: Physician Assistant

## 2021-11-09 NOTE — Telephone Encounter (Signed)
Called and left message. His order for US doppler / for DVT evaluation expired on 11/07/21 Advised to proceed to UC if symptoms worsen And contact us if he has any questions.

## 2021-11-13 NOTE — Progress Notes (Unsigned)
I,Roshena L Chambers,acting as a Education administrator for [employer], PA-C.,have documented all relevant documentation on the behalf of Mardene Speak, PA-C,as directed by  [employer], PA-C while in the presence of [employer], PA-C.   Established patient visit   Patient: [name]   DOB: 12-Jul-1940   81 y.o. Male  MRN: [id number] Visit Date: 11/14/2021  Today's healthcare provider: Mardene Speak, PA-C   Chief Complaint  Patient presents with   Cellulitis   Subjective    HPI  Follow up for cellulitis of lower extremity and edema:  The patient was last seen for this on 11/07/2021.  The location of the edema is lower leg(s) right.  The edema has been severe but improved now. Onset of symptoms was 3 weeks ago.  Cardiac risk factors include advanced age (older than 59 for men, 22 for women), hypertension, and male gender. No chest pain, SOB or dizziness. Last ECG was done 10/15/2021. Changes made at last visit include starting amoxicillin-clavulanate (AUGMENTIN) 875-125 MG tablet; Take 1 tablet by mouth 2 (two) times daily. Ankle and foot x rays were also ordered.  He reports good compliance with treatment. He feels that condition is Improved. Still has some swelling. He is not having side effects.    -----------------------------------------------------------------------------------------   Medications: Outpatient Medications Prior to Visit  Medication Sig   albuterol (PROVENTIL HFA;VENTOLIN HFA) 108 (90 Base) MCG/ACT inhaler Inhale 2 puffs into the lungs every 6 (six) hours as needed for wheezing or shortness of breath.   albuterol (PROVENTIL) (2.5 MG/3ML) 0.083% nebulizer solution Take 2.5 mg by nebulization in the morning and at bedtime.   amoxicillin-clavulanate (AUGMENTIN) 875-125 MG tablet Take 1 tablet by mouth 2 (two) times daily.   azithromycin (ZITHROMAX) 250 MG tablet Take 250 mg by mouth daily.   brimonidine (ALPHAGAN) 0.15 % ophthalmic solution Place 1 drop into both  eyes 2 (two) times daily.   camphor-menthol (SARNA) lotion Apply 1 application. topically as needed for itching.   Desoximetasone (TOPICORT) 0.25 % ointment Apply 1 application. topically 2 (two) times daily. To eczema of upper arm   diltiazem (CARDIZEM CD) 180 MG 24 hr capsule TAKE 1 CAPSULE BY MOUTH ONCE DAILY (Patient taking differently: 180 mg every evening.)   dorzolamide (TRUSOPT) 2 % ophthalmic solution Place 1 drop into both eyes 2 (two) times daily.    finasteride (PROSCAR) 5 MG tablet TAKE 1 TABLET BY MOUTH DAILY (Patient taking differently: 5 mg every evening.)   fluticasone-salmeterol (ADVAIR) 500-50 MCG/ACT AEPB Wixela Inhub 500 mcg-50 mcg/dose powder for inhalation  INHALE 1 PUFF BY MOUTH TWO TIMES A DAY.   gabapentin (NEURONTIN) 400 MG capsule Take 1 capsule (400 mg total) by mouth 3 (three) times daily.   latanoprost (XALATAN) 0.005 % ophthalmic solution Place 1 drop into both eyes at bedtime.    Melatonin 5 MG TABS Take 5 mg by mouth at bedtime.   montelukast (SINGULAIR) 10 MG tablet Take 10 mg by mouth at bedtime.   Sodium Chloride, Inhalant, 7 % NEBU Take 1 vial by nebulization 2 (two) times daily.   tamsulosin (FLOMAX) 0.4 MG CAPS capsule Take 2 capsules (0.8 mg total) by mouth daily.   tiotropium (SPIRIVA) 18 MCG inhalation capsule Place 18 mcg into inhaler and inhale every evening.   XARELTO 10 MG TABS tablet Take 1 tablet (10 mg total) by mouth daily.   fluticasone-salmeterol (ADVAIR) 500-50 MCG/ACT AEPB Inhale 1 puff into the lungs daily.   No facility-administered medications  prior to visit.    Review of Systems  Constitutional:  Negative for appetite change, chills and fever.  Respiratory:  Negative for chest tightness, shortness of breath and wheezing.   Cardiovascular:  Negative for chest pain and palpitations.  Gastrointestinal:  Negative for abdominal pain, nausea and vomiting.      Objective    BP 129/75 (BP Location: Right Arm, Patient Position: Sitting,  Cuff Size: Normal)   Pulse 61   Temp 97.7 F (36.5 C) (Oral)   Resp 12   Wt 168 lb (76.2 kg)   SpO2 98% Comment: room air  BMI 21.57 kg/m    Physical Exam Vitals reviewed.  Constitutional:      General: He is not in acute distress.    Appearance: Normal appearance. He is not diaphoretic.  HENT:     Head: Normocephalic and atraumatic.  Eyes:     General: No scleral icterus.    Conjunctiva/sclera: Conjunctivae normal.  Cardiovascular:     Rate and Rhythm: Normal rate and regular rhythm.     Pulses: Normal pulses.     Heart sounds: Normal heart sounds. No murmur heard. Pulmonary:     Effort: Pulmonary effort is normal. No respiratory distress.     Breath sounds: Normal breath sounds. No wheezing or rhonchi.  Abdominal:     General: There is no distension.     Palpations: Abdomen is soft.     Tenderness: There is no abdominal tenderness.  Musculoskeletal:        General: Swelling present. No tenderness.     Cervical back: Neck supple.     Right lower leg: Edema present.     Left lower leg: No edema.  Lymphadenopathy:     Cervical: No cervical adenopathy.  Skin:    General: Skin is warm and dry.     Capillary Refill: Capillary refill takes 2 to 3 seconds.     Findings: Erythema (mild) present. No rash.  Neurological:     Mental Status: He is alert and oriented to person, place, and time.     Cranial Nerves: No cranial nerve deficit.     Sensory: No sensory deficit.     Coordination: Coordination normal.  Psychiatric:        Mood and Affect: Mood normal.        Behavior: Behavior normal.      No results found for any visits on 11/14/21.  Assessment & Plan     1. Cellulitis of lower extremity, unspecified laterality Improved, no tenderness on palpation appreciated Continue abx course FU in 1 week after completion of abx  2. Leg swelling, foot, right Still present. Photo placed in the media Hx of DVT< Left lower leg, continue xarelto 10 mg daily -US Doppler  was scheduled with Burman Freestone. At [facility]   3. Osteoarthritis of right foot, unspecified osteoarthritis type Per pt, 2 mo ago, steroid shot was administered at [facility]... FU was scheduled with Dr. Rosanna Randy in a week   FU as scheduled     The patient was advised to call back or seek an in-person evaluation if the symptoms worsen or if the condition fails to improve as anticipated.  I discussed the assessment and treatment plan with the patient. The patient was provided an opportunity to ask questions and all were answered. The patient agreed with the plan and demonstrated an understanding of the instructions.  The entirety of the information documented in the History of Present Illness,  Review of Systems and Physical Exam were personally obtained by me. Portions of this information were initially documented by the CMA and reviewed by me for thoroughness and accuracy.  Portions of this note were created using dictation software and may contain typographical errors.     Mardene Speak, PA-C  [facility] [phone] (phone) [phone] (fax)  [place]

## 2021-11-14 ENCOUNTER — Ambulatory Visit
Admission: RE | Admit: 2021-11-14 | Discharge: 2021-11-14 | Disposition: A | Discharge status: Home / Self Care | Payer: PPO | Source: Ambulatory Visit | Attending: Physician Assistant | Admitting: Physician Assistant

## 2021-11-14 ENCOUNTER — Ambulatory Visit: Payer: Self-pay | Admitting: *Deleted

## 2021-11-14 ENCOUNTER — Encounter: Payer: Self-pay | Admitting: Physician Assistant

## 2021-11-14 ENCOUNTER — Ambulatory Visit (INDEPENDENT_AMBULATORY_CARE_PROVIDER_SITE_OTHER): Payer: PPO | Admitting: Physician Assistant

## 2021-11-14 VITALS — BP 129/75 | HR 61 | Temp 97.7°F | Resp 12 | Wt 168.0 lb

## 2021-11-14 DIAGNOSIS — L03119 Cellulitis of unspecified part of limb: Secondary | ICD-10-CM | POA: Insufficient documentation

## 2021-11-14 DIAGNOSIS — M19071 Primary osteoarthritis, right ankle and foot: Secondary | ICD-10-CM | POA: Diagnosis not present

## 2021-11-14 DIAGNOSIS — M7989 Other specified soft tissue disorders: Secondary | ICD-10-CM | POA: Diagnosis not present

## 2021-11-14 DIAGNOSIS — M79604 Pain in right leg: Secondary | ICD-10-CM | POA: Diagnosis not present

## 2021-11-14 NOTE — Telephone Encounter (Signed)
Kim from [facility] ultrasound dept called to report right lower venous doppler negative for DVT, patient has left and awaiting call from clinic with results. Dailey notified encounter sent for PCP to be notified of stat results .

## 2021-11-15 ENCOUNTER — Encounter: Payer: Self-pay | Admitting: Physician Assistant

## 2021-11-21 ENCOUNTER — Ambulatory Visit (INDEPENDENT_AMBULATORY_CARE_PROVIDER_SITE_OTHER): Payer: PPO | Admitting: Family Medicine

## 2021-11-21 ENCOUNTER — Encounter: Payer: Self-pay | Admitting: Family Medicine

## 2021-11-21 VITALS — BP 112/61 | HR 71 | Resp 16 | Wt 166.0 lb

## 2021-11-21 DIAGNOSIS — I10 Essential (primary) hypertension: Secondary | ICD-10-CM | POA: Diagnosis not present

## 2021-11-21 DIAGNOSIS — E441 Mild protein-calorie malnutrition: Secondary | ICD-10-CM

## 2021-11-21 DIAGNOSIS — M19071 Primary osteoarthritis, right ankle and foot: Secondary | ICD-10-CM | POA: Diagnosis not present

## 2021-11-21 DIAGNOSIS — L03119 Cellulitis of unspecified part of limb: Secondary | ICD-10-CM | POA: Diagnosis not present

## 2021-11-21 DIAGNOSIS — M7989 Other specified soft tissue disorders: Secondary | ICD-10-CM

## 2021-11-21 DIAGNOSIS — J449 Chronic obstructive pulmonary disease, unspecified: Secondary | ICD-10-CM

## 2021-11-21 DIAGNOSIS — J189 Pneumonia, unspecified organism: Secondary | ICD-10-CM | POA: Diagnosis not present

## 2021-11-21 MED ORDER — DOXYCYCLINE HYCLATE 100 MG PO TABS: 100.0000 mg | ORAL_TABLET | Freq: Two times a day (BID) | ORAL | 1 refills | Status: DC
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
## 2021-11-23 DIAGNOSIS — J449 Chronic obstructive pulmonary disease, unspecified: Secondary | ICD-10-CM | POA: Diagnosis not present

## 2021-11-30 ENCOUNTER — Ambulatory Visit: Payer: Self-pay | Admitting: *Deleted

## 2021-11-30 NOTE — Telephone Encounter (Signed)
  Chief Complaint: diarrhea 2 hrs after eating a bowel of cereal with almond milk Symptoms: mild abd cramping/pain with diarrhea started 2 hrs ago   Has not taken anything OTC for diarrhea Frequency: started 2 hrs ago Pertinent Negatives: Patient denies vomiting or fever Disposition: '[]'$ ED /'[]'$ Urgent Care (no appt availability in office) / '[]'$ Appointment(In office/virtual)/ '[]'$  [facility]/ '[x]'$ Home Care/ '[]'$ Refused Recommended Disposition /'[]'$ [facility]/ '[]'$  Follow-up with PCP Additional Notes:

## 2021-11-30 NOTE — Telephone Encounter (Signed)
Reason for Disposition  MILD-MODERATE diarrhea (e.g., 1-6 times / day more than normal)  Answer Assessment - Initial Assessment Questions 1. DIARRHEA SEVERITY: "How bad is the diarrhea?" "How many more stools have you had in the past 24 hours than normal?"    - NO DIARRHEA (SCALE 0)   - MILD (SCALE 1-3): Few loose or mushy BMs; increase of 1-3 stools over normal daily number of stools; mild increase in ostomy output.   -  MODERATE (SCALE 4-7): Increase of 4-6 stools daily over normal; moderate increase in ostomy output. * SEVERE (SCALE 8-10; OR 'WORST POSSIBLE'): Increase of 7 or more stools daily over normal; moderate increase in ostomy output; incontinence.     I'm having diarrhea and abd pain.   I had nausea too but it has resolved.    2. ONSET: "When did the diarrhea begin?"      2 hrs ago.   Got up and ate a bowel of cereal  and the diarrhea started.    3. BM CONSISTENCY: "How loose or watery is the diarrhea?"      Some soft and some watery 4. VOMITING: "Are you also vomiting?" If Yes, ask: "How many times in the past 24 hours?"      No  5. ABDOMINAL PAIN: "Are you having any abdominal pain?" If Yes, ask: "What does it feel like?" (e.g., crampy, dull, intermittent, constant)      Yes   Hurting in lower front started with diarrhea 6. ABDOMINAL PAIN SEVERITY: If present, ask: "How bad is the pain?"  (e.g., Scale 1-10; mild, moderate, or severe)   - MILD (1-3): doesn't interfere with normal activities, abdomen soft and not tender to touch    - MODERATE (4-7): interferes with normal activities or awakens from sleep, abdomen tender to touch    - SEVERE (8-10): excruciating pain, doubled over, unable to do any normal activities       Mild pain with diarrhea started 2 hours ago after eating a bowel of cereal with almond milk 7. ORAL INTAKE: If vomiting, "Have you been able to drink liquids?" "How much liquids have you had in the past 24 hours?"      8. HYDRATION: "Any signs of dehydration?"  (e.g., dry mouth [not just dry lips], too weak to stand, dizziness, new weight loss) "When did you last urinate?"     No 9. EXPOSURE: "Have you traveled to a foreign country recently?" "Have you been exposed to anyone with diarrhea?" "Could you have eaten any food that was spoiled?"     N/A 10. ANTIBIOTIC USE: "Are you taking antibiotics now or have you taken antibiotics in the past 2 months?"       N/A 11. OTHER SYMPTOMS: "Do you have any other symptoms?" (e.g., fever, blood in stool)       The nausea has passed 12. PREGNANCY: "Is there any chance you are pregnant?" "When was your last menstrual period?"       N/A  Protocols used: [facility]

## 2021-12-03 ENCOUNTER — Ambulatory Visit (INDEPENDENT_AMBULATORY_CARE_PROVIDER_SITE_OTHER): Payer: PPO | Admitting: Family Medicine

## 2021-12-03 ENCOUNTER — Ambulatory Visit: Payer: Self-pay | Admitting: *Deleted

## 2021-12-03 VITALS — BP 104/92 | HR 71 | Temp 98.7°F | Wt 160.0 lb

## 2021-12-03 DIAGNOSIS — K5792 Diverticulitis of intestine, part unspecified, without perforation or abscess without bleeding: Secondary | ICD-10-CM | POA: Diagnosis not present

## 2021-12-03 DIAGNOSIS — R1032 Left lower quadrant pain: Secondary | ICD-10-CM

## 2021-12-03 MED ORDER — DOXYCYCLINE HYCLATE 100 MG PO TABS
100.0000 mg | ORAL_TABLET | Freq: Two times a day (BID) | ORAL | 1 refills | Status: DC
Start: 2021-12-03 — End: 2022-03-05

## 2021-12-03 NOTE — Telephone Encounter (Signed)
  Chief Complaint: Diarrhea Symptoms: Diarrhea 4x in 24 hrs. Onset Friday AM, followed home care advise, ineffective.Now with abdominal pain. "It's my diverticulitis"  Frequency: Friday 3am Pertinent Negatives: Patient denies blood in stool, nausea, vomiting Disposition: '[]'$ ED /'[]'$ Urgent Care (no appt availability in office) / '[x]'$ Appointment(In office/virtual)/ '[]'$  [facility]/ '[]'$ Home Care/ '[]'$ Refused Recommended Disposition /'[]'$ [facility]/ '[]'$  Follow-up with PCP Additional Notes: Pt states last flare up of diverticulitis 6/8/months ago "It feels like that."  Reason for Disposition  [1] Constant abdominal pain AND [2] present > 2 hours  Answer Assessment - Initial Assessment Questions 1. DIARRHEA SEVERITY: "How bad is the diarrhea?" "How many more stools have you had in the past 24 hours than normal?"    - NO DIARRHEA (SCALE 0)   - MILD (SCALE 1-3): Few loose or mushy BMs; increase of 1-3 stools over normal daily number of stools; mild increase in ostomy output.   -  MODERATE (SCALE 4-7): Increase of 4-6 stools daily over normal; moderate increase in ostomy output. * SEVERE (SCALE 8-10; OR 'WORST POSSIBLE'): Increase of 7 or more stools daily over normal; moderate increase in ostomy output; incontinence.     4 at least 2. ONSET: "When did the diarrhea begin?"      Friday am 3. BM CONSISTENCY: "How loose or watery is the diarrhea?"      Loose 4. VOMITING: "Are you also vomiting?" If Yes, ask: "How many times in the past 24 hours?"      Not pain 5. ABDOMINAL PAIN: "Are you having any abdominal pain?" If Yes, ask: "What does it feel like?" (e.g., crampy, dull, intermittent, constant)      Lower, all across 6. ABDOMINAL PAIN SEVERITY: If present, ask: "How bad is the pain?"  (e.g., Scale 1-10; mild, moderate, or severe)   - MILD (1-3): doesn't interfere with normal activities, abdomen soft and not tender to touch    - MODERATE (4-7): interferes with normal activities or  awakens from sleep, abdomen tender to touch    - SEVERE (8-10): excruciating pain, doubled over, unable to do any normal activities       8/10 7. ORAL INTAKE: If vomiting, "Have you been able to drink liquids?" "How much liquids have you had in the past 24 hours?"     NA 8. HYDRATION: "Any signs of dehydration?" (e.g., dry mouth [not just dry lips], too weak to stand, dizziness, new weight loss) "When did you last urinate?"     Able to stay hydrated  10. ANTIBIOTIC USE: "Are you taking antibiotics now or have you taken antibiotics in the past 2 months?"       no 11. OTHER SYMPTOMS: "Do you have any other symptoms?" (e.g., fever, blood in stool)       NO  Protocols used: Diarrhea-A-AH

## 2021-12-03 NOTE — Progress Notes (Unsigned)
   Acute Office Visit  Subjective:     Patient ID: TAMARICK KOVALCIK, male    DOB: 09-Jul-1940, 81 y.o.   MRN: [id number]  No chief complaint on file.   HPI Patient is in today for evaluation of diarrhea.  He states he has had diverticulitis in the past and this feels the same. His symptoms began 3 days ago.  Patient states that he did have abdominal pain which is eased some but now is very sore.  He doesn't think that he has had any fever but does feel he is dehydrated.   It is of note that the patient is down 6 pounds since 11/21/21 This is how his diverticulitis has felt in the past.  He has had no fevers. ROS      Objective:    BP (!) 104/92 (BP Location: Right Arm, Patient Position: Sitting, Cuff Size: Normal)   Pulse 71   Temp 98.7 F (37.1 C) (Oral)   Wt 160 lb (72.6 kg)   SpO2 98%   BMI 20.54 kg/m  {Vitals History (Optional):23777}  Physical Exam  No results found for any visits on 12/03/21.      Assessment & Plan:   Problem List Items Addressed This Visit   None   No orders of the defined types were placed in this encounter.   No follow-ups on file.  Juluis Mire, CMA

## 2021-12-05 DIAGNOSIS — R600 Localized edema: Secondary | ICD-10-CM | POA: Diagnosis not present

## 2021-12-05 DIAGNOSIS — J411 Mucopurulent chronic bronchitis: Secondary | ICD-10-CM | POA: Diagnosis not present

## 2021-12-05 DIAGNOSIS — I479 Paroxysmal tachycardia, unspecified: Secondary | ICD-10-CM | POA: Diagnosis not present

## 2021-12-05 DIAGNOSIS — I25119 Atherosclerotic heart disease of native coronary artery with unspecified angina pectoris: Secondary | ICD-10-CM | POA: Diagnosis not present

## 2021-12-06 DIAGNOSIS — Z09 Encounter for follow-up examination after completed treatment for conditions other than malignant neoplasm: Secondary | ICD-10-CM | POA: Diagnosis not present

## 2021-12-06 DIAGNOSIS — Z8709 Personal history of other diseases of the respiratory system: Secondary | ICD-10-CM | POA: Diagnosis not present

## 2021-12-12 ENCOUNTER — Ambulatory Visit: Payer: PPO | Admitting: Podiatry

## 2021-12-17 ENCOUNTER — Other Ambulatory Visit: Payer: Self-pay | Admitting: Dermatology

## 2021-12-17 DIAGNOSIS — L2081 Atopic neurodermatitis: Secondary | ICD-10-CM

## 2021-12-31 ENCOUNTER — Ambulatory Visit: Payer: PPO | Admitting: Podiatry

## 2021-12-31 ENCOUNTER — Encounter: Payer: Self-pay | Admitting: Podiatry

## 2021-12-31 DIAGNOSIS — M76821 Posterior tibial tendinitis, right leg: Secondary | ICD-10-CM

## 2021-12-31 DIAGNOSIS — M778 Other enthesopathies, not elsewhere classified: Secondary | ICD-10-CM

## 2021-12-31 DIAGNOSIS — G5791 Unspecified mononeuropathy of right lower limb: Secondary | ICD-10-CM

## 2021-12-31 MED ORDER — TRIAMCINOLONE ACETONIDE 40 MG/ML IJ SUSP
20.0000 mg | Freq: Once | INTRAMUSCULAR | Status: AC
  Administered 2021-12-31: 20 mg

## 2021-12-31 NOTE — Progress Notes (Signed)
He presents today complaining of pain right under here as he points beneath the first metatarsal medial cuneiform joint.  He states that the injection helped last time.  Objective: Vital signs are stable he is alert and oriented x3.  He has an area of fluctuance on palpation beneath the first metatarsal medial cuneiform joint consistent with bursitis.  He has significant osteoarthritis with severe pes planus right foot.  Assessment: Osteoarthritis with bursitis right foot with pes planus bilateral.  Plan: I injected the bursa today with 5 mg of Kenalog and local anesthetic and I will follow-up with him in a few months.

## 2022-01-21 ENCOUNTER — Encounter: Payer: PPO | Admitting: Family Medicine

## 2022-01-25 DIAGNOSIS — J449 Chronic obstructive pulmonary disease, unspecified: Secondary | ICD-10-CM | POA: Diagnosis not present

## 2022-01-25 DIAGNOSIS — I482 Chronic atrial fibrillation, unspecified: Secondary | ICD-10-CM | POA: Diagnosis not present

## 2022-01-30 ENCOUNTER — Ambulatory Visit: Payer: PPO | Admitting: Podiatry

## 2022-01-30 DIAGNOSIS — M25552 Pain in left hip: Secondary | ICD-10-CM | POA: Diagnosis not present

## 2022-01-30 DIAGNOSIS — I452 Bifascicular block: Secondary | ICD-10-CM | POA: Diagnosis not present

## 2022-01-30 DIAGNOSIS — J479 Bronchiectasis, uncomplicated: Secondary | ICD-10-CM | POA: Diagnosis not present

## 2022-01-30 DIAGNOSIS — I493 Ventricular premature depolarization: Secondary | ICD-10-CM | POA: Diagnosis not present

## 2022-01-30 DIAGNOSIS — J4550 Severe persistent asthma, uncomplicated: Secondary | ICD-10-CM | POA: Diagnosis not present

## 2022-01-30 DIAGNOSIS — Z87891 Personal history of nicotine dependence: Secondary | ICD-10-CM | POA: Diagnosis not present

## 2022-01-30 DIAGNOSIS — Z792 Long term (current) use of antibiotics: Secondary | ICD-10-CM | POA: Diagnosis not present

## 2022-01-30 DIAGNOSIS — B965 Pseudomonas (aeruginosa) (mallei) (pseudomallei) as the cause of diseases classified elsewhere: Secondary | ICD-10-CM | POA: Diagnosis not present

## 2022-01-30 DIAGNOSIS — I440 Atrioventricular block, first degree: Secondary | ICD-10-CM | POA: Diagnosis not present

## 2022-01-30 DIAGNOSIS — R42 Dizziness and giddiness: Secondary | ICD-10-CM | POA: Diagnosis not present

## 2022-01-30 DIAGNOSIS — Z7951 Long term (current) use of inhaled steroids: Secondary | ICD-10-CM | POA: Diagnosis not present

## 2022-01-30 DIAGNOSIS — K219 Gastro-esophageal reflux disease without esophagitis: Secondary | ICD-10-CM | POA: Diagnosis not present

## 2022-01-30 DIAGNOSIS — T363X5A Adverse effect of macrolides, initial encounter: Secondary | ICD-10-CM | POA: Diagnosis not present

## 2022-02-27 ENCOUNTER — Other Ambulatory Visit: Payer: Self-pay | Admitting: Family Medicine

## 2022-03-06 ENCOUNTER — Ambulatory Visit (INDEPENDENT_AMBULATORY_CARE_PROVIDER_SITE_OTHER): Payer: PPO

## 2022-03-06 VITALS — Wt 160.0 lb

## 2022-03-06 DIAGNOSIS — Z0000 Encounter for general adult medical examination without abnormal findings: Secondary | ICD-10-CM

## 2022-03-06 DIAGNOSIS — R059 Cough, unspecified: Secondary | ICD-10-CM | POA: Diagnosis not present

## 2022-03-06 NOTE — Progress Notes (Signed)
Virtual Visit via Telephone Note  I connected with  [name] on 03/06/22 at  2:00 PM EDT by telephone and verified that I am speaking with the correct person using two identifiers.  Location: Patient: home Provider: [facility] Persons participating in the virtual visit: [place]   I discussed the limitations, risks, security and privacy concerns of performing an evaluation and management service by telephone and the availability of in person appointments. The patient expressed understanding and agreed to proceed.  Interactive audio and video telecommunications were attempted between this nurse and patient, however failed, due to patient having technical difficulties OR patient did not have access to video capability.  We continued and completed visit with audio only.  Some vital signs may be absent or patient reported.   Dionisio David, LPN  Subjective:   JAYLUN FLEENER is a 81 y.o. male who presents for Medicare Annual/Subsequent preventive examination.  Review of Systems     Cardiac Risk Factors include: advanced age (>82mn, >>65women);male gender     Objective:    There were no vitals filed for this visit. There is no height or weight on file to calculate BMI.     03/06/2022    2:04 PM 10/19/2021    6:26 AM 10/12/2021   12:02 PM 03/04/2021    8:14 AM 09/29/2020    4:16 PM 09/28/2020   10:28 PM 12/22/2019    9:25 AM  Advanced Directives  Does Patient Have a Medical Advance Directive? No Yes Yes Yes Yes No Yes  Type of ACorporate treasurerof ALyonsLiving will  Healthcare Power of [place]Living will  [place] will  Does patient want to make changes to medical advance directive?  No - Patient declined  No - Patient declined     Copy of [place] Chart?  Yes - validated most recent copy scanned in chart (See row information)  No - copy requested   Yes - validated most  recent copy scanned in chart (See row information)  Would patient like information on creating a medical advance directive? No - Patient declined          Current Medications (verified) Outpatient Encounter Medications as of 03/06/2022  Medication Sig   albuterol (PROVENTIL HFA;VENTOLIN HFA) 108 (90 Base) MCG/ACT inhaler Inhale 2 puffs into the lungs every 6 (six) hours as needed for wheezing or shortness of breath.   albuterol (PROVENTIL) (2.5 MG/3ML) 0.083% nebulizer solution Take 2.5 mg by nebulization in the morning and at bedtime.   Albuterol Sulfate, sensor, 108 (90 Base) MCG/ACT AEPB    azithromycin (ZITHROMAX) 250 MG tablet Take 1 tablet by mouth daily.   brimonidine (ALPHAGAN) 0.15 % ophthalmic solution Place 1 drop into both eyes 2 (two) times daily.   camphor-menthol (SARNA) lotion Apply 1 application. topically as needed for itching.   Desoximetasone (TOPICORT) 0.25 % ointment APPLY TO ECZEMA OF UPPER ARM TWICE DAILYAS DIRECTED.   dorzolamide (TRUSOPT) 2 % ophthalmic solution Place 1 drop into both eyes 2 (two) times daily.    finasteride (PROSCAR) 5 MG tablet TAKE 1 TABLET BY MOUTH DAILY (Patient taking differently: 5 mg every evening.)   fluticasone (FLONASE) 50 MCG/ACT nasal spray    Fluticasone-Salmeterol (WIXELA INHUB IN) Inhale into the lungs.   ketoconazole (NIZORAL) 2 % shampoo    latanoprost (XALATAN) 0.005 % ophthalmic solution Place 1 drop into both eyes at bedtime.  Melatonin 5 MG TABS Take 5 mg by mouth at bedtime.   mometasone (ELOCON) 0.1 % cream    montelukast (SINGULAIR) 10 MG tablet Take 10 mg by mouth at bedtime.   Sodium Chloride, Inhalant, 7 % NEBU Take 1 vial by nebulization 2 (two) times daily.   tamsulosin (FLOMAX) 0.4 MG CAPS capsule Take 0.8 mg by mouth daily.   tiotropium (SPIRIVA) 18 MCG inhalation capsule Place 18 mcg into inhaler and inhale every evening.   XARELTO 10 MG TABS tablet Take 1 tablet (10 mg total) by mouth daily.   ciprofloxacin  (CIPRO) 500 MG tablet TAKE 1 TABLET (500 MG TOTAL) BY MOUTH TWICE A DAY FOR 10 DAYS (Patient not taking: Reported on 03/06/2022)   diltiazem (CARDIZEM CD) 180 MG 24 hr capsule TAKE 1 CAPSULE BY MOUTH ONCE DAILY (Patient not taking: Reported on 03/06/2022)   [DISCONTINUED] amoxicillin-clavulanate (AUGMENTIN) 500-125 MG tablet    [DISCONTINUED] amoxicillin-clavulanate (AUGMENTIN) 875-125 MG tablet    [DISCONTINUED] brimonidine (ALPHAGAN) 0.2 % ophthalmic solution    [DISCONTINUED] cephALEXin (KEFLEX) 500 MG capsule Take 1 capsule by mouth 3 (three) times daily.   [DISCONTINUED] Crisaborole (EUCRISA) 2 % OINT    [DISCONTINUED] doxycycline (VIBRA-TABS) 100 MG tablet Take 1 tablet (100 mg total) by mouth 2 (two) times daily.   [DISCONTINUED] fluticasone-salmeterol (ADVAIR) 500-50 MCG/ACT AEPB Inhale into the lungs.   [DISCONTINUED] fluticasone-salmeterol (WIXELA INHUB) 500-50 MCG/ACT AEPB Inhale 1 puff into the lungs in the morning and at bedtime.   [DISCONTINUED] gabapentin (NEURONTIN) 400 MG capsule Take 1 capsule (400 mg total) by mouth 3 (three) times daily.   [DISCONTINUED] HYDROcodone-acetaminophen (NORCO/VICODIN) 5-325 MG tablet    [DISCONTINUED] ketoconazole (NIZORAL) 2 % cream    [DISCONTINUED] Melatonin-Pyridoxine 5-1 MG TABS    [DISCONTINUED] predniSONE (DELTASONE) 10 MG tablet PLEASE SEE ATTACHED FOR DETAILED DIRECTIONS   [DISCONTINUED] predniSONE (DELTASONE) 20 MG tablet Take 40 mg by mouth daily.   No facility-administered encounter medications on file as of 03/06/2022.    Allergies (verified) Timolol maleate [timolol maleate], Atorvastatin, Bactrim [sulfamethoxazole-trimethoprim], Lisinopril, and Symbicort [budesonide-formoterol fumarate]   History: Past Medical History:  Diagnosis Date   AB (asthmatic bronchitis)    Abnormal liver enzymes    ABPA (allergic bronchopulmonary aspergillosis) (HCC)    Allergic rhinitis    Aortic atherosclerosis ([facility])    Asthma    CAP (community  acquired pneumonia)    Cellulitis    COPD (chronic obstructive pulmonary disease) ([place])    Coronary artery disease    a.) LHC 08/19/2016: 50% pRCA; intervention deferred opting for med. mgmt.   Diastolic dysfunction    a.) TTE 08/18/2016: EF 50%; inf HK; mild BAE, mild RV enlargement; G1DD   Diverticulitis    DVT (deep venous thrombosis) ([place]) 08/20/2016   a.) Doppler 08/20/2016: chronic appearing; occlusive DVT LEFT popliteal vein, with additional nearly occlusive DVT in the overlying distal SFV.   Dyspnea    GERD (gastroesophageal reflux disease)    Glaucoma    Headache    History of sepsis    HLD (hyperlipidemia)    Hx MRSA infection    Hx of basal cell carcinoma 02/26/2018   L lat mid back   Hypertension    Resolved - no meds   Insomnia    a.) takes malatonin   NSTEMI (non-ST elevated myocardial infarction) ([place]) 08/18/2016   a.) LHC 08/19/2016: 50% pRCA; intervention deferred opting for med. mgmt.   Osteoarthritis    Paroxysmal tachycardia (HCC)    PVC (premature  ventricular contraction)    Past Surgical History:  Procedure Laterality Date   APPENDECTOMY     BRONCHOSCOPY     CATARACT EXTRACTION W/PHACO Left 09/01/2019   Procedure: CATARACT EXTRACTION PHACO AND INTRAOCULAR LENS PLACEMENT ([facility]) LEFT 11.72 01:12.2 16.2%;  Surgeon: Leandrew Koyanagi, MD;  Location: [facility];  Service: Ophthalmology;  Laterality: Left;   CATARACT EXTRACTION W/PHACO Right 09/22/2019   Procedure: CATARACT EXTRACTION PHACO AND INTRAOCULAR LENS PLACEMENT ([place]) RIGHT;  Surgeon: Leandrew Koyanagi, MD;  Location: [facility];  Service: Ophthalmology;  Laterality: Right;  6.88 103.4 10.9%   GANGLION CYST EXCISION Left 10/19/2021   Procedure: REMOVAL GANGLION OF WRIST;  Surgeon: Earnestine Leys, MD;  Location: [facility];  Service: Orthopedics;  Laterality: Left;   LEFT HEART CATH AND CORONARY ANGIOGRAPHY N/A 08/19/2016   Procedure: Left Heart Cath and Coronary Angiography;  Surgeon:  Dionisio David, MD;  Location: [facility];  Service: Cardiovascular;  Laterality: N/A;   NASAL SINUS SURGERY  09/27/2014   Family History  Problem Relation Age of Onset   Breast cancer Sister    Heart failure Mother    Pancreatic cancer Sister    Alzheimer's disease Father    Brain cancer Daughter    Social History   Socioeconomic History   Marital status: Married    Spouse name: Everlene Farrier   Number of children: 2   Years of education: College   Highest education level: Bachelor's degree (e.g., BA, AB, BS)  Occupational History   Occupation: Retired    Fish farm manager: RETIRED    Comment: Engineer  Tobacco Use   Smoking status: Former    Packs/day: 0.50    Years: 3.00    Total pack years: 1.50    Types: Cigarettes    Quit date: 06/17/1958    Years since quitting: 63.7   Smokeless tobacco: Never   Tobacco comments:    smoking while in college  Vaping Use   Vaping Use: Never used  Substance and Sexual Activity   Alcohol use: Yes    Alcohol/week: 3.0 - 4.0 standard drinks of alcohol    Types: 3 - 4 Glasses of wine per week    Comment: occ wine   Drug use: No   Sexual activity: Yes  Other Topics Concern   Not on file  Social History Narrative   He is a married father of 2 daughters.   He smoked casually while in college for about 3 years less than a pack a day.   He drinks 3-4 glass of wine at night. He also has about 2 servings of coffee a day   He is a very active gentleman with [employer]ne[employer] exercise program at least 3-4 days a week.   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   In addition to this standing his exercise, he does lots of gardening and uses a chain saw to cut wood.            Social Determinants of Health   Financial Resource Strain: Low Risk  (03/06/2022)   Overall Financial Resource Strain (CARDIA)    Difficulty of Paying Living Expenses: Not hard at all  Food Insecurity: No Food Insecurity (03/06/2022)   Hunger Vital  Sign    Worried About Running Out of Food in the Last Year: Never true    Ran Out of Food in the Last Year: Never true  Transportation Needs: No Transportation Needs (03/06/2022)   PRAPARE -  Hydrologist (Medical): No    Lack of Transportation (Non-Medical): No  Physical Activity: Sufficiently Active (03/06/2022)   Exercise Vital Sign    Days of Exercise per Week: 3 days    Minutes of Exercise per Session: 60 min  Stress: No Stress Concern Present (03/06/2022)   [place]    Feeling of Stress : Not at all  Social Connections: Moderately Integrated (03/06/2022)   Social Connection and Isolation Panel [NHANES]    Frequency of Communication with Friends and Family: More than three times a week    Frequency of Social Gatherings with Friends and Family: Once a week    Attends Religious Services: More than 4 times per year    Active Member of [employer] or Organizations: No    Attends Music therapist: Never    Marital Status: Married    Tobacco Counseling Counseling given: Not Answered Tobacco comments: smoking while in college   Clinical Intake:  Pre-visit preparation completed: Yes  Pain : No/denies pain     Nutritional Risks: None Diabetes: No  How often do you need to have someone help you when you read instructions, pamphlets, or other written materials from your doctor or pharmacy?: 1 - Never  Diabetic?no  Interpreter Needed?: No  Information entered by :: Kirke Shaggy, LPN   Activities of Daily Living    03/06/2022    2:05 PM 11/07/2021    3:41 PM  In your present state of health, do you have any difficulty performing the following activities:  Hearing? 0 0  Vision? 0 0  Difficulty concentrating or making decisions? 0 0  Walking or climbing stairs? 0 0  Dressing or bathing? 0 0  Doing errands, shopping? 0 0  Preparing Food and eating ? N   Using the Toilet?  N   In the past six months, have you accidently leaked urine? N   Do you have problems with loss of bowel control? N   Managing your Medications? N   Managing your Finances? N   Housekeeping or managing your Housekeeping? N     Patient Care Team: Jerrol Banana., MD as PCP - General (Family Medicine) Leandrew Koyanagi, MD as Referring Physician (Ophthalmology) Robyne Askew, MD as Referring Physician (Internal Medicine) Dory Horn, MD as Referring Physician (Otolaryngology) Teodoro Spray, MD as Consulting Physician (Cardiology) Garrel Ridgel, [place] as Consulting Physician (Podiatry) Bjorn Loser, MD as Consulting Physician (Urology)  Indicate any recent Medical Services you may have received from other than [facility] providers in the past year (date may be approximate).     Assessment:   This is a routine wellness examination for [place].  Hearing/Vision screen Hearing Screening - Comments:: No aids  Vision Screening - Comments:: Readers- [facility]   Dietary issues and exercise activities discussed: Current Exercise Habits: Home exercise routine, Type of exercise: walking, Time (Minutes): 60, Frequency (Times/Week): 3, Weekly Exercise (Minutes/Week): 180   Goals Addressed             This Visit's Progress    DIET - EAT MORE FRUITS AND VEGETABLES         Depression Screen    03/06/2022    2:03 PM 11/07/2021    3:40 PM 07/18/2021    9:10 AM 03/04/2021    8:10 AM 01/02/2021   10:29 AM 05/02/2020    8:09 AM 12/22/2019    9:23 AM  PHQ 2/9 Scores  PHQ - 2 Score 0 0 0 0 0 0 0  PHQ- 9 Score 0  0   0   Exception Documentation    Patient refusal       Fall Risk    03/06/2022    2:05 PM 11/07/2021    3:40 PM 07/18/2021    9:09 AM 03/04/2021    8:16 AM 01/02/2021   10:29 AM  Fall Risk   Falls in the past year? 0 0 0 0 0  Number falls in past yr: 0 0 0 0 0  Injury with Fall? 0 0 0 0 0  Risk for fall due to : No Fall Risks  No Fall Risks No Fall Risks No  Fall Risks  Follow up Falls prevention discussed;Falls evaluation completed    Falls evaluation completed    FALL RISK PREVENTION PERTAINING TO THE HOME:  Any stairs in or around the home? Yes  If so, are there any without handrails? No  Home free of loose throw rugs in walkways, pet beds, electrical cords, etc? Yes  Adequate lighting in your home to reduce risk of falls? Yes   ASSISTIVE DEVICES UTILIZED TO PREVENT FALLS:  Life alert? No  Use of a cane, walker or w/c? No  Grab bars in the bathroom? Yes  Shower chair or bench in shower? Yes  Elevated toilet seat or a handicapped toilet? No    Cognitive Function:        03/06/2022    2:06 PM 03/04/2021    8:16 AM 01/02/2021   10:30 AM 12/22/2019    9:28 AM 12/07/2018    8:55 AM  6CIT Screen  What Year? 0 points 0 points 0 points 0 points 0 points  What month? 0 points 0 points 0 points 0 points 0 points  What time? 0 points 0 points 0 points 0 points 0 points  Count back from 20 0 points 0 points 0 points 0 points 2 points  Months in reverse 0 points 0 points 0 points 0 points 0 points  Repeat phrase 0 points 0 points 0 points 0 points 0 points  Total Score 0 points 0 points 0 points 0 points 2 points    Immunizations Immunization History  Administered Date(s) Administered   Fluad Quad(high Dose 65+) 02/08/2019, 03/02/2020   Influenza Split 03/02/2012, 03/17/2013, 05/07/2014, 02/23/2018   Influenza Whole 03/24/2007, 03/15/2009, 02/15/2010   Influenza, High Dose Seasonal PF 05/04/2015, 05/04/2015, 03/21/2016, 03/21/2016, 03/24/2017, 03/24/2017   Influenza,inj,Quad PF,6+ Mos 03/14/2021   Influenza-Unspecified 02/23/2018   PFIZER(Purple Top)SARS-COV-2 Vaccination 06/28/2019, 07/19/2019, 03/20/2020   Pfizer Covid-19 Vaccine Bivalent Booster 66yr & up 03/16/2021   Pneumococcal Conjugate-13 11/17/2013   Pneumococcal Polysaccharide-23 11/19/2004, 03/19/2007, 03/17/2009, 02/09/2020   Td 05/16/1997, 05/14/2007, 09/11/2010   Td  (Adult), 2 Lf Tetanus Toxid, Preservative Free 05/16/1997, 05/14/2007, 09/11/2010   Tdap 03/14/2021   Zoster Recombinat (Shingrix) 02/23/2018, 04/28/2018   Zoster, Live 01/02/2011    TDAP status: Up to date  Flu Vaccine status: Up to date  Pneumococcal vaccine status: Up to date  Covid-19 vaccine status: Completed vaccines  Qualifies for Shingles Vaccine? Yes   Zostavax completed Yes   Shingrix Completed?: Yes  Screening Tests Health Maintenance  Topic Date Due   COVID-19 Vaccine (5 - Pfizer risk series) 05/11/2021   INFLUENZA VACCINE  01/15/2022   TETANUS/TDAP  03/15/2031   Pneumonia Vaccine 81 Years old  Completed   Zoster Vaccines- Shingrix  Completed   HPV VACCINES  Aged Out    Health Maintenance  Health Maintenance Due  Topic Date Due   COVID-19 Vaccine (5 - Pfizer risk series) 05/11/2021   INFLUENZA VACCINE  01/15/2022    Colorectal cancer screening: No longer required.   Lung Cancer Screening: (Low Dose CT Chest recommended if Age 24-80 years, 30 pack-year currently smoking OR have quit w/in 15years.) does not qualify.    Additional Screening:  Hepatitis C Screening: does not qualify; Completed no  Vision Screening: Recommended annual ophthalmology exams for early detection of glaucoma and other disorders of the eye. Is the patient up to date with their annual eye exam?  Yes  Who is the provider or what is the name of the office in which the patient attends annual eye exams? [facility] If pt is not established with a provider, would they like to be referred to a provider to establish care? No .   Dental Screening: Recommended annual dental exams for proper oral hygiene  Community Resource Referral / Chronic Care Management: CRR required this visit?  No   CCM required this visit?  No      Plan:     I have personally reviewed and noted the following in the patient's chart:   Medical and social history Use of alcohol, tobacco or illicit drugs   Current medications and supplements including opioid prescriptions. Patient is not currently taking opioid prescriptions. Functional ability and status Nutritional status Physical activity Advanced directives List of other physicians Hospitalizations, surgeries, and ER visits in previous 12 months Vitals Screenings to include cognitive, depression, and falls Referrals and appointments  In addition, I have reviewed and discussed with patient certain preventive protocols, quality metrics, and best practice recommendations. A written personalized care plan for preventive services as well as general preventive health recommendations were provided to patient.     Dionisio David, LPN   [id number]/[id number]   Nurse Notes: none

## 2022-03-06 NOTE — Patient Instructions (Signed)
[name] , Thank you for taking time to come for your Medicare Wellness Visit. I appreciate your ongoing commitment to your health goals. Please review the following plan we discussed and let me know if I can assist you in the future.   Screening recommendations/referrals: Colonoscopy: aged out  Recommended yearly ophthalmology/optometry visit for glaucoma screening and checkup Recommended yearly dental visit for hygiene and checkup  Vaccinations: Influenza vaccine: 03/14/21 Pneumococcal vaccine: 11/17/13 Tdap vaccine: 03/14/21 Shingles vaccine: Zostavax 01/02/11   Shingrix 02/23/18, 04/28/18   Covid-19: 06/28/19, 07/19/19, 03/20/20, 03/16/21  Advanced directives: no  Conditions/risks identified: none  Next appointment: Follow up in one year for your annual wellness visit. 03/11/23 @ 1:30 pm by phone  Preventive Care 65 Years and Older, Male Preventive care refers to lifestyle choices and visits with your health care provider that can promote health and wellness. What does preventive care include? A yearly physical exam. This is also called an annual well check. Dental exams once or twice a year. Routine eye exams. Ask your health care provider how often you should have your eyes checked. Personal lifestyle choices, including: Daily care of your teeth and gums. Regular physical activity. Eating a healthy diet. Avoiding tobacco and drug use. Limiting alcohol use. Practicing safe sex. Taking low doses of aspirin every day. Taking vitamin and mineral supplements as recommended by your health care provider. What happens during an annual well check? The services and screenings done by your health care provider during your annual well check will depend on your age, overall health, lifestyle risk factors, and family history of disease. Counseling  Your health care provider may ask you questions about your: Alcohol use. Tobacco use. Drug use. Emotional well-being. Home and relationship  well-being. Sexual activity. Eating habits. History of falls. Memory and ability to understand (cognition). Work and work Statistician. Screening  You may have the following tests or measurements: Height, weight, and BMI. Blood pressure. Lipid and cholesterol levels. These may be checked every 5 years, or more frequently if you are over 83 years old. Skin check. Lung cancer screening. You may have this screening every year starting at age 83 if you have a 30-pack-year history of smoking and currently smoke or have quit within the past 15 years. Fecal occult blood test (FOBT) of the stool. You may have this test every year starting at age 83. Flexible sigmoidoscopy or colonoscopy. You may have a sigmoidoscopy every 5 years or a colonoscopy every 10 years starting at age 83. Prostate cancer screening. Recommendations will vary depending on your family history and other risks. Hepatitis C blood test. Hepatitis B blood test. Sexually transmitted disease (STD) testing. Diabetes screening. This is done by checking your blood sugar (glucose) after you have not eaten for a while (fasting). You may have this done every 1-3 years. Abdominal aortic aneurysm (AAA) screening. You may need this if you are a current or former smoker. Osteoporosis. You may be screened starting at age 83 if you are at high risk. Talk with your health care provider about your test results, treatment options, and if necessary, the need for more tests. Vaccines  Your health care provider may recommend certain vaccines, such as: Influenza vaccine. This is recommended every year. Tetanus, diphtheria, and acellular pertussis (Tdap, Td) vaccine. You may need a Td booster every 10 years. Zoster vaccine. You may need this after age 83. Pneumococcal 13-valent conjugate (PCV13) vaccine. One dose is recommended after age 83. Pneumococcal polysaccharide (PPSV23) vaccine. One dose is  recommended after age 83. Talk to your health care  provider about which screenings and vaccines you need and how often you need them. This information is not intended to replace advice given to you by your health care provider. Make sure you discuss any questions you have with your health care provider. Document Released: 06/30/2015 Document Revised: 02/21/2016 Document Reviewed: 04/04/2015 Elsevier Interactive Patient Education  2017 [place] Prevention in the Home Falls can cause injuries. They can happen to people of all ages. There are many things you can do to make your home safe and to help prevent falls. What can I do on the outside of my home? Regularly fix the edges of walkways and driveways and fix any cracks. Remove anything that might make you trip as you walk through a door, such as a raised step or threshold. Trim any bushes or trees on the path to your home. Use bright outdoor lighting. Clear any walking paths of anything that might make someone trip, such as rocks or tools. Regularly check to see if handrails are loose or broken. Make sure that both sides of any steps have handrails. Any raised decks and porches should have guardrails on the edges. Have any leaves, snow, or ice cleared regularly. Use sand or salt on walking paths during winter. Clean up any spills in your garage right away. This includes oil or grease spills. What can I do in the bathroom? Use night lights. Install grab bars by the toilet and in the tub and shower. Do not use towel bars as grab bars. Use non-skid mats or decals in the tub or shower. If you need to sit down in the shower, use a plastic, non-slip stool. Keep the floor dry. Clean up any water that spills on the floor as soon as it happens. Remove soap buildup in the tub or shower regularly. Attach bath mats securely with double-sided non-slip rug tape. Do not have throw rugs and other things on the floor that can make you trip. What can I do in the bedroom? Use night lights. Make  sure that you have a light by your bed that is easy to reach. Do not use any sheets or blankets that are too big for your bed. They should not hang down onto the floor. Have a firm chair that has side arms. You can use this for support while you get dressed. Do not have throw rugs and other things on the floor that can make you trip. What can I do in the kitchen? Clean up any spills right away. Avoid walking on wet floors. Keep items that you use a lot in easy-to-reach places. If you need to reach something above you, use a strong step stool that has a grab bar. Keep electrical cords out of the way. Do not use floor polish or wax that makes floors slippery. If you must use wax, use non-skid floor wax. Do not have throw rugs and other things on the floor that can make you trip. What can I do with my stairs? Do not leave any items on the stairs. Make sure that there are handrails on both sides of the stairs and use them. Fix handrails that are broken or loose. Make sure that handrails are as long as the stairways. Check any carpeting to make sure that it is firmly attached to the stairs. Fix any carpet that is loose or worn. Avoid having throw rugs at the top or bottom of the stairs. If  you do have throw rugs, attach them to the floor with carpet tape. Make sure that you have a light switch at the top of the stairs and the bottom of the stairs. If you do not have them, ask someone to add them for you. What else can I do to help prevent falls? Wear shoes that: Do not have high heels. Have rubber bottoms. Are comfortable and fit you well. Are closed at the toe. Do not wear sandals. If you use a stepladder: Make sure that it is fully opened. Do not climb a closed stepladder. Make sure that both sides of the stepladder are locked into place. Ask someone to hold it for you, if possible. Clearly mark and make sure that you can see: Any grab bars or handrails. First and last steps. Where the  edge of each step is. Use tools that help you move around (mobility aids) if they are needed. These include: Canes. Walkers. Scooters. Crutches. Turn on the lights when you go into a dark area. Replace any light bulbs as soon as they burn out. Set up your furniture so you have a clear path. Avoid moving your furniture around. If any of your floors are uneven, fix them. If there are any pets around you, be aware of where they are. Review your medicines with your doctor. Some medicines can make you feel dizzy. This can increase your chance of falling. Ask your doctor what other things that you can do to help prevent falls. This information is not intended to replace advice given to you by your health care provider. Make sure you discuss any questions you have with your health care provider. Document Released: 03/30/2009 Document Revised: 11/09/2015 Document Reviewed: 07/08/2014 Elsevier Interactive Patient Education  2017 [employer].

## 2022-03-12 ENCOUNTER — Ambulatory Visit (INDEPENDENT_AMBULATORY_CARE_PROVIDER_SITE_OTHER): Payer: PPO | Admitting: Family Medicine

## 2022-03-12 VITALS — BP 144/74 | HR 77 | Temp 98.0°F | Wt 164.0 lb

## 2022-03-12 DIAGNOSIS — N401 Enlarged prostate with lower urinary tract symptoms: Secondary | ICD-10-CM

## 2022-03-12 DIAGNOSIS — Z23 Encounter for immunization: Secondary | ICD-10-CM | POA: Diagnosis not present

## 2022-03-12 DIAGNOSIS — J189 Pneumonia, unspecified organism: Secondary | ICD-10-CM | POA: Diagnosis not present

## 2022-03-12 DIAGNOSIS — I10 Essential (primary) hypertension: Secondary | ICD-10-CM

## 2022-03-12 DIAGNOSIS — Z0000 Encounter for general adult medical examination without abnormal findings: Secondary | ICD-10-CM | POA: Diagnosis not present

## 2022-03-12 DIAGNOSIS — E441 Mild protein-calorie malnutrition: Secondary | ICD-10-CM

## 2022-03-12 DIAGNOSIS — J449 Chronic obstructive pulmonary disease, unspecified: Secondary | ICD-10-CM

## 2022-03-12 DIAGNOSIS — R351 Nocturia: Secondary | ICD-10-CM

## 2022-03-12 NOTE — Progress Notes (Unsigned)
Complete physical exam   Patient: [name]   DOB: 1940-12-17   81 y.o. Male  MRN: [id number] Visit Date: 03/12/2022  Today's healthcare provider: Wilhemena Durie, MD   No chief complaint on file.  Subjective    [name] is a 81 y.o. male who presents today for a complete physical exam.  Patient has been married for 62 years.  He has 2 children, 1 daughter unfortunately died with metastatic breast cancer.  He has 2 grandchildren.  HPI  Currently feels well but he would like to see urology for prostate "prostate services".  He is having nocturia every hour and a half with hesitancy.  Past Medical History:  Diagnosis Date   AB (asthmatic bronchitis)    Abnormal liver enzymes    ABPA (allergic bronchopulmonary aspergillosis) (HCC)    Allergic rhinitis    Aortic atherosclerosis ([place])    Asthma    CAP (community acquired pneumonia)    Cellulitis    COPD (chronic obstructive pulmonary disease) ([place])    Coronary artery disease    a.) LHC 08/19/2016: 50% pRCA; intervention deferred opting for med. mgmt.   Diastolic dysfunction    a.) TTE 08/18/2016: EF 50%; inf HK; mild BAE, mild RV enlargement; G1DD   Diverticulitis    DVT (deep venous thrombosis) ([place]) 08/20/2016   a.) Doppler 08/20/2016: chronic appearing; occlusive DVT LEFT popliteal vein, with additional nearly occlusive DVT in the overlying distal SFV.   Dyspnea    GERD (gastroesophageal reflux disease)    Glaucoma    Headache    History of sepsis    HLD (hyperlipidemia)    Hx MRSA infection    Hx of basal cell carcinoma 02/26/2018   L lat mid back   Hypertension    Resolved - no meds   Insomnia    a.) takes malatonin   NSTEMI (non-ST elevated myocardial infarction) ([place]) 08/18/2016   a.) LHC 08/19/2016: 50% pRCA; intervention deferred opting for med. mgmt.   Osteoarthritis    Paroxysmal tachycardia (HCC)    PVC (premature ventricular contraction)    Past Surgical History:  Procedure Laterality  Date   APPENDECTOMY     BRONCHOSCOPY     CATARACT EXTRACTION W/PHACO Left 09/01/2019   Procedure: CATARACT EXTRACTION PHACO AND INTRAOCULAR LENS PLACEMENT (I[facility]) LEFT 11.72 01:12.2 16.2%;  Surgeon: Leandrew Koyanagi, MD;  Location: [facility];  Service: Ophthalmology;  Laterality: Left;   CATARACT EXTRACTION W/PHACO Right 09/22/2019   Procedure: CATARACT EXTRACTION PHACO AND INTRAOCULAR LENS PLACEMENT ([place]) RIGHT;  Surgeon: Leandrew Koyanagi, MD;  Location: [facility];  Service: Ophthalmology;  Laterality: Right;  6.88 103.4 10.9%   GANGLION CYST EXCISION Left 10/19/2021   Procedure: REMOVAL GANGLION OF WRIST;  Surgeon: Earnestine Leys, MD;  Location: [facility];  Service: Orthopedics;  Laterality: Left;   LEFT HEART CATH AND CORONARY ANGIOGRAPHY N/A 08/19/2016   Procedure: Left Heart Cath and Coronary Angiography;  Surgeon: Dionisio David, MD;  Location: [facility];  Service: Cardiovascular;  Laterality: N/A;   NASAL SINUS SURGERY  09/27/2014   Social History   Socioeconomic History   Marital status: Married    Spouse name: Everlene Farrier   Number of children: 2   Years of education: [employer] education level: Bachelor's degree (e.g., BA, AB, BS)  Occupational History   Occupation: Retired    Fish farm manager: RETIRED    Comment: Engineer  Tobacco Use   Smoking status: Former  Packs/day: 0.50    Years: 3.00    Total pack years: 1.50    Types: Cigarettes    Quit date: 06/17/1958    Years since quitting: 63.7   Smokeless tobacco: Never   Tobacco comments:    smoking while in college  Vaping Use   Vaping Use: Never used  Substance and Sexual Activity   Alcohol use: Yes    Alcohol/week: 3.0 - 4.0 standard drinks of alcohol    Types: 3 - 4 Glasses of wine per week    Comment: occ wine   Drug use: No   Sexual activity: Yes  Other Topics Concern   Not on file  Social History Narrative   He is a married father of 2 daughters.   He smoked casually while in  college for about 3 years less than a pack a day.   He drinks 3-4 glass of wine at night. He also has about 2 servings of coffee a day   He is a very active gentleman with [employer] exercise program at least 3-4 days a week.   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   In addition to this standing his exercise, he does lots of gardening and uses a chain saw to cut wood.            Social Determinants of Health   Financial Resource Strain: Low Risk  (03/06/2022)   Overall Financial Resource Strain (CARDIA)    Difficulty of Paying Living Expenses: Not hard at all  Food Insecurity: No Food Insecurity (03/06/2022)   Hunger Vital Sign    Worried About Running Out of Food in the Last Year: Never true    Ran Out of Food in the Last Year: Never true  Transportation Needs: No Transportation Needs (03/06/2022)   PRAPARE - Hydrologist (Medical): No    Lack of Transportation (Non-Medical): No  Physical Activity: Sufficiently Active (03/06/2022)   Exercise Vital Sign    Days of Exercise per Week: 3 days    Minutes of Exercise per Session: 60 min  Stress: No Stress Concern Present (03/06/2022)   [place]    Feeling of Stress : Not at all  Social Connections: Moderately Integrated (03/06/2022)   Social Connection and Isolation Panel [NHANES]    Frequency of Communication with Friends and Family: More than three times a week    Frequency of Social Gatherings with Friends and Family: Once a week    Attends Religious Services: More than 4 times per year    Active Member of [employer] or Organizations: No    Attends Archivist Meetings: Never    Marital Status: Married  Human resources officer Violence: Not At Risk (03/06/2022)   Humiliation, Afraid, Rape, and Kick questionnaire    Fear of Current or Ex-Partner: No    Emotionally Abused: No    Physically Abused: No    Sexually  Abused: No   Family Status  Relation Name Status   Sister  Deceased   Mother  Deceased at age 12   Sister  Deceased   Father  Deceased at age 60   Sister  39   Sister  Alive   Daughter  Deceased       Recently past away 2015   Family History  Problem Relation Age of Onset   Breast cancer Sister    Heart failure Mother  Pancreatic cancer Sister    Alzheimer's disease Father    Brain cancer Daughter    Allergies  Allergen Reactions   Timolol Maleate [Timolol Maleate] Shortness Of Breath   Atorvastatin Swelling    Swelling in legs, dizziness   Bactrim [Sulfamethoxazole-Trimethoprim]     Pt says "it takes his skin pigment off   Lisinopril Other (See Comments)    cough   Symbicort [Budesonide-Formoterol Fumarate] Other (See Comments)    SYMBICORT Patient reports dizziness and trembling/shaky feeling.    Patient Care Team: Jerrol Banana., MD as PCP - General (Family Medicine) Leandrew Koyanagi, MD as Referring Physician (Ophthalmology) Robyne Askew, MD as Referring Physician (Internal Medicine) Dory Horn, MD as Referring Physician (Otolaryngology) Teodoro Spray, MD as Consulting Physician (Cardiology) Garrel Ridgel, [place] as Consulting Physician (Podiatry) Bjorn Loser, MD as Consulting Physician (Urology)   Medications: Outpatient Medications Prior to Visit  Medication Sig   albuterol (PROVENTIL HFA;VENTOLIN HFA) 108 (90 Base) MCG/ACT inhaler Inhale 2 puffs into the lungs every 6 (six) hours as needed for wheezing or shortness of breath.   albuterol (PROVENTIL) (2.5 MG/3ML) 0.083% nebulizer solution Take 2.5 mg by nebulization in the morning and at bedtime.   Albuterol Sulfate, sensor, 108 (90 Base) MCG/ACT AEPB    azithromycin (ZITHROMAX) 250 MG tablet Take 1 tablet by mouth daily.   brimonidine (ALPHAGAN) 0.15 % ophthalmic solution Place 1 drop into both eyes 2 (two) times daily.   camphor-menthol (SARNA) lotion Apply 1 application.  topically as needed for itching.   ciprofloxacin (CIPRO) 500 MG tablet TAKE 1 TABLET (500 MG TOTAL) BY MOUTH TWICE A DAY FOR 10 DAYS (Patient not taking: Reported on 03/06/2022)   Desoximetasone (TOPICORT) 0.25 % ointment APPLY TO ECZEMA OF UPPER ARM TWICE DAILYAS DIRECTED.   diltiazem (CARDIZEM CD) 180 MG 24 hr capsule TAKE 1 CAPSULE BY MOUTH ONCE DAILY (Patient not taking: Reported on 03/06/2022)   dorzolamide (TRUSOPT) 2 % ophthalmic solution Place 1 drop into both eyes 2 (two) times daily.    finasteride (PROSCAR) 5 MG tablet TAKE 1 TABLET BY MOUTH DAILY (Patient taking differently: 5 mg every evening.)   fluticasone (FLONASE) 50 MCG/ACT nasal spray    Fluticasone-Salmeterol (WIXELA INHUB IN) Inhale into the lungs.   ketoconazole (NIZORAL) 2 % shampoo    latanoprost (XALATAN) 0.005 % ophthalmic solution Place 1 drop into both eyes at bedtime.    Melatonin 5 MG TABS Take 5 mg by mouth at bedtime.   mometasone (ELOCON) 0.1 % cream    montelukast (SINGULAIR) 10 MG tablet Take 10 mg by mouth at bedtime.   Sodium Chloride, Inhalant, 7 % NEBU Take 1 vial by nebulization 2 (two) times daily.   tamsulosin (FLOMAX) 0.4 MG CAPS capsule Take 0.8 mg by mouth daily.   tiotropium (SPIRIVA) 18 MCG inhalation capsule Place 18 mcg into inhaler and inhale every evening.   XARELTO 10 MG TABS tablet Take 1 tablet (10 mg total) by mouth daily.   No facility-administered medications prior to visit.    Review of Systems  Constitutional: Negative.   HENT: Negative.    Eyes: Negative.   Respiratory: Negative.    Cardiovascular: Negative.   Gastrointestinal: Negative.   Endocrine: Negative.   Genitourinary:  Positive for difficulty urinating.  Musculoskeletal: Negative.   Skin: Negative.   Allergic/Immunologic: Negative.   Neurological: Negative.   Hematological: Negative.   Psychiatric/Behavioral: Negative.    All other systems reviewed and are negative.  Last metabolic panel Lab Results   Component Value Date   GLUCOSE 94 03/12/2022   NA 137 03/12/2022   K 4.1 03/12/2022   CL 100 03/12/2022   CO2 22 03/12/2022   BUN 13 03/12/2022   CREATININE 1.09 03/12/2022   EGFR 67 03/12/2022   CALCIUM 9.2 03/12/2022   PROT 6.5 03/12/2022   ALBUMIN 4.4 03/12/2022   LABGLOB 2.1 03/12/2022   AGRATIO 2.1 03/12/2022   BILITOT 0.5 03/12/2022   ALKPHOS 136 (H) 03/12/2022   AST 27 03/12/2022   ALT 22 03/12/2022   ANIONGAP 7 04/29/2018      Objective    There were no vitals taken for this visit. Vitals:   03/12/22 1539 03/12/22 1543  BP: (!) 156/82 (!) 144/74  Pulse: 77   Temp: 98 F (36.7 C)   TempSrc: Oral   SpO2: 95%   Weight: 164 lb (74.4 kg)       Physical Exam Constitutional:      Appearance: Normal appearance. He is normal weight.  HENT:     Head: Normocephalic and atraumatic.     Right Ear: Tympanic membrane, ear canal and external ear normal.     Left Ear: Tympanic membrane, ear canal and external ear normal.     Nose: Nose normal.     Mouth/Throat:     Mouth: Mucous membranes are moist.     Pharynx: Oropharynx is clear.  Eyes:     Extraocular Movements: Extraocular movements intact.     Conjunctiva/sclera: Conjunctivae normal.     Pupils: Pupils are equal, round, and reactive to light.  Cardiovascular:     Rate and Rhythm: Normal rate and regular rhythm.     Pulses: Normal pulses.     Heart sounds: Normal heart sounds.  Pulmonary:     Effort: Pulmonary effort is normal.     Breath sounds: Normal breath sounds.  Abdominal:     General: Abdomen is flat. Bowel sounds are normal.     Palpations: Abdomen is soft.  Musculoskeletal:        General: Normal range of motion.     Cervical back: Normal range of motion and neck supple.  Skin:    General: Skin is warm and dry.  Neurological:     General: No focal deficit present.     Mental Status: He is alert and oriented to person, place, and time. Mental status is at baseline.  Psychiatric:         Mood and Affect: Mood normal.        Behavior: Behavior normal.        Thought Content: Thought content normal.        Judgment: Judgment normal.       Last depression screening scores    03/06/2022    2:03 PM 11/07/2021    3:40 PM 07/18/2021    9:10 AM  PHQ 2/9 Scores  PHQ - 2 Score 0 0 0  PHQ- 9 Score 0  0   Last fall risk screening    03/06/2022    2:05 PM  [place] in the past year? 0  Number falls in past yr: 0  Injury with Fall? 0  Risk for fall due to : No Fall Risks  Follow up Falls prevention discussed;Falls evaluation completed   Last Audit-C alcohol use screening    03/06/2022    2:02 PM  Alcohol Use Disorder Test (AUDIT)  1. How often do you have a  drink containing alcohol? 3  2. How many drinks containing alcohol do you have on a typical day when you are drinking? 0  3. How often do you have six or more drinks on one occasion? 0  AUDIT-C Score 3  4. How often during the last year have you found that you were not able to stop drinking once you had started? 0  5. How often during the last year have you failed to do what was normally expected from you because of drinking? 0  6. How often during the last year have you needed a first drink in the morning to get yourself going after a heavy drinking session? 0  7. How often during the last year have you had a feeling of guilt of remorse after drinking? 0  8. How often during the last year have you been unable to remember what happened the night before because you had been drinking? 0  9. Have you or someone else been injured as a result of your drinking? 0  10. Has a relative or friend or a doctor or another health worker been concerned about your drinking or suggested you cut down? 0  Alcohol Use Disorder Identification Test Final Score (AUDIT) 3   A score of 3 or more in women, and 4 or more in men indicates increased risk for alcohol abuse, EXCEPT if all of the points are from question 1   No results  found for any visits on 03/12/22.  Assessment & Plan    Routine Health Maintenance and Physical Exam  Exercise Activities and Dietary recommendations  Goals      DIET - EAT MORE FRUITS AND VEGETABLES        Immunization History  Administered Date(s) Administered   Fluad Quad(high Dose 65+) 02/08/2019, 03/02/2020   Influenza Split 03/02/2012, 03/17/2013, 05/07/2014, 02/23/2018   Influenza Whole 03/24/2007, 03/15/2009, 02/15/2010   Influenza, High Dose Seasonal PF 05/04/2015, 05/04/2015, 03/21/2016, 03/21/2016, 03/24/2017, 03/24/2017   Influenza,inj,Quad PF,6+ Mos 03/14/2021   Influenza-Unspecified 02/23/2018   PFIZER(Purple Top)SARS-COV-2 Vaccination 06/28/2019, 07/19/2019, 03/20/2020   Pfizer Covid-19 Vaccine Bivalent Booster 69yr & up 03/16/2021   Pneumococcal Conjugate-13 11/17/2013   Pneumococcal Polysaccharide-23 11/19/2004, 03/19/2007, 03/17/2009, 02/09/2020   Td 05/16/1997, 05/14/2007, 09/11/2010   Td (Adult), 2 Lf Tetanus Toxid, Preservative Free 05/16/1997, 05/14/2007, 09/11/2010   Tdap 03/14/2021   Zoster Recombinat (Shingrix) 02/23/2018, 04/28/2018   Zoster, Live 01/02/2011    Health Maintenance  Topic Date Due   COVID-19 Vaccine (5 - Pfizer risk series) 05/11/2021   INFLUENZA VACCINE  01/15/2022   TETANUS/TDAP  03/15/2031   Pneumonia Vaccine 81 Years old  Completed   Zoster Vaccines- Shingrix  Completed   HPV VACCINES  Aged Out    Discussed health benefits of physical activity, and encouraged him to engage in regular exercise appropriate for his age and condition.  1. Annual physical exam Overall health has been pretty good during the COVID span he is avoided the recurrent pneumonia/pneumonitis that he had before  2. Need for influenza vaccination  - Flu Vaccine QUAD High Dose(Fluad)  3. BPH associated with nocturia Refer to urology at his request.  He has a specific urologist if there is a specific procedure he would like to investigate - PSA  4.  CAFL (chronic airflow limitation) (HCC) Niccoli stable  5. Essential hypertension Controlled - TSH - Comprehensive Metabolic Panel (CMET)  6. Malnutrition of mild degree (HCC)  - Comprehensive Metabolic Panel (CMET)  7. Community acquired pneumonia, unspecified  laterality Patient has not had an episode of pneumonia for several years - CBC w/Diff/Platelet   No follow-ups on file.     I, Wilhemena Durie, MD, have reviewed all documentation for this visit. The documentation on 03/14/22 for the exam, diagnosis, procedures, and orders are all accurate and complete.    Jaqulyn Chancellor Cranford Mon, MD  [facility] [phone] (phone) [phone] (fax)  [place]

## 2022-03-13 LAB — COMPREHENSIVE METABOLIC PANEL
ALT: 22 IU/L (ref 0–44)
AST: 27 IU/L (ref 0–40)
Albumin/Globulin Ratio: 2.1 (ref 1.2–2.2)
Albumin: 4.4 g/dL (ref 3.7–4.7)
Alkaline Phosphatase: 136 IU/L — ABNORMAL HIGH (ref 44–121)
BUN/Creatinine Ratio: 12 (ref 10–24)
BUN: 13 mg/dL (ref 8–27)
Bilirubin Total: 0.5 mg/dL (ref 0.0–1.2)
CO2: 22 mmol/L (ref 20–29)
Calcium: 9.2 mg/dL (ref 8.6–10.2)
Chloride: 100 mmol/L (ref 96–106)
Creatinine, Ser: 1.09 mg/dL (ref 0.76–1.27)
Globulin, Total: 2.1 g/dL (ref 1.5–4.5)
Glucose: 94 mg/dL (ref 70–99)
Potassium: 4.1 mmol/L (ref 3.5–5.2)
Sodium: 137 mmol/L (ref 134–144)
Total Protein: 6.5 g/dL (ref 6.0–8.5)
eGFR: 67 mL/min/{1.73_m2} (ref >59)

## 2022-03-13 LAB — CBC WITH DIFFERENTIAL/PLATELET
Basophils Absolute: 0.1 10*3/uL (ref 0.0–0.2)
Basos: 1 %
EOS (ABSOLUTE): 0.1 10*3/uL (ref 0.0–0.4)
Eos: 1 %
Hematocrit: 45.5 % (ref 37.5–51.0)
Hemoglobin: 15.7 g/dL (ref 13.0–17.7)
Immature Grans (Abs): 0 10*3/uL (ref 0.0–0.1)
Immature Granulocytes: 1 %
Lymphocytes Absolute: 1.7 10*3/uL (ref 0.7–3.1)
Lymphs: 20 %
MCH: 32.7 pg (ref 26.6–33.0)
MCHC: 34.5 g/dL (ref 31.5–35.7)
MCV: 95 fL (ref 79–97)
Monocytes Absolute: 1.1 10*3/uL — ABNORMAL HIGH (ref 0.1–0.9)
Monocytes: 13 %
Neutrophils Absolute: 5.3 10*3/uL (ref 1.4–7.0)
Neutrophils: 64 %
Platelets: 227 10*3/uL (ref 150–450)
RBC: 4.8 x10E6/uL (ref 4.14–5.80)
RDW: 12.7 % (ref 11.6–15.4)
WBC: 8.3 10*3/uL (ref 3.4–10.8)

## 2022-03-13 LAB — TSH: TSH: 1.47 u[IU]/mL (ref 0.450–4.500)

## 2022-03-13 LAB — PSA: Prostate Specific Ag, Serum: 3 ng/mL (ref 0.0–4.0)

## 2022-03-15 DIAGNOSIS — Z09 Encounter for follow-up examination after completed treatment for conditions other than malignant neoplasm: Secondary | ICD-10-CM | POA: Diagnosis not present

## 2022-03-15 DIAGNOSIS — Z8709 Personal history of other diseases of the respiratory system: Secondary | ICD-10-CM | POA: Diagnosis not present

## 2022-03-19 DIAGNOSIS — H401113 Primary open-angle glaucoma, right eye, severe stage: Secondary | ICD-10-CM | POA: Diagnosis not present

## 2022-03-20 ENCOUNTER — Telehealth: Payer: Self-pay | Admitting: Family Medicine

## 2022-03-20 DIAGNOSIS — N401 Enlarged prostate with lower urinary tract symptoms: Secondary | ICD-10-CM

## 2022-03-20 NOTE — Telephone Encounter (Signed)
Referral Request - Has patient seen PCP for this complaint? Yes.   *If NO, is insurance requiring patient see PCP for this issue before PCP can refer them? Referral for which specialty: prostate issues Preferred provider/office: [facility] Reason for referral: he spoke to Dr Rosanna Randy last week.  Fax [phone]  Pt asked Sharyn Lull to call him back.  He said she knows about this.

## 2022-03-21 ENCOUNTER — Ambulatory Visit: Payer: PPO | Admitting: Dermatology

## 2022-03-21 DIAGNOSIS — Z1283 Encounter for screening for malignant neoplasm of skin: Secondary | ICD-10-CM | POA: Diagnosis not present

## 2022-03-21 DIAGNOSIS — L578 Other skin changes due to chronic exposure to nonionizing radiation: Secondary | ICD-10-CM | POA: Diagnosis not present

## 2022-03-21 DIAGNOSIS — L280 Lichen simplex chronicus: Secondary | ICD-10-CM | POA: Diagnosis not present

## 2022-03-21 DIAGNOSIS — L821 Other seborrheic keratosis: Secondary | ICD-10-CM

## 2022-03-21 DIAGNOSIS — Z85828 Personal history of other malignant neoplasm of skin: Secondary | ICD-10-CM

## 2022-03-21 DIAGNOSIS — L2081 Atopic neurodermatitis: Secondary | ICD-10-CM

## 2022-03-21 DIAGNOSIS — Z79899 Other long term (current) drug therapy: Secondary | ICD-10-CM | POA: Diagnosis not present

## 2022-03-21 DIAGNOSIS — D229 Melanocytic nevi, unspecified: Secondary | ICD-10-CM

## 2022-03-21 DIAGNOSIS — L80 Vitiligo: Secondary | ICD-10-CM

## 2022-03-21 DIAGNOSIS — B353 Tinea pedis: Secondary | ICD-10-CM | POA: Diagnosis not present

## 2022-03-21 DIAGNOSIS — L814 Other melanin hyperpigmentation: Secondary | ICD-10-CM

## 2022-03-21 DIAGNOSIS — L820 Inflamed seborrheic keratosis: Secondary | ICD-10-CM | POA: Diagnosis not present

## 2022-03-21 NOTE — Progress Notes (Signed)
Follow-Up Visit   Subjective  [name] is a 81 y.o. male who presents for the following: Follow-up (6 month , reports a bump at back of scalp that hurts. ). The patient presents for Total-Body Skin Exam (TBSE) for skin cancer screening and mole check.  The patient has spots, moles and lesions to be evaluated, some may be new or changing and the patient has concerns that these could be cancer.  The following portions of the chart were reviewed this encounter and updated as appropriate:  Tobacco  Allergies  Meds  Problems  Med Hx  Surg Hx  Fam Hx     Review of Systems: No other skin or systemic complaints except as noted in HPI or Assessment and Plan.  Objective  Well appearing patient in no apparent distress; mood and affect are within normal limits.  A full examination was performed including scalp, head, eyes, ears, nose, lips, neck, chest, axillae, abdomen, back, buttocks, bilateral upper extremities, bilateral lower extremities, hands, feet, fingers, toes, fingernails, and toenails. All findings within normal limits unless otherwise noted below.  b/l feet Scaling and maceration web spaces and over distal and lateral soles.   Scalp x 1 Erythematous stuck-on, waxy papule or plaque   Assessment & Plan  Vitiligo With pruritis  Back; trunk Postinflammatory hypopigmentation secondary to Bactrim allergic reaction vs Vitiligo -  patient declines treatment today.   Vitiligo is a chronic autoimmune condition which causes loss of skin pigment and is commonly seen on the face and may also involve areas of trauma like hands, elbows, knees, and ankles. There is no cure and it is difficult to treat.  Treatments include topical steroids and other topical anti-inflammatory ointments/creams and topical and oral Jak inhibitors.  Sometimes narrow band UV light therapy or Xtrac laser is helpful, both of which require twice weekly treatments for at least 3-6 months.  Antioxidant vitamins,  such as Vitamins A,C,E,D, Folic Acid and B12 may be added to enhance treatment.  Tinea pedis of both feet b/l feet Chronic and persistent condition with duration or expected duration over one year. Condition is symptomatic / bothersome to patient. Not to goal.  Ketoconazole 2% cream qhs   Atopic Neurodermatitis Left Forearm - Anterior Left Upper Arm - Anterior  Atopic dermatitis (eczema) is a chronic, relapsing, pruritic condition that can significantly affect quality of life. It is often associated with allergic rhinitis and/or asthma and can require treatment with topical medications, phototherapy, or in severe cases biologic injectable medication (Dupixent; Adbry) or Oral JAK inhibitors.    Start Eucrisa oint qd/bid aa eczema on arms and shoulders. (Opzelura not covered) samples x 5 Lot SPCX exp 11/24  Inflamed seborrheic keratosis Scalp x 1 Symptomatic, irritating, patient would like treated. Destruction of lesion - Scalp x 1 Complexity: simple   Destruction method: cryotherapy   Informed consent: discussed and consent obtained   Timeout:  patient name, date of birth, surgical site, and procedure verified Lesion destroyed using liquid nitrogen: Yes   Region frozen until ice ball extended beyond lesion: Yes   Outcome: patient tolerated procedure well with no complications   Post-procedure details: wound care instructions given   Additional details:  Prior to procedure, discussed risks of blister formation, small wound, skin dyspigmentation, or rare scar following cryotherapy. Recommend Vaseline ointment to treated areas while healing.  Lentigines - Scattered tan macules - Due to sun exposure - Benign-appearing, observe - Recommend daily broad spectrum sunscreen SPF 30+ to sun-exposed areas, reapply  every 2 hours as needed. - Call for any changes  Seborrheic Keratoses - Stuck-on, waxy, tan-brown papules and/or plaques  - Benign-appearing - Discussed benign etiology and  prognosis. - Observe - Call for any changes  Melanocytic Nevi - Tan-brown and/or pink-flesh-colored symmetric macules and papules - Benign appearing on exam today - Observation - Call clinic for new or changing moles - Recommend daily use of broad spectrum spf 30+ sunscreen to sun-exposed areas.   Hemangiomas - Red papules - Discussed benign nature - Observe - Call for any changes  Actinic Damage - Chronic condition, secondary to cumulative UV/sun exposure - diffuse scaly erythematous macules with underlying dyspigmentation - Recommend daily broad spectrum sunscreen SPF 30+ to sun-exposed areas, reapply every 2 hours as needed.  - Staying in the shade or wearing long sleeves, sun glasses (UVA+UVB protection) and wide brim hats (4-inch brim around the entire circumference of the hat) are also recommended for sun protection.  - Call for new or changing lesions. Venous lake on right ear  History of Basal Cell Carcinoma of the Skin - No evidence of recurrence today - Recommend regular full body skin exams - Recommend daily broad spectrum sunscreen SPF 30+ to sun-exposed areas, reapply every 2 hours as needed.  - Call if any new or changing lesions are noted between office visits  Skin cancer screening performed today. Return in about 1 year (around 03/22/2023) for TBSE. IRuthell Rummage, CMA, am acting as scribe for Sarina Ser, MD. Documentation: I have reviewed the above documentation for accuracy and completeness, and I agree with the above.  Sarina Ser, MD

## 2022-03-21 NOTE — Telephone Encounter (Signed)
Referral for IR submitted for [facility]   Eulis Foster, MD

## 2022-03-21 NOTE — Patient Instructions (Addendum)
Seborrheic Keratosis  What causes seborrheic keratoses? Seborrheic keratoses are harmless, common skin growths that first appear during adult life.  As time goes by, more growths appear.  Some people may develop a large number of them.  Seborrheic keratoses appear on both covered and uncovered body parts.  They are not caused by sunlight.  The tendency to develop seborrheic keratoses can be inherited.  They vary in color from skin-colored to gray, brown, or even black.  They can be either smooth or have a rough, warty surface.   Seborrheic keratoses are superficial and look as if they were stuck on the skin.  Under the microscope this type of keratosis looks like layers upon layers of skin.  That is why at times the top layer may seem to fall off, but the rest of the growth remains and re-grows.    Treatment Seborrheic keratoses do not need to be treated, but can easily be removed in the office.  Seborrheic keratoses often cause symptoms when they rub on clothing or jewelry.  Lesions can be in the way of shaving.  If they become inflamed, they can cause itching, soreness, or burning.  Removal of a seborrheic keratosis can be accomplished by freezing, burning, or surgery. If any spot bleeds, scabs, or grows rapidly, please return to have it checked, as these can be an indication of a skin cancer.    Melanoma ABCDEs  Melanoma is the most dangerous type of skin cancer, and is the leading cause of death from skin disease.  You are more likely to develop melanoma if you: Have light-colored skin, light-colored eyes, or red or blond hair Spend a lot of time in the sun Tan regularly, either outdoors or in a tanning bed Have had blistering sunburns, especially during childhood Have a close family member who has had a melanoma Have atypical moles or large birthmarks  Early detection of melanoma is key since treatment is typically straightforward and cure rates are extremely high if we catch it early.    The first sign of melanoma is often a change in a mole or a new dark spot.  The ABCDE system is a way of remembering the signs of melanoma.  A for asymmetry:  The two halves do not match. B for border:  The edges of the growth are irregular. C for color:  A mixture of colors are present instead of an even brown color. D for diameter:  Melanomas are usually (but not always) greater than 6mm - the size of a pencil eraser. E for evolution:  The spot keeps changing in size, shape, and color.  Please check your skin once per month between visits. You can use a small mirror in front and a large mirror behind you to keep an eye on the back side or your body.   If you see any new or changing lesions before your next follow-up, please call to schedule a visit.  Please continue daily skin protection including broad spectrum sunscreen SPF 30+ to sun-exposed areas, reapplying every 2 hours as needed when you're outdoors.   Staying in the shade or wearing long sleeves, sun glasses (UVA+UVB protection) and wide brim hats (4-inch brim around the entire circumference of the hat) are also recommended for sun protection.    Due to recent changes in healthcare laws, you may see results of your pathology and/or laboratory studies on MyChart before the doctors have had a chance to review them. We understand that in some cases there   may be results that are confusing or concerning to you. Please understand that not all results are received at the same time and often the doctors may need to interpret multiple results in order to provide you with the best plan of care or course of treatment. Therefore, we ask that you please give us 2 business days to thoroughly review all your results before contacting the office for clarification. Should we see a critical lab result, you will be contacted sooner.   If You Need Anything After Your Visit  If you have any questions or concerns for your doctor, please call our main  line at [phone] and press option 4 to reach your doctor's medical assistant. If no one answers, please leave a voicemail as directed and we will return your call as soon as possible. Messages left after 4 pm will be answered the following business day.   You may also send us a message via MyChart. We typically respond to MyChart messages within 1-2 business days.  For prescription refills, please ask your pharmacy to contact our office. Our fax number is [phone].  If you have an urgent issue when the clinic is closed that cannot wait until the next business day, you can page your doctor at the number below.    Please note that while we do our best to be available for urgent issues outside of office hours, we are not available 24/7.   If you have an urgent issue and are unable to reach us, you may choose to seek medical care at your doctor's office, retail clinic, urgent care center, or emergency room.  If you have a medical emergency, please immediately call 911 or go to the emergency department.  Pager Numbers  - Dr. Kowalski: [phone]  - Dr. Moye: [phone]  - Dr. Stewart: [phone]  In the event of inclement weather, please call our main line at [phone] for an update on the status of any delays or closures.  Dermatology Medication Tips: Please keep the boxes that topical medications come in in order to help keep track of the instructions about where and how to use these. Pharmacies typically print the medication instructions only on the boxes and not directly on the medication tubes.   If your medication is too expensive, please contact our office at [phone] option 4 or send us a message through MyChart.   We are unable to tell what your co-pay for medications will be in advance as this is different depending on your insurance coverage. However, we may be able to find a substitute medication at lower cost or fill out paperwork to get insurance to cover a  needed medication.   If a prior authorization is required to get your medication covered by your insurance company, please allow us 1-2 business days to complete this process.  Drug prices often vary depending on where the prescription is filled and some pharmacies may offer cheaper prices.  The website www.goodrx.com contains coupons for medications through different pharmacies. The prices here do not account for what the cost may be with help from insurance (it may be cheaper with your insurance), but the website can give you the price if you did not use any insurance.  - You can print the associated coupon and take it with your prescription to the pharmacy.  - You may also stop by our office during regular business hours and pick up a GoodRx coupon card.  - If you need your prescription sent electronically to   a different pharmacy, notify our office through [facility] MyChart or by phone at [phone] option 4.     Si Usted Necesita Algo Despus de Su Visita  Tambin puede enviarnos un mensaje a travs de MyChart. Por lo general respondemos a los mensajes de MyChart en el transcurso de 1 a 2 das hbiles.  Para renovar recetas, por favor pida a su farmacia que se ponga en contacto con nuestra oficina. Nuestro nmero de fax es el [phone].  Si tiene un asunto urgente cuando la clnica est cerrada y que no puede esperar hasta el siguiente da hbil, puede llamar/localizar a su doctor(a) al nmero que aparece a continuacin.   Por favor, tenga en cuenta que aunque hacemos todo lo posible para estar disponibles para asuntos urgentes fuera del horario de oficina, no estamos disponibles las 24 horas del da, los 7 das de la semana.   Si tiene un problema urgente y no puede comunicarse con nosotros, puede optar por buscar atencin mdica  en el consultorio de su doctor(a), en una clnica privada, en un centro de atencin urgente o en una sala de emergencias.  Si tiene una emergencia  mdica, por favor llame inmediatamente al 911 o vaya a la sala de emergencias.  Nmeros de bper  - Dr. Kowalski: [phone]  - Dra. Moye: [phone]  - Dra. Stewart: [phone]  En caso de inclemencias del tiempo, por favor llame a nuestra lnea principal al [phone] para una actualizacin sobre el estado de cualquier retraso o cierre.  Consejos para la medicacin en dermatologa: Por favor, guarde las cajas en las que vienen los medicamentos de uso tpico para ayudarle a seguir las instrucciones sobre dnde y cmo usarlos. Las farmacias generalmente imprimen las instrucciones del medicamento slo en las cajas y no directamente en los tubos del medicamento.   Si su medicamento es muy caro, por favor, pngase en contacto con nuestra oficina llamando al [phone] y presione la opcin 4 o envenos un mensaje a travs de MyChart.   No podemos decirle cul ser su copago por los medicamentos por adelantado ya que esto es diferente dependiendo de la cobertura de su seguro. Sin embargo, es posible que podamos encontrar un medicamento sustituto a menor costo o llenar un formulario para que el seguro cubra el medicamento que se considera necesario.   Si se requiere una autorizacin previa para que su compaa de seguros cubra su medicamento, por favor permtanos de 1 a 2 das hbiles para completar este proceso.  Los precios de los medicamentos varan con frecuencia dependiendo del lugar de dnde se surte la receta y alguna farmacias pueden ofrecer precios ms baratos.  El sitio web www.goodrx.com tiene cupones para medicamentos de diferentes farmacias. Los precios aqu no tienen en cuenta lo que podra costar con la ayuda del seguro (puede ser ms barato con su seguro), pero el sitio web puede darle el precio si no utiliz ningn seguro.  - Puede imprimir el cupn correspondiente y llevarlo con su receta a la farmacia.  - Tambin puede pasar por nuestra oficina durante el horario de  atencin regular y recoger una tarjeta de cupones de GoodRx.  - Si necesita que su receta se enve electrnicamente a una farmacia diferente, informe a nuestra oficina a travs de MyChart de [facility] o por telfono llamando al [phone] y presione la opcin 4.  

## 2022-03-26 ENCOUNTER — Encounter: Payer: Self-pay | Admitting: Dermatology

## 2022-04-02 DIAGNOSIS — T363X5A Adverse effect of macrolides, initial encounter: Secondary | ICD-10-CM | POA: Diagnosis not present

## 2022-04-02 DIAGNOSIS — Z136 Encounter for screening for cardiovascular disorders: Secondary | ICD-10-CM | POA: Diagnosis not present

## 2022-04-15 DIAGNOSIS — N401 Enlarged prostate with lower urinary tract symptoms: Secondary | ICD-10-CM | POA: Diagnosis not present

## 2022-04-19 ENCOUNTER — Telehealth: Payer: Self-pay | Admitting: Family Medicine

## 2022-04-19 DIAGNOSIS — N401 Enlarged prostate with lower urinary tract symptoms: Secondary | ICD-10-CM

## 2022-04-19 MED ORDER — FINASTERIDE 5 MG PO TABS: 5.0000 mg | ORAL_TABLET | Freq: Every day | ORAL | 0 refills | Status: DC

## 2022-04-19 NOTE — Telephone Encounter (Signed)
[facility] pharmacy faxed refill request for the following medications:  finasteride (PROSCAR) 5 MG tablet   Please advise

## 2022-05-06 ENCOUNTER — Ambulatory Visit: Payer: PPO | Admitting: Podiatry

## 2022-05-06 ENCOUNTER — Encounter: Payer: Self-pay | Admitting: Podiatry

## 2022-05-06 DIAGNOSIS — M778 Other enthesopathies, not elsewhere classified: Secondary | ICD-10-CM | POA: Diagnosis not present

## 2022-05-06 MED ORDER — TRIAMCINOLONE ACETONIDE 40 MG/ML IJ SUSP
20.0000 mg | Freq: Once | INTRAMUSCULAR | Status: AC
  Administered 2022-05-06: 20 mg

## 2022-05-06 NOTE — Progress Notes (Signed)
[name] presents today for follow-up of his capsulitis and bursitis to the fourth intermetatarsal space of his right foot.  Objective: Pulses remain palpable right foot.  He has pain on range of motion of the fourth fifth tarsometatarsal joint and the intermetatarsal joint.  Most likely consistent with bursitis capsulitis or osteoarthritis.  Assessment: Capsulitis osteoarthritis right foot fourth intermetatarsal space proximally.  Plan: I injected that area today 20 mg of Kenalog 5 mg Marcaine point maximal tenderness.  Tolerated procedure well without complications.

## 2022-05-08 DIAGNOSIS — N320 Bladder-neck obstruction: Secondary | ICD-10-CM | POA: Diagnosis not present

## 2022-05-08 DIAGNOSIS — I771 Stricture of artery: Secondary | ICD-10-CM | POA: Diagnosis not present

## 2022-05-08 DIAGNOSIS — N401 Enlarged prostate with lower urinary tract symptoms: Secondary | ICD-10-CM | POA: Diagnosis not present

## 2022-06-03 ENCOUNTER — Ambulatory Visit (INDEPENDENT_AMBULATORY_CARE_PROVIDER_SITE_OTHER): Payer: PPO | Admitting: Urology

## 2022-06-03 VITALS — BP 121/70 | HR 84 | Wt 167.2 lb

## 2022-06-03 DIAGNOSIS — R351 Nocturia: Secondary | ICD-10-CM

## 2022-06-03 MED ORDER — MIRABEGRON ER 50 MG PO TB24: 50.0000 mg | ORAL_TABLET | Freq: Every day | ORAL | 11 refills | Status: DC

## 2022-06-03 NOTE — Addendum Note (Signed)
Addended by: Kris Mouton on: 06/03/2022 09:43 AM   Modules accepted: Orders

## 2022-06-03 NOTE — Progress Notes (Signed)
06/03/2022 8:21 AM   [name] 20-Feb-1941 [id number]  Referring provider: Jerrol Banana., MD No address on file  Chief Complaint  Patient presents with   Nocturia    Follow up    HPI: I was consulted to assess the patient's acute onset of nighttime frequency every 2 hours.  It is now every 3 hours.  He stopped gabapentin which had been added and he had leg edema.  Right leg is normalized.  Left leg much improved.  He is still using TED hose.  Flow is reasonable.  Frequency every 2 hours during the day.   Patient nighttime frequency likely from fluid mobilization.  Role of alpha-blocker versus watchful waiting described.  Patient has an ill-defined right middle discomfort that I did not think was from the genitourinary system.  It comes and goes and is been present for a few weeks.  We did not x-ray him today.  He would like to try Flomax.  Reassess in 6 weeks.  If he normalizes we might want to try him off it in the future.   Today Flow is much better on the medication but still gets up every 2 hours to void.  No infections or blood   Today Flow was good.  Mild frequency and nocturia stable.  No blood.  No infections Small benign prostate The patient want to try 2 tablets a day because of his nocturia every 2 hours     TOday Frequency stable.  Still gets about every 2 hours at night.  Sometimes flow is weak.  Not interested in surgery patient on Flomax 0.8 mg  Today Frequency stable.  Still goes up every 90 minutes to 2 hours at night.  He had a embolization procedure on his prostate 3 weeks ago in [place] but it did not help.  Clinically not infected.       PMH: Past Medical History:  Diagnosis Date   AB (asthmatic bronchitis)    Abnormal liver enzymes    ABPA (allergic bronchopulmonary aspergillosis) (HCC)    Allergic rhinitis    Aortic atherosclerosis ([facility])    Asthma    CAP (community acquired pneumonia)    Cellulitis    COPD (chronic obstructive  pulmonary disease) ([facility])    Coronary artery disease    a.) LHC 08/19/2016: 50% pRCA; intervention deferred opting for med. mgmt.   Diastolic dysfunction    a.) TTE 08/18/2016: EF 50%; inf HK; mild BAE, mild RV enlargement; G1DD   Diverticulitis    DVT (deep venous thrombosis) ([place]) 08/20/2016   a.) Doppler 08/20/2016: chronic appearing; occlusive DVT LEFT popliteal vein, with additional nearly occlusive DVT in the overlying distal SFV.   Dyspnea    GERD (gastroesophageal reflux disease)    Glaucoma    Headache    History of sepsis    HLD (hyperlipidemia)    Hx MRSA infection    Hx of basal cell carcinoma 02/26/2018   L lat mid back   Hypertension    Resolved - no meds   Insomnia    a.) takes malatonin   NSTEMI (non-ST elevated myocardial infarction) ([place]) 08/18/2016   a.) LHC 08/19/2016: 50% pRCA; intervention deferred opting for med. mgmt.   Osteoarthritis    Paroxysmal tachycardia (HCC)    PVC (premature ventricular contraction)     Surgical History: Past Surgical History:  Procedure Laterality Date   APPENDECTOMY     BRONCHOSCOPY     CATARACT EXTRACTION W/PHACO Left 09/01/2019  Procedure: CATARACT EXTRACTION PHACO AND INTRAOCULAR LENS PLACEMENT (IOC) LEFT 11.72 01:12.2 16.2%;  Surgeon: Leandrew Koyanagi, MD;  Location: [facility];  Service: Ophthalmology;  Laterality: Left;   CATARACT EXTRACTION W/PHACO Right 09/22/2019   Procedure: CATARACT EXTRACTION PHACO AND INTRAOCULAR LENS PLACEMENT ([facility]) RIGHT;  Surgeon: Leandrew Koyanagi, MD;  Location: [facility];  Service: Ophthalmology;  Laterality: Right;  6.88 103.4 10.9%   GANGLION CYST EXCISION Left 10/19/2021   Procedure: REMOVAL GANGLION OF WRIST;  Surgeon: Earnestine Leys, MD;  Location: [facility];  Service: Orthopedics;  Laterality: Left;   LEFT HEART CATH AND CORONARY ANGIOGRAPHY N/A 08/19/2016   Procedure: Left Heart Cath and Coronary Angiography;  Surgeon: Dionisio David, MD;  Location: [facility];  Service: Cardiovascular;  Laterality: N/A;   NASAL SINUS SURGERY  09/27/2014    Home Medications:  Allergies as of 06/03/2022       Reactions   Timolol Maleate [timolol Maleate] Shortness Of Breath   Atorvastatin Swelling   Swelling in legs, dizziness   Bactrim [sulfamethoxazole-trimethoprim]    Pt says "it takes his skin pigment off   Lisinopril Other (See Comments)   cough   Symbicort [budesonide-formoterol Fumarate] Other (See Comments)   SYMBICORT Patient reports dizziness and trembling/shaky feeling.        Medication List        Accurate as of June 03, 2022  8:21 AM. If you have any questions, ask your nurse or doctor.          albuterol 108 (90 Base) MCG/ACT inhaler Commonly known as: VENTOLIN HFA Inhale 2 puffs into the lungs every 6 (six) hours as needed for wheezing or shortness of breath.   albuterol (2.5 MG/3ML) 0.083% nebulizer solution Commonly known as: PROVENTIL Take 2.5 mg by nebulization in the morning and at bedtime.   Albuterol Sulfate (sensor) 108 (90 Base) MCG/ACT Aepb   azithromycin 250 MG tablet Commonly known as: ZITHROMAX Take 1 tablet by mouth daily.   brimonidine 0.15 % ophthalmic solution Commonly known as: ALPHAGAN Place 1 drop into both eyes 2 (two) times daily.   camphor-menthol lotion Commonly known as: SARNA Apply 1 application. topically as needed for itching.   Desoximetasone 0.25 % ointment Commonly known as: TOPICORT APPLY TO ECZEMA OF UPPER ARM TWICE DAILYAS DIRECTED.   diltiazem 180 MG 24 hr capsule Commonly known as: CARDIZEM CD TAKE 1 CAPSULE BY MOUTH ONCE DAILY   dorzolamide 2 % ophthalmic solution Commonly known as: TRUSOPT Place 1 drop into both eyes 2 (two) times daily.   famotidine 20 MG tablet Commonly known as: PEPCID Take 20 mg by mouth daily.   finasteride 5 MG tablet Commonly known as: PROSCAR Take 1 tablet (5 mg total) by mouth daily.   fluticasone 50 MCG/ACT nasal  spray Commonly known as: FLONASE   ketoconazole 2 % shampoo Commonly known as: NIZORAL   latanoprost 0.005 % ophthalmic solution Commonly known as: XALATAN Place 1 drop into both eyes at bedtime.   melatonin 5 MG Tabs Take 5 mg by mouth at bedtime.   mometasone 0.1 % cream Commonly known as: ELOCON   montelukast 10 MG tablet Commonly known as: SINGULAIR Take 10 mg by mouth at bedtime.   omeprazole 20 MG capsule Commonly known as: PRILOSEC Take 20 mg by mouth daily.   phenazopyridine 200 MG tablet Commonly known as: PYRIDIUM Take 200 mg by mouth 3 (three) times daily as needed.   Sodium Chloride (Inhalant) 7 % Nebu Take  1 vial by nebulization 2 (two) times daily.   tamsulosin 0.4 MG Caps capsule Commonly known as: FLOMAX Take 0.8 mg by mouth daily.   tiotropium 18 MCG inhalation capsule Commonly known as: SPIRIVA Place 18 mcg into inhaler and inhale every evening.   WIXELA INHUB IN Inhale into the lungs.   Xarelto 10 MG Tabs tablet Generic drug: rivaroxaban Take 1 tablet (10 mg total) by mouth daily.        Allergies:  Allergies  Allergen Reactions   Timolol Maleate [Timolol Maleate] Shortness Of Breath   Atorvastatin Swelling    Swelling in legs, dizziness   Bactrim [Sulfamethoxazole-Trimethoprim]     Pt says "it takes his skin pigment off   Lisinopril Other (See Comments)    cough   Symbicort [Budesonide-Formoterol Fumarate] Other (See Comments)    SYMBICORT Patient reports dizziness and trembling/shaky feeling.    Family History: Family History  Problem Relation Age of Onset   Breast cancer Sister    Heart failure Mother    Pancreatic cancer Sister    Alzheimer's disease Father    Brain cancer Daughter     Social History:  reports that he quit smoking about 64 years ago. His smoking use included cigarettes. He has a 1.50 pack-year smoking history. He has never used smokeless tobacco. He reports current alcohol use of about 3.0 - 4.0 standard  drinks of alcohol per week. He reports that he does not use drugs.  ROS:                                        Physical Exam: There were no vitals taken for this visit.  Constitutional:  Alert and oriented, No acute distress. HEENT: [place] AT, moist mucus membranes.  Trachea midline, no masses.   Laboratory Data: Lab Results  Component Value Date   WBC 8.3 03/12/2022   HGB 15.7 03/12/2022   HCT 45.5 03/12/2022   MCV 95 03/12/2022   PLT 227 03/12/2022    Lab Results  Component Value Date   CREATININE 1.09 03/12/2022    Lab Results  Component Value Date   PSA 3.6 11/17/2013    No results found for: "TESTOSTERONE"  Lab Results  Component Value Date   HGBA1C 5.4 08/18/2016    Urinalysis    Component Value Date/Time   COLORURINE YELLOW (A) 04/29/2018 1335   APPEARANCEUR CLEAR (A) 04/29/2018 1335   APPEARANCEUR Clear 01/26/2018 1303   LABSPEC 1.014 04/29/2018 1335   LABSPEC 1.012 04/07/2014 1745   PHURINE 7.0 04/29/2018 1335   GLUCOSEU NEGATIVE 04/29/2018 1335   GLUCOSEU Negative 04/07/2014 1745   HGBUR NEGATIVE 04/29/2018 1335   BILIRUBINUR negative 08/29/2021 1558   BILIRUBINUR Negative 01/26/2018 1303   BILIRUBINUR Negative 04/07/2014 1745   KETONESUR 20 (A) 04/29/2018 1335   PROTEINUR Negative 08/29/2021 1558   PROTEINUR NEGATIVE 04/29/2018 1335   UROBILINOGEN 0.2 08/29/2021 1558   NITRITE negative 08/29/2021 1558   NITRITE NEGATIVE 04/29/2018 1335   LEUKOCYTESUR Negative 08/29/2021 1558   LEUKOCYTESUR Negative 01/26/2018 1303   LEUKOCYTESUR Negative 04/07/2014 1745    Pertinent Imaging:   Assessment & Plan: Patient has nighttime frequency.  Multiple causes have been discussed in the past.  I agreed that it was okay to stop his finasteride and Flomax.  I gave Myrbetriq 50 mg samples and prescription and we will see if we can help him with overactive  bladder medications and/or percutaneous tibial nerve stimulation.  Reassess 6-7  weeks  1. Nocturia  - Urinalysis, Complete   No follow-ups on file.  Reece Packer, MD  [facility] [address] [place],  [address] [phone]

## 2022-06-12 ENCOUNTER — Telehealth: Payer: Self-pay | Admitting: Family Medicine

## 2022-06-12 MED ORDER — OMEPRAZOLE 20 MG PO CPDR: 20.0000 mg | DELAYED_RELEASE_CAPSULE | Freq: Every day | ORAL | 0 refills | Status: DC

## 2022-06-12 NOTE — Telephone Encounter (Signed)
[facility] pharmacy faxed refill request for the following medications:   omeprazole (PRILOSEC) 20 MG capsule    Please advise

## 2022-07-22 ENCOUNTER — Ambulatory Visit: Payer: PPO | Admitting: Urology

## 2022-07-22 ENCOUNTER — Encounter: Payer: Self-pay | Admitting: Urology

## 2022-07-22 VITALS — BP 158/94 | HR 76 | Ht 74.0 in | Wt 165.0 lb

## 2022-07-22 DIAGNOSIS — R351 Nocturia: Secondary | ICD-10-CM | POA: Diagnosis not present

## 2022-07-22 LAB — URINALYSIS, COMPLETE
Bilirubin, UA: NEGATIVE
Glucose, UA: NEGATIVE
Ketones, UA: NEGATIVE
Leukocytes,UA: NEGATIVE
Nitrite, UA: NEGATIVE
Protein,UA: NEGATIVE
RBC, UA: NEGATIVE
Specific Gravity, UA: 1.01 (ref 1.005–1.030)
Urobilinogen, Ur: 0.2 mg/dL (ref 0.2–1.0)
pH, UA: 7 (ref 5.0–7.5)

## 2022-07-22 LAB — MICROSCOPIC EXAMINATION: Bacteria, UA: NONE SEEN

## 2022-07-22 MED ORDER — GEMTESA 75 MG PO TABS: 1.0000 | ORAL_TABLET | Freq: Every day | ORAL | 11 refills | Status: DC

## 2022-07-22 NOTE — Progress Notes (Signed)
07/22/2022 9:05 AM   [name] 26-Jul-1940 [id number]  Referring provider: Eulas Post, MD [place],  [place] [id number]  Chief Complaint  Patient presents with   Follow-up    6 week follow-up    HPI: I was consulted to assess the patient's acute onset of nighttime frequency every 2 hours.  It is now every 3 hours.  He stopped gabapentin which had been added and he had leg edema.  Right leg is normalized.  Left leg much improved.  He is still using TED hose.  Flow is reasonable.  Frequency every 2 hours during the day.   Patient nighttime frequency likely from fluid mobilization.  Role of alpha-blocker versus watchful waiting described.  Patient has an ill-defined right middle discomfort that I did not think was from the genitourinary system.  It comes and goes and is been present for a few weeks.  We did not x-ray him today.  He would like to try Flomax.  Reassess in 6 weeks.  If he normalizes we might want to try him off it in the future.   Flow is much better on the medication but still gets up every 2 hours to void.  No infections or blood   Flow was good.  Mild frequency and nocturia stable.    Frequency stable.  Still gets about every 2 hours at night.  Sometimes flow is weak.  Not interested in surgery patient on Flomax 0.8 mg   Still goes up every 90 minutes to 2 hours at night.  He had a embolization procedure on his prostate 3 weeks ago in [place] but it did not help.    Patient has nighttime frequency.  Multiple causes have been discussed in the past.  I agreed that it was okay to stop his finasteride and Flomax.  I gave Myrbetriq 50 mg samples and prescription and we will see if we can help him with overactive bladder medications and/or percutaneous tibial nerve stimulation.  Reassess 6-7 weeks  TOday  Voids every 2-2 and half hours at night.  Flow is reasonable.  Still on finasteride.  No infections.   PMH: Past Medical History:  Diagnosis Date    AB (asthmatic bronchitis)    Abnormal liver enzymes    ABPA (allergic bronchopulmonary aspergillosis) (HCC)    Allergic rhinitis    Aortic atherosclerosis ([place])    Asthma    CAP (community acquired pneumonia)    Cellulitis    COPD (chronic obstructive pulmonary disease) ([facility] [place])    Coronary artery disease    a.) LHC 08/19/2016: 50% pRCA; intervention deferred opting for med. mgmt.   Diastolic dysfunction    a.) TTE 08/18/2016: EF 50%; inf HK; mild BAE, mild RV enlargement; G1DD   Diverticulitis    DVT (deep venous thrombosis) ([facility]) 08/20/2016   a.) Doppler 08/20/2016: chronic appearing; occlusive DVT LEFT popliteal vein, with additional nearly occlusive DVT in the overlying distal SFV.   Dyspnea    GERD (gastroesophageal reflux disease)    Glaucoma    Headache    History of sepsis    HLD (hyperlipidemia)    Hx MRSA infection    Hx of basal cell carcinoma 02/26/2018   L lat mid back   Hypertension    Resolved - no meds   Insomnia    a.) takes malatonin   NSTEMI (non-ST elevated myocardial infarction) ([facility]) 08/18/2016   a.) LHC 08/19/2016: 50% pRCA; intervention deferred opting for med. mgmt.  Osteoarthritis    Paroxysmal tachycardia (HCC)    PVC (premature ventricular contraction)     Surgical History: Past Surgical History:  Procedure Laterality Date   APPENDECTOMY     BRONCHOSCOPY     CATARACT EXTRACTION W/PHACO Left 09/01/2019   Procedure: CATARACT EXTRACTION PHACO AND INTRAOCULAR LENS PLACEMENT (IOC) LEFT 11.72 01:12.2 16.2%;  Surgeon: Leandrew Koyanagi, MD;  Location: [facility];  Service: Ophthalmology;  Laterality: Left;   CATARACT EXTRACTION W/PHACO Right 09/22/2019   Procedure: CATARACT EXTRACTION PHACO AND INTRAOCULAR LENS PLACEMENT ([place]) RIGHT;  Surgeon: Leandrew Koyanagi, MD;  Location: [facility];  Service: Ophthalmology;  Laterality: Right;  6.88 103.4 10.9%   GANGLION CYST EXCISION Left 10/19/2021   Procedure: REMOVAL GANGLION OF WRIST;   Surgeon: Earnestine Leys, MD;  Location: [facility];  Service: Orthopedics;  Laterality: Left;   LEFT HEART CATH AND CORONARY ANGIOGRAPHY N/A 08/19/2016   Procedure: Left Heart Cath and Coronary Angiography;  Surgeon: Dionisio David, MD;  Location: [facility];  Service: Cardiovascular;  Laterality: N/A;   NASAL SINUS SURGERY  09/27/2014    Home Medications:  Allergies as of 07/22/2022       Reactions   Timolol Maleate [timolol Maleate] Shortness Of Breath   Atorvastatin Swelling   Swelling in legs, dizziness   Bactrim [sulfamethoxazole-trimethoprim]    Pt says "it takes his skin pigment off   Lisinopril Other (See Comments)   cough   Symbicort [budesonide-formoterol Fumarate] Other (See Comments)   SYMBICORT Patient reports dizziness and trembling/shaky feeling.        Medication List        Accurate as of July 22, 2022  9:05 AM. If you have any questions, ask your nurse or doctor.          STOP taking these medications    Albuterol Sulfate (sensor) 108 (90 Base) MCG/ACT Aepb Stopped by: Reece Packer, MD   phenazopyridine 200 MG tablet Commonly known as: PYRIDIUM Stopped by: Reece Packer, MD       TAKE these medications    albuterol 108 (90 Base) MCG/ACT inhaler Commonly known as: VENTOLIN HFA Inhale 2 puffs into the lungs every 6 (six) hours as needed for wheezing or shortness of breath.   albuterol (2.5 MG/3ML) 0.083% nebulizer solution Commonly known as: PROVENTIL Take 2.5 mg by nebulization in the morning and at bedtime.   azithromycin 250 MG tablet Commonly known as: ZITHROMAX Take 250 mg by mouth daily.   brimonidine 0.15 % ophthalmic solution Commonly known as: ALPHAGAN Place 1 drop into both eyes 2 (two) times daily.   camphor-menthol lotion Commonly known as: SARNA Apply 1 application. topically as needed for itching.   Desoximetasone 0.25 % ointment Commonly known as: TOPICORT APPLY TO ECZEMA OF UPPER ARM TWICE DAILYAS  DIRECTED.   diltiazem 180 MG 24 hr capsule Commonly known as: CARDIZEM CD TAKE 1 CAPSULE BY MOUTH ONCE DAILY   dorzolamide 2 % ophthalmic solution Commonly known as: TRUSOPT Place 1 drop into both eyes 2 (two) times daily.   finasteride 5 MG tablet Commonly known as: PROSCAR Take 1 tablet (5 mg total) by mouth daily.   fluticasone 50 MCG/ACT nasal spray Commonly known as: FLONASE   ketoconazole 2 % shampoo Commonly known as: NIZORAL   latanoprost 0.005 % ophthalmic solution Commonly known as: XALATAN Place 1 drop into both eyes at bedtime.   melatonin 5 MG Tabs Take 5 mg by mouth at bedtime.   mirabegron ER  50 MG Tb24 tablet Commonly known as: Myrbetriq Take 1 tablet (50 mg total) by mouth daily.   mometasone 0.1 % cream Commonly known as: ELOCON   montelukast 10 MG tablet Commonly known as: SINGULAIR Take 10 mg by mouth at bedtime.   omeprazole 20 MG capsule Commonly known as: PRILOSEC Take 1 capsule (20 mg total) by mouth daily. Take 20 mg by mouth daily. Please schedule office visit before any future refill.   Sodium Chloride (Inhalant) 7 % Nebu Take 1 vial by nebulization 2 (two) times daily.   tiotropium 18 MCG inhalation capsule Commonly known as: SPIRIVA Place 18 mcg into inhaler and inhale every evening.   WIXELA INHUB IN Inhale into the lungs.   Xarelto 10 MG Tabs tablet Generic drug: rivaroxaban Take 1 tablet (10 mg total) by mouth daily.        Allergies:  Allergies  Allergen Reactions   Timolol Maleate [Timolol Maleate] Shortness Of Breath   Atorvastatin Swelling    Swelling in legs, dizziness   Bactrim [Sulfamethoxazole-Trimethoprim]     Pt says "it takes his skin pigment off   Lisinopril Other (See Comments)    cough   Symbicort [Budesonide-Formoterol Fumarate] Other (See Comments)    SYMBICORT Patient reports dizziness and trembling/shaky feeling.    Family History: Family History  Problem Relation Age of Onset   Breast  cancer Sister    Heart failure Mother    Pancreatic cancer Sister    Alzheimer's disease Father    Brain cancer Daughter     Social History:  reports that he quit smoking about 64 years ago. His smoking use included cigarettes. He has a 1.50 pack-year smoking history. He has never used smokeless tobacco. He reports current alcohol use of about 3.0 - 4.0 standard drinks of alcohol per week. He reports that he does not use drugs.  ROS:                                        Physical Exam: BP (!) 158/94   Pulse 76   Ht '6\' 2"'$  (1.88 m)   Wt 74.8 kg   BMI 21.18 kg/m   Constitutional:  Alert and oriented, No acute distress. HEENT: [place] AT, moist mucus membranes.  Trachea midline, no masses.   Laboratory Data: Lab Results  Component Value Date   WBC 8.3 03/12/2022   HGB 15.7 03/12/2022   HCT 45.5 03/12/2022   MCV 95 03/12/2022   PLT 227 03/12/2022    Lab Results  Component Value Date   CREATININE 1.09 03/12/2022    Lab Results  Component Value Date   PSA 3.6 11/17/2013    No results found for: "TESTOSTERONE"  Lab Results  Component Value Date   HGBA1C 5.4 08/18/2016    Urinalysis    Component Value Date/Time   COLORURINE YELLOW (A) 04/29/2018 1335   APPEARANCEUR CLEAR (A) 04/29/2018 1335   APPEARANCEUR Clear 01/26/2018 1303   LABSPEC 1.014 04/29/2018 1335   LABSPEC 1.012 04/07/2014 1745   PHURINE 7.0 04/29/2018 1335   GLUCOSEU NEGATIVE 04/29/2018 1335   GLUCOSEU Negative 04/07/2014 1745   HGBUR NEGATIVE 04/29/2018 1335   BILIRUBINUR negative 08/29/2021 1558   BILIRUBINUR Negative 01/26/2018 1303   BILIRUBINUR Negative 04/07/2014 1745   KETONESUR 20 (A) 04/29/2018 1335   PROTEINUR Negative 08/29/2021 1558   PROTEINUR NEGATIVE 04/29/2018 1335   UROBILINOGEN 0.2 08/29/2021 1558  NITRITE negative 08/29/2021 1558   NITRITE NEGATIVE 04/29/2018 1335   LEUKOCYTESUR Negative 08/29/2021 1558   LEUKOCYTESUR Negative 01/26/2018 1303    LEUKOCYTESUR Negative 04/07/2014 1745    Pertinent Imaging:   Assessment & Plan: Stop Myrbetriq.  See in 6 or 7 weeks on Gemtesa.  Oxybutynin percutaneous tibial nerve stimulation or options.  1. Nocturia  - Urinalysis, Complete   No follow-ups on file.  Reece Packer, MD  [facility] [address], [place], [place] [address] [phone]

## 2022-08-07 DIAGNOSIS — J4550 Severe persistent asthma, uncomplicated: Secondary | ICD-10-CM | POA: Diagnosis not present

## 2022-08-07 DIAGNOSIS — J471 Bronchiectasis with (acute) exacerbation: Secondary | ICD-10-CM | POA: Diagnosis not present

## 2022-08-07 DIAGNOSIS — R768 Other specified abnormal immunological findings in serum: Secondary | ICD-10-CM | POA: Diagnosis not present

## 2022-08-07 DIAGNOSIS — J45909 Unspecified asthma, uncomplicated: Secondary | ICD-10-CM | POA: Diagnosis not present

## 2022-08-07 DIAGNOSIS — B4481 Allergic bronchopulmonary aspergillosis: Secondary | ICD-10-CM | POA: Diagnosis not present

## 2022-08-07 DIAGNOSIS — R0989 Other specified symptoms and signs involving the circulatory and respiratory systems: Secondary | ICD-10-CM | POA: Diagnosis not present

## 2022-09-04 ENCOUNTER — Encounter: Payer: Self-pay | Admitting: Podiatry

## 2022-09-04 ENCOUNTER — Ambulatory Visit: Payer: PPO | Admitting: Podiatry

## 2022-09-04 DIAGNOSIS — M778 Other enthesopathies, not elsewhere classified: Secondary | ICD-10-CM | POA: Diagnosis not present

## 2022-09-04 MED ORDER — TRIAMCINOLONE ACETONIDE 40 MG/ML IJ SUSP
20.0000 mg | Freq: Once | INTRAMUSCULAR | Status: AC
  Administered 2022-09-04: 20 mg

## 2022-09-04 NOTE — Progress Notes (Signed)
He presents today for follow-up of his capsulitis to the fourth intermetatarsal space of the right foot.  States that after the last injection he was doing really well states that the majority of his pain is now located right here as he points to the proximal fourth intermetatarsal space.  Objective: Vital signs stable he is alert oriented x 3 pulses are palpable.  There is no erythema edema cellulitis drainage or odor he has pain on palpation to the site.  Assessment: Deep intermetatarsal ligament sprain or osteoarthritic changes in that tarsometatarsal joint.  Plan: Discussed etiology pathology and surgical therapies at this point performed a injection 10 mg Kenalog 5 mg Marcaine point maximal tenderness.  Tolerated procedure well without complications we will follow-up with him in 4 months.

## 2022-09-09 ENCOUNTER — Ambulatory Visit: Payer: PPO | Admitting: Urology

## 2022-09-09 VITALS — BP 155/81 | Ht 74.0 in | Wt 165.0 lb

## 2022-09-09 DIAGNOSIS — R351 Nocturia: Secondary | ICD-10-CM | POA: Diagnosis not present

## 2022-09-09 NOTE — Progress Notes (Signed)
09/09/2022 8:29 AM   [name] 05-01-1941 [id number]  Referring provider: Virginia Crews, [place],   [id number]  Chief Complaint  Patient presents with   Follow-up    nocturia    HPI: I was consulted to assess the patient's acute onset of nighttime frequency every 2 hours.  It is now every 3 hours.  He stopped gabapentin which had been added and he had leg edema.  Right leg is normalized.  Left leg much improved.  He is still using TED hose.  Flow is reasonable.  Frequency every 2 hours during the day.   Patient nighttime frequency likely from fluid mobilization.  Role of alpha-blocker versus watchful waiting described.  Patient has an ill-defined right middle discomfort that I did not think was from the genitourinary system.  It comes and goes and is been present for a few weeks.  We did not x-ray him today.  He would like to try Flomax.  Reassess in 6 weeks.  If he normalizes we might want to try him off it in the future.   Flow is much better on the medication but still gets up every 2 hours to void.  No infections or blood   Flow was good.  Mild frequency and nocturia stable.     Frequency stable.  Still gets about every 2 hours at night.  Sometimes flow is weak.  Not interested in surgery patient on Flomax 0.8 mg   Still goes up every 90 minutes to 2 hours at night.  He had a embolization procedure on his prostate 3 weeks ago in [place] but it did not help.     Patient has nighttime frequency.  Multiple causes have been discussed in the past.  I agreed that it was okay to stop his finasteride and Flomax.  I gave Myrbetriq 50 mg samples and prescription and we will see if we can help him with overactive bladder medications and/or percutaneous tibial nerve stimulation.  Reassess 6-7 weeks    Voids every 2 to 2 and half hours at night.  Flow is reasonable.  Still on finasteride.  No infections.    Stop Myrbetriq. See in 6 or 7 weeks on  Gemtesa. Oxybutynin percutaneous tibial nerve stimulation or options.   Today Voiding every 2 and half hours which is an improvement in his nocturia.  He like to stay on the [place].  Frequency stable.  No infections.  I reviewed oxybutynin and percutaneous tibial nerve stimulation and gave him a handout.  PMH: Past Medical History:  Diagnosis Date   AB (asthmatic bronchitis)    Abnormal liver enzymes    ABPA (allergic bronchopulmonary aspergillosis) (HCC)    Allergic rhinitis    Aortic atherosclerosis ([place])    Asthma    CAP (community acquired pneumonia)    Cellulitis    COPD (chronic obstructive pulmonary disease) ([facility])    Coronary artery disease    a.) LHC 08/19/2016: 50% pRCA; intervention deferred opting for med. mgmt.   Diastolic dysfunction    a.) TTE 08/18/2016: EF 50%; inf HK; mild BAE, mild RV enlargement; G1DD   Diverticulitis    DVT (deep venous thrombosis) ([facility]) 08/20/2016   a.) Doppler 08/20/2016: chronic appearing; occlusive DVT LEFT popliteal vein, with additional nearly occlusive DVT in the overlying distal SFV.   Dyspnea    GERD (gastroesophageal reflux disease)    Glaucoma    Headache    History of sepsis  HLD (hyperlipidemia)    Hx MRSA infection    Hx of basal cell carcinoma 02/26/2018   L lat mid back   Hypertension    Resolved - no meds   Insomnia    a.) takes malatonin   NSTEMI (non-ST elevated myocardial infarction) ([facility]) 08/18/2016   a.) LHC 08/19/2016: 50% pRCA; intervention deferred opting for med. mgmt.   Osteoarthritis    Paroxysmal tachycardia (HCC)    PVC (premature ventricular contraction)     Surgical History: Past Surgical History:  Procedure Laterality Date   APPENDECTOMY     BRONCHOSCOPY     CATARACT EXTRACTION W/PHACO Left 09/01/2019   Procedure: CATARACT EXTRACTION PHACO AND INTRAOCULAR LENS PLACEMENT (IOC) LEFT 11.72 01:12.2 16.2%;  Surgeon: Leandrew Koyanagi, MD;  Location: [facility];  Service: Ophthalmology;   Laterality: Left;   CATARACT EXTRACTION W/PHACO Right 09/22/2019   Procedure: CATARACT EXTRACTION PHACO AND INTRAOCULAR LENS PLACEMENT ([facility]) RIGHT;  Surgeon: Leandrew Koyanagi, MD;  Location: [facility];  Service: Ophthalmology;  Laterality: Right;  6.88 103.4 10.9%   GANGLION CYST EXCISION Left 10/19/2021   Procedure: REMOVAL GANGLION OF WRIST;  Surgeon: Earnestine Leys, MD;  Location: [facility];  Service: Orthopedics;  Laterality: Left;   LEFT HEART CATH AND CORONARY ANGIOGRAPHY N/A 08/19/2016   Procedure: Left Heart Cath and Coronary Angiography;  Surgeon: Dionisio David, MD;  Location: [facility];  Service: Cardiovascular;  Laterality: N/A;   NASAL SINUS SURGERY  09/27/2014    Home Medications:  Allergies as of 09/09/2022       Reactions   Timolol Maleate [timolol Maleate] Shortness Of Breath   Atorvastatin Swelling   Swelling in legs, dizziness   Bactrim [sulfamethoxazole-trimethoprim]    Pt says "it takes his skin pigment off   Lisinopril Other (See Comments)   cough   Symbicort [budesonide-formoterol Fumarate] Other (See Comments)   SYMBICORT Patient reports dizziness and trembling/shaky feeling.        Medication List        Accurate as of September 09, 2022  8:29 AM. If you have any questions, ask your nurse or doctor.          STOP taking these medications    fluticasone 50 MCG/ACT nasal spray Commonly known as: FLONASE       TAKE these medications    albuterol 108 (90 Base) MCG/ACT inhaler Commonly known as: VENTOLIN HFA Inhale 2 puffs into the lungs every 6 (six) hours as needed for wheezing or shortness of breath.   albuterol (2.5 MG/3ML) 0.083% nebulizer solution Commonly known as: PROVENTIL Take 2.5 mg by nebulization in the morning and at bedtime.   azithromycin 250 MG tablet Commonly known as: ZITHROMAX Take 250 mg by mouth daily.   brimonidine 0.15 % ophthalmic solution Commonly known as: ALPHAGAN Place 1 drop into both eyes 2  (two) times daily.   camphor-menthol lotion Commonly known as: SARNA Apply 1 application. topically as needed for itching.   Desoximetasone 0.25 % ointment Commonly known as: TOPICORT APPLY TO ECZEMA OF UPPER ARM TWICE DAILYAS DIRECTED.   diltiazem 180 MG 24 hr capsule Commonly known as: CARDIZEM CD TAKE 1 CAPSULE BY MOUTH ONCE DAILY   dorzolamide 2 % ophthalmic solution Commonly known as: TRUSOPT Place 1 drop into both eyes 2 (two) times daily.   finasteride 5 MG tablet Commonly known as: PROSCAR Take 1 tablet (5 mg total) by mouth daily.   Gemtesa 75 MG Tabs Generic drug: Vibegron Take 1 tablet (  75 mg total) by mouth daily.   ketoconazole 2 % shampoo Commonly known as: NIZORAL   latanoprost 0.005 % ophthalmic solution Commonly known as: XALATAN Place 1 drop into both eyes at bedtime.   melatonin 5 MG Tabs Take 5 mg by mouth at bedtime.   mometasone 0.1 % cream Commonly known as: ELOCON   montelukast 10 MG tablet Commonly known as: SINGULAIR Take 10 mg by mouth at bedtime.   omeprazole 20 MG capsule Commonly known as: PRILOSEC Take 1 capsule (20 mg total) by mouth daily. Take 20 mg by mouth daily. Please schedule office visit before any future refill.   Sodium Chloride (Inhalant) 7 % Nebu Take 1 vial by nebulization 2 (two) times daily.   tiotropium 18 MCG inhalation capsule Commonly known as: SPIRIVA Place 18 mcg into inhaler and inhale every evening.   WIXELA INHUB IN Inhale into the lungs.   Xarelto 10 MG Tabs tablet Generic drug: rivaroxaban Take 1 tablet (10 mg total) by mouth daily.        Allergies:  Allergies  Allergen Reactions   Timolol Maleate [Timolol Maleate] Shortness Of Breath   Atorvastatin Swelling    Swelling in legs, dizziness   Bactrim [Sulfamethoxazole-Trimethoprim]     Pt says "it takes his skin pigment off   Lisinopril Other (See Comments)    cough   Symbicort [Budesonide-Formoterol Fumarate] Other (See Comments)     SYMBICORT Patient reports dizziness and trembling/shaky feeling.    Family History: Family History  Problem Relation Age of Onset   Breast cancer Sister    Heart failure Mother    Pancreatic cancer Sister    Alzheimer's disease Father    Brain cancer Daughter     Social History:  reports that he quit smoking about 64 years ago. His smoking use included cigarettes. He has a 1.50 pack-year smoking history. He has never used smokeless tobacco. He reports current alcohol use of about 3.0 - 4.0 standard drinks of alcohol per week. He reports that he does not use drugs.  ROS:                                        Physical Exam: BP (!) 155/81   Ht 6\' 2"  (1.88 m)   Wt 74.8 kg   BMI 21.18 kg/m   Constitutional:  Alert and oriented, No acute distress. HEENT: [place] AT, moist mucus membranes.  Trachea midline, no masses.   Laboratory Data: Lab Results  Component Value Date   WBC 8.3 03/12/2022   HGB 15.7 03/12/2022   HCT 45.5 03/12/2022   MCV 95 03/12/2022   PLT 227 03/12/2022    Lab Results  Component Value Date   CREATININE 1.09 03/12/2022    Lab Results  Component Value Date   PSA 3.6 11/17/2013    No results found for: "TESTOSTERONE"  Lab Results  Component Value Date   HGBA1C 5.4 08/18/2016    Urinalysis    Component Value Date/Time   COLORURINE YELLOW (A) 04/29/2018 1335   APPEARANCEUR Clear 07/22/2022 0856   LABSPEC 1.014 04/29/2018 1335   LABSPEC 1.012 04/07/2014 1745   PHURINE 7.0 04/29/2018 1335   GLUCOSEU Negative 07/22/2022 0856   GLUCOSEU Negative 04/07/2014 1745   HGBUR NEGATIVE 04/29/2018 1335   BILIRUBINUR Negative 07/22/2022 0856   BILIRUBINUR Negative 04/07/2014 1745   KETONESUR 20 (A) 04/29/2018 1335   PROTEINUR Negative  07/22/2022 [place] 04/29/2018 1335   UROBILINOGEN 0.2 08/29/2021 1558   NITRITE Negative 07/22/2022 0856   NITRITE NEGATIVE 04/29/2018 1335   LEUKOCYTESUR Negative 07/22/2022 0856    LEUKOCYTESUR Negative 04/07/2014 1745    Pertinent Imaging:   Assessment & Plan: I gave him more Gemtesa samples while he is trying to sort out his modest response.  Reassess in 7 weeks.  Handout given.  Proceed accordingly  1. Nocturia  - Urinalysis, Complete   No follow-ups on file.  Reece Packer, MD  [facility] [address], [place], [place] [address] [phone]

## 2022-09-09 NOTE — Patient Instructions (Signed)

## 2022-09-17 DIAGNOSIS — H401122 Primary open-angle glaucoma, left eye, moderate stage: Secondary | ICD-10-CM | POA: Diagnosis not present

## 2022-09-17 DIAGNOSIS — H401113 Primary open-angle glaucoma, right eye, severe stage: Secondary | ICD-10-CM | POA: Diagnosis not present

## 2022-09-17 DIAGNOSIS — Z961 Presence of intraocular lens: Secondary | ICD-10-CM | POA: Diagnosis not present

## 2022-09-17 DIAGNOSIS — H353131 Nonexudative age-related macular degeneration, bilateral, early dry stage: Secondary | ICD-10-CM | POA: Diagnosis not present

## 2022-09-26 ENCOUNTER — Other Ambulatory Visit: Payer: Self-pay

## 2022-09-26 DIAGNOSIS — J4489 Other specified chronic obstructive pulmonary disease: Secondary | ICD-10-CM

## 2022-09-26 DIAGNOSIS — I10 Essential (primary) hypertension: Secondary | ICD-10-CM

## 2022-09-30 DIAGNOSIS — I8290 Acute embolism and thrombosis of unspecified vein: Secondary | ICD-10-CM | POA: Diagnosis not present

## 2022-10-01 ENCOUNTER — Telehealth: Payer: Self-pay

## 2022-10-01 NOTE — Progress Notes (Signed)
Care Management & Coordination Services Pharmacy Team Pharmacy Assistant   Name: [name]  MRN: [id number] DOB: [birth year]  Reason for Encounter: Appointment Reminder  Contacted patient to confirm in office appointment with Angelena Sole, PharmD on 04/19 at 0900. Unsuccessful outreach. Left voicemail for patient to return call. Patient did call back and LVM for me to return his call. I did return his call but had to leave him another message.  Conditions to be addressed/monitored: HTN, COPD, Asthma, Allergic Rhinitis, and Asymptomatic PVCs, Paroxysmal Tachycardia, Disorder of Mitral Valve, Beat, Premature Ventricular, Hypotension, Coronary Artery Disease Involving Native Coronary Artery of Native Heart, Ventricular Premature Beats, CAP, Recurrent PNA, HCAP, Community Acquired PNA, Basal Cell Carcinoma, Basal Cell Carcinoma of skin, Benign Prostatic Hyperplasia w/o urinary obstruction, BPH associated with nocturia,   Chart review:  Recent office visits:  None ID  Recent consult visits:  09/09/2022 Alfredo Martinez, MD (Urology) for Follow-up- Stopped: Fluticasone Propionate 50 mcg, No orders placed, BP: 155/81, Patient to follow-up in 7 weeks  09/04/2022 Max Al Corpus, DPM (Podiatry) for Foot pain- No medication changes noted, No orders placed, Patient to follow-up in 4 months  08/07/2022 (Pulmonary) unable to view note  07/22/2022 Alfredo Martinez, MD (Urology) for Follow-up- Stopped: Albuterol Sulfate, Mirabegron, Phenazopyridine, Lab orders placed, BP: 158/94, No follow-up noted  06/03/2022 Alfredo Martinez, MD (Urology) for Nocturia- Stopped: Azithromycin 250 mg, Famotidine 20 mg, Tamsulosin, No orders placed,   05/06/2022 Max Al Corpus, DPM (Podiatry) for Foot Pain- No medication changes noted, No orders placed, Patient to follow-up in 4 months  Hospital visits:  None in previous 6 months  Medications: Outpatient Encounter Medications as of 10/01/2022  Medication Sig   albuterol  (PROVENTIL HFA;VENTOLIN HFA) 108 (90 Base) MCG/ACT inhaler Inhale 2 puffs into the lungs every 6 (six) hours as needed for wheezing or shortness of breath.   albuterol (PROVENTIL) (2.5 MG/3ML) 0.083% nebulizer solution Take 2.5 mg by nebulization in the morning and at bedtime.   azithromycin (ZITHROMAX) 250 MG tablet Take 250 mg by mouth daily.   brimonidine (ALPHAGAN) 0.15 % ophthalmic solution Place 1 drop into both eyes 2 (two) times daily.   camphor-menthol (SARNA) lotion Apply 1 application. topically as needed for itching.   Desoximetasone (TOPICORT) 0.25 % ointment APPLY TO ECZEMA OF UPPER ARM TWICE DAILYAS DIRECTED.   diltiazem (CARDIZEM CD) 180 MG 24 hr capsule TAKE 1 CAPSULE BY MOUTH ONCE DAILY   dorzolamide (TRUSOPT) 2 % ophthalmic solution Place 1 drop into both eyes 2 (two) times daily.    finasteride (PROSCAR) 5 MG tablet Take 1 tablet (5 mg total) by mouth daily.   Fluticasone-Salmeterol (WIXELA INHUB IN) Inhale into the lungs.   ketoconazole (NIZORAL) 2 % shampoo    latanoprost (XALATAN) 0.005 % ophthalmic solution Place 1 drop into both eyes at bedtime.    Melatonin 5 MG TABS Take 5 mg by mouth at bedtime.   mometasone (ELOCON) 0.1 % cream    montelukast (SINGULAIR) 10 MG tablet Take 10 mg by mouth at bedtime.   omeprazole (PRILOSEC) 20 MG capsule Take 1 capsule (20 mg total) by mouth daily. Take 20 mg by mouth daily. Please schedule office visit before any future refill.   Sodium Chloride, Inhalant, 7 % NEBU Take 1 vial by nebulization 2 (two) times daily.   tiotropium (SPIRIVA) 18 MCG inhalation capsule Place 18 mcg into inhaler and inhale every evening.   Vibegron (GEMTESA) 75 MG TABS Take 1 tablet (75 mg total) by mouth daily.  XARELTO 10 MG TABS tablet Take 1 tablet (10 mg total) by mouth daily.   No facility-administered encounter medications on file as of 10/01/2022.   Star Rating Drugs:  None ID   Care Gaps: Annual wellness visit in last year? Yes   Adelene Idler, CPA/CMA Clinical Pharmacist Assistant Phone: [phone]

## 2022-10-04 NOTE — Progress Notes (Unsigned)
Care Management & Coordination Services Pharmacy Note  10/04/2022 Name:  [name] MRN:  [id number] DOB:  1940-06-21  Summary: ***  Recommendations/Changes made from today's visit: ***  Follow up plan: ***   Subjective: [name] is an 82 y.o. year old male who is a primary patient of Bacigalupo, Marzella Schlein, MD.  The care coordination team was consulted for assistance with disease management and care coordination needs.    {CCMTELEPHONEFACETOFACE:21091510} for {CCMINITIALFOLLOWUPCHOICE:21091511}.  Recent office visits: ***  Recent consult visits: 09/09/2022 Alfredo Martinez, MD (Urology) for Follow-up- Stopped: Fluticasone Propionate 50 mcg, No orders placed, BP: 155/81, Patient to follow-up in 7 weeks   09/04/2022 Ernestene Kiel, DPM (Podiatry) for Foot pain- No medication changes noted, No orders placed, Patient to follow-up in 4 months   08/07/2022 (Pulmonary) unable to view note   07/22/2022 Alfredo Martinez, MD (Urology) for Follow-up- Stopped: Albuterol Sulfate, Mirabegron, Phenazopyridine, Lab orders placed, BP: 158/94, No follow-up noted   06/03/2022 Alfredo Martinez, MD (Urology) for Nocturia- Stopped: Azithromycin 250 mg, Famotidine 20 mg, Tamsulosin, No orders placed,    05/06/2022 Max Hyatt, DPM (Podiatry) for Foot Pain- No medication changes noted, No orders placed, Patient to follow-up in 4 months  Hospital visits: {Hospital DC Yes/No:25215}   Objective:  Lab Results  Component Value Date   CREATININE 1.09 03/12/2022   BUN 13 03/12/2022   EGFR 67 03/12/2022   GFRNONAA 64 12/28/2019   GFRAA 74 12/28/2019   NA 137 03/12/2022   K 4.1 03/12/2022   CALCIUM 9.2 03/12/2022   CO2 22 03/12/2022   GLUCOSE 94 03/12/2022    Lab Results  Component Value Date/Time   HGBA1C 5.4 08/18/2016 02:32 PM    Last diabetic Eye exam: No results found for: "HMDIABEYEEXA"  Last diabetic Foot exam: No results found for: "HMDIABFOOTEX"   Lab Results  Component Value  Date   CHOL 138 12/28/2019   HDL 52 12/28/2019   LDLCALC 76 12/28/2019   TRIG 45 12/28/2019   CHOLHDL 1.8 12/08/2018       Latest Ref Rng & Units 03/12/2022    4:26 PM 08/29/2021    1:57 PM 07/18/2021    9:31 AM  Hepatic Function  Total Protein 6.0 - 8.5 g/dL 6.5  5.6  6.1   Albumin 3.7 - 4.7 g/dL 4.4  3.4  4.2   AST 0 - 40 IU/L 27  16  26    ALT 0 - 44 IU/L 22  21  26    Alk Phosphatase 44 - 121 IU/L 136  90  106   Total Bilirubin 0.0 - 1.2 mg/dL 0.5  1.1  0.7     Lab Results  Component Value Date/Time   TSH 1.470 03/12/2022 04:26 PM   TSH 1.960 01/02/2021 11:08 AM       Latest Ref Rng & Units 03/12/2022    4:26 PM 08/29/2021    1:57 PM 01/02/2021   11:08 AM  CBC  WBC 3.4 - 10.8 x10E3/uL 8.3  12.9  7.4   Hemoglobin 13.0 - 17.7 g/dL 40.9  81.1  91.4   Hematocrit 37.5 - 51.0 % 45.5  44.3  45.3   Platelets 150 - 450 x10E3/uL 227  199  198     Lab Results  Component Value Date/Time   VITAMINB12 460 12/05/2015 11:30 AM    Clinical ASCVD: {YES/NO:21197} The ASCVD Risk score (Arnett DK, et al., 2019) failed to calculate for the following reasons:   The 2019 ASCVD risk score  is only valid for ages 61 to 30    ***Other: (CHADS2VASc if Afib, MMRC or CAT for COPD, ACT, DEXA)     03/06/2022    2:03 PM 11/07/2021    3:40 PM 07/18/2021    9:10 AM  Depression screen PHQ 2/9  Decreased Interest 0 0 0  Down, Depressed, Hopeless 0 0 0  PHQ - 2 Score 0 0 0  Altered sleeping 0  0  Tired, decreased energy 0  0  Change in appetite 0  0  Feeling bad or failure about yourself  0  0  Trouble concentrating 0  0  Moving slowly or fidgety/restless 0  0  Suicidal thoughts 0  0  PHQ-9 Score 0  0  Difficult doing work/chores Not difficult at all  Not difficult at all     Social History   Tobacco Use  Smoking Status Former   Packs/day: 0.50   Years: 3.00   Additional pack years: 0.00   Total pack years: 1.50   Types: Cigarettes   Quit date: 06/17/1958   Years since quitting: 64.3   Smokeless Tobacco Never  Tobacco Comments   smoking while in college   BP Readings from Last 3 Encounters:  09/09/22 (!) 155/81  07/22/22 (!) 158/94  06/03/22 121/70   Pulse Readings from Last 3 Encounters:  07/22/22 76  06/03/22 84  03/12/22 77   Wt Readings from Last 3 Encounters:  09/09/22 165 lb (74.8 kg)  07/22/22 165 lb (74.8 kg)  06/03/22 167 lb 4 oz (75.9 kg)   BMI Readings from Last 3 Encounters:  09/09/22 21.18 kg/m  07/22/22 21.18 kg/m  06/03/22 21.47 kg/m    Allergies  Allergen Reactions   Timolol Maleate [Timolol Maleate] Shortness Of Breath   Atorvastatin Swelling    Swelling in legs, dizziness   Bactrim [Sulfamethoxazole-Trimethoprim]     Pt says "it takes his skin pigment off   Lisinopril Other (See Comments)    cough   Symbicort [Budesonide-Formoterol Fumarate] Other (See Comments)    SYMBICORT Patient reports dizziness and trembling/shaky feeling.    Medications Reviewed Today     Reviewed by Randa Lynn, CMA (Certified Medical Assistant) on 09/09/22 at [phone]  Med List Status: <None>   Medication Order Taking? Sig Documenting Provider Last Dose Status Informant  albuterol (PROVENTIL HFA;VENTOLIN HFA) 108 (90 Base) MCG/ACT inhaler [id number] Yes Inhale 2 puffs into the lungs every 6 (six) hours as needed for wheezing or shortness of breath. [provider] Taking Active Self  albuterol (PROVENTIL) (2.5 MG/3ML) 0.083% nebulizer solution [id number] Yes Take 2.5 mg by nebulization in the morning and at bedtime. [provider] Taking Active Self  azithromycin (ZITHROMAX) 250 MG tablet [id number] Yes Take 250 mg by mouth daily. [provider] Taking Active   brimonidine (ALPHAGAN) 0.15 % ophthalmic solution [id number] Yes Place 1 drop into both eyes 2 (two) times daily. [provider] Taking Active Self  camphor-menthol Wynelle Fanny) lotion [id number] Yes Apply 1 application. topically as needed for itching.  [provider] Taking Active Self  Desoximetasone (TOPICORT) 0.25 % ointment [id number] Yes APPLY TO ECZEMA OF UPPER ARM TWICE DAILYAS DIRECTED. Deirdre Evener, MD Taking Active   diltiazem [facility] CD) 180 MG 24 hr capsule [id number] Yes TAKE 1 CAPSULE BY MOUTH ONCE DAILY Bosie Clos, MD Taking Active   dorzolamide (TRUSOPT) 2 % ophthalmic solution [id number] Yes Place 1 drop into both eyes 2 (two) times daily.  [provider] Taking Active  Self           Med Note Corie Chiquito   Sun Dec 31, 2015 10:48 AM)    finasteride (PROSCAR) 5 MG tablet [id number] Yes Take 1 tablet (5 mg total) by mouth daily. Caro Laroche, DO Taking Active   Fluticasone-Salmeterol [place]) [id number] Yes Inhale into the lungs. [provider] Taking Active   ketoconazole (NIZORAL) 2 % shampoo [id number] Yes  [provider] Taking Active   latanoprost (XALATAN) 0.005 % ophthalmic solution [id number] Yes Place 1 drop into both eyes at bedtime.  [provider] Taking Active Self  Melatonin 5 MG TABS [id number] Yes Take 5 mg by mouth at bedtime. [provider] Taking Active Self  mometasone (ELOCON) 0.1 % cream [id number] Yes  [provider] Taking Active   montelukast (SINGULAIR) 10 MG tablet [id number] Yes Take 10 mg by mouth at bedtime. [provider] Taking Active Self  omeprazole (PRILOSEC) 20 MG capsule [id number] Yes Take 1 capsule (20 mg total) by mouth daily. Take 20 mg by mouth daily. Please schedule office visit before any future refill. Jacky Kindle, FNP Taking Active   Sodium Chloride, Inhalant, 7 % NEBU [id number] Yes Take 1 vial by nebulization 2 (two) times daily. [provider] Taking Active Self  tiotropium (SPIRIVA) 18 MCG inhalation capsule [id number] Yes Place 18 mcg into inhaler and inhale every evening. [provider] Taking Active Self           Med Note Claudie Revering Dec 31, 2015 10:42 AM)    Vibegron (GEMTESA) 75 MG TABS [id number] Yes Take 1 tablet (75 mg total) by mouth daily. Alfredo Martinez, MD Taking Active   XARELTO 10 MG TABS tablet [id number] Yes Take 1 tablet (10 mg total) by mouth daily. Bosie Clos, MD Taking Active Self  Med List Note Marcelino Freestone, CPhT 08/13/17 1858): PT REQUEST TO TAKE HIS MORNING MEDIACTIION AT 0600 AND NIGHT MEDICATION AT 2200            SDOH:  (Social Determinants of Health) assessments and interventions performed: {yes/no:20286} SDOH Interventions    Flowsheet Row Clinical Support from 03/06/2022 in [facility] Office Visit from 03/04/2021 in [facility] Clinical Support from 12/22/2019 in [facility]  SDOH Interventions     Food Insecurity Interventions Intervention Not Indicated Intervention Not Indicated Intervention Not Indicated  Housing Interventions Intervention Not Indicated Intervention Not Indicated Intervention Not Indicated  Transportation Interventions Intervention Not Indicated Intervention Not Indicated Intervention Not Indicated  Utilities Interventions Intervention Not Indicated -- --  Alcohol Usage Interventions Intervention Not Indicated (Score <7) -- --  Financial Strain Interventions Intervention Not Indicated Intervention Not Indicated Intervention Not Indicated  Physical Activity Interventions Intervention Not Indicated Intervention Not Indicated Intervention Not Indicated  Stress Interventions Intervention Not Indicated Intervention Not Indicated Intervention Not Indicated  Social Connections Interventions Intervention Not Indicated Intervention Not Indicated Intervention Not Indicated       Medication Assistance: {MEDASSISTANCEINFO:25044}  Medication Access: Within the past 30 days, how often has patient missed a dose of medication? *** Is a pillbox or other method used to improve adherence?  {YES/NO:21197} Factors that may affect medication adherence? {CHL DESC; BARRIERS:21522} Are meds synced by current pharmacy? {YES/NO:21197} Are meds delivered by current pharmacy? {YES/NO:21197} Does patient experience delays in picking up medications due to transportation concerns? {YES/NO:21197}  [facility] Reviewed: Is patient disadvantaged to use  [facility]?: {YES/NO:21197} Current Rx insurance plan: *** Name and location of Current pharmacy:  [facility], [place] - [address] [place] [address] Phone: [phone] Fax: [phone]  [facility] - [place], [place] - [address] [place] [address] Phone: [phone] Fax: [phone]  [facility] services reviewed with patient today?: {YES/NO:21197} Patient requests to transfer care to [facility]?: {YES/NO:21197} Reason patient declined to change pharmacies: {US patient preference:27474}  Compliance/Adherence/Medication fill history: Care Gaps: ***  Star-Rating Drugs: ***   Assessment/Plan   {CCM PHARMD DISEASE STATES:25130}  Follow Up Plan: {CM FOLLOW UP PLAN:22241}    ***

## 2022-10-12 ENCOUNTER — Other Ambulatory Visit: Payer: Self-pay | Admitting: Family Medicine

## 2022-11-02 ENCOUNTER — Ambulatory Visit
Admission: EM | Admit: 2022-11-02 | Discharge: 2022-11-02 | Disposition: A | Discharge status: Home / Self Care | Payer: PPO | Attending: Emergency Medicine | Admitting: Emergency Medicine

## 2022-11-02 DIAGNOSIS — R5383 Other fatigue: Secondary | ICD-10-CM

## 2022-11-02 DIAGNOSIS — I252 Old myocardial infarction: Secondary | ICD-10-CM | POA: Insufficient documentation

## 2022-11-02 DIAGNOSIS — J4489 Other specified chronic obstructive pulmonary disease: Secondary | ICD-10-CM | POA: Insufficient documentation

## 2022-11-02 DIAGNOSIS — R509 Fever, unspecified: Secondary | ICD-10-CM | POA: Insufficient documentation

## 2022-11-02 DIAGNOSIS — Z1152 Encounter for screening for COVID-19: Secondary | ICD-10-CM | POA: Diagnosis not present

## 2022-11-02 DIAGNOSIS — Z86718 Personal history of other venous thrombosis and embolism: Secondary | ICD-10-CM | POA: Insufficient documentation

## 2022-11-02 NOTE — ED Triage Notes (Signed)
Patient in office with family member c/o fatigue and fever x 1d  OTC: none  Denied: N/V

## 2022-11-02 NOTE — Discharge Instructions (Addendum)
Your COVID test is pending.    Take Tylenol as needed for fever or discomfort.  Rest and keep yourself hydrated.    Follow-up with your primary care provider on Monday.

## 2022-11-02 NOTE — ED Provider Notes (Signed)
[name]    CSN: [id number] Arrival date & time: 11/02/22  7829      History   Chief Complaint Chief Complaint  Patient presents with   Fever   Fatigue    HPI [name] is a 82 y.o. male.  Accompanied by his daughter, patient presents with low-grade fever and generalized fatigue today.  Tmax 100.4.  No OTC medications taken.  He states he feels better now.  His cough and shortness of breath are at baseline for his COPD.  He denies chest pain, focal weakness, dizziness, headache, or other symptoms.  His daughter requests a COVID test.  His medical history includes COPD, obstructive asthma, recurrent pneumonia, NSTEMI, diastolic dysfunction, DVT.   The history is provided by the patient, a relative and medical records.    Past Medical History:  Diagnosis Date   AB (asthmatic bronchitis)    Abnormal liver enzymes    ABPA (allergic bronchopulmonary aspergillosis) (HCC)    Allergic rhinitis    Aortic atherosclerosis (HCC)    Asthma    CAP (community acquired pneumonia)    Cellulitis    COPD (chronic obstructive pulmonary disease) (HCC)    Coronary artery disease    a.) LHC 08/19/2016: 50% pRCA; intervention deferred opting for med. mgmt.   Diastolic dysfunction    a.) TTE 08/18/2016: EF 50%; inf HK; mild BAE, mild RV enlargement; G1DD   Diverticulitis    DVT (deep venous thrombosis) (HCC) 08/20/2016   a.) Doppler 08/20/2016: chronic appearing; occlusive DVT LEFT popliteal vein, with additional nearly occlusive DVT in the overlying distal SFV.   Dyspnea    GERD (gastroesophageal reflux disease)    Glaucoma    Headache    History of sepsis    HLD (hyperlipidemia)    Hx MRSA infection    Hx of basal cell carcinoma 02/26/2018   L lat mid back   Hypertension    Resolved - no meds   Insomnia    a.) takes malatonin   NSTEMI (non-ST elevated myocardial infarction) (HCC) 08/18/2016   a.) LHC 08/19/2016: 50% pRCA; intervention deferred opting for med. mgmt.    Osteoarthritis    Paroxysmal tachycardia (HCC)    PVC (premature ventricular contraction)     Patient Active Problem List   Diagnosis Date Noted   Fever of unknown origin 08/29/2021   Hypotension 08/29/2021   Ganglion cyst of wrist, left 08/08/2021   BPH associated with nocturia 01/15/2018   SOB (shortness of breath) 08/13/2017   Coronary artery disease involving native coronary artery of native heart 03/12/2017   History of sepsis 11/05/2016   Diverticulitis 11/05/2016   HCAP (healthcare-associated pneumonia) 08/29/2016   History of non-ST elevation myocardial infarction (NSTEMI) 08/18/2016   Chronic obstructive pulmonary disease (HCC) 01/02/2016   History of DVT (deep vein thrombosis) 05/15/2015   Recurrent pneumonia 12/21/2014   Basal cell carcinoma 10/19/2014   Abnormal liver enzymes 10/19/2014   Personal history of methicillin resistant Staphylococcus aureus 10/19/2014   Heel pain 10/19/2014   Pain in shoulder 10/19/2014   Back strain 10/19/2014   Basal cell carcinoma of skin 10/19/2014   Pulmonary aspergillosis, allergic bronchopulmonary type (HCC) 07/30/2014   Allergic bronchopulmonary aspergillosis (HCC) 07/30/2014   CAP (community acquired pneumonia) 05/03/2014   Community acquired pneumonia 05/03/2014   Paroxysmal tachycardia (HCC) 06/23/2013   Asymptomatic PVCs 06/23/2013   Beat, premature ventricular 06/23/2013   Ventricular premature beats 06/23/2013   Allergic rhinitis 01/17/2009   Atopic rhinitis 01/17/2009  Asthma, chronic obstructive, without status asthmaticus (HCC) 11/04/2008   CAFL (chronic airflow limitation) (HCC) 11/04/2008   Chronic obstructive asthma 11/04/2008   Essential hypertension 02/09/2008   Arthropathia 08/28/2007   Disorder of mitral valve 07/08/2005   Benign prostatic hyperplasia without urinary obstruction 05/16/1997   Low back pain 05/16/1997   Degenerative arthritis of hip 03/18/1995    Past Surgical History:  Procedure  Laterality Date   APPENDECTOMY     BRONCHOSCOPY     CATARACT EXTRACTION W/PHACO Left 09/01/2019   Procedure: CATARACT EXTRACTION PHACO AND INTRAOCULAR LENS PLACEMENT (IOC) LEFT 11.72 01:12.2 16.2%;  Surgeon: Lockie Mola, MD;  Location: [facility];  Service: Ophthalmology;  Laterality: Left;   CATARACT EXTRACTION W/PHACO Right 09/22/2019   Procedure: CATARACT EXTRACTION PHACO AND INTRAOCULAR LENS PLACEMENT (IOC) RIGHT;  Surgeon: Lockie Mola, MD;  Location: [facility];  Service: Ophthalmology;  Laterality: Right;  6.88 103.4 10.9%   GANGLION CYST EXCISION Left 10/19/2021   Procedure: REMOVAL GANGLION OF WRIST;  Surgeon: Deeann Saint, MD;  Location: [facility];  Service: Orthopedics;  Laterality: Left;   LEFT HEART CATH AND CORONARY ANGIOGRAPHY N/A 08/19/2016   Procedure: Left Heart Cath and Coronary Angiography;  Surgeon: Laurier Nancy, MD;  Location: [facility] INVASIVE [facility] LAB;  Service: Cardiovascular;  Laterality: N/A;   NASAL SINUS SURGERY  09/27/2014       Home Medications    Prior to Admission medications   Medication Sig Start Date End Date Taking? Authorizing Provider  albuterol (PROVENTIL HFA;VENTOLIN HFA) 108 (90 Base) MCG/ACT inhaler Inhale 2 puffs into the lungs every 6 (six) hours as needed for wheezing or shortness of breath.    [provider]  albuterol (PROVENTIL) (2.5 MG/3ML) 0.083% nebulizer solution Take 2.5 mg by nebulization in the morning and at bedtime.    [provider]  azithromycin (ZITHROMAX) 250 MG tablet Take 250 mg by mouth daily. 07/17/22   [provider]  brimonidine (ALPHAGAN) 0.15 % ophthalmic solution Place 1 drop into both eyes 2 (two) times daily. 01/04/21   [provider]  camphor-menthol Wynelle Fanny) lotion Apply 1 application. topically as needed for itching.    [provider]  Desoximetasone (TOPICORT) 0.25 % ointment APPLY TO ECZEMA OF UPPER ARM TWICE DAILYAS DIRECTED. 12/19/21    Deirdre Evener, MD  diltiazem (CARDIZEM CD) 180 MG 24 hr capsule TAKE 1 CAPSULE BY MOUTH ONCE DAILY 02/28/22   Bosie Clos, MD  dorzolamide (TRUSOPT) 2 % ophthalmic solution Place 1 drop into both eyes 2 (two) times daily.  08/31/15   [provider]  finasteride (PROSCAR) 5 MG tablet Take 1 tablet (5 mg total) by mouth daily. 04/19/22   Caro Laroche, DO  Fluticasone-Salmeterol (WIXELA INHUB IN) Inhale into the lungs.    [provider]  ketoconazole (NIZORAL) 2 % shampoo     [provider]  latanoprost (XALATAN) 0.005 % ophthalmic solution Place 1 drop into both eyes at bedtime.     [provider]  Melatonin 5 MG TABS Take 5 mg by mouth at bedtime.    [provider]  mometasone (ELOCON) 0.1 % cream     [provider]  montelukast (SINGULAIR) 10 MG tablet Take 10 mg by mouth at bedtime. 01/05/21   [provider]  omeprazole (PRILOSEC) 20 MG capsule TAKE 1 CAPSULE BY MOUTH EVERY DAY 10/14/22   Merita Norton T, FNP  Sodium Chloride, Inhalant, 7 % NEBU Take 1 vial by nebulization 2 (  two) times daily. 01/22/21   [provider]  tiotropium (SPIRIVA) 18 MCG inhalation capsule Place 18 mcg into inhaler and inhale every evening. 02/06/15   [provider]  Vibegron (GEMTESA) 75 MG TABS Take 1 tablet (75 mg total) by mouth daily. 07/22/22   MacDiarmid, Lorin Picket, MD  XARELTO 10 MG TABS tablet Take 1 tablet (10 mg total) by mouth daily. 09/03/21   Bosie Clos, MD    Family History Family History  Problem Relation Age of Onset   Breast cancer Sister    Heart failure Mother    Pancreatic cancer Sister    Alzheimer's disease Father    Brain cancer Daughter     Social History Social History   Tobacco Use   Smoking status: Former    Packs/day: 0.50    Years: 3.00    Additional pack years: 0.00    Total pack years: 1.50    Types: Cigarettes    Quit date: 06/17/1958    Years since quitting: 64.4    Smokeless tobacco: Never   Tobacco comments:    smoking while in college  Vaping Use   Vaping Use: Never used  Substance Use Topics   Alcohol use: Yes    Alcohol/week: 3.0 - 4.0 standard drinks of alcohol    Types: 3 - 4 Glasses of wine per week    Comment: occ wine   Drug use: No     Allergies   Timolol maleate [timolol maleate], Atorvastatin, Bactrim [sulfamethoxazole-trimethoprim], Lisinopril, and Symbicort [budesonide-formoterol fumarate]   Review of Systems Review of Systems  Constitutional:  Positive for fatigue and fever. Negative for chills.  HENT:  Negative for ear pain and sore throat.   Respiratory:  Negative for cough and shortness of breath.   Cardiovascular:  Negative for chest pain and palpitations.  Gastrointestinal:  Negative for abdominal pain, diarrhea, nausea and vomiting.  Genitourinary:  Negative for dysuria and hematuria.  All other systems reviewed and are negative.    Physical Exam Triage Vital Signs ED Triage Vitals [11/02/22 1028]  Enc Vitals Group     BP      Pulse      Resp      Temp      Temp src      SpO2      Weight 165 lb (74.8 kg)     Height 6' (1.829 m)     Head Circumference      Peak Flow      Pain Score 0     Pain Loc      Pain Edu?      Excl. in GC?    No data found.  Updated Vital Signs BP 104/63   Pulse (!) 102   Temp 99.8 F (37.7 C) (Oral)   Resp 18   Ht 6' (1.829 m)   Wt 165 lb (74.8 kg)   SpO2 96%   BMI 22.38 kg/m   Visual Acuity Right Eye Distance:   Left Eye Distance:   Bilateral Distance:    Right Eye Near:   Left Eye Near:    Bilateral Near:     Physical Exam Vitals and nursing note reviewed.  Constitutional:      General: He is not in acute distress.    Appearance: Normal appearance. He is well-developed. He is not ill-appearing.  HENT:     Right Ear: Tympanic membrane normal.     Left Ear: Tympanic membrane normal.     Nose: Nose  normal.     Mouth/Throat:     Mouth: Mucous membranes  are moist.     Pharynx: Oropharynx is clear.  Cardiovascular:     Rate and Rhythm: Normal rate and regular rhythm.     Heart sounds: Normal heart sounds.  Pulmonary:     Effort: Pulmonary effort is normal. No respiratory distress.     Breath sounds: Normal breath sounds. No wheezing, rhonchi or rales.  Musculoskeletal:     Cervical back: Neck supple.  Skin:    General: Skin is warm and dry.  Neurological:     General: No focal deficit present.     Mental Status: He is alert.     Sensory: No sensory deficit.     Motor: No weakness.     Gait: Gait normal.  Psychiatric:        Mood and Affect: Mood normal.        Behavior: Behavior normal.      UC Treatments / Results  Labs (all labs ordered are listed, but only abnormal results are displayed) Labs Reviewed  SARS CORONAVIRUS 2 (TAT 6-24 HRS)    EKG   Radiology No results found.  Procedures Procedures (including critical care time)  Medications Ordered in UC Medications - No data to display  Initial Impression / Assessment and Plan / UC Course  I have reviewed the triage vital signs and the nursing notes.  Pertinent labs & imaging results that were available during my care of the patient were reviewed by me and considered in my medical decision making (see chart for details).    Fatigue, fever.  No respiratory distress, vital signs are stable.  COVID pending.  Discussed symptomatic treatment including Tylenol, rest, hydration.  Instructed patient to follow up with his PCP on Monday.  ED precautions given.  Patient and his daughter agree to plan of care.   Final Clinical Impressions(s) / UC Diagnoses   Final diagnoses:  Fatigue, unspecified type  Fever, unspecified     Discharge Instructions      Your COVID test is pending.    Take Tylenol as needed for fever or discomfort.  Rest and keep yourself hydrated.    Follow-up with your primary care provider on Monday.         ED Prescriptions   None     PDMP not reviewed this encounter.   Mickie Bail, NP 11/02/22 [phone]

## 2022-11-03 LAB — SARS CORONAVIRUS 2 (TAT 6-24 HRS): SARS Coronavirus 2: NEGATIVE

## 2022-11-04 ENCOUNTER — Ambulatory Visit: Payer: PPO | Admitting: Urology

## 2022-11-04 ENCOUNTER — Telehealth: Payer: Self-pay

## 2022-11-04 ENCOUNTER — Encounter: Payer: Self-pay | Admitting: Urology

## 2022-11-04 VITALS — BP 128/76 | HR 79 | Ht 72.0 in | Wt 163.0 lb

## 2022-11-04 DIAGNOSIS — R351 Nocturia: Secondary | ICD-10-CM

## 2022-11-04 DIAGNOSIS — R350 Frequency of micturition: Secondary | ICD-10-CM | POA: Diagnosis not present

## 2022-11-04 LAB — MICROSCOPIC EXAMINATION: Bacteria, UA: NONE SEEN

## 2022-11-04 LAB — URINALYSIS, COMPLETE
Bilirubin, UA: NEGATIVE
Glucose, UA: NEGATIVE
Ketones, UA: NEGATIVE
Leukocytes,UA: NEGATIVE
Nitrite, UA: NEGATIVE
Protein,UA: NEGATIVE
RBC, UA: NEGATIVE
Specific Gravity, UA: 1.01 (ref 1.005–1.030)
Urobilinogen, Ur: 0.2 mg/dL (ref 0.2–1.0)
pH, UA: 6.5 (ref 5.0–7.5)

## 2022-11-04 NOTE — Telephone Encounter (Signed)
Patient advised. Appointments made. 

## 2022-11-04 NOTE — Patient Instructions (Signed)

## 2022-11-04 NOTE — Telephone Encounter (Signed)
Dr Sherron Monday would like patient to start PTNS. Per [employer] insurance representative Danielle no PA is needed. There is a $20 co pay. He does have an out of pocket with his insurance of $3200 that $129.42 has been met so far. Once he meets that amount insurance will pick up everything 100%. Reference number for the call [phone]   I left a message for patient to call me back

## 2022-11-04 NOTE — Progress Notes (Signed)
11/04/2022 8:23 AM   [name] [birth year] [id number]  Referring provider: Erasmo Downer, MD [address] [place],  [place] [address]  No chief complaint on file.   HPI: I was consulted to assess the patient's acute onset of nighttime frequency every 2 hours.  It is now every 3 hours.  He stopped gabapentin which had been added and he had leg edema.  Right leg is normalized.  Left leg much improved.  He is still using TED hose.  Flow is reasonable.  Frequency every 2 hours during the day.   Patient nighttime frequency likely from fluid mobilization.  Role of alpha-blocker versus watchful waiting described.  Patient has an ill-defined right middle discomfort that I did not think was from the genitourinary system.  It comes and goes and is been present for a few weeks.  We did not x-ray him today.  He would like to try Flomax.  Reassess in 6 weeks.  If he normalizes we might want to try him off it in the future.   Flow is much better on the medication but still gets up every 2 hours to void.  No infections or blood   Flow was good.  Mild frequency and nocturia stable.     Frequency stable.  Still gets about every 2 hours at night.  Sometimes flow is weak.  Not interested in surgery patient on Flomax 0.8 mg   Still goes up every 90 minutes to 2 hours at night.  He had a embolization procedure on his prostate 3 weeks ago in [place] but it did not help.     Patient has nighttime frequency.  Multiple causes have been discussed in the past.  I agreed that it was okay to stop his finasteride and Flomax.  I gave Myrbetriq 50 mg samples and prescription and we will see if we can help him with overactive bladder medications and/or percutaneous tibial nerve stimulation.  Reassess 6-7 weeks     Voids every 2 to 2 and half hours at night.  Flow is reasonable.  Still on finasteride.  No infections.     Stop Myrbetriq. See in 6 or 7 weeks on Gemtesa. Oxybutynin percutaneous tibial  nerve stimulation or options.    Today Voiding every 2 and half hours which is an improvement in his nocturia.  He like to stay on the [place].  Frequency stable.  No infections.  I reviewed oxybutynin and percutaneous tibial nerve stimulation and gave him a handout.    Today Modest improvement for refractory frequency and urgency and nocturia on Gemtesa.  Clinically not infected   PMH: Past Medical History:  Diagnosis Date   AB (asthmatic bronchitis)    Abnormal liver enzymes    ABPA (allergic bronchopulmonary aspergillosis) (HCC)    Allergic rhinitis    Aortic atherosclerosis (HCC)    Asthma    CAP (community acquired pneumonia)    Cellulitis    COPD (chronic obstructive pulmonary disease) (HCC)    Coronary artery disease    a.) LHC 08/19/2016: 50% pRCA; intervention deferred opting for med. mgmt.   Diastolic dysfunction    a.) TTE 08/18/2016: EF 50%; inf HK; mild BAE, mild RV enlargement; G1DD   Diverticulitis    DVT (deep venous thrombosis) (HCC) 08/20/2016   a.) Doppler 08/20/2016: chronic appearing; occlusive DVT LEFT popliteal vein, with additional nearly occlusive DVT in the overlying distal SFV.   Dyspnea    GERD (gastroesophageal reflux disease)  Glaucoma    Headache    History of sepsis    HLD (hyperlipidemia)    Hx MRSA infection    Hx of basal cell carcinoma 02/26/2018   L lat mid back   Hypertension    Resolved - no meds   Insomnia    a.) takes malatonin   NSTEMI (non-ST elevated myocardial infarction) (HCC) 08/18/2016   a.) LHC 08/19/2016: 50% pRCA; intervention deferred opting for med. mgmt.   Osteoarthritis    Paroxysmal tachycardia (HCC)    PVC (premature ventricular contraction)     Surgical History: Past Surgical History:  Procedure Laterality Date   APPENDECTOMY     BRONCHOSCOPY     CATARACT EXTRACTION W/PHACO Left 09/01/2019   Procedure: CATARACT EXTRACTION PHACO AND INTRAOCULAR LENS PLACEMENT (IOC) LEFT 11.72 01:12.2 16.2%;  Surgeon:  Lockie Mola, MD;  Location: [facility];  Service: Ophthalmology;  Laterality: Left;   CATARACT EXTRACTION W/PHACO Right 09/22/2019   Procedure: CATARACT EXTRACTION PHACO AND INTRAOCULAR LENS PLACEMENT (IOC) RIGHT;  Surgeon: Lockie Mola, MD;  Location: [facility];  Service: Ophthalmology;  Laterality: Right;  6.88 103.4 10.9%   GANGLION CYST EXCISION Left 10/19/2021   Procedure: REMOVAL GANGLION OF WRIST;  Surgeon: Deeann Saint, MD;  Location: [facility] OR[facility];  Service: Orthopedics;  Laterality: Left;   LEFT HEART CATH AND CORONARY ANGIOGRAPHY N/A 08/19/2016   Procedure: Left Heart Cath and Coronary Angiography;  Surgeon: Laurier Nancy, MD;  Location: [facility] INV[facility];  Service: Cardiovascular;  Laterality: N/A;   NASAL SINUS SURGERY  09/27/2014    Home Medications:  Allergies as of 11/04/2022       Reactions   Timolol Maleate [timolol Maleate] Shortness Of Breath   Atorvastatin Swelling   Swelling in legs, dizziness   Bactrim [sulfamethoxazole-trimethoprim]    Pt says "it takes his skin pigment off   Lisinopril Other (See Comments)   cough   Symbicort [budesonide-formoterol Fumarate] Other (See Comments)   SYMBICORT Patient reports dizziness and trembling/shaky feeling.        Medication List        Accurate as of Nov 04, 2022  8:23 AM. If you have any questions, ask your nurse or doctor.          albuterol 108 (90 Base) MCG/ACT inhaler Commonly known as: VENTOLIN HFA Inhale 2 puffs into the lungs every 6 (six) hours as needed for wheezing or shortness of breath.   albuterol (2.5 MG/3ML) 0.083% nebulizer solution Commonly known as: PROVENTIL Take 2.5 mg by nebulization in the morning and at bedtime.   azithromycin 250 MG tablet Commonly known as: ZITHROMAX Take 250 mg by mouth daily.   brimonidine 0.15 % ophthalmic solution Commonly known as: ALPHAGAN Place 1 drop into both eyes 2 (two) times daily.   camphor-menthol lotion Commonly  known as: SARNA Apply 1 application. topically as needed for itching.   Desoximetasone 0.25 % ointment Commonly known as: TOPICORT APPLY TO ECZEMA OF UPPER ARM TWICE DAILYAS DIRECTED.   diltiazem 180 MG 24 hr capsule Commonly known as: CARDIZEM CD TAKE 1 CAPSULE BY MOUTH ONCE DAILY   dorzolamide 2 % ophthalmic solution Commonly known as: TRUSOPT Place 1 drop into both eyes 2 (two) times daily.   finasteride 5 MG tablet Commonly known as: PROSCAR Take 1 tablet (5 mg total) by mouth daily.   Gemtesa 75 MG Tabs Generic drug: Vibegron Take 1 tablet (75 mg total) by mouth daily.   ketoconazole 2 % shampoo Commonly known  as: NIZORAL   latanoprost 0.005 % ophthalmic solution Commonly known as: XALATAN Place 1 drop into both eyes at bedtime.   melatonin 5 MG Tabs Take 5 mg by mouth at bedtime.   mometasone 0.1 % cream Commonly known as: ELOCON   montelukast 10 MG tablet Commonly known as: SINGULAIR Take 10 mg by mouth at bedtime.   omeprazole 20 MG capsule Commonly known as: PRILOSEC TAKE 1 CAPSULE BY MOUTH EVERY DAY   Sodium Chloride (Inhalant) 7 % Nebu Take 1 vial by nebulization 2 (two) times daily.   tiotropium 18 MCG inhalation capsule Commonly known as: SPIRIVA Place 18 mcg into inhaler and inhale every evening.   WIXELA INHUB IN Inhale into the lungs.   Xarelto 10 MG Tabs tablet Generic drug: rivaroxaban Take 1 tablet (10 mg total) by mouth daily.        Allergies:  Allergies  Allergen Reactions   Timolol Maleate [Timolol Maleate] Shortness Of Breath   Atorvastatin Swelling    Swelling in legs, dizziness   Bactrim [Sulfamethoxazole-Trimethoprim]     Pt says "it takes his skin pigment off   Lisinopril Other (See Comments)    cough   Symbicort [Budesonide-Formoterol Fumarate] Other (See Comments)    SYMBICORT Patient reports dizziness and trembling/shaky feeling.    Family History: Family History  Problem Relation Age of Onset   Breast  cancer Sister    Heart failure Mother    Pancreatic cancer Sister    Alzheimer's disease Father    Brain cancer Daughter     Social History:  reports that he quit smoking about 64 years ago. His smoking use included cigarettes. He has a 1.50 pack-year smoking history. He has never used smokeless tobacco. He reports current alcohol use of about 3.0 - 4.0 standard drinks of alcohol per week. He reports that he does not use drugs.  ROS:                                        Physical Exam: There were no vitals taken for this visit.  Constitutional:  Alert and oriented, No acute distress. HEENT: [place] AT, moist mucus membranes.  Trachea midline, no masses.   Laboratory Data: Lab Results  Component Value Date   WBC 8.3 03/12/2022   HGB 15.7 03/12/2022   HCT 45.5 03/12/2022   MCV 95 03/12/2022   PLT 227 03/12/2022    Lab Results  Component Value Date   CREATININE 1.09 03/12/2022    Lab Results  Component Value Date   PSA 3.6 11/17/2013    No results found for: "TESTOSTERONE"  Lab Results  Component Value Date   HGBA1C 5.4 08/18/2016    Urinalysis    Component Value Date/Time   COLORURINE YELLOW (A) 04/29/2018 1335   APPEARANCEUR Clear 07/22/2022 0856   LABSPEC 1.014 04/29/2018 1335   LABSPEC 1.012 04/07/2014 1745   PHURINE 7.0 04/29/2018 1335   GLUCOSEU Negative 07/22/2022 0856   GLUCOSEU Negative 04/07/2014 1745   HGBUR NEGATIVE 04/29/2018 1335   BILIRUBINUR Negative 07/22/2022 0856   BILIRUBINUR Negative 04/07/2014 1745   KETONESUR 20 (A) 04/29/2018 1335   PROTEINUR Negative 07/22/2022 0856   PROTEINUR NEGATIVE 04/29/2018 1335   UROBILINOGEN 0.2 08/29/2021 1558   NITRITE Negative 07/22/2022 0856   NITRITE NEGATIVE 04/29/2018 1335   LEUKOCYTESUR Negative 07/22/2022 0856   LEUKOCYTESUR Negative 04/07/2014 1745    Pertinent Imaging:  Assessment & Plan: Russians answered about percutaneous tibial nerve stimulation.  Behavioral  therapy again discussed.  He like to proceed  There are no diagnoses linked to this encounter.  No follow-ups on file.  Martina Sinner, MD  [facility] [address] [place], [place] [address] [phone]

## 2022-11-11 ENCOUNTER — Other Ambulatory Visit: Payer: Self-pay | Admitting: Family Medicine

## 2022-11-13 ENCOUNTER — Ambulatory Visit: Payer: PPO | Admitting: Physician Assistant

## 2022-11-13 ENCOUNTER — Other Ambulatory Visit: Payer: Self-pay | Admitting: Family Medicine

## 2022-11-13 DIAGNOSIS — R351 Nocturia: Secondary | ICD-10-CM | POA: Diagnosis not present

## 2022-11-13 DIAGNOSIS — N401 Enlarged prostate with lower urinary tract symptoms: Secondary | ICD-10-CM

## 2022-11-13 NOTE — Progress Notes (Signed)
PTNS  Session # 1 of 45  Health & Social Factors: no change Caffeine: 0 Alcohol: <1 Daytime voids #per day: Q2 Night-time voids #per night: Q1-2 Urgency: strong Incontinence Episodes #per day: 0 Ankle used: right Treatment Setting: 9 Feeling/ Response: sensory Comments: Patient tolerated well  Performed By: Carman Ching, PA-C   Additional notes: Consent form signed.  Follow Up: 1 week for PTNS #2

## 2022-11-13 NOTE — Telephone Encounter (Signed)
Requested Prescriptions  Pending Prescriptions Disp Refills   omeprazole (PRILOSEC) 20 MG capsule [Pharmacy Med Name: OMEPRAZOLE DR 20 MG CAP] 90 capsule 0    Sig: TAKE 1 CAPSULE BY MOUTH EVERY DAY     Gastroenterology: Proton Pump Inhibitors Passed - 11/11/2022  1:06 PM      Passed - Valid encounter within last 12 months    Recent Outpatient Visits           8 months ago Annual physical exam   [facility] Bosie Clos, MD   11 months ago Diverticulitis   [facility] Bosie Clos, MD   11 months ago Cellulitis of lower extremity, unspecified laterality   [facility] Bosie Clos, MD   12 months ago Cellulitis of lower extremity, unspecified laterality   [place] [facility] [place], [place], PA-C   1 year ago Cellulitis of lower extremity, unspecified laterality   [place] [facility] [place], [place], [place]       Future Appointments     Next 5 Appointments             Today Carman Ching, PA-C [facility] [place]   In 1 week [place], [place] [facility] [place]   In 2 weeks Carman Ching, PA-C [facility] [place]   In 2 weeks Carman Ching, PA-C [facility] [place]   In 4 weeks Carman Ching, [place] [facility] [place]         Displaying the next 5 appointments. This patient has additional appointments scheduled.

## 2022-11-13 NOTE — Patient Instructions (Signed)
Tracking Your Bladder Symptoms   Patient Name:___________________________________________________  Example: Day   Daytime Voids  Nighttime Voids Urgency for the Day (none, mild, strong, severe) Number of Accidents/ Leaks Beverage Comments  Monday IIII II Strong I Water IIII Coffee  I     Week Starting:____________________________________  Day Daytime  Voids Nighttime  Voids Urgency for the Day (none, mild, strong, severe) Number of Accidents/ Leaks Beverages Comments                                                           This week my symptoms were:  O much better O better O the same O worse   

## 2022-11-20 ENCOUNTER — Ambulatory Visit: Payer: PPO | Admitting: Urology

## 2022-11-20 ENCOUNTER — Encounter: Payer: Self-pay | Admitting: Urology

## 2022-11-20 VITALS — BP 153/77 | HR 77 | Ht 72.0 in | Wt 165.0 lb

## 2022-11-20 DIAGNOSIS — R350 Frequency of micturition: Secondary | ICD-10-CM | POA: Diagnosis not present

## 2022-11-20 NOTE — Progress Notes (Signed)
PTNS  Session #2   Health & Social Factors: no Caffeine: 2 Alcohol: 0 Daytime voids #per day: 10 Night-time voids #per night: 10 Urgency: mild Incontinence Episodes #per day: 0 Ankle used: left Treatment Setting: 9 Feeling/ Response: sensory Comments: Tolerated well  Performed By: August Luz, RN   Follow Up: 1 week follow up for PTNS

## 2022-11-27 ENCOUNTER — Ambulatory Visit: Payer: PPO | Admitting: Physician Assistant

## 2022-11-27 DIAGNOSIS — R350 Frequency of micturition: Secondary | ICD-10-CM | POA: Diagnosis not present

## 2022-11-27 NOTE — Progress Notes (Signed)
PTNS  Session # 3  Health & Social Factors: no change Caffeine: 2 Alcohol: 2-3/week Daytime voids #per day: Q2 Night-time voids #per night: Q2 Urgency: mild Incontinence Episodes #per day: none Ankle used: left Treatment Setting: 5 Feeling/ Response: toe flex Comments: Patient tolerated well.  Performed By: Carman Ching, PA-C   Follow Up: 1 week for PTNS #4

## 2022-11-27 NOTE — Patient Instructions (Signed)
Tracking Your Bladder Symptoms   Patient Name:___________________________________________________  Example: Day   Daytime Voids  Nighttime Voids Urgency for the Day (none, mild, strong, severe) Number of Accidents/ Leaks Beverage Comments  Monday IIII II Strong I Water IIII Coffee  I     Week Starting:____________________________________  Day Daytime  Voids Nighttime  Voids Urgency for the Day (none, mild, strong, severe) Number of Accidents/ Leaks Beverages Comments                                                           This week my symptoms were:  O much better O better O the same O worse   

## 2022-12-03 ENCOUNTER — Ambulatory Visit: Payer: PPO | Admitting: Physician Assistant

## 2022-12-03 ENCOUNTER — Ambulatory Visit: Payer: PPO

## 2022-12-03 DIAGNOSIS — R350 Frequency of micturition: Secondary | ICD-10-CM

## 2022-12-03 NOTE — Patient Instructions (Signed)
Tracking Your Bladder Symptoms   Patient Name:___________________________________________________  Example: Day   Daytime Voids  Nighttime Voids Urgency for the Day (none, mild, strong, severe) Number of Accidents/ Leaks Beverage Comments  Monday IIII II Strong I Water IIII Coffee  I     Week Starting:____________________________________  Day Daytime  Voids Nighttime  Voids Urgency for the Day (none, mild, strong, severe) Number of Accidents/ Leaks Beverages Comments                                                           This week my symptoms were:  O much better O better O the same O worse   

## 2022-12-03 NOTE — Progress Notes (Signed)
PTNS   Session # 4   Health & Social Factors: no change Caffeine: 2 Alcohol: 2-3/week Daytime voids #per day: Q2 Night-time voids #per night: Q2 Urgency: mild Incontinence Episodes #per day: none Ankle used: left Treatment Setting: 6 Feeling/ Response: toe flex Comments: Patient tolerated well.   Performed By: Ples Specter CMA    Follow Up: 1 week for PTNS #5

## 2022-12-09 DIAGNOSIS — R768 Other specified abnormal immunological findings in serum: Secondary | ICD-10-CM | POA: Diagnosis not present

## 2022-12-09 DIAGNOSIS — J4550 Severe persistent asthma, uncomplicated: Secondary | ICD-10-CM | POA: Diagnosis not present

## 2022-12-09 DIAGNOSIS — J449 Chronic obstructive pulmonary disease, unspecified: Secondary | ICD-10-CM | POA: Diagnosis not present

## 2022-12-11 ENCOUNTER — Encounter: Payer: Self-pay | Admitting: Physician Assistant

## 2022-12-11 ENCOUNTER — Ambulatory Visit: Payer: PPO | Admitting: Physician Assistant

## 2022-12-11 VITALS — BP 132/76 | HR 69 | Ht 72.0 in | Wt 162.5 lb

## 2022-12-11 DIAGNOSIS — R350 Frequency of micturition: Secondary | ICD-10-CM | POA: Diagnosis not present

## 2022-12-11 NOTE — Patient Instructions (Signed)
Tracking Your Bladder Symptoms   Patient Name:___________________________________________________  Example: Day   Daytime Voids  Nighttime Voids Urgency for the Day (none, mild, strong, severe) Number of Accidents/ Leaks Beverage Comments  Monday IIII II Strong I Water IIII Coffee  I     Week Starting:____________________________________  Day Daytime  Voids Nighttime  Voids Urgency for the Day (none, mild, strong, severe) Number of Accidents/ Leaks Beverages Comments                                                           This week my symptoms were:  O much better O better O the same O worse   

## 2022-12-11 NOTE — Progress Notes (Signed)
PTNS  Session # 5  Health & Social Factors: no change Caffeine: 2 Alcohol: <1 Daytime voids #per day: 6 Night-time voids #per night: 4 Urgency: mild Incontinence Episodes #per day: 0 Ankle used: left Treatment Setting: 8 Feeling/ Response: both Comments: Patient tolerated well.  Performed By: Carman Ching, PA-C   Follow Up: 1 week for PTNS #6

## 2022-12-18 ENCOUNTER — Ambulatory Visit: Payer: PPO | Admitting: Urology

## 2022-12-18 DIAGNOSIS — R350 Frequency of micturition: Secondary | ICD-10-CM

## 2022-12-18 NOTE — Patient Instructions (Signed)
Tracking Your Bladder Symptoms   Patient Name:___________________________________________________  Example: Day   Daytime Voids  Nighttime Voids Urgency for the Day (none, mild, strong, severe) Number of Accidents/ Leaks Beverage Comments  Monday IIII II Strong I Water IIII Coffee  I     Week Starting:____________________________________  Day Daytime  Voids Nighttime  Voids Urgency for the Day (none, mild, strong, severe) Number of Accidents/ Leaks Beverages Comments                                                           This week my symptoms were:  O much better O better O the same O worse   

## 2022-12-18 NOTE — Progress Notes (Signed)
PTNS   Session # 6   Health & Social Factors: no change Caffeine: 2 Alcohol: 2-3 weekly Daytime voids #per day: 6 Night-time voids #per night: 4 Urgency: mild Incontinence Episodes #per day: 0 Ankle used: left Treatment Setting: 5 Feeling/ Response: both Comments: Patient tolerated well.   Performed By: Ples Specter CMA   Follow Up: 1 week for PTNS #7

## 2022-12-25 ENCOUNTER — Ambulatory Visit: Payer: PPO | Admitting: Physician Assistant

## 2022-12-25 DIAGNOSIS — R350 Frequency of micturition: Secondary | ICD-10-CM | POA: Diagnosis not present

## 2022-12-25 NOTE — Patient Instructions (Signed)
Tracking Your Bladder Symptoms   Patient Name:___________________________________________________  Example: Day   Daytime Voids  Nighttime Voids Urgency for the Day (none, mild, strong, severe) Number of Accidents/ Leaks Beverage Comments  Monday IIII II Strong I Water IIII Coffee  I     Week Starting:____________________________________  Day Daytime  Voids Nighttime  Voids Urgency for the Day (none, mild, strong, severe) Number of Accidents/ Leaks Beverages Comments                                                           This week my symptoms were:  O much better O better O the same O worse   

## 2022-12-25 NOTE — Progress Notes (Signed)
PTNS  Session # 7  Health & Social Factors: NONE Caffeine: 2 Alcohol: 2 X weekly Daytime voids #per day: 5 Night-time voids #per night: 3 Urgency: mild Incontinence Episodes #per day: 0 Ankle used: left Treatment Setting: 13 Feeling/ Response: both Comments: tingling behind his toes, his toes wiggled  Performed By: Maryland Pink RMA  Follow Up: F/UP as scheduled next week

## 2023-01-01 ENCOUNTER — Ambulatory Visit: Payer: PPO | Admitting: Podiatry

## 2023-01-01 ENCOUNTER — Ambulatory Visit: Payer: PPO | Admitting: Urology

## 2023-01-01 ENCOUNTER — Encounter: Payer: Self-pay | Admitting: Podiatry

## 2023-01-01 DIAGNOSIS — M19071 Primary osteoarthritis, right ankle and foot: Secondary | ICD-10-CM

## 2023-01-01 DIAGNOSIS — R350 Frequency of micturition: Secondary | ICD-10-CM | POA: Diagnosis not present

## 2023-01-01 MED ORDER — TRIAMCINOLONE ACETONIDE 40 MG/ML IJ SUSP
20.0000 mg | Freq: Once | INTRAMUSCULAR | Status: AC
Start: 2023-01-01 — End: 2023-01-01
  Administered 2023-01-01: 20 mg via INTRAMUSCULAR

## 2023-01-01 MED ORDER — GEMTESA 75 MG PO TABS: 75.0000 mg | ORAL_TABLET | Freq: Every day | ORAL | Status: DC

## 2023-01-01 MED ORDER — TRIAMCINOLONE ACETONIDE 0.1 % EX CREA: 1.0000 | TOPICAL_CREAM | Freq: Two times a day (BID) | CUTANEOUS | 0 refills | Status: DC

## 2023-01-01 NOTE — Progress Notes (Signed)
PTNS  Session # 8  Health & Social Factors: NONE Caffeine: 2 Alcohol: 2 X weekly Daytime voids #per day: 6 Night-time voids #per night: 3 Urgency: mild Incontinence Episodes #per day: 0 Ankle used: left Treatment Setting: 16 Feeling/ Response: both Comments: felt sensation on bottom of foot and toe wiggling  Performed By: Malcolm Metro RMA  Follow Up: F/UP as scheduled for next

## 2023-01-01 NOTE — Patient Instructions (Addendum)

## 2023-01-01 NOTE — Progress Notes (Signed)
[name] presents today for follow-up of his capsulitis of the right foot.  States that 2 out of 3 8 painful areas on the right foot have resolved he states that this was still remains as he has not asked marking the spot.  Objective: Vital signs are stable alert and oriented x 3.  Pulses are palpable to the right foot.  He has an X overlying the proximal aspect of the fourth fifth tarsometatarsal joint area he has no reproducible pain on range of motion of the fourth or fifth metatarsal that he does have pain on palpation of the fourth fifth tarsometatarsal joint no pain on palpation of the medial calcaneus.  He does have tenderness on palpation of the dorsal aspect of the foot.  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  Plan: I injected the area today with 10 mg of Kenalog and local anesthetic.  Tolerated procedure well.  I will follow-up with him in 4 months

## 2023-01-08 ENCOUNTER — Encounter: Payer: Self-pay | Admitting: Physician Assistant

## 2023-01-08 ENCOUNTER — Ambulatory Visit: Payer: PPO | Admitting: Physician Assistant

## 2023-01-08 VITALS — Ht 72.0 in | Wt 162.0 lb

## 2023-01-08 DIAGNOSIS — R351 Nocturia: Secondary | ICD-10-CM

## 2023-01-08 NOTE — Progress Notes (Signed)
PTNS  Session # 9  Health & Social Factors: no change Caffeine: 2 Alcohol: <1 Daytime voids #per day: 6 Night-time voids #per night: 3 Urgency: mild Incontinence Episodes #per day: 0 Ankle used: left Treatment Setting: 9 Feeling/ Response: both Comments: Patient tolerated well.  Performed By: Carman Ching, PA-C   Follow Up: 1 week for PTNS #10

## 2023-01-09 DIAGNOSIS — I479 Paroxysmal tachycardia, unspecified: Secondary | ICD-10-CM | POA: Diagnosis not present

## 2023-01-09 DIAGNOSIS — I25119 Atherosclerotic heart disease of native coronary artery with unspecified angina pectoris: Secondary | ICD-10-CM | POA: Diagnosis not present

## 2023-01-09 DIAGNOSIS — Z86718 Personal history of other venous thrombosis and embolism: Secondary | ICD-10-CM | POA: Diagnosis not present

## 2023-01-09 DIAGNOSIS — I440 Atrioventricular block, first degree: Secondary | ICD-10-CM | POA: Diagnosis not present

## 2023-01-13 NOTE — Progress Notes (Unsigned)
PTNS  Session # ***  Health & Social Factors: *** Caffeine: *** Alcohol: *** Daytime voids #per day: *** Night-time voids #per night: *** Urgency: *** Incontinence Episodes #per day: *** Ankle used: *** Treatment Setting: *** Feeling/ Response: *** Comments: ***  Performed By: ***  Follow Up: ***  

## 2023-01-14 ENCOUNTER — Ambulatory Visit: Payer: PPO | Admitting: Urology

## 2023-01-14 DIAGNOSIS — R350 Frequency of micturition: Secondary | ICD-10-CM

## 2023-01-14 DIAGNOSIS — R351 Nocturia: Secondary | ICD-10-CM | POA: Diagnosis not present

## 2023-01-14 NOTE — Patient Instructions (Signed)

## 2023-01-15 ENCOUNTER — Ambulatory Visit: Payer: PPO | Admitting: Urology

## 2023-01-22 ENCOUNTER — Ambulatory Visit: Payer: PPO | Admitting: Physician Assistant

## 2023-01-22 DIAGNOSIS — R351 Nocturia: Secondary | ICD-10-CM | POA: Diagnosis not present

## 2023-01-22 NOTE — Progress Notes (Signed)
PTNS  Session # 11  Health & Social Factors: no change Caffeine: 2 cups Alcohol: 2 glasses of wine per week Daytime voids #per day: 6 Night-time voids #per night: 3 Urgency: mild Incontinence Episodes #per day: 0 Ankle used: left Treatment Setting: 11 Feeling/ Response: both Comments: pt tolerated well  Performed By: Randa Lynn, RMA  Follow Up: 1 week

## 2023-01-28 NOTE — Progress Notes (Unsigned)
PTNS  Session # 12  Health & Social Factors: No changes Caffeine: 2  Alcohol: 0 Daytime voids #per day: 6 Night-time voids #per night: 2 Urgency: Mild Incontinence Episodes #per day: 0 Ankle used: Left Treatment Setting: 3 Feeling/ Response: Sensory Comments: He had taken his last Gemtesa yesterday.  Per his insurance, Leslye Peer is not on his formulary.  I gave him 4 weeks samples.  He feels the treatments have been worthwhile and would like to continue on with the monthly maintenance.  Performed By: Michiel Cowboy, PA-C   Follow Up: One month for maintenance PTNS

## 2023-01-29 ENCOUNTER — Ambulatory Visit: Payer: PPO | Admitting: Urology

## 2023-01-29 VITALS — BP 131/81 | HR 75 | Ht 72.0 in | Wt 162.0 lb

## 2023-01-29 DIAGNOSIS — R351 Nocturia: Secondary | ICD-10-CM

## 2023-01-29 MED ORDER — GEMTESA 75 MG PO TABS: 75.0000 mg | ORAL_TABLET | Freq: Every day | ORAL | Status: DC

## 2023-02-14 ENCOUNTER — Other Ambulatory Visit: Payer: Self-pay | Admitting: Family Medicine

## 2023-02-14 DIAGNOSIS — N401 Enlarged prostate with lower urinary tract symptoms: Secondary | ICD-10-CM

## 2023-02-14 NOTE — Telephone Encounter (Signed)
Requested medication (s) are due for refill today: routing for review  Requested medication (s) are on the active medication list: yes  Last refill:  11/13/22  Future visit scheduled: yes  Notes to clinic:  After review of chart, did not see any OV w/ Dr. Beryle Flock, routing for approval.     Requested Prescriptions  Pending Prescriptions Disp Refills   finasteride (PROSCAR) 5 MG tablet [Pharmacy Med Name: FINASTERIDE 5 MG TAB] 90 tablet 0    Sig: TAKE ONE TABLET BY MOUTH EVERY DAY     Urology: 5-alpha Reductase Inhibitors Passed - 02/14/2023  2:02 PM      Passed - PSA in normal range and within 360 days    PSA  Date Value Ref Range Status  11/17/2013 3.6  Final   Prostate Specific Ag, Serum  Date Value Ref Range Status  03/12/2022 3.0 0.0 - 4.0 ng/mL Final    Comment:    Roche ECLIA methodology. According to the American Urological Association, Serum PSA should decrease and remain at undetectable levels after radical prostatectomy. The AUA defines biochemical recurrence as an initial PSA value 0.2 ng/mL or greater followed by a subsequent confirmatory PSA value 0.2 ng/mL or greater. Values obtained with different assay methods or kits cannot be used interchangeably. Results cannot be interpreted as absolute evidence of the presence or absence of malignant disease.          Passed - Valid encounter within last 12 months    Recent Outpatient Visits           11 months ago Annual physical exam   [facility] Bosie Clos, MD   1 year ago Diverticulitis   [facility] Bosie Clos, MD   1 year ago Cellulitis of lower extremity, unspecified laterality   [facility] Bosie Clos, MD   1 year ago Cellulitis of lower extremity, unspecified laterality   [facility], [place], PA-C   1 year ago Cellulitis of lower extremity, unspecified laterality    [facility] [place], Edmon Crape, [place]       Future Appointments     Next 5 Appointments             In 2 weeks McGowan, Elana Alm [facility] [place]   In 3 weeks McGowan, Elana Alm [facility] [place]   In 1 month Bacigalupo, Marzella Schlein, MD [facility]   In 1 month McGowan, Elana Alm [facility] [place]   In 1 month McGowan, Elana Alm [facility] [place]         Displaying the next 5 appointments. This patient has additional appointments scheduled.

## 2023-02-19 ENCOUNTER — Ambulatory Visit: Payer: PPO | Admitting: Dermatology

## 2023-02-19 ENCOUNTER — Ambulatory Visit: Payer: PPO | Admitting: Urology

## 2023-02-19 ENCOUNTER — Encounter: Payer: Self-pay | Admitting: Dermatology

## 2023-02-19 DIAGNOSIS — L570 Actinic keratosis: Secondary | ICD-10-CM

## 2023-02-19 DIAGNOSIS — W908XXA Exposure to other nonionizing radiation, initial encounter: Secondary | ICD-10-CM

## 2023-02-19 DIAGNOSIS — C44622 Squamous cell carcinoma of skin of right upper limb, including shoulder: Secondary | ICD-10-CM | POA: Diagnosis not present

## 2023-02-19 DIAGNOSIS — D492 Neoplasm of unspecified behavior of bone, soft tissue, and skin: Secondary | ICD-10-CM

## 2023-02-19 DIAGNOSIS — R21 Rash and other nonspecific skin eruption: Secondary | ICD-10-CM | POA: Diagnosis not present

## 2023-02-19 DIAGNOSIS — C4492 Squamous cell carcinoma of skin, unspecified: Secondary | ICD-10-CM

## 2023-02-19 HISTORY — DX: Squamous cell carcinoma of skin, unspecified: C44.92

## 2023-02-19 MED ORDER — CLOBETASOL PROPIONATE 0.05 % EX OINT: TOPICAL_OINTMENT | CUTANEOUS | 1 refills | Status: AC

## 2023-02-19 NOTE — Patient Instructions (Addendum)
 Cryotherapy Aftercare  Wash gently with soap and water everyday.   Apply Vaseline and Band-Aid daily until healed.       Wound Care Instructions  Cleanse wound gently with soap and water once a day then pat dry with clean gauze. Apply a thin coat of Petrolatum (petroleum jelly, "Vaseline") over the wound (unless you have an allergy to this). We recommend that you use a new, sterile tube of Vaseline. Do not pick or remove scabs. Do not remove the yellow or white "healing tissue" from the base of the wound.  Cover the wound with fresh, clean, nonstick gauze and secure with paper tape. You may use Band-Aids in place of gauze and tape if the wound is small enough, but would recommend trimming much of the tape off as there is often too much. Sometimes Band-Aids can irritate the skin.  You should call the office for your biopsy report after 1 week if you have not already been contacted.  If you experience any problems, such as abnormal amounts of bleeding, swelling, significant bruising, significant pain, or evidence of infection, please call the office immediately.  FOR ADULT SURGERY PATIENTS: If you need something for pain relief you may take 1 extra strength Tylenol (acetaminophen) AND 2 Ibuprofen (200mg  each) together every 4 hours as needed for pain. (do not take these if you are allergic to them or if you have a reason you should not take them.) Typically, you may only need pain medication for 1 to 3 days.          Due to recent changes in healthcare laws, you may see results of your pathology and/or laboratory studies on MyChart before the doctors have had a chance to review them. We understand that in some cases there may be results that are confusing or concerning to you. Please understand that not all results are received at the same time and often the doctors may need to interpret multiple results in order to provide you with the best plan of care or course of treatment. Therefore,  we ask that you please give [place] 2 business days to thoroughly review all your results before contacting the office for clarification. Should we see a critical lab result, you will be contacted sooner.   If You Need Anything After Your Visit  If you have any questions or concerns for your doctor, please call our main line at [phone] and press option 4 to reach your doctor's medical assistant. If no one answers, please leave a voicemail as directed and we will return your call as soon as possible. Messages left after 4 pm will be answered the following business day.   You may also send [place] a message via MyChart. We typically respond to MyChart messages within 1-2 business days.  For prescription refills, please ask your pharmacy to contact our office. Our fax number is [phone].  If you have an urgent issue when the clinic is closed that cannot wait until the next business day, you can page your doctor at the number below.    Please note that while we do our best to be available for urgent issues outside of office hours, we are not available 24/7.   If you have an urgent issue and are unable to reach [place], you may choose to seek medical care at your doctor's office, retail clinic, urgent care center, or emergency room.  If you have a medical emergency, please immediately call 911 or go to the emergency department.  Pager Numbers  - Dr. Gwen Pounds: [phone]  - Dr. Roseanne Reno: [phone]  - Dr. Katrinka Blazing: [phone]   In the event of inclement weather, please call our main line at [phone] for an update on the status of any delays or closures.  Dermatology Medication Tips: Please keep the boxes that topical medications come in in order to help keep track of the instructions about where and how to use these. Pharmacies typically print the medication instructions only on the boxes and not directly on the medication tubes.   If your medication is too expensive, please contact our  office at [phone] option 4 or send [place] a message through MyChart.   We are unable to tell what your co-pay for medications will be in advance as this is different depending on your insurance coverage. However, we may be able to find a substitute medication at lower cost or fill out paperwork to get insurance to cover a needed medication.   If a prior authorization is required to get your medication covered by your insurance company, please allow [place] 1-2 business days to complete this process.  Drug prices often vary depending on where the prescription is filled and some pharmacies may offer cheaper prices.  The website www.goodrx.com contains coupons for medications through different pharmacies. The prices here do not account for what the cost may be with help from insurance (it may be cheaper with your insurance), but the website can give you the price if you did not use any insurance.  - You can print the associated coupon and take it with your prescription to the pharmacy.  - You may also stop by our office during regular business hours and pick up a GoodRx coupon card.  - If you need your prescription sent electronically to a different pharmacy, notify our office through [facility] or by phone at [phone] option 4.     Si Usted Necesita Algo Despus de Su Visita  Tambin puede enviarnos un mensaje a travs de Clinical cytogeneticist. Por lo general respondemos a los mensajes de MyChart en el transcurso de 1 a 2 das hbiles.  Para renovar recetas, por favor pida a su farmacia que se ponga en contacto con nuestra oficina. Annie Sable de fax es [place] [phone].  Si tiene un asunto urgente cuando la clnica est cerrada y que no puede esperar hasta el siguiente da hbil, puede llamar/localizar a su doctor(a) al nmero que aparece a continuacin.   Por favor, tenga en cuenta que aunque hacemos todo lo posible para estar disponibles para asuntos urgentes fuera del horario de [place], no  estamos disponibles las 24 horas del da, los 7 [address]  [address] de la [place].   Si tiene un problema urgente y no puede comunicarse con nosotros, puede optar por buscar atencin mdica  en el consultorio de su doctor(a), en una clnica privada, en un centro de atencin urgente o en una sala de emergencias.  Si tiene Engineer, drilling, por favor llame inmediatamente al 911 o vaya a la sala de emergencias.  Nmeros de bper  - Dr. Gwen Pounds: [phone]  - Dra. Roseanne Reno: [phone]  - Dr. Katrinka Blazing: [phone]   En caso de inclemencias del tiempo, por favor llame a Lacy Duverney principal al [phone] para una actualizacin sobre el [place] de cualquier retraso o cierre.  Consejos para la medicacin en dermatologa: Por favor, guarde las cajas en las que vienen los medicamentos de uso tpico para ayudarle a seguir las instrucciones sobre dnde y cmo usarlos. [employer]  generalmente imprimen las instrucciones del medicamento slo en las cajas y no directamente en los tubos del [place].   Si su medicamento es muy caro, por favor, pngase en contacto con Rolm Gala llamando al [phone] y presione la opcin 4 o envenos un mensaje a travs de Clinical cytogeneticist.   No podemos decirle cul ser su copago por los medicamentos por adelantado ya que esto es diferente dependiendo de la cobertura de su seguro. Sin embargo, es posible que podamos encontrar un medicamento sustituto a Audiological scientist un formulario para que el seguro cubra el medicamento que se considera necesario.   Si se requiere una autorizacin previa para que su compaa de seguros [place] su medicamento, por favor permtanos de 1 a 2 das hbiles para completar [address]  Los precios de los medicamentos varan con frecuencia dependiendo del Environmental consultant de dnde se surte la receta y alguna farmacias pueden ofrecer precios ms baratos.  El sitio web www.goodrx.com tiene cupones para medicamentos de Health and safety inspector. Los precios  aqu no tienen en cuenta lo que podra costar con la ayuda del seguro (puede ser ms barato con su seguro), pero el sitio web puede darle el precio si no utiliz Tourist information centre manager.  - Puede imprimir el cupn correspondiente y llevarlo con su receta a la farmacia.  - Tambin puede pasar por nuestra oficina durante el horario de atencin regular y Education officer, museum una tarjeta de cupones de GoodRx.  - Si necesita que su receta se enve electrnicamente a una farmacia diferente, informe a nuestra oficina a travs de MyChart de [place] o por telfono llamando al [phone] y presione la opcin 4.

## 2023-02-19 NOTE — Progress Notes (Signed)
Follow Up Visit   Subjective  [name] is a 82 y.o. male who presents for the following: Patient c/o a extremely itchy rash on the right arm x 1 week treating with Desoximetasone ointment  and otc Sarna lotion with a poor response, itching keeping him awake at night  The patient has spots on his arms and hands to be evaluated, some may be new or changing and the patient may have concern these could be cancer.    The following portions of the chart were reviewed this encounter and updated as appropriate: medications, allergies, medical history  Review of Systems:  No other skin or systemic complaints except as noted in HPI or Assessment and Plan.  Objective  Well appearing patient in no apparent distress; mood and affect are within normal limits.  A focused examination was performed of the following areas:right hand, right arm, left hand   Relevant exam findings are noted in the Assessment and Plan.  right dorsal hand 8 mm pink keratotic  papule         right dorsal hand x 1, right dorsal wrist x 1, left dorsal hand x 5 (7) Erythematous thin papules/macules with gritty scale.     Assessment & Plan   Neoplasm of skin right dorsal hand  Skin / nail biopsy Type of biopsy: tangential   Informed consent: discussed and consent obtained   Patient was prepped and draped in usual sterile fashion: area prepped with alochol. Anesthesia: the lesion was anesthetized in a standard fashion   Anesthetic:  1% lidocaine w/ epinephrine 1-100,000 buffered w/ 8.4% NaHCO3 Instrument used: flexible razor blade   Hemostasis achieved with: pressure, aluminum chloride and electrodesiccation   Outcome: patient tolerated procedure well   Post-procedure details: wound care instructions given   Post-procedure details comment:  Ointment and small bandage  Specimen 1 - Surgical pathology Differential Diagnosis: R/O SCC  Check Margins: No  May consider Mohs surgery if pathology confirm  skin cancer   AK (actinic keratosis) (7) right dorsal hand x 1, right dorsal wrist x 1, left dorsal hand x 5  Actinic keratoses are precancerous spots that appear secondary to cumulative UV radiation exposure/sun exposure over time. They are chronic with expected duration over 1 year. A portion of actinic keratoses will progress to squamous cell carcinoma of the skin. It is not possible to reliably predict which spots will progress to skin cancer and so treatment is recommended to prevent development of skin cancer.  Recommend daily broad spectrum sunscreen SPF 30+ to sun-exposed areas, reapply every 2 hours as needed.  Recommend staying in the shade or wearing long sleeves, sun glasses (UVA+UVB protection) and wide brim hats (4-inch brim around the entire circumference of the hat). Call for new or changing lesions.   Destruction of lesion - right dorsal hand x 1, right dorsal wrist x 1, left dorsal hand x 5 (7) Complexity: simple   Destruction method: cryotherapy   Informed consent: discussed and consent obtained   Timeout:  patient name, date of birth, surgical site, and procedure verified Lesion destroyed using liquid nitrogen: Yes   Region frozen until ice ball extended beyond lesion: Yes   Outcome: patient tolerated procedure well with no complications   Post-procedure details: wound care instructions given      RASH Exam: erythematous blanchable patch with slight lichenification on right proximal medial forearm  Chronic and persistent condition with duration or expected duration over one year. Condition is symptomatic/ bothersome to patient.  Not currently at goal.   Treatment Plan: Lesion has features of LSC, but would not expect this to appear acutely. Has improved with desoximetasone, so will continue with stronger steroid until resolved. Patient will return for biopsy if not improved after clobetasol Start Clobetasol .05% ointment apply to affected arm twice a day x 1 weeks,  Avoid applying to face, groin, and axilla. Use as directed. Long-term use can cause thinning of the skin.   Topical steroids (such as triamcinolone, fluocinolone, fluocinonide, mometasone, clobetasol, halobetasol, betamethasone, hydrocortisone) can cause thinning and lightening of the skin if they are used for too long in the same area. Your physician has selected the right strength medicine for your problem and area affected on the body. Please use your medication only as directed by your physician to prevent side effects.      Return if symptoms worsen or fail to improve.  IAngelique Holm, CMA, am acting as scribe for Elie Goody, MD .   Documentation: I have reviewed the above documentation for accuracy and completeness, and I agree with the above.  Elie Goody, MD

## 2023-02-25 ENCOUNTER — Telehealth: Payer: Self-pay

## 2023-02-25 ENCOUNTER — Ambulatory Visit (INDEPENDENT_AMBULATORY_CARE_PROVIDER_SITE_OTHER): Payer: PPO

## 2023-02-25 VITALS — Ht 72.0 in | Wt 162.0 lb

## 2023-02-25 DIAGNOSIS — Z0000 Encounter for general adult medical examination without abnormal findings: Secondary | ICD-10-CM | POA: Diagnosis not present

## 2023-02-25 DIAGNOSIS — C4492 Squamous cell carcinoma of skin, unspecified: Secondary | ICD-10-CM

## 2023-02-25 NOTE — Telephone Encounter (Signed)
-----   Message from [place] sent at 02/25/2023  1:29 PM EDT ----- Diagnosis: WELL DIFFERENTIATED SQUAMOUS CELL CARCINOMA  Please call with diagnosis and determine where the patient would like to have Mohs surgery.  Explanation: This is a squamous cell skin cancer that has grown beyond the surface of the skin and is invading the second layer of the skin. It has the potential to spread beyond the skin and threaten your health, so I recommend treating it.  Treatment: Given the location and type of skin cancer, I recommend Mohs surgery. Mohs surgery involves cutting out the skin cancer and then checking under the microscope to ensure the whole skin cancer was removed. If any skin cancer remains, the surgeon will cut out more until it is fully removed. The cure rate is about 98-99%. Once the Mohs surgeon confirms the skin cancer is out, they will discuss the options to repair or heal the area. You must take it easy for about two weeks after surgery (no lifting over 10-15 lbs, avoid activity to get your heart rate and blood pressure up). It is done at another office outside of [place] ([place], [place], or No Name).  If the patient asks for my recommendation for Mohs surgeon, I recommend Dr Caprice Beaver or Dr Coralie Carpen at [facility] if the patient is able to make the trip.

## 2023-02-25 NOTE — Patient Instructions (Signed)
[name] , Thank you for taking time to come for your Medicare Wellness Visit. I appreciate your ongoing commitment to your health goals. Please review the following plan we discussed and let me know if I can assist you in the future.   Referrals/Orders/Follow-Ups/Clinician Recommendations: none  This is a list of the screening recommended for you and due dates:  Health Maintenance  Topic Date Due   Flu Shot  01/16/2023   COVID-19 Vaccine (5 - 2023-24 season) 02/16/2023   Medicare Annual Wellness Visit  02/25/2024   DTaP/Tdap/Td vaccine (7 - Td or Tdap) 03/15/2031   Pneumonia Vaccine  Completed   Zoster (Shingles) Vaccine  Completed   HPV Vaccine  Aged Out    Advanced directives: (Copy Requested) Please bring a copy of your health care power of attorney and living will to the office to be added to your chart at your convenience.  Next Medicare Annual Wellness Visit scheduled for next year: Yes 03/01/2024 @ 11:15am telephone

## 2023-02-25 NOTE — Telephone Encounter (Signed)
Called patient. N/A. LMOM to C/B for pathology results and treatment plan.

## 2023-02-25 NOTE — Progress Notes (Signed)
Subjective:   [name] is a 82 y.o. male who presents for Medicare Annual/Subsequent preventive examination.  Visit Complete: Virtual  I connected with  [name] on 02/25/23 by a audio enabled telemedicine application and verified that I am speaking with the correct person using two identifiers.  Patient Location: Home  Provider Location: Office/Clinic  I discussed the limitations of evaluation and management by telemedicine. The patient expressed understanding and agreed to proceed.  Vital Signs: Unable to obtain new vitals due to this being a telehealth visit.  Patient Medicare AWV questionnaire was completed by the patient on 02/24/23; I have confirmed that all information answered by patient is correct and no changes since this date.  Review of Systems    Cardiac Risk Factors include: advanced age (>66men, >57 women);hypertension;male gender    Objective:    Today's Vitals   02/25/23 1126  Weight: 162 lb (73.5 kg)  Height: 6' (1.829 m)   Body mass index is 21.97 kg/m.     02/25/2023   11:32 AM 03/06/2022    2:04 PM 10/19/2021    6:26 AM 10/12/2021   12:02 PM 03/04/2021    8:14 AM 09/29/2020    4:16 PM 09/28/2020   10:28 PM  Advanced Directives  Does Patient Have a Medical Advance Directive? Yes No Yes Yes Yes Yes No  Type of Estate agent of [place];Living will  Healthcare Power of [place];Living will  Healthcare Power of [employer] of [place];Living will   Does patient want to make changes to medical advance directive?   No - Patient declined  No - Patient declined    Copy of Healthcare Power of Attorney in Chart?   Yes - validated most recent copy scanned in chart (See row information)  No - copy requested    Would patient like information on creating a medical advance directive?  No - Patient declined         Current Medications (verified) Outpatient Encounter Medications as of 02/25/2023  Medication Sig   albuterol  (PROVENTIL HFA;VENTOLIN HFA) 108 (90 Base) MCG/ACT inhaler Inhale 2 puffs into the lungs every 6 (six) hours as needed for wheezing or shortness of breath.   albuterol (PROVENTIL) (2.5 MG/3ML) 0.083% nebulizer solution Take 2.5 mg by nebulization in the morning and at bedtime.   brimonidine (ALPHAGAN) 0.15 % ophthalmic solution Place 1 drop into both eyes 2 (two) times daily.   clobetasol ointment (TEMOVATE) 0.05 % Apply to affected arm twice a day x 7 days, Avoid applying to face, groin, and axilla. Use as directed. Long-term use can cause thinning of the skin.   Desoximetasone (TOPICORT) 0.25 % ointment APPLY TO ECZEMA OF UPPER ARM TWICE DAILYAS DIRECTED.   diltiazem (CARDIZEM CD) 180 MG 24 hr capsule TAKE 1 CAPSULE BY MOUTH ONCE DAILY   dorzolamide (TRUSOPT) 2 % ophthalmic solution Place 1 drop into both eyes 2 (two) times daily.    finasteride (PROSCAR) 5 MG tablet TAKE ONE TABLET BY MOUTH EVERY DAY   fluticasone-salmeterol (ADVAIR) 500-50 MCG/ACT AEPB Inhale 1 puff into the lungs in the morning and at bedtime.   ketoconazole (NIZORAL) 2 % shampoo    latanoprost (XALATAN) 0.005 % ophthalmic solution Place 1 drop into both eyes at bedtime.    Melatonin 5 MG TABS Take 5 mg by mouth at bedtime.   montelukast (SINGULAIR) 10 MG tablet Take 10 mg by mouth at bedtime.   omeprazole (PRILOSEC) 20 MG capsule TAKE 1 CAPSULE  BY MOUTH EVERY DAY   Sodium Chloride, Inhalant, 7 % NEBU Take 1 vial by nebulization 2 (two) times daily.   tiotropium (SPIRIVA) 18 MCG inhalation capsule Place 18 mcg into inhaler and inhale every evening.   Vibegron (GEMTESA) 75 MG TABS Take 1 tablet (75 mg total) by mouth daily.   XARELTO 10 MG TABS tablet Take 1 tablet (10 mg total) by mouth daily.   No facility-administered encounter medications on file as of 02/25/2023.    Allergies (verified) Timolol maleate [timolol maleate], Atorvastatin, Bactrim [sulfamethoxazole-trimethoprim], Lisinopril, and Symbicort  [budesonide-formoterol fumarate]   History: Past Medical History:  Diagnosis Date   AB (asthmatic bronchitis)    Abnormal liver enzymes    ABPA (allergic bronchopulmonary aspergillosis) (HCC)    Allergic rhinitis    Aortic atherosclerosis (HCC)    Asthma    CAP (community acquired pneumonia)    Cellulitis    COPD (chronic obstructive pulmonary disease) (HCC)    Coronary artery disease    a.) LHC 08/19/2016: 50% pRCA; intervention deferred opting for med. mgmt.   Diastolic dysfunction    a.) TTE 08/18/2016: EF 50%; inf HK; mild BAE, mild RV enlargement; G1DD   Diverticulitis    DVT (deep venous thrombosis) (HCC) 08/20/2016   a.) Doppler 08/20/2016: chronic appearing; occlusive DVT LEFT popliteal vein, with additional nearly occlusive DVT in the overlying distal SFV.   Dyspnea    GERD (gastroesophageal reflux disease)    Glaucoma    Headache    History of sepsis    HLD (hyperlipidemia)    Hx MRSA infection    Hx of basal cell carcinoma 02/26/2018   L lat mid back   Hypertension    Resolved - no meds   Insomnia    a.) takes malatonin   NSTEMI (non-ST elevated myocardial infarction) (HCC) 08/18/2016   a.) LHC 08/19/2016: 50% pRCA; intervention deferred opting for med. mgmt.   Osteoarthritis    Paroxysmal tachycardia (HCC)    PVC (premature ventricular contraction)    Past Surgical History:  Procedure Laterality Date   APPENDECTOMY     BRONCHOSCOPY     CATARACT EXTRACTION W/PHACO Left 09/01/2019   Procedure: CATARACT EXTRACTION PHACO AND INTRAOCULAR LENS PLACEMENT (IOC) LEFT 11.72 01:12.2 16.2%;  Surgeon: Lockie Mola, MD;  [facility];  Service: Ophthalmology;  Laterality: Left;   CATARACT EXTRACTION W/PHACO Right 09/22/2019   Procedure: CATARACT EXTRACTION PHACO AND INTRAOCULAR LENS PLACEMENT (IOC) RIGHT;  Surgeon: Lockie Mola, MD;  Location: [facility];  Service: Ophthalmology;  Laterality: Right;  6.88 103.4 10.9%   GANGLION  CYST EXCISION Left 10/19/2021   Procedure: REMOVAL GANGLION OF WRIST;  Surgeon: Deeann Saint, MD;  Location: [facility] ORS;  Service: Orthopedics;  Laterality: Left;   LEFT HEART CATH AND CORONARY ANGIOGRAPHY N/A 08/19/2016   Procedure: Left Heart Cath and Coronary Angiography;  Surgeon: Laurier Nancy, MD;  Location: [facility] INVASIVE CV LAB;  Service: Cardiovascular;  Laterality: N/A;   NASAL SINUS SURGERY  09/27/2014   Family History  Problem Relation Age of Onset   Breast cancer Sister    Heart failure Mother    Pancreatic cancer Sister    Alzheimer's disease Father    Brain cancer Daughter    Social History   Socioeconomic History   Marital status: Married    Spouse name: Rhunette Croft   Number of children: 2   Years of education: College   Highest education level: Bachelor's degree (e.g., BA, AB, BS)  Occupational History  Occupation: Retired    Associate Professor: RETIRED    CommentEducation officer, environmental  Tobacco Use   Smoking status: Former    Current packs/day: 0.00    Average packs/day: 0.5 packs/day for 3.0 years (1.5 ttl pk-yrs)    Types: Cigarettes    Start date: 06/18/1955    Quit date: 06/17/1958    Years since quitting: 64.7    Passive exposure: Past   Smokeless tobacco: Never   Tobacco comments:    smoking while in college  Vaping Use   Vaping status: Never Used  Substance and Sexual Activity   Alcohol use: Yes    Alcohol/week: 3.0 - 4.0 standard drinks of alcohol    Types: 3 - 4 Glasses of wine per week    Comment: occ wine   Drug use: No   Sexual activity: Yes  Other Topics Concern   Not on file  Social History Narrative   He is a married father of 2 daughters.   He smoked casually while in college for about 3 years less than a pack a day.   He drinks 3-4 glass of wine at night. He also has about 2 servings of coffee a day   He is a very active gentleman with Silver Sneakers exercise program at least 3-4 days a week.   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    In addition to this standing his exercise, he does lots of gardening and uses a chain saw to cut wood.            Social Determinants of Health   Financial Resource Strain: Low Risk  (02/23/2023)   Overall Financial Resource Strain (CARDIA)    Difficulty of Paying Living Expenses: Not hard at all  Food Insecurity: No Food Insecurity (02/23/2023)   Hunger Vital Sign    Worried About Running Out of Food in the Last Year: Never true    Ran Out of Food in the Last Year: Never true  Transportation Needs: No Transportation Needs (02/23/2023)   PRAPARE - Administrator, Civil Service (Medical): No    Lack of Transportation (Non-Medical): No  Physical Activity: Sufficiently Active (02/23/2023)   Exercise Vital Sign    Days of Exercise per Week: 3 days    Minutes of Exercise per Session: 60 min  Stress: No Stress Concern Present (02/23/2023)   [employer] of Occupational Health - Occupational Stress Questionnaire    Feeling of Stress : Not at all  Social Connections: Socially Integrated (02/23/2023)   Social Connection and Isolation Panel [NHANES]    Frequency of Communication with Friends and Family: More than three times a week    Frequency of Social Gatherings with Friends and Family: Twice a week    Attends Religious Services: More than 4 times per year    Active Member of [employer] or Organizations: Yes    Attends Banker Meetings: Never    Marital Status: Married    Tobacco Counseling Counseling given: Not Answered Tobacco comments: smoking while in college   Clinical Intake:  Pre-visit preparation completed: Yes  Pain : No/denies pain   BMI - recorded: 21.97 Nutritional Status: BMI of 19-24  Normal Nutritional Risks: None Diabetes: No  How often do you need to have someone help you when you read instructions, pamphlets, or other written materials from your doctor or pharmacy?: 1 - Never  Interpreter Needed?: No  Comments: lives with  wife Information entered by ::  B.Torianna Junio,LPN   Activities of Daily Living    02/23/2023    3:57 PM 03/06/2022    2:05 PM  In your present state of health, do you have any difficulty performing the following activities:  Hearing? 0 0  Vision? 0 0  Difficulty concentrating or making decisions? 0 0  Walking or climbing stairs? 0 0  Dressing or bathing? 0 0  Doing errands, shopping? 0 0  Preparing Food and eating ? N N  Using the Toilet? N N  In the past six months, have you accidently leaked urine? N N  Do you have problems with loss of bowel control? N N  Managing your Medications? N N  Managing your Finances? N N  Housekeeping or managing your Housekeeping? N N    Patient Care Team: Erasmo Downer, MD as PCP - General (Family Medicine) Lockie Mola, MD as Referring Physician (Ophthalmology) Kathlen Mody, MD as Referring Physician (Internal Medicine) Aurelio Brash, MD as Referring Physician (Otolaryngology) Dalia Heading, MD as Consulting Physician (Cardiology) Elinor Parkinson, [place] as Consulting Physician (Podiatry) Alfredo Martinez, MD as Consulting Physician (Urology)  Indicate any recent Medical Services you may have received from other than [facility] providers in the past year (date may be approximate).     Assessment:   This is a routine wellness examination for Julis.  Hearing/Vision screen Hearing Screening - Comments:: Adequate hearing Vision Screening - Comments:: Adequate vision:readers only   Goals Addressed             This Visit's Progress    DIET - EAT MORE FRUITS AND VEGETABLES   Not on track    Some improvement       Depression Screen    02/25/2023   11:30 AM 03/06/2022    2:03 PM 11/07/2021    3:40 PM 07/18/2021    9:10 AM 03/04/2021    8:10 AM 01/02/2021   10:29 AM 05/02/2020    8:09 AM  PHQ 2/9 Scores  PHQ - 2 Score 0 0 0 0 0 0 0  PHQ- 9 Score  0  0   0  Exception Documentation     Patient refusal      Fall Risk     02/23/2023    3:57 PM 03/06/2022    2:05 PM 11/07/2021    3:40 PM 07/18/2021    9:09 AM 03/04/2021    8:16 AM  Fall Risk   Falls in the past year? 0 0 0 0 0  Number falls in past yr: 0 0 0 0 0  Injury with Fall?  0 0 0 0  Risk for fall due to : No Fall Risks No Fall Risks  No Fall Risks No Fall Risks  Follow up Falls prevention discussed;Education provided Falls prevention discussed;Falls evaluation completed       MEDICARE RISK AT HOME: Medicare Risk at Home Any stairs in or around the home?: Yes If so, are there any without handrails?: Yes Home free of loose throw rugs in walkways, pet beds, electrical cords, etc?: Yes Adequate lighting in your home to reduce risk of falls?: Yes Life alert?: No Use of a cane, walker or w/c?: No Grab bars in the bathroom?: Yes Shower chair or bench in shower?: Yes Elevated toilet seat or a handicapped toilet?: No  TIMED UP AND GO:  Was the test performed?  No    Cognitive Function:        02/25/2023   11:33 AM 03/06/2022  2:06 PM 03/04/2021    8:16 AM 01/02/2021   10:30 AM 12/22/2019    9:28 AM  6CIT Screen  What Year? 0 points 0 points 0 points 0 points 0 points  What month? 0 points 0 points 0 points 0 points 0 points  What time? 0 points 0 points 0 points 0 points 0 points  Count back from 20 0 points 0 points 0 points 0 points 0 points  Months in reverse 0 points 0 points 0 points 0 points 0 points  Repeat phrase 0 points 0 points 0 points 0 points 0 points  Total Score 0 points 0 points 0 points 0 points 0 points    Immunizations Immunization History  Administered Date(s) Administered   Fluad Quad(high Dose 65+) 02/08/2019, 03/02/2020, 03/12/2022   Influenza Split 03/02/2012, 03/17/2013, 05/07/2014, 02/23/2018   Influenza Whole 03/24/2007, 03/15/2009, 02/15/2010   Influenza, High Dose Seasonal PF 05/04/2015, 05/04/2015, 03/21/2016, 03/21/2016, 03/24/2017, 03/24/2017   Influenza,inj,Quad PF,6+ Mos 03/14/2021   Influenza-Unspecified  02/23/2018   PFIZER(Purple Top)SARS-COV-2 Vaccination 06/28/2019, 07/19/2019, 03/20/2020   Pfizer Covid-19 Vaccine Bivalent Booster 3yrs & up 03/16/2021   Pneumococcal Conjugate-13 11/17/2013   Pneumococcal Polysaccharide-23 11/19/2004, 03/19/2007, 03/17/2009, 02/09/2020   Td 05/16/1997, 05/14/2007, 09/11/2010   Td (Adult), 2 Lf Tetanus Toxid, Preservative Free 05/16/1997, 05/14/2007, 09/11/2010   Tdap 03/14/2021   Zoster Recombinant(Shingrix) 02/23/2018, 04/28/2018   Zoster, Live 01/02/2011    TDAP status: Up to date  Flu Vaccine status: Up to date  Pneumococcal vaccine status: Up to date  Covid-19 vaccine status: Completed vaccines  Qualifies for Shingles Vaccine? Yes   Zostavax completed Yes   Shingrix Completed?: Yes  Screening Tests Health Maintenance  Topic Date Due   INFLUENZA VACCINE  01/16/2023   COVID-19 Vaccine (5 - 2023-24 season) 02/16/2023   Medicare Annual Wellness (AWV)  02/25/2024   DTaP/Tdap/Td (7 - Td or Tdap) 03/15/2031   Pneumonia Vaccine 79+ Years old  Completed   Zoster Vaccines- Shingrix  Completed   HPV VACCINES  Aged Out    Health Maintenance  Health Maintenance Due  Topic Date Due   INFLUENZA VACCINE  01/16/2023   COVID-19 Vaccine (5 - 2023-24 season) 02/16/2023    Colorectal cancer screening: No longer required.   Lung Cancer Screening: (Low Dose CT Chest recommended if Age 102-80 years, 20 pack-year currently smoking OR have quit w/in 15years.) does not qualify.   Lung Cancer Screening Referral: no  Additional Screening:  Hepatitis C Screening: does not qualify; Completed yes  Vision Screening: Recommended annual ophthalmology exams for early detection of glaucoma and other disorders of the eye. Is the patient up to date with their annual eye exam?  Yes  Who is the provider or what is the name of the office in which the patient attends annual eye exams? Dr Inez Pilgrim If pt is not established with a provider, would they like to be  referred to a provider to establish care? No .   Dental Screening: Recommended annual dental exams for proper oral hygiene  Diabetic Foot Exam: n/a  Community Resource Referral / Chronic Care Management: CRR required this visit?  No   CCM required this visit?  No    Plan:     I have personally reviewed and noted the following in the patient's chart:   Medical and social history Use of alcohol, tobacco or illicit drugs  Current medications and supplements including opioid prescriptions. Patient is not currently taking opioid prescriptions. Functional ability and status Nutritional status  Physical activity Advanced directives List of other physicians Hospitalizations, surgeries, and ER visits in previous 12 months Vitals Screenings to include cognitive, depression, and falls Referrals and appointments  In addition, I have reviewed and discussed with patient certain preventive protocols, quality metrics, and best practice recommendations. A written personalized care plan for preventive services as well as general preventive health recommendations were provided to patient.    Sue Lush, LPN   [id number]   After Visit Summary: (MyChart) Due to this being a telephonic visit, the after visit summary with patients personalized plan was offered to patient via MyChart   Nurse Notes: The patient states he is doing well and has no concerns or questions at this time.

## 2023-02-26 NOTE — Telephone Encounter (Signed)
Advised patient of information per Dr. Katrinka Blazing and sent referral to [facility] in [place] per pts request. aw

## 2023-02-26 NOTE — Addendum Note (Signed)
Addended by: Dorathy Daft R on: 02/26/2023 02:31 PM   Modules accepted: Orders

## 2023-02-28 ENCOUNTER — Other Ambulatory Visit: Payer: Self-pay | Admitting: Family Medicine

## 2023-03-03 NOTE — Telephone Encounter (Signed)
Courtesy refill. Patient needs to keep upcoming appointment for further refills. Requested Prescriptions  Pending Prescriptions Disp Refills   diltiazem (CARDIZEM CD) 180 MG 24 hr capsule [Pharmacy Med Name: DILTIAZEM HCL ER COATED BEADS 180 M] 30 capsule 0    Sig: TAKE 1 CAPSULE BY MOUTH ONCE DAILY     Cardiovascular: Calcium Channel Blockers 3 Passed - 02/28/2023  1:52 PM      Passed - ALT in normal range and within 360 days    ALT  Date Value Ref Range Status  03/12/2022 22 0 - 44 IU/L Final   SGPT (ALT)  Date Value Ref Range Status  04/07/2014 24 U/L Final    Comment:    14-63 NOTE: New Reference Range 01/04/14          Passed - AST in normal range and within 360 days    AST  Date Value Ref Range Status  03/12/2022 27 0 - 40 IU/L Final   SGOT(AST)  Date Value Ref Range Status  04/07/2014 15 15 - 37 Unit/L Final         Passed - Cr in normal range and within 360 days    Creatinine  Date Value Ref Range Status  04/08/2014 1.23 0.60 - 1.30 mg/dL Final   Creatinine, Ser  Date Value Ref Range Status  03/12/2022 1.09 0.76 - 1.27 mg/dL Final         Passed - Last BP in normal range    BP Readings from Last 1 Encounters:  01/29/23 131/81         Passed - Last Heart Rate in normal range    Pulse Readings from Last 1 Encounters:  01/29/23 75         Passed - Valid encounter within last 6 months    Recent Outpatient Visits           11 months ago Annual physical exam   [place] [facility] Bosie Clos, MD   1 year ago Diverticulitis   [facility]ie Clos, [facility]   1 year ago Cellulitis of lower extremity, unspecified laterality   [place] [facility] Bosie Clos, MD   1 year ago Cellulitis of lower extremity, unspecified laterality   [place] [facility] [place], [place], PA-C   1 year ago Cellulitis of lower extremity, unspecified laterality   [place]  [facility] [place], [place], [place]       Future Appointments     Next 5 Appointments             In 2 days McGowan, Elana Alm [facility] [place]   In 1 week McGowan, Elana Alm [facility] [place]   In 2 weeks Bacigalupo, Marzella Schlein, MD [facility]   In 2 weeks McGowan, Elana Alm [facility] [place]   In 3 weeks McGowan, [place], PA-C [facility] [place]         Displaying the next 5 appointments. This patient has additional appointments scheduled.

## 2023-03-04 NOTE — Progress Notes (Unsigned)
PTNS  Session # Maintenance PTNS   Health & Social Factors: No change Caffeine:  2 cups daily Alcohol: 2 to 3 glasses of wine weekly Daytime voids #per day: 5 Night-time voids #per night: 2 Urgency: mild Incontinence Episodes #per day: 0 Ankle used: left Treatment Setting: 2 Feeling/ Response: both Comments: Patient tolerated procedure well.  Performed By: Michiel Cowboy, PA-C   Follow Up: 1 month for maintenance PTNS

## 2023-03-05 ENCOUNTER — Encounter: Payer: Self-pay | Admitting: Urology

## 2023-03-05 ENCOUNTER — Telehealth: Payer: Self-pay

## 2023-03-05 ENCOUNTER — Ambulatory Visit: Payer: PPO | Admitting: Urology

## 2023-03-05 VITALS — BP 124/71 | HR 66 | Ht 72.0 in | Wt 162.0 lb

## 2023-03-05 DIAGNOSIS — R351 Nocturia: Secondary | ICD-10-CM

## 2023-03-05 DIAGNOSIS — R350 Frequency of micturition: Secondary | ICD-10-CM

## 2023-03-05 NOTE — Patient Instructions (Signed)

## 2023-03-05 NOTE — Telephone Encounter (Signed)
Pt called LM on vm  Pt states his PTNS are monthly but was scheduled for weekly. Pt is requesting a call back. He feels this is an error?

## 2023-03-06 NOTE — Telephone Encounter (Signed)
Pt is asking for gemtesa samples. States he ran out last week and would like more. Please advise.  Pt rescheduled for one month PTNS.

## 2023-03-07 NOTE — Telephone Encounter (Signed)
Spoke with patient and advised results Samples left at front office

## 2023-03-08 ENCOUNTER — Other Ambulatory Visit: Payer: Self-pay | Admitting: Family Medicine

## 2023-03-11 NOTE — Telephone Encounter (Signed)
Requested Prescriptions  Pending Prescriptions Disp Refills   omeprazole (PRILOSEC) 20 MG capsule [Pharmacy Med Name: OMEPRAZOLE DR 20 MG CAP] 90 capsule 0    Sig: TAKE 1 CAPSULE BY MOUTH EVERY DAY     Gastroenterology: Proton Pump Inhibitors Passed - 03/08/2023 10:20 AM      Passed - Valid encounter within last 12 months    Recent Outpatient Visits           12 months ago Annual physical exam   [place] [facility] Bosie Clos, MD   1 year ago Diverticulitis   [facility] Bosie Clos, MD   1 year ago Cellulitis of lower extremity, unspecified laterality   [place] [facility] Bosie Clos, MD   1 year ago Cellulitis of lower extremity, unspecified laterality   [place] [facility] [place], [place], PA-C   1 year ago Cellulitis of lower extremity, unspecified laterality    [place] [facility] [place] [facility] [place], [place], PA-C       Future Appointments             In 1 week Bacigalupo, Marzella Schlein, MD [facility]   In 3 weeks McGowan, Elana Alm [facility]rology [place]   In 4 weeks McGowan, Elana Alm [facility]rology [place]   In 1 month Deirdre Evener, MD [facility]

## 2023-03-12 ENCOUNTER — Ambulatory Visit: Payer: PPO | Admitting: Urology

## 2023-03-13 DIAGNOSIS — C44622 Squamous cell carcinoma of skin of right upper limb, including shoulder: Secondary | ICD-10-CM | POA: Diagnosis not present

## 2023-03-14 ENCOUNTER — Encounter: Payer: PPO | Admitting: Family Medicine

## 2023-03-18 ENCOUNTER — Encounter: Payer: Self-pay | Admitting: Family Medicine

## 2023-03-18 ENCOUNTER — Ambulatory Visit: Payer: PPO | Admitting: Family Medicine

## 2023-03-18 VITALS — BP 127/79 | HR 77 | Temp 98.5°F | Ht 72.0 in | Wt 159.0 lb

## 2023-03-18 DIAGNOSIS — J4489 Other specified chronic obstructive pulmonary disease: Secondary | ICD-10-CM

## 2023-03-18 DIAGNOSIS — I2510 Atherosclerotic heart disease of native coronary artery without angina pectoris: Secondary | ICD-10-CM | POA: Diagnosis not present

## 2023-03-18 DIAGNOSIS — Z7901 Long term (current) use of anticoagulants: Secondary | ICD-10-CM | POA: Insufficient documentation

## 2023-03-18 DIAGNOSIS — N400 Enlarged prostate without lower urinary tract symptoms: Secondary | ICD-10-CM

## 2023-03-18 DIAGNOSIS — Z86718 Personal history of other venous thrombosis and embolism: Secondary | ICD-10-CM

## 2023-03-18 DIAGNOSIS — I5032 Chronic diastolic (congestive) heart failure: Secondary | ICD-10-CM | POA: Diagnosis not present

## 2023-03-18 DIAGNOSIS — I10 Essential (primary) hypertension: Secondary | ICD-10-CM

## 2023-03-18 DIAGNOSIS — Z0000 Encounter for general adult medical examination without abnormal findings: Secondary | ICD-10-CM

## 2023-03-18 DIAGNOSIS — G4700 Insomnia, unspecified: Secondary | ICD-10-CM | POA: Diagnosis not present

## 2023-03-18 DIAGNOSIS — I479 Paroxysmal tachycardia, unspecified: Secondary | ICD-10-CM | POA: Diagnosis not present

## 2023-03-18 DIAGNOSIS — J449 Chronic obstructive pulmonary disease, unspecified: Secondary | ICD-10-CM | POA: Diagnosis not present

## 2023-03-18 DIAGNOSIS — R739 Hyperglycemia, unspecified: Secondary | ICD-10-CM

## 2023-03-18 DIAGNOSIS — D692 Other nonthrombocytopenic purpura: Secondary | ICD-10-CM | POA: Diagnosis not present

## 2023-03-18 DIAGNOSIS — H4010X0 Unspecified open-angle glaucoma, stage unspecified: Secondary | ICD-10-CM | POA: Insufficient documentation

## 2023-03-18 DIAGNOSIS — B4481 Allergic bronchopulmonary aspergillosis: Secondary | ICD-10-CM | POA: Diagnosis not present

## 2023-03-18 DIAGNOSIS — Z0001 Encounter for general adult medical examination with abnormal findings: Secondary | ICD-10-CM

## 2023-03-18 DIAGNOSIS — I509 Heart failure, unspecified: Secondary | ICD-10-CM | POA: Insufficient documentation

## 2023-03-18 MED ORDER — TRAZODONE HCL 50 MG PO TABS
25.0000 mg | ORAL_TABLET | Freq: Every evening | ORAL | 3 refills | Status: DC | PRN
Start: 2023-03-18 — End: 2023-10-03

## 2023-03-18 NOTE — Assessment & Plan Note (Signed)
Isolated episode  Followed by cardiology

## 2023-03-18 NOTE — Assessment & Plan Note (Addendum)
Chronic  Echo in 2018 - EF 53%, borderline systolic dysfunction  Followed by cardiology

## 2023-03-18 NOTE — Assessment & Plan Note (Signed)
Chronic and stable  Continue current management  Followed by pulmonology

## 2023-03-18 NOTE — Assessment & Plan Note (Signed)
Recheck [id number]

## 2023-03-18 NOTE — Progress Notes (Signed)
Complete physical exam  Patient: [name]   DOB: March 13, 1941   82 y.o. Male  MRN: [id number]  Subjective:    Chief Complaint  Patient presents with   Annual Exam    Patient was seen for his AWV by the Nurse Health Advisor on 02/25/23    JERRYD [name] is a 82 y.o. male who presents today for a complete physical exam. He reports consuming a general diet. Gym/ health club routine includes treadmill. He generally feels well. He reports sleeping poorly. He does have additional problems to discuss today.    Most recent fall risk assessment:    02/23/2023    3:57 PM  Fall Risk   Falls in the past year? 0  Number falls in past yr: 0  Risk for fall due to : No Fall Risks  Follow up Falls prevention discussed;Education provided     Most recent depression screenings:    02/25/2023   11:30 AM 03/06/2022    2:03 PM  PHQ 2/9 Scores  PHQ - 2 Score 0 0  PHQ- 9 Score  0    Patient Active Problem List   Diagnosis Date Noted   Other nonthrombocytopenic purpura (HCC) 03/18/2023   CHF (congestive heart failure) (HCC) 03/18/2023   Open-angle glaucoma of both eyes 03/18/2023   Ganglion cyst of wrist, left 08/08/2021   BPH associated with nocturia 01/15/2018   SOB (shortness of breath) 08/13/2017   Coronary artery disease involving native coronary artery of native heart 03/12/2017   Diverticulitis 11/05/2016   History of DVT (deep vein thrombosis) 05/15/2015   Basal cell carcinoma 10/19/2014   Personal history of methicillin resistant Staphylococcus aureus 10/19/2014   Pain in shoulder 10/19/2014   Back strain 10/19/2014   Basal cell carcinoma of skin 10/19/2014   Allergic bronchopulmonary aspergillosis (HCC) 07/30/2014   Paroxysmal tachycardia (HCC) 06/23/2013   Asymptomatic PVCs 06/23/2013   Beat, premature ventricular 06/23/2013   Ventricular premature beats 06/23/2013   Allergic rhinitis 01/17/2009   CAFL (chronic airflow limitation) (HCC) 11/04/2008   Chronic obstructive asthma  (HCC) 11/04/2008   Essential hypertension 02/09/2008   Arthropathia 08/28/2007   Disorder of mitral valve 07/08/2005   Benign prostatic hyperplasia without urinary obstruction 05/16/1997   Low back pain 05/16/1997   Degenerative arthritis of hip 03/18/1995   Patient Care Team: Erasmo Downer, MD as PCP - General (Family Medicine) Lockie Mola, MD as Referring Physician (Ophthalmology) Kathlen Mody, MD as Referring Physician (Internal Medicine) Aurelio Brash, MD as Referring Physician (Otolaryngology) Dalia Heading, MD as Consulting Physician (Cardiology) Elinor Parkinson, [place] as Consulting Physician (Podiatry) Alfredo Martinez, MD as Consulting Physician (Urology)   Outpatient Medications Prior to Visit  Medication Sig   albuterol (PROVENTIL HFA;VENTOLIN HFA) 108 (90 Base) MCG/ACT inhaler Inhale 2 puffs into the lungs every 6 (six) hours as needed for wheezing or shortness of breath.   albuterol (PROVENTIL) (2.5 MG/3ML) 0.083% nebulizer solution Take 2.5 mg by nebulization in the morning and at bedtime.   brimonidine (ALPHAGAN) 0.15 % ophthalmic solution Place 1 drop into both eyes 2 (two) times daily.   clobetasol ointment (TEMOVATE) 0.05 % Apply to affected arm twice a day x 7 days, Avoid applying to face, groin, and axilla. Use as directed. Long-term use can cause thinning of the skin.   Desoximetasone (TOPICORT) 0.25 % ointment APPLY TO ECZEMA OF UPPER ARM TWICE DAILYAS DIRECTED.   diltiazem (CARDIZEM CD) 180 MG 24 hr capsule TAKE 1 CAPSULE  BY MOUTH ONCE DAILY   dorzolamide (TRUSOPT) 2 % ophthalmic solution Place 1 drop into both eyes 2 (two) times daily.    finasteride (PROSCAR) 5 MG tablet TAKE ONE TABLET BY MOUTH EVERY DAY   fluticasone-salmeterol (ADVAIR) 500-50 MCG/ACT AEPB Inhale 1 puff into the lungs in the morning and at bedtime.   ketoconazole (NIZORAL) 2 % shampoo    latanoprost (XALATAN) 0.005 % ophthalmic solution Place 1 drop into both eyes at  bedtime.    Melatonin 5 MG TABS Take 5 mg by mouth at bedtime.   montelukast (SINGULAIR) 10 MG tablet Take 10 mg by mouth at bedtime.   omeprazole (PRILOSEC) 20 MG capsule TAKE 1 CAPSULE BY MOUTH EVERY DAY   Sodium Chloride, Inhalant, 7 % NEBU Take 1 vial by nebulization 2 (two) times daily.   tiotropium (SPIRIVA) 18 MCG inhalation capsule Place 18 mcg into inhaler and inhale every evening.   Vibegron (GEMTESA) 75 MG TABS Take 1 tablet (75 mg total) by mouth daily.   XARELTO 10 MG TABS tablet Take 1 tablet (10 mg total) by mouth daily.   No facility-administered medications prior to visit.   Review of Systems  Psychiatric/Behavioral:  The patient has insomnia.   All other systems reviewed and are negative.     Objective:    BP 127/79 (BP Location: Left Arm, Patient Position: Sitting, Cuff Size: Normal)   Pulse 77   Temp 98.5 F (36.9 C) (Oral)   Ht 6' (1.829 m)   Wt 159 lb (72.1 kg)   SpO2 95%   BMI 21.56 kg/m  BP Readings from Last 3 Encounters:  03/05/23 124/71  01/29/23 131/81  12/11/22 132/76   Wt Readings from Last 3 Encounters:  03/05/23 162 lb (73.5 kg)  02/25/23 162 lb (73.5 kg)  01/29/23 162 lb (73.5 kg)   Physical Exam Constitutional:      Appearance: Normal appearance.  HENT:     Head: Normocephalic and atraumatic.     Right Ear: Tympanic membrane, ear canal and external ear normal.     Left Ear: Tympanic membrane, ear canal and external ear normal.     Mouth/Throat:     Mouth: Mucous membranes are moist.     Pharynx: Oropharynx is clear.  Eyes:     Conjunctiva/sclera: Conjunctivae normal.     Pupils: Pupils are equal, round, and reactive to light.  Cardiovascular:     Rate and Rhythm: Normal rate and regular rhythm.     Heart sounds: Normal heart sounds.  Pulmonary:     Effort: Pulmonary effort is normal.     Breath sounds: Normal breath sounds.  Neurological:     General: No focal deficit present.     Mental Status: He is alert and oriented to  person, place, and time.     No results found for any visits on 03/18/23.  Last metabolic panel Lab Results  Component Value Date   GLUCOSE 94 03/12/2022   NA 137 03/12/2022   K 4.1 03/12/2022   CL 100 03/12/2022   CO2 22 03/12/2022   BUN 13 03/12/2022   CREATININE 1.09 03/12/2022   EGFR 67 03/12/2022   CALCIUM 9.2 03/12/2022   PROT 6.5 03/12/2022   ALBUMIN 4.4 03/12/2022   LABGLOB 2.1 03/12/2022   AGRATIO 2.1 03/12/2022   BILITOT 0.5 03/12/2022   ALKPHOS 136 (H) 03/12/2022   AST 27 03/12/2022   ALT 22 03/12/2022   ANIONGAP 7 04/29/2018   Last lipids Lab Results  Component Value Date   CHOL 138 12/28/2019   HDL 52 12/28/2019   LDLCALC 76 12/28/2019   TRIG 45 12/28/2019   CHOLHDL 1.8 12/08/2018   Last hemoglobin A1c Lab Results  Component Value Date   HGBA1C 5.4 08/18/2016       Assessment & Plan:    Routine Health Maintenance and Physical Exam  Immunization History  Administered Date(s) Administered   Fluad Quad(high Dose 65+) 02/08/2019, 03/02/2020, 03/12/2022   Influenza Split 03/02/2012, 03/17/2013, 05/07/2014, 02/23/2018   Influenza Whole 03/24/2007, 03/15/2009, 02/15/2010   Influenza, High Dose Seasonal PF 05/04/2015, 05/04/2015, 03/21/2016, 03/21/2016, 03/24/2017, 03/24/2017   Influenza,inj,Quad PF,6+ Mos 03/14/2021   Influenza-Unspecified 02/23/2018   PFIZER(Purple Top)SARS-COV-2 Vaccination 06/28/2019, 07/19/2019, 03/20/2020, 03/01/2023   Pfizer Covid-19 Vaccine Bivalent Booster 74yrs & up 03/16/2021   Pneumococcal Conjugate-13 11/17/2013   Pneumococcal Polysaccharide-23 11/19/2004, 03/19/2007, 03/17/2009, 02/09/2020   Td 05/16/1997, 05/14/2007, 09/11/2010   Td (Adult), 2 Lf Tetanus Toxid, Preservative Free 05/16/1997, 05/14/2007, 09/11/2010   Tdap 03/14/2021   Zoster Recombinant(Shingrix) 02/23/2018, 04/28/2018   Zoster, Live 01/02/2011    Health Maintenance  Topic Date Due   COVID-19 Vaccine (6 - 2023-24 season) 04/26/2023   Medicare  Annual Wellness (AWV)  02/25/2024   DTaP/Tdap/Td (7 - Td or Tdap) 03/15/2031   Pneumonia Vaccine 37+ Years old  Completed   INFLUENZA VACCINE  Completed   Zoster Vaccines- Shingrix  Completed   HPV VACCINES  Aged Out    Discussed health benefits of physical activity, and encouraged him to engage in regular exercise appropriate for his age and condition.  Problem List Items Addressed This Visit       Cardiovascular and Mediastinum   Essential hypertension (Chronic)    Chronic and stable  In office BP was at goal at 127 / 79 Continue current management  Recheck CMP      Relevant Orders   Comprehensive metabolic panel   Paroxysmal tachycardia (HCC)    Isolated episode  Followed by cardiology      Coronary artery disease involving native coronary artery of native heart    Chronic  Followed by cardiology  Continue statin  Recheck lipid panel      Relevant Orders   Lipid panel   CHF (congestive heart failure) (HCC)    Chronic  Echo in 2018 - EF 53%, borderline systolic dysfunction  Followed by cardiology         Respiratory   CAFL (chronic airflow limitation) (HCC)    Chronic and stable  Continue current management  Followed by pulmonology      Allergic bronchopulmonary aspergillosis (HCC)    Likely diagnosis per pulm, does not meet strict criteria  Followed by pulmonology      Chronic obstructive asthma (HCC)    Chronic and stable  Continue current management  Followed by pulmonology        Musculoskeletal and Integument   Other nonthrombocytopenic purpura (HCC)    Chronic and stable        Genitourinary   Benign prostatic hyperplasia without urinary obstruction    Chronic  Followed by urology         Other   History of DVT (deep vein thrombosis)    Currently on lifelong anticoagulation       Open-angle glaucoma of both eyes    Stable  Continue current management  Followed by opthomology      Insomnia, unspecified    Chronic, has been  occurring constantly over the past year  Wakes  up consistently at 2 am every night  Only drinks coffee in the morning and does not nap regularly  Takes melatonin 6 mg nightly with no relief  Discussed importance of good sleep hygiene  Take trazodone 25 mg nightly for sleep, will start on lowest dose and increase prn        Chronic anticoagulation    Recheck CBC w/ diff      Relevant Orders   CBC w/Diff/Platelet   Hyperglycemia    Recheck HbgA1c       Relevant Orders   Hemoglobin A1c   Other Visit Diagnoses     Encounter for annual physical exam    -  Primary   Relevant Orders   Lipid panel   CBC w/Diff/Platelet   Comprehensive metabolic panel   Hemoglobin A1c      Return in about 1 year (around 03/17/2024) for CPE.     Rometta Emery, Medical Student   Patient seen along with MS3 student Jodi Marble. I personally evaluated this patient along with the student, and verified all aspects of the history, physical exam, and medical decision making as documented by the student. I agree with the student's documentation and have made all necessary edits.  Hever Castilleja, Marzella Schlein, MD, MPH [facility]

## 2023-03-18 NOTE — Assessment & Plan Note (Signed)
Recheck CBC w/ diff

## 2023-03-18 NOTE — Assessment & Plan Note (Signed)
Likely diagnosis per pulm, does not meet strict criteria  Followed by pulmonology

## 2023-03-18 NOTE — Assessment & Plan Note (Signed)
Chronic  Followed by urology

## 2023-03-18 NOTE — Assessment & Plan Note (Addendum)
Chronic and stable  In office BP was at goal at 127 / 79 Continue current management  Recheck CMP

## 2023-03-18 NOTE — Assessment & Plan Note (Signed)
Currently on lifelong anticoagulation

## 2023-03-18 NOTE — Assessment & Plan Note (Addendum)
Chronic and stable  Continue current management  Followed by pulmonology

## 2023-03-18 NOTE — Assessment & Plan Note (Signed)
Stable  Continue current management  Followed by opthomology

## 2023-03-18 NOTE — Assessment & Plan Note (Signed)
Chronic  Followed by cardiology  Continue statin  Recheck lipid panel

## 2023-03-18 NOTE — Assessment & Plan Note (Signed)
Chronic and stable.   

## 2023-03-18 NOTE — Assessment & Plan Note (Signed)
Chronic, has been occurring constantly over the past year  Wakes up consistently at 2 am every night  Only drinks coffee in the morning and does not nap regularly  Takes melatonin 6 mg nightly with no relief  Discussed importance of good sleep hygiene  Take trazodone 25 mg nightly for sleep, will start on lowest dose and increase prn

## 2023-03-19 ENCOUNTER — Ambulatory Visit: Payer: PPO | Admitting: Urology

## 2023-03-19 LAB — COMPREHENSIVE METABOLIC PANEL WITH GFR
ALT: 20 [IU]/L (ref 0–44)
AST: 20 [IU]/L (ref 0–40)
Albumin: 3.9 g/dL (ref 3.7–4.7)
Alkaline Phosphatase: 122 [IU]/L — ABNORMAL HIGH (ref 44–121)
BUN/Creatinine Ratio: 11 (ref 10–24)
BUN: 11 mg/dL (ref 8–27)
Bilirubin Total: 0.6 mg/dL (ref 0.0–1.2)
CO2: 24 mmol/L (ref 20–29)
Calcium: 9.1 mg/dL (ref 8.6–10.2)
Chloride: 98 mmol/L (ref 96–106)
Creatinine, Ser: 0.96 mg/dL (ref 0.76–1.27)
Globulin, Total: 2.1 g/dL (ref 1.5–4.5)
Glucose: 93 mg/dL (ref 70–99)
Potassium: 4 mmol/L (ref 3.5–5.2)
Sodium: 135 mmol/L (ref 134–144)
Total Protein: 6 g/dL (ref 6.0–8.5)
eGFR: 78 mL/min/{1.73_m2}

## 2023-03-19 LAB — CBC WITH DIFFERENTIAL/PLATELET
Basophils Absolute: 0.1 10*3/uL (ref 0.0–0.2)
Basos: 1 %
EOS (ABSOLUTE): 0.1 10*3/uL (ref 0.0–0.4)
Eos: 1 %
Hematocrit: 44.2 % (ref 37.5–51.0)
Hemoglobin: 14.7 g/dL (ref 13.0–17.7)
Immature Grans (Abs): 0.1 10*3/uL (ref 0.0–0.1)
Immature Granulocytes: 1 %
Lymphocytes Absolute: 1.3 10*3/uL (ref 0.7–3.1)
Lymphs: 12 %
MCH: 32.8 pg (ref 26.6–33.0)
MCHC: 33.3 g/dL (ref 31.5–35.7)
MCV: 99 fL — ABNORMAL HIGH (ref 79–97)
Monocytes Absolute: 0.8 10*3/uL (ref 0.1–0.9)
Monocytes: 8 %
Neutrophils Absolute: 8.4 10*3/uL — ABNORMAL HIGH (ref 1.4–7.0)
Neutrophils: 77 %
Platelets: 248 10*3/uL (ref 150–450)
RBC: 4.48 x10E6/uL (ref 4.14–5.80)
RDW: 12.5 % (ref 11.6–15.4)
WBC: 10.8 10*3/uL (ref 3.4–10.8)

## 2023-03-19 LAB — LIPID PANEL
Chol/HDL Ratio: 2.5 {ratio} (ref 0.0–5.0)
Cholesterol, Total: 142 mg/dL (ref 100–199)
HDL: 56 mg/dL (ref >39)
LDL Chol Calc (NIH): 77 mg/dL (ref 0–99)
Triglycerides: 36 mg/dL (ref 0–149)
VLDL Cholesterol Cal: 9 mg/dL (ref 5–40)

## 2023-03-19 LAB — HEMOGLOBIN A1C
Est. average glucose Bld gHb Est-mCnc: 108 mg/dL
Hgb A1c MFr Bld: 5.4 % (ref 4.8–5.6)

## 2023-03-20 DIAGNOSIS — H353131 Nonexudative age-related macular degeneration, bilateral, early dry stage: Secondary | ICD-10-CM | POA: Diagnosis not present

## 2023-03-20 DIAGNOSIS — H401122 Primary open-angle glaucoma, left eye, moderate stage: Secondary | ICD-10-CM | POA: Diagnosis not present

## 2023-03-20 DIAGNOSIS — Z961 Presence of intraocular lens: Secondary | ICD-10-CM | POA: Diagnosis not present

## 2023-03-20 DIAGNOSIS — H401113 Primary open-angle glaucoma, right eye, severe stage: Secondary | ICD-10-CM | POA: Diagnosis not present

## 2023-03-24 DIAGNOSIS — Z961 Presence of intraocular lens: Secondary | ICD-10-CM | POA: Diagnosis not present

## 2023-03-24 DIAGNOSIS — H401122 Primary open-angle glaucoma, left eye, moderate stage: Secondary | ICD-10-CM | POA: Diagnosis not present

## 2023-03-24 DIAGNOSIS — H353131 Nonexudative age-related macular degeneration, bilateral, early dry stage: Secondary | ICD-10-CM | POA: Diagnosis not present

## 2023-03-24 DIAGNOSIS — H401113 Primary open-angle glaucoma, right eye, severe stage: Secondary | ICD-10-CM | POA: Diagnosis not present

## 2023-03-26 ENCOUNTER — Ambulatory Visit: Payer: PPO | Admitting: Urology

## 2023-03-31 NOTE — Progress Notes (Unsigned)
PTNS  Session # monthly maintenance  Health & Social Factors: No change Caffeine: 2 Alcohol: 1 Daytime voids #per day: 5 Night-time voids #per night: 2 Urgency: None Incontinence Episodes #per day: 0 Ankle used: Left Treatment Setting: 5 Feeling/ Response: Sensory Comments: Patient tolerated the procedure   Performed By: Michiel Cowboy, PA-C   Follow Up: One month for PTNS maintenance

## 2023-04-01 ENCOUNTER — Other Ambulatory Visit: Payer: Self-pay | Admitting: Family Medicine

## 2023-04-01 NOTE — Telephone Encounter (Signed)
Requested Prescriptions  Pending Prescriptions Disp Refills   diltiazem (CARDIZEM CD) 180 MG 24 hr capsule [Pharmacy Med Name: DILTIAZEM HCL ER COATED BEADS 180 M] 30 capsule 5    Sig: TAKE 1 CAPSULE BY MOUTH ONCE DAILY     Cardiovascular: Calcium Channel Blockers 3 Passed - 04/01/2023  8:01 AM      Passed - ALT in normal range and within 360 days    ALT  Date Value Ref Range Status  03/18/2023 20 0 - 44 IU/L Final   SGPT (ALT)  Date Value Ref Range Status  04/07/2014 24 U/L Final    Comment:    14-63 NOTE: New Reference Range 01/04/14          Passed - AST in normal range and within 360 days    AST  Date Value Ref Range Status  03/18/2023 20 0 - 40 IU/L Final   SGOT(AST)  Date Value Ref Range Status  04/07/2014 15 15 - 37 Unit/L Final         Passed - Cr in normal range and within 360 days    Creatinine  Date Value Ref Range Status  04/08/2014 1.23 0.60 - 1.30 mg/dL Final   Creatinine, Ser  Date Value Ref Range Status  03/18/2023 0.96 0.76 - 1.27 mg/dL Final         Passed - Last BP in normal range    BP Readings from Last 1 Encounters:  03/18/23 127/79         Passed - Last Heart Rate in normal range    Pulse Readings from Last 1 Encounters:  03/18/23 77         Passed - Valid encounter within last 6 months    Recent Outpatient Visits           2 weeks ago Encounter for annual physical exam   [facility] [place], Marzella Schlein, MD   1 year ago Annual physical exam   [facility] Bosie Clos, [place]   1 year ago Diverticulitis   [facility] Bosie Clos, MD   1 year ago Cellulitis of lower extremity, unspecified laterality   [facility] Bosie Clos, MD   1 year ago Cellulitis of lower extremity, unspecified laterality   [place] [facility] [place], [place], [place]       Future Appointments             Tomorrow  [place], Elana Alm [facility]rology [place]   In 1 week McGowan, Elana Alm [facility] U[facility] [place]   In 1 week Deirdre Evener, MD [facility] [place] [facility]   In 11 months Bacigalupo, Marzella Schlein, MD [facility]

## 2023-04-02 ENCOUNTER — Ambulatory Visit: Payer: PPO | Admitting: Urology

## 2023-04-02 DIAGNOSIS — R351 Nocturia: Secondary | ICD-10-CM

## 2023-04-09 ENCOUNTER — Ambulatory Visit: Payer: PPO | Admitting: Urology

## 2023-04-09 ENCOUNTER — Telehealth: Payer: Self-pay | Admitting: *Deleted

## 2023-04-09 NOTE — Patient Instructions (Signed)
Visit Information  Thank you for taking time to visit with me today. Please don't hesitate to contact me if I can be of assistance to you before our next scheduled telephone appointment.  Please call the care guide team at [phone] if you need to cancel or reschedule your appointment.   Please call the Suicide and Crisis Lifeline: 988 call the [place] National Suicide Prevention Lifeline: (303)106-0184 or TTY: 239-315-9228 TTY (610)505-5422) to talk to a trained counselor call 1-800-273-TALK (toll free, 24 hour hotline) call 911 if you are experiencing a Mental Health or Behavioral Health Crisis or need someone to talk to.  Patient verbalizes understanding of instructions and care plan provided today and agrees to view in MyChart. Active MyChart status and patient understanding of how to access instructions and care plan via MyChart confirmed with patient.     The patient has been provided with contact information for the care management team and has been advised to call with any health related questions or concerns.   Kemper Durie RN, MSN, CCM [facility], [facility] RN Care Coordinator Direct Dial: [phone] / Main [phone] Fax [phone] Email: [email or web address] Website: [email or web address]

## 2023-04-09 NOTE — Patient Outreach (Signed)
Care Coordination   Initial Visit Note   04/09/2023 Name: [name] MRN: [id number] DOB: 27-Aug-1940  [name] is a 82 y.o. year old male who sees Bacigalupo, Marzella Schlein, MD for primary care. I spoke with  [name] by phone today.  What matters to the patients health and wellness today?  Patient report he is doing well, on a regular exercise regime, eating healthy, and compliant with medication management and MD appointments.  Does not feel follow up is needed at this time.  Denies any urgent concerns, encouraged to contact this care manager with questions.      Goals Addressed             This Visit's Progress    COMPLETED: Care Coordination needs - no follow up needed       Interventions Today    Flowsheet Row Most Recent Value  Chronic Disease   Chronic disease during today's visit Chronic Obstructive Pulmonary Disease (COPD), Congestive Heart Failure (CHF), Hypertension (HTN), Atrial Fibrillation (AFib)  General Interventions   General Interventions Discussed/Reviewed General Interventions Reviewed, Doctor Visits, Vaccines, Durable Medical Equipment (DME)  Vaccines Flu  Doctor Visits Discussed/Reviewed Doctor Visits Discussed, PCP, Annual Wellness Visits, Specialist  [awv done on 10/1, follow up in a year.  Other upcoming appts dermatology tomorrow, urology 11/15, Podiatry 11/18, Pulmonary 11/20, Hematology 4/21]  Durable Medical Equipment (DME) BP Cuff  PCP/Specialist Visits Compliance with follow-up visit  Exercise Interventions   Exercise Discussed/Reviewed Weight Managment  Weight Management Weight maintenance  Education Interventions   Education Provided Provided Education  Provided Verbal Education On Medication, Nutrition, When to see the doctor  Nutrition Interventions   Nutrition Discussed/Reviewed Nutrition Discussed, Decreasing fats, Decreasing salt, Adding fruits and vegetables              SDOH assessments and interventions completed:   Yes  SDOH Interventions Today    Flowsheet Row Most Recent Value  SDOH Interventions   Food Insecurity Interventions Intervention Not Indicated  Housing Interventions Intervention Not Indicated  Transportation Interventions Intervention Not Indicated        Care Coordination Interventions:  Yes, provided   Follow up plan: No further intervention required.   Encounter Outcome:  Patient Visit Completed   Kemper Durie RN, MSN, CCM [place]  [facility], [facility] RN Care Coordinator Direct Dial: [phone] / Main [phone] Fax [phone] Email: [email or web address] Website: [email or web address]

## 2023-04-10 ENCOUNTER — Encounter: Payer: Self-pay | Admitting: Dermatology

## 2023-04-10 ENCOUNTER — Telehealth: Payer: Self-pay

## 2023-04-10 ENCOUNTER — Ambulatory Visit: Payer: PPO | Admitting: Dermatology

## 2023-04-10 DIAGNOSIS — L578 Other skin changes due to chronic exposure to nonionizing radiation: Secondary | ICD-10-CM

## 2023-04-10 DIAGNOSIS — L820 Inflamed seborrheic keratosis: Secondary | ICD-10-CM | POA: Diagnosis not present

## 2023-04-10 DIAGNOSIS — D485 Neoplasm of uncertain behavior of skin: Secondary | ICD-10-CM

## 2023-04-10 DIAGNOSIS — D492 Neoplasm of unspecified behavior of bone, soft tissue, and skin: Secondary | ICD-10-CM | POA: Diagnosis not present

## 2023-04-10 DIAGNOSIS — D2362 Other benign neoplasm of skin of left upper limb, including shoulder: Secondary | ICD-10-CM | POA: Diagnosis not present

## 2023-04-10 DIAGNOSIS — L821 Other seborrheic keratosis: Secondary | ICD-10-CM | POA: Diagnosis not present

## 2023-04-10 DIAGNOSIS — L570 Actinic keratosis: Secondary | ICD-10-CM

## 2023-04-10 DIAGNOSIS — Z8589 Personal history of malignant neoplasm of other organs and systems: Secondary | ICD-10-CM

## 2023-04-10 DIAGNOSIS — L814 Other melanin hyperpigmentation: Secondary | ICD-10-CM

## 2023-04-10 DIAGNOSIS — W908XXA Exposure to other nonionizing radiation, initial encounter: Secondary | ICD-10-CM

## 2023-04-10 DIAGNOSIS — Z85828 Personal history of other malignant neoplasm of skin: Secondary | ICD-10-CM

## 2023-04-10 DIAGNOSIS — D1801 Hemangioma of skin and subcutaneous tissue: Secondary | ICD-10-CM | POA: Diagnosis not present

## 2023-04-10 DIAGNOSIS — L80 Vitiligo: Secondary | ICD-10-CM

## 2023-04-10 DIAGNOSIS — Z1283 Encounter for screening for malignant neoplasm of skin: Secondary | ICD-10-CM

## 2023-04-10 DIAGNOSIS — D692 Other nonthrombocytopenic purpura: Secondary | ICD-10-CM

## 2023-04-10 NOTE — Telephone Encounter (Signed)
Updated specimen tracking and history from [facility] progress notes and photos of right dorsal and. aw

## 2023-04-10 NOTE — Patient Instructions (Signed)
Cryotherapy Aftercare  Wash gently with soap and water everyday.   Apply Vaseline and Band-Aid daily until healed.    Wound Care Instructions  Cleanse wound gently with soap and water once a day then pat dry with clean gauze. Apply a thin coat of Petrolatum (petroleum jelly, "Vaseline") over the wound (unless you have an allergy to this). We recommend that you use a new, sterile tube of Vaseline. Do not pick or remove scabs. Do not remove the yellow or white "healing tissue" from the base of the wound.  Cover the wound with fresh, clean, nonstick gauze and secure with paper tape. You may use Band-Aids in place of gauze and tape if the wound is small enough, but would recommend trimming much of the tape off as there is often too much. Sometimes Band-Aids can irritate the skin.  You should call the office for your biopsy report after 1 week if you have not already been contacted.  If you experience any problems, such as abnormal amounts of bleeding, swelling, significant bruising, significant pain, or evidence of infection, please call the office immediately.  FOR ADULT SURGERY PATIENTS: If you need something for pain relief you may take 1 extra strength Tylenol (acetaminophen) AND 2 Ibuprofen (200mg  each) together every 4 hours as needed for pain. (do not take these if you are allergic to them or if you have a reason you should not take them.) Typically, you may only need pain medication for 1 to 3 days.   Melanoma ABCDEs  Melanoma is the most dangerous type of skin cancer, and is the leading cause of death from skin disease.  You are more likely to develop melanoma if you: Have light-colored skin, light-colored eyes, or red or blond hair Spend a lot of time in the sun Tan regularly, either outdoors or in a tanning bed Have had blistering sunburns, especially during childhood Have a close family member who has had a melanoma Have atypical moles or large birthmarks  Early detection of  melanoma is key since treatment is typically straightforward and cure rates are extremely high if we catch it early.   The first sign of melanoma is often a change in a mole or a new dark spot.  The ABCDE system is a way of remembering the signs of melanoma.  A for asymmetry:  The two halves do not match. B for border:  The edges of the growth are irregular. C for color:  A mixture of colors are present instead of an even brown color. D for diameter:  Melanomas are usually (but not always) greater than 6mm - the size of a pencil eraser. E for evolution:  The spot keeps changing in size, shape, and color.  Please check your skin once per month between visits. You can use a small mirror in front and a large mirror behind you to keep an eye on the back side or your body.   If you see any new or changing lesions before your next follow-up, please call to schedule a visit.  Please continue daily skin protection including broad spectrum sunscreen SPF 30+ to sun-exposed areas, reapplying every 2 hours as needed when you're outdoors.    Due to recent changes in healthcare laws, you may see results of your pathology and/or laboratory studies on MyChart before the doctors have had a chance to review them. We understand that in some cases there may be results that are confusing or concerning to you. Please understand that not all  results are received at the same time and often the doctors may need to interpret multiple results in order to provide you with the best plan of care or course of treatment. Therefore, we ask that you please give [place] 2 business days to thoroughly review all your results before contacting the office for clarification. Should we see a critical lab result, you will be contacted sooner.   If You Need Anything After Your Visit  If you have any questions or concerns for your doctor, please call our main line at [phone] and press option 4 to reach your doctor's medical assistant. If  no one answers, please leave a voicemail as directed and we will return your call as soon as possible. Messages left after 4 pm will be answered the following business day.   You may also send [place] a message via MyChart. We typically respond to MyChart messages within 1-2 business days.  For prescription refills, please ask your pharmacy to contact our office. Our fax number is [phone].  If you have an urgent issue when the clinic is closed that cannot wait until the next business day, you can page your doctor at the number below.    Please note that while we do our best to be available for urgent issues outside of office hours, we are not available 24/7.   If you have an urgent issue and are unable to reach [place], you may choose to seek medical care at your doctor's office, retail clinic, urgent care center, or emergency room.  If you have a medical emergency, please immediately call 911 or go to the emergency department.  Pager Numbers  - Dr. Gwen Pounds: [phone]  - Dr. Roseanne Reno: [phone]  - Dr. Katrinka Blazing: [phone]   In the event of inclement weather, please call our main line at [phone] for an update on the status of any delays or closures.  Dermatology Medication Tips: Please keep the boxes that topical medications come in in order to help keep track of the instructions about where and how to use these. Pharmacies typically print the medication instructions only on the boxes and not directly on the medication tubes.   If your medication is too expensive, please contact our office at [phone] option 4 or send [place] a message through MyChart.   We are unable to tell what your co-pay for medications will be in advance as this is different depending on your insurance coverage. However, we may be able to find a substitute medication at lower cost or fill out paperwork to get insurance to cover a needed medication.   If a prior authorization is required to get your medication  covered by your insurance company, please allow [place] 1-2 business days to complete this process.  Drug prices often vary depending on where the prescription is filled and some pharmacies may offer cheaper prices.  The website www.goodrx.com contains coupons for medications through different pharmacies. The prices here do not account for what the cost may be with help from insurance (it may be cheaper with your insurance), but the website can give you the price if you did not use any insurance.  - You can print the associated coupon and take it with your prescription to the pharmacy.  - You may also stop by our office during regular business hours and pick up a GoodRx coupon card.  - If you need your prescription sent electronically to a different pharmacy, notify our office through [facility] or by phone at  [phone] option 4.     Si Usted Necesita Algo Despus de Su Visita  Tambin puede enviarnos un mensaje a travs de Clinical cytogeneticist. Por lo general respondemos a los mensajes de MyChart en el transcurso de 1 a 2 das hbiles.  Para renovar recetas, por favor pida a su farmacia que se ponga en contacto con nuestra oficina. Annie Sable de fax es [place] [phone].  Si tiene un asunto urgente cuando la clnica est cerrada y que no puede esperar hasta el siguiente da hbil, puede llamar/localizar a su doctor(a) al nmero que aparece a continuacin.   Por favor, tenga en cuenta que aunque hacemos todo lo posible para estar disponibles para asuntos urgentes fuera del horario de [place], no estamos disponibles las 24 horas del da, los 7 [address]  [address] de la [place].   Si tiene un problema urgente y no puede comunicarse con nosotros, puede optar por buscar atencin mdica  en el consultorio de su doctor(a), en una clnica privada, en un centro de atencin urgente o en una sala de emergencias.  Si tiene Engineer, drilling, por favor llame inmediatamente al 911 o vaya a la sala de  emergencias.  Nmeros de bper  - Dr. Gwen Pounds: [phone]  - Dra. Roseanne Reno: [phone]  - Dr. Katrinka Blazing: [phone]   En caso de inclemencias del tiempo, por favor llame a Lacy Duverney principal al [phone] para una actualizacin sobre el [place] de cualquier retraso o cierre.  Consejos para la medicacin en dermatologa: Por favor, guarde las cajas en las que vienen los medicamentos de uso tpico para ayudarle a seguir las instrucciones sobre dnde y cmo usarlos. Las farmacias generalmente imprimen las instrucciones del medicamento slo en las cajas y no directamente en los tubos del [place].   Si su medicamento es muy caro, por favor, pngase en contacto con Rolm Gala llamando al [phone] y presione la opcin 4 o envenos un mensaje a travs de Clinical cytogeneticist.   No podemos decirle cul ser su copago por los medicamentos por adelantado ya que esto es diferente dependiendo de la cobertura de su seguro. Sin embargo, es posible que podamos encontrar un medicamento sustituto a Audiological scientist un formulario para que el seguro cubra el medicamento que se considera necesario.   Si se requiere una autorizacin previa para que su compaa de seguros [place] su medicamento, por favor permtanos de 1 a 2 das hbiles para completar [address]  Los precios de los medicamentos varan con frecuencia dependiendo del Environmental consultant de dnde se surte la receta y alguna farmacias pueden ofrecer precios ms baratos.  El sitio web www.goodrx.com tiene cupones para medicamentos de Health and safety inspector. Los precios aqu no tienen en cuenta lo que podra costar con la ayuda del seguro (puede ser ms barato con su seguro), pero el sitio web puede darle el precio si no utiliz Tourist information centre manager.  - Puede imprimir el cupn correspondiente y llevarlo con su receta a la farmacia.  - Tambin puede pasar por nuestra oficina durante el horario de atencin regular y Education officer, museum una tarjeta de cupones de GoodRx.  - Si  necesita que su receta se enve electrnicamente a una farmacia diferente, informe a nuestra oficina a travs de MyChart de [place] o por telfono llamando al [phone] y presione la opcin 4.

## 2023-04-10 NOTE — Progress Notes (Signed)
Follow-Up Visit   Subjective  [name] is a 82 y.o. male who presents for the following: Skin Cancer Screening and Full Body Skin Exam  The patient presents for Total-Body Skin Exam (TBSE) for skin cancer screening and mole check. The patient has spots, moles and lesions to be evaluated, some may be new or changing and the patient may have concern these could be cancer.  Patient with hx of SCC, BCC. Patient c/o rough texture at scalp, what feels like a big pimple at back of scalp. He knocks it off when combing his hair but it grows back. It is sore, present for about 1 year.   The following portions of the chart were reviewed this encounter and updated as appropriate: medications, allergies, medical history  Review of Systems:  No other skin or systemic complaints except as noted in HPI or Assessment and Plan.  Objective  Well appearing patient in no apparent distress; mood and affect are within normal limits.  A full examination was performed including scalp, head, eyes, ears, nose, lips, neck, chest, axillae, abdomen, back, buttocks, bilateral upper extremities, bilateral lower extremities, hands, feet, fingers, toes, fingernails, and toenails. All findings within normal limits unless otherwise noted below.   Relevant physical exam findings are noted in the Assessment and Plan.  face x 6, L hand x 4, R hand x 1 (11) Erythematous thin papules/macules with gritty scale.   Scalp x 4 (4) Erythematous stuck-on, waxy papule or plaque  left lateral distal deltoid Crusted papule 0.6 cm    Assessment & Plan   SKIN CANCER SCREENING PERFORMED TODAY.  ACTINIC DAMAGE - Chronic condition, secondary to cumulative UV/sun exposure - diffuse scaly erythematous macules with underlying dyspigmentation - Recommend daily broad spectrum sunscreen SPF 30+ to sun-exposed areas, reapply every 2 hours as needed.  - Staying in the shade or wearing long sleeves, sun glasses (UVA+UVB protection)  and wide brim hats (4-inch brim around the entire circumference of the hat) are also recommended for sun protection.  - Call for new or changing lesions.  LENTIGINES, SEBORRHEIC KERATOSES, HEMANGIOMAS - Benign normal skin lesions - Benign-appearing - Call for any changes  MELANOCYTIC NEVI - Tan-brown and/or pink-flesh-colored symmetric macules and papules - Benign appearing on exam today - Observation - Call clinic for new or changing moles - Recommend daily use of broad spectrum spf 30+ sunscreen to sun-exposed areas.   HISTORY OF BASAL CELL CARCINOMA OF THE SKIN - No evidence of recurrence today - Recommend regular full body skin exams - Recommend daily broad spectrum sunscreen SPF 30+ to sun-exposed areas, reapply every 2 hours as needed.  - Call if any new or changing lesions are noted between office visits  HISTORY OF SQUAMOUS CELL CARCINOMA OF THE SKIN - No evidence of recurrence today - No lymphadenopathy - Recommend regular full body skin exams - Recommend daily broad spectrum sunscreen SPF 30+ to sun-exposed areas, reapply every 2 hours as needed.  - Call if any new or changing lesions are noted between office visits  Purpura - Chronic; persistent and recurrent.  Treatable, but not curable. - Violaceous macules and patches - Benign - Related to trauma, age, sun damage and/or use of blood thinners, chronic use of topical and/or oral steroids - Observe - Can use OTC arnica containing moisturizer such as Dermend Bruise Formula if desired - Call for worsening or other concerns  VITILIGO Exam: depigmented patches on trunk, extremities  Vitiligo is a chronic autoimmune condition which causes  loss of skin pigment and is commonly seen on the face and may also involve areas of trauma like hands, elbows, knees, and ankles. There is no cure and it is difficult to treat.  Treatments include topical steroids and other topical anti-inflammatory ointments/creams and topical and oral  Jak inhibitors.  Sometimes narrow band UV light therapy or Xtrac laser is helpful, both of which require twice weekly treatments for at least 3-6 months.  Antioxidant vitamins, such as Vitamins A,C,E,D, Folic Acid and B12 may be added to enhance treatment. Heliocare may also enhance treatment results.  Treatment Plan: Deferred      AK (actinic keratosis) (11) face x 6, L hand x 4, R hand x 1  Actinic keratoses are precancerous spots that appear secondary to cumulative UV radiation exposure/sun exposure over time. They are chronic with expected duration over 1 year. A portion of actinic keratoses will progress to squamous cell carcinoma of the skin. It is not possible to reliably predict which spots will progress to skin cancer and so treatment is recommended to prevent development of skin cancer.  Recommend daily broad spectrum sunscreen SPF 30+ to sun-exposed areas, reapply every 2 hours as needed.  Recommend staying in the shade or wearing long sleeves, sun glasses (UVA+UVB protection) and wide brim hats (4-inch brim around the entire circumference of the hat). Call for new or changing lesions.   Destruction of lesion - face x 6, L hand x 4, R hand x 1 (11) Complexity: simple   Destruction method: cryotherapy   Informed consent: discussed and consent obtained   Timeout:  patient name, date of birth, surgical site, and procedure verified Lesion destroyed using liquid nitrogen: Yes   Region frozen until ice ball extended beyond lesion: Yes   Outcome: patient tolerated procedure well with no complications   Post-procedure details: wound care instructions given    Inflamed seborrheic keratosis (4) Scalp x 4  Symptomatic, irritating, patient would like treated.  Benign-appearing.  Call clinic for new or changing lesions.    Destruction of lesion - Scalp x 4 (4) Complexity: simple   Destruction method: cryotherapy   Informed consent: discussed and consent obtained   Timeout:   patient name, date of birth, surgical site, and procedure verified Lesion destroyed using liquid nitrogen: Yes   Region frozen until ice ball extended beyond lesion: Yes   Outcome: patient tolerated procedure well with no complications   Post-procedure details: wound care instructions given    Neoplasm of uncertain behavior of skin left lateral distal deltoid  Epidermal / dermal shaving  Lesion diameter (cm):  0.6 Informed consent: discussed and consent obtained   Timeout: patient name, date of birth, surgical site, and procedure verified   Procedure prep:  Patient was prepped and draped in usual sterile fashion Prep type:  Isopropyl alcohol Anesthesia: the lesion was anesthetized in a standard fashion   Anesthetic:  1% lidocaine w/ epinephrine 1-100,000 buffered w/ 8.4% NaHCO3 Instrument used: flexible razor blade   Hemostasis achieved with: pressure, aluminum chloride and electrodesiccation   Outcome: patient tolerated procedure well   Post-procedure details: sterile dressing applied and wound care instructions given   Dressing type: bandage and petrolatum    Destruction of lesion  Destruction method: electrodesiccation and curettage   Informed consent: discussed and consent obtained   Timeout:  patient name, date of birth, surgical site, and procedure verified Anesthesia: the lesion was anesthetized in a standard fashion   Anesthetic:  1% lidocaine w/ epinephrine 1-100,000 buffered  w/ 8.4% NaHCO3 Curettage performed in three different directions: Yes   Electrodesiccation performed over the curetted area: Yes   Curettage cycles:  3 Lesion length (cm):  0.6 Lesion width (cm):  0.6 Margin per side (cm):  0.2 Final wound size (cm):  1 Hemostasis achieved with:  electrodesiccation Outcome: patient tolerated procedure well with no complications   Post-procedure details: sterile dressing applied and wound care instructions given   Dressing type: petrolatum    Specimen 1 -  Surgical pathology Differential Diagnosis: r/o BCC vs SCC  Check Margins: No Crusted papule 0.6 cm   Return in about 6 months (around 10/09/2023) for UBSE, with Dr. Kirtland Bouchard, Hx BCC, Hx SCC, Hx AK.  Anise Salvo, RMA, am acting as scribe for Armida Sans, MD .   Documentation: I have reviewed the above documentation for accuracy and completeness, and I agree with the above.  Armida Sans, MD

## 2023-04-14 LAB — SURGICAL PATHOLOGY

## 2023-04-15 ENCOUNTER — Telehealth: Payer: Self-pay

## 2023-04-15 NOTE — Telephone Encounter (Signed)
Left voicemail to return my call

## 2023-04-15 NOTE — Telephone Encounter (Signed)
-----   Message from Armida Sans sent at 04/14/2023  5:48 PM EDT ----- FINAL DIAGNOSIS        1. Skin, left lateral distal deltoid :       SEBACEOUS ADENOMA, SEE DESCRIPTION   Benign Sebaceous Adenoma No further treatment needed

## 2023-04-16 ENCOUNTER — Telehealth: Payer: Self-pay

## 2023-04-16 NOTE — Telephone Encounter (Signed)
-----   Message from Armida Sans sent at 04/14/2023  5:48 PM EDT ----- FINAL DIAGNOSIS        1. Skin, left lateral distal deltoid :       SEBACEOUS ADENOMA, SEE DESCRIPTION   Benign Sebaceous Adenoma No further treatment needed

## 2023-04-16 NOTE — Telephone Encounter (Signed)
Left message for patient to return my call.

## 2023-04-17 NOTE — Telephone Encounter (Signed)
Completed. aw

## 2023-04-30 NOTE — Progress Notes (Signed)
PTNS  Session # 15 (Monthly Maintenance)  Health & Social Factors: no change Caffeine: 2 Alcohol: 2-3 glases of wine per week Daytime voids #per day: 5 Night-time voids #per night: 2 Urgency: mild Incontinence Episodes #per day: 0 Ankle used: left Treatment Setting: 5 Feeling/ Response: Sensory Comments: Patient tolerated  Performed By: Michiel Cowboy, PA-C   Follow Up: 1 month for monthly maintenance

## 2023-05-02 ENCOUNTER — Encounter: Payer: Self-pay | Admitting: Urology

## 2023-05-02 ENCOUNTER — Ambulatory Visit: Payer: PPO | Admitting: Urology

## 2023-05-02 DIAGNOSIS — R351 Nocturia: Secondary | ICD-10-CM | POA: Diagnosis not present

## 2023-05-05 ENCOUNTER — Ambulatory Visit: Payer: PPO | Admitting: Podiatry

## 2023-05-05 ENCOUNTER — Encounter: Payer: Self-pay | Admitting: Podiatry

## 2023-05-05 DIAGNOSIS — M19071 Primary osteoarthritis, right ankle and foot: Secondary | ICD-10-CM | POA: Diagnosis not present

## 2023-05-05 MED ORDER — TRIAMCINOLONE ACETONIDE 40 MG/ML IJ SUSP
20.0000 mg | Freq: Once | INTRAMUSCULAR | Status: AC
  Administered 2023-05-05: 20 mg

## 2023-05-05 NOTE — Progress Notes (Signed)
Of the diabetes who presents today chief complaint of pain right here as a Halotex marking the spot proximal aspect of the fourth intermetatarsal space of the right foot.  He states this been going on just for a few days he states that the last injection we gave him did really well for a long time.  No new trauma.  Objective: Vital signs are stable alert oriented x 3.  Pulses are palpable.  He has pain on palpation and attempted range of motion of the fourth fifth tarsometatarsal joint proximally most likely osteoarthritic change.  Assessment: Osteoarthritic change fourth proximal intermetatarsal space.  Plan: Injected the area today with 10 mg Kenalog coverlets Marcaine point maximal tenderness.  Follow-up with him in 4 months.

## 2023-05-07 DIAGNOSIS — J4489 Other specified chronic obstructive pulmonary disease: Secondary | ICD-10-CM | POA: Diagnosis not present

## 2023-05-07 DIAGNOSIS — J329 Chronic sinusitis, unspecified: Secondary | ICD-10-CM | POA: Diagnosis not present

## 2023-05-07 DIAGNOSIS — J4550 Severe persistent asthma, uncomplicated: Secondary | ICD-10-CM | POA: Diagnosis not present

## 2023-05-07 DIAGNOSIS — K219 Gastro-esophageal reflux disease without esophagitis: Secondary | ICD-10-CM | POA: Diagnosis not present

## 2023-05-12 DIAGNOSIS — J45909 Unspecified asthma, uncomplicated: Secondary | ICD-10-CM | POA: Diagnosis not present

## 2023-05-12 DIAGNOSIS — J449 Chronic obstructive pulmonary disease, unspecified: Secondary | ICD-10-CM | POA: Diagnosis not present

## 2023-05-15 DIAGNOSIS — J4550 Severe persistent asthma, uncomplicated: Secondary | ICD-10-CM | POA: Diagnosis not present

## 2023-05-20 ENCOUNTER — Other Ambulatory Visit: Payer: Self-pay | Admitting: Family Medicine

## 2023-05-20 DIAGNOSIS — N401 Enlarged prostate with lower urinary tract symptoms: Secondary | ICD-10-CM

## 2023-05-20 DIAGNOSIS — J4489 Other specified chronic obstructive pulmonary disease: Secondary | ICD-10-CM

## 2023-05-22 ENCOUNTER — Other Ambulatory Visit: Payer: Self-pay | Admitting: Family Medicine

## 2023-05-22 DIAGNOSIS — N401 Enlarged prostate with lower urinary tract symptoms: Secondary | ICD-10-CM

## 2023-05-22 MED ORDER — FINASTERIDE 5 MG PO TABS: 5.0000 mg | ORAL_TABLET | Freq: Every day | ORAL | 1 refills | Status: DC

## 2023-05-22 MED ORDER — TIOTROPIUM BROMIDE MONOHYDRATE 18 MCG IN CAPS: 18.0000 ug | ORAL_CAPSULE | Freq: Every evening | RESPIRATORY_TRACT | 5 refills | Status: AC

## 2023-05-22 NOTE — Telephone Encounter (Signed)
Medication Refill -  Most Recent Primary Care Visit:  Provider: Erasmo Downer  Department: [facility]  Visit Type: PHYSICAL  Date: 03/18/2023  Medication: finasteride (PROSCAR) 5 MG Pt left pharmacy a bit ago and was informed they had not received prescription. Pt requesting refill as soon as possible.   Has the patient contacted their pharmacy? Yes  Is this the correct pharmacy for this prescription? Yes  This is the patient's preferred pharmacy:  [facility] - [place], [place] - [address] Che[address] [place] [address] Phone: [phone] Fax: [phone]   Has the prescription been filled recently? Yes  Is the patient out of the medication? No Will take last dose tonight.   Has the patient been seen for an appointment in the last year OR does the patient have an upcoming appointment? Yes  Can we respond through MyChart? Yes  Agent: Please be advised that Rx refills may take up to 3 business days. We ask that you follow-up with your pharmacy.

## 2023-05-22 NOTE — Telephone Encounter (Signed)
Resending, not received.  Requested Prescriptions  Pending Prescriptions Disp Refills   finasteride (PROSCAR) 5 MG tablet 90 tablet 1    Sig: Take 1 tablet (5 mg total) by mouth daily.     Urology: 5-alpha Reductase Inhibitors Failed - 05/22/2023  4:26 PM      Failed - PSA in normal range and within 360 days    PSA  Date Value Ref Range Status  11/17/2013 3.6  Final   Prostate Specific Ag, Serum  Date Value Ref Range Status  03/12/2022 3.0 0.0 - 4.0 ng/mL Final    Comment:    Roche ECLIA methodology. According to the American Urological Association, Serum PSA should decrease and remain at undetectable levels after radical prostatectomy. The AUA defines biochemical recurrence as an initial PSA value 0.2 ng/mL or greater followed by a subsequent confirmatory PSA value 0.2 ng/mL or greater. Values obtained with different assay methods or kits cannot be used interchangeably. Results cannot be interpreted as absolute evidence of the presence or absence of malignant disease.          Passed - Valid encounter within last 12 months    Recent Outpatient Visits           2 months ago Encounter for annual physical exam   [place] [facility] [place], Marzella Schlein, MD   1 year ago Annual physical exam   [place] [facility] Bosie Clos, MD   1 year ago Diverticulitis   [facility] Bosie Clos, MD   1 year ago Cellulitis of lower extremity, unspecified laterality   [place] [facility] Bosie Clos, MD   1 year ago Cellulitis of lower extremity, unspecified laterality   [facility] [place], Edmon Crape, PA-C       Future Appointments             In 1 week McGowan, Elana Alm [facility] [place]   In 5 months Deirdre Evener, MD [facility]   In 10 months Bacigalupo, Marzella Schlein, MD [facility], [facility]

## 2023-05-22 NOTE — Progress Notes (Signed)
PTNS  Session # Monthly maintenance   Health & Social Factors: No change Caffeine: 2 Alcohol: 3 glasses of wine per week  Daytime voids #per day: 5 Night-time voids #per night: 2 Urgency: Mild Incontinence Episodes #per day: 0 Ankle used: Left Treatment Setting: 18 Feeling/ Response: Sensory Comments: Patient tolerated procedure   Performed By: Michiel Cowboy, PA-C   Follow Up: One month for maintenance PTNS

## 2023-06-03 ENCOUNTER — Ambulatory Visit: Payer: PPO | Admitting: Urology

## 2023-06-03 DIAGNOSIS — R350 Frequency of micturition: Secondary | ICD-10-CM | POA: Diagnosis not present

## 2023-06-03 DIAGNOSIS — R351 Nocturia: Secondary | ICD-10-CM

## 2023-06-19 ENCOUNTER — Other Ambulatory Visit: Payer: Self-pay | Admitting: Family Medicine

## 2023-06-19 NOTE — Telephone Encounter (Signed)
 Medication Refill -  Most Recent Primary Care Visit:  Provider: MYRLA [name]  Department: [facility]  Visit Type: PHYSICAL  Date: 03/18/2023  Medication: omeprazole  (PRILOSEC) 20 MG capsule   Has the patient contacted their pharmacy? Yes   Is this the correct pharmacy for this prescription? Yes If no, delete pharmacy and type the correct one.  This is the patient's preferred pharmacy:  [facility] - [place], [place] - [address] RICHARDO GORMAN TOMMI DEITRA [place] [address] Phone: [phone] Fax: [phone]   Has the prescription been filled recently? No  Is the patient out of the medication? Yes he states he has been out of his medication for a week  Has the patient been seen for an appointment in the last year OR does the patient have an upcoming appointment? Yes  Can we respond through MyChart? Yes  Please assist patient further

## 2023-06-23 MED ORDER — OMEPRAZOLE 20 MG PO CPDR: 20.0000 mg | DELAYED_RELEASE_CAPSULE | Freq: Every day | ORAL | 0 refills | Status: DC

## 2023-06-23 NOTE — Telephone Encounter (Signed)
 Requested Prescriptions  Pending Prescriptions Disp Refills   omeprazole  (PRILOSEC) 20 MG capsule 90 capsule 0    Sig: Take 1 capsule (20 mg total) by mouth daily.     Gastroenterology: Proton Pump Inhibitors Passed - 06/23/2023 11:34 AM      Passed - Valid encounter within last 12 months    Recent Outpatient Visits           3 months ago Encounter for annual physical exam   [facility] [place], Clinton HERO, [name]   1 year ago Annual physical exam   [facility] [place] LITTIE Mickey., [name]   1 year ago Diverticulitis   [facility] [place] LITTIE Mickey., [name]   1 year ago Cellulitis of lower extremity, unspecified laterality   [facility] [place] LITTIE Mickey., [name]   1 year ago Cellulitis of lower extremity, unspecified laterality   [facility] [place], [place], PA-C       Future Appointments             In 1 week McGowan, [name] [facility] [place]   In 4 months Hester Alm BROCKS, [name] [facility] [place] [facility]   In 8 months Bacigalupo, Clinton HERO, [name] [facility]

## 2023-07-01 NOTE — Progress Notes (Unsigned)
PTNS  Session # monthly maintenance  Health & Social Factors: no change Caffeine: 2 Alcohol: 0 Daytime voids #per day: 5 Night-time voids #per night: 3 Urgency: none Incontinence Episodes #per day: none Ankle used: left Treatment Setting: 7 Feeling/ Response: sensory Comments: f/u 1 month  Performed By: Jearld Pies  Follow Up: 1 month

## 2023-07-04 ENCOUNTER — Ambulatory Visit: Payer: PPO | Admitting: Urology

## 2023-07-04 DIAGNOSIS — R351 Nocturia: Secondary | ICD-10-CM

## 2023-07-04 DIAGNOSIS — R350 Frequency of micturition: Secondary | ICD-10-CM

## 2023-07-14 NOTE — Progress Notes (Signed)
PTNS  Session # monthly maintenance  Health & Social Factors: no change Caffeine: 2 Alcohol: 0 Daytime voids #per day: 5 Night-time voids #per night: 3 Urgency: none Incontinence Episodes #per day: none Ankle used: left Treatment Setting: 7 Feeling/ Response: sensory Comments: f/u 1 month  Performed By: Jearld Pies  Follow Up: 1 month

## 2023-07-15 ENCOUNTER — Other Ambulatory Visit: Payer: Self-pay | Admitting: Dermatology

## 2023-07-15 DIAGNOSIS — L2081 Atopic neurodermatitis: Secondary | ICD-10-CM

## 2023-07-16 ENCOUNTER — Telehealth: Payer: Self-pay

## 2023-07-16 NOTE — Telephone Encounter (Signed)
Oked pharmacy to fill Cream instead of ointment. aw

## 2023-07-16 NOTE — Telephone Encounter (Signed)
Amy from [facility] called regarding refill requested that had been submitted yesterday for desoximetasone 0.25 % ointment for atopic neurodermatitis. Amy states their warehouse is currently out of ointment and would like to know if we could switch rx to cream instead or send in another similar ointment?   Please advise.

## 2023-07-21 ENCOUNTER — Other Ambulatory Visit: Payer: Self-pay | Admitting: Family Medicine

## 2023-07-22 NOTE — Telephone Encounter (Signed)
 Rx  04/01/23 #30 5RF- too soon Requested Prescriptions  Pending Prescriptions Disp Refills   diltiazem  (CARDIZEM  CD) 180 MG 24 hr capsule [Pharmacy Med Name: DILTIAZEM  HCL ER COATED BEADS 180 M] 30 capsule 5    Sig: TAKE 1 CAPSULE BY MOUTH ONCE DAILY     Cardiovascular: Calcium  Channel Blockers 3 Passed - 07/22/2023  1:53 PM      Passed - ALT in normal range and within 360 days    ALT  Date Value Ref Range Status  03/18/2023 20 0 - 44 IU/L Final   SGPT (ALT)  Date Value Ref Range Status  04/07/2014 24 U/L Final    Comment:    14-63 NOTE: New Reference Range 01/04/14          Passed - AST in normal range and within 360 days    AST  Date Value Ref Range Status  03/18/2023 20 0 - 40 IU/L Final   SGOT(AST)  Date Value Ref Range Status  04/07/2014 15 15 - 37 Unit/L Final         Passed - Cr in normal range and within 360 days    Creatinine  Date Value Ref Range Status  04/08/2014 1.23 0.60 - 1.30 mg/dL Final   Creatinine, Ser  Date Value Ref Range Status  03/18/2023 0.96 0.76 - 1.27 mg/dL Final         Passed - Last BP in normal range    BP Readings from Last 1 Encounters:  03/18/23 127/79         Passed - Last Heart Rate in normal range    Pulse Readings from Last 1 Encounters:  03/18/23 77         Passed - Valid encounter within last 6 months    Recent Outpatient Visits           4 months ago Encounter for annual physical exam   [facility] [name], [name], MD   1 year ago Annual physical exam   [name], MD   1 year ago Diverticulitis   [name], MD   1 year ago Cellulitis of lower extremity, unspecified laterality   [name], MD   1 year ago Cellulitis of lower extremity, unspecified laterality   [name], [name], PA-C        Future Appointments             In 1 week McGowan, Clotilda DELENA RIGGERS [facility] [place]   In 3 weeks Claudene Lehmann, MD [facility]   In 3 months Hester Alm BROCKS, MD [facility] [place] [facility]   In 8 months Bacigalupo, [name], MD [facility]

## 2023-07-29 NOTE — Progress Notes (Unsigned)
PTNS  Session # monthly maintenance  Health & Social Factors: No change  Caffeine: 2 Alcohol: 0 Daytime voids #per day: 5 Night-time voids #per night: 2 Urgency: None Incontinence Episodes #per day: 0 Ankle used: Left Treatment Setting: 7 Feeling/ Response: Sensory  Comments: Patient tolerated.   Performed By: Michiel Cowboy, PA-C   Follow Up: 1 month for maintenance PTNS

## 2023-07-31 ENCOUNTER — Ambulatory Visit: Payer: PPO | Admitting: Urology

## 2023-07-31 DIAGNOSIS — R350 Frequency of micturition: Secondary | ICD-10-CM

## 2023-08-12 ENCOUNTER — Ambulatory Visit: Payer: PPO | Admitting: Dermatology

## 2023-08-12 ENCOUNTER — Encounter: Payer: Self-pay | Admitting: Dermatology

## 2023-08-12 DIAGNOSIS — D2362 Other benign neoplasm of skin of left upper limb, including shoulder: Secondary | ICD-10-CM | POA: Diagnosis not present

## 2023-08-12 DIAGNOSIS — W908XXA Exposure to other nonionizing radiation, initial encounter: Secondary | ICD-10-CM | POA: Diagnosis not present

## 2023-08-12 DIAGNOSIS — D492 Neoplasm of unspecified behavior of bone, soft tissue, and skin: Secondary | ICD-10-CM | POA: Diagnosis not present

## 2023-08-12 DIAGNOSIS — L570 Actinic keratosis: Secondary | ICD-10-CM

## 2023-08-12 DIAGNOSIS — D0462 Carcinoma in situ of skin of left upper limb, including shoulder: Secondary | ICD-10-CM | POA: Diagnosis not present

## 2023-08-12 NOTE — Patient Instructions (Addendum)

## 2023-08-12 NOTE — Progress Notes (Signed)
 Follow-Up Visit   Subjective  [name] is a 83 y.o. male who presents for the following: place at R index finger had growth at end of it very painful that has now gone away but has no feeling and soreness behind it. Pt also has place at L dorsal hand that appeared x couple months ago.   The patient has spots, moles and lesions to be evaluated, some may be new or changing and the patient may have concern these could be cancer.   The following portions of the chart were reviewed this encounter and updated as appropriate: medications, allergies, medical history  Review of Systems:  No other skin or systemic complaints except as noted in HPI or Assessment and Plan.  Objective  Well appearing patient in no apparent distress; mood and affect are within normal limits.   A focused examination was performed of the following areas: Hands   Relevant exam findings are noted in the Assessment and Plan.  L hand dorsum 6 mm Keratotic papule  L hand medial near 1st MCP 18 mm Indurated pink plaque with hyperkeratosis  L hand dorsum x1 Pink scaly macules  Assessment & Plan    Lesion of concern Exam: keratotic papule at tip of  R second finger  Treatment Plan: Patient reports it's feeling better so recheck at follow up   NEOPLASM OF SKIN (2) L hand dorsum Skin / nail biopsy Type of biopsy: tangential   Informed consent: discussed and consent obtained   Timeout: patient name, date of birth, surgical site, and procedure verified   Procedure prep:  Patient was prepped and draped in usual sterile fashion Prep type:  Isopropyl alcohol Anesthesia: the lesion was anesthetized in a standard fashion   Anesthetic:  1% lidocaine w/ epinephrine 1-100,000 buffered w/ 8.4% NaHCO3 Instrument used: DermaBlade   Hemostasis achieved with: pressure and aluminum chloride   Outcome: patient tolerated procedure well   Post-procedure details: sterile dressing applied and wound care instructions  given   Dressing type: bandage and petrolatum   Specimen 2 - Surgical pathology Differential Diagnosis: AK vs SCC  Check Margins: No 6 mm Keratotic papule L hand medial near 1st MCP Skin / nail biopsy Type of biopsy: tangential   Informed consent: discussed and consent obtained   Timeout: patient name, date of birth, surgical site, and procedure verified   Procedure prep:  Patient was prepped and draped in usual sterile fashion Prep type:  Isopropyl alcohol Anesthesia: the lesion was anesthetized in a standard fashion   Anesthetic:  1% lidocaine w/ epinephrine 1-100,000 buffered w/ 8.4% NaHCO3 Instrument used: DermaBlade   Hemostasis achieved with: pressure and aluminum chloride   Outcome: patient tolerated procedure well   Post-procedure details: sterile dressing applied and wound care instructions given   Dressing type: bandage and petrolatum   Specimen 1 - Surgical pathology Differential Diagnosis: SCC  Check Margins: No 18 mm Indurated pink plaque with hyperkeratosis AK (ACTINIC KERATOSIS) L hand dorsum x1 Actinic keratoses are precancerous spots that appear secondary to cumulative UV radiation exposure/sun exposure over time. They are chronic with expected duration over 1 year. A portion of actinic keratoses will progress to squamous cell carcinoma of the skin. It is not possible to reliably predict which spots will progress to skin cancer and so treatment is recommended to prevent development of skin cancer.  Recommend daily broad spectrum sunscreen SPF 30+ to sun-exposed areas, reapply every 2 hours as needed.  Recommend staying in the shade or  wearing long sleeves, sun glasses (UVA+UVB protection) and wide brim hats (4-inch brim around the entire circumference of the hat). Call for new or changing lesions. Destruction of lesion - L hand dorsum x1 Complexity: simple   Destruction method: cryotherapy   Informed consent: discussed and consent obtained   Timeout:  patient  name, date of birth, surgical site, and procedure verified Lesion destroyed using liquid nitrogen: Yes   Region frozen until ice ball extended beyond lesion: Yes   Cryo cycles: 1 or 2. Outcome: patient tolerated procedure well with no complications   Post-procedure details: wound care instructions given    Return for As scheduled, w/ Dr. Gwen Pounds, [facility].  I, Soundra Pilon, CMA, am acting as scribe for Elie Goody, MD .   Documentation: I have reviewed the above documentation for accuracy and completeness, and I agree with the above.  Elie Goody, MD

## 2023-08-13 ENCOUNTER — Ambulatory Visit: Payer: PPO | Admitting: Dermatology

## 2023-08-15 LAB — SURGICAL PATHOLOGY

## 2023-08-20 ENCOUNTER — Telehealth: Payer: Self-pay

## 2023-08-20 MED ORDER — FLUOROURACIL 5 % EX CREA: TOPICAL_CREAM | Freq: Two times a day (BID) | CUTANEOUS | 1 refills | Status: AC

## 2023-08-20 NOTE — Telephone Encounter (Signed)
-----   Message from [facility] sent at 08/18/2023  9:17 AM EST ----- Diagnosis 1. Skin, L hand medial near 1st MCP :       ACQUIRED DIGITAL FIBROKERATOMA        2. Skin, L hand dorsum :       SQUAMOUS CELL CARCINOMA IN SITU     Please call with diagnosis and message me with patient's decision on treatment.  FOR 1:  Biopsy shows a benign overgrowth of fibrous tissue. Treatment is not required but the bump can be removed with surgery if bothersome.  FOR 2:   Explanation: This is a squamous cell skin cancer limited to the top layer of skin. This means it is an early cancer and has not spread. However, it has the potential to spread beyond the skin and threaten your health, so we recommend treating it.   Treatment option 1: a cream (fluorouracil and calcipotriene) that helps your immune system clear the skin cancer. It will cause redness and irritation. Wait two weeks after the biopsy to start applying the cream. Apply the cream twice per day until the redness and irritation develop (usually occurs by day 7), then stop and allow it to heal. We will recheck the area in 2 months to ensure the cancer is gone. The cream is $45 plus shipping and will be mailed to you from a low cost compounding pharmacy.  Treatment option 2: you return for a brief appointment where I perform electrodesiccation and curettage [facility]). This involves three rounds of scraping and burning to destroy the skin cancer. It has about an 85% cure rate and leaves a round wound slightly larger than the skin cancer and leaves a round white scar. No additional pathology is done. If the skin cancer comes back, we would need to do a surgery to remove it.  Treatment option 3: Mohs surgery, which involves cutting out right around the skin cancer and then checking under the microscope on the same day to ensure the whole skin cancer is out. If there is more cancer remaining, the surgeon will repeat the process until it is fully cleared.  The cure rate is about 98-99%. It is done at another office outside of [place] ([place], [place], or [place]). Once the Mohs surgeon confirms the skin cancer is out, they will discuss the options to repair or heal the area. You must take it easy for about two weeks after surgery (no lifting over 10-15 lbs, avoid activity to get your heart rate and blood pressure up).

## 2023-08-20 NOTE — Telephone Encounter (Signed)
 Advised pt of bx results.  Discussed treatment options for SCC IS.  Patient would like to treat with 5FU/Calcipotriene cream.  Sent 5FU/Calcipotriene cream to [facility]. Sent patient mychart message with directions for treatment, [facility] information and patient education of 5FU/Calcipotriene.  Discussed scheduling 44m follow up and patient said he is scheduled with Dr. Gwen Pounds for a follow up 11/13/23 and he will just have hand checked at that appointment.

## 2023-08-26 NOTE — Progress Notes (Unsigned)
 PTNS  Session # Monthly Maintenance   Health & Social Factors: No change Caffeine: 2 Alcohol: 0 Daytime voids #per day: 5 Night-time voids #per night: 3 Urgency: None Incontinence Episodes #per day: 0 Ankle used: Left Treatment Setting: 12 Feeling/ Response: Sensory Comments: Patient tolerated.  Performed By: Michiel Cowboy, PA-C   Follow Up: 1 month for maintenance PTNS

## 2023-08-28 ENCOUNTER — Ambulatory Visit (INDEPENDENT_AMBULATORY_CARE_PROVIDER_SITE_OTHER): Payer: PPO | Admitting: Urology

## 2023-08-28 ENCOUNTER — Other Ambulatory Visit: Payer: Self-pay | Admitting: *Deleted

## 2023-08-28 DIAGNOSIS — R350 Frequency of micturition: Secondary | ICD-10-CM

## 2023-08-28 DIAGNOSIS — R351 Nocturia: Secondary | ICD-10-CM

## 2023-08-28 MED ORDER — GEMTESA 75 MG PO TABS: 75.0000 mg | ORAL_TABLET | Freq: Every day | ORAL | 3 refills | Status: DC

## 2023-09-03 ENCOUNTER — Ambulatory Visit: Payer: PPO | Admitting: Podiatry

## 2023-09-03 ENCOUNTER — Encounter: Payer: Self-pay | Admitting: Podiatry

## 2023-09-03 DIAGNOSIS — M19071 Primary osteoarthritis, right ankle and foot: Secondary | ICD-10-CM | POA: Diagnosis not present

## 2023-09-03 MED ORDER — TRIAMCINOLONE ACETONIDE 40 MG/ML IJ SUSP
20.0000 mg | Freq: Once | INTRAMUSCULAR | Status: AC
Start: 2023-09-03 — End: 2023-09-03
  Administered 2023-09-03: 20 mg

## 2023-09-03 NOTE — Progress Notes (Signed)
 He presents today chief complaint of painful area circled on dorsal lateral aspect of his right foot.  States that the majority the pain is right heel but it does radiate out the fifth metatarsal.  Objective: Vital signs are stable alert oriented x 3 pulses are palpable.  Surgical area is the fourth fifth tarsometatarsal joint where he does have arthritis on radiographs.  Assessment: Osteoarthritis capsulitis midfoot.  Plan: Injected today 20 mg Kenalog 5 mg Marcaine point maximal tenderness.  Tolerated procedure well we will follow-up with him in 6 months

## 2023-09-08 ENCOUNTER — Other Ambulatory Visit: Payer: Self-pay | Admitting: Family Medicine

## 2023-09-12 ENCOUNTER — Other Ambulatory Visit: Payer: Self-pay | Admitting: Family Medicine

## 2023-09-12 NOTE — Telephone Encounter (Signed)
 Copied from CRM [id number]. Topic: Clinical - Medication Refill >> Sep 12, 2023  8:14 AM Elmarie Shiley B wrote: Most Recent Primary Care Visit:  Provider: Erasmo Downer  Department: [id number]  Visit Type: PHYSICAL  Date: 03/18/2023  Medication: omeprazole (PRILOSEC) 20 MG capsule  Has the patient contacted their pharmacy? Yes  (Agent: If yes, when and what did the pharmacy advise?) Contact PCP office because it was denied and request alternative.   Is this the correct pharmacy for this prescription? Yes  This is the patient's preferred pharmacy:  [facility] - [place], [place] - [address] [address] Phone: [phone] Fax: [phone]     Has the prescription been filled recently? Yes  Is the patient out of the medication? No  Has the patient been seen for an appointment in the last year OR does the patient have an upcoming appointment? Yes  Can we respond through MyChart? No  Agent: Please be advised that Rx refills may take up to 3 business days. We ask that you follow-up with your pharmacy.

## 2023-09-15 DIAGNOSIS — H401122 Primary open-angle glaucoma, left eye, moderate stage: Secondary | ICD-10-CM | POA: Diagnosis not present

## 2023-09-15 MED ORDER — OMEPRAZOLE 20 MG PO CPDR: 20.0000 mg | DELAYED_RELEASE_CAPSULE | Freq: Every day | ORAL | 0 refills | Status: DC

## 2023-09-15 NOTE — Telephone Encounter (Signed)
 Requested Prescriptions  Pending Prescriptions Disp Refills   omeprazole (PRILOSEC) 20 MG capsule 90 capsule 0    Sig: Take 1 capsule (20 mg total) by mouth daily.     Gastroenterology: Proton Pump Inhibitors Failed - 09/15/2023 10:42 AM      Failed - Valid encounter within last 12 months    Recent Outpatient Visits   None     Future Appointments             In 3 weeks McGowan, Elana Alm [facility] [place]   In 1 month Deirdre Evener, MD [facility]   In 6 months Bacigalupo, Marzella Schlein, MD [facility]

## 2023-09-22 ENCOUNTER — Other Ambulatory Visit: Payer: Self-pay | Admitting: Family Medicine

## 2023-09-22 DIAGNOSIS — Z961 Presence of intraocular lens: Secondary | ICD-10-CM | POA: Diagnosis not present

## 2023-09-22 DIAGNOSIS — H401113 Primary open-angle glaucoma, right eye, severe stage: Secondary | ICD-10-CM | POA: Diagnosis not present

## 2023-09-22 DIAGNOSIS — H353131 Nonexudative age-related macular degeneration, bilateral, early dry stage: Secondary | ICD-10-CM | POA: Diagnosis not present

## 2023-09-22 DIAGNOSIS — H401122 Primary open-angle glaucoma, left eye, moderate stage: Secondary | ICD-10-CM | POA: Diagnosis not present

## 2023-09-23 NOTE — Telephone Encounter (Signed)
 Requested Prescriptions  Pending Prescriptions Disp Refills   diltiazem (CARDIZEM CD) 180 MG 24 hr capsule [Pharmacy Med Name: DILTIAZEM HCL ER COATED BEADS 180 M] 30 capsule 2    Sig: TAKE 1 CAPSULE BY MOUTH ONCE DAILY     Cardiovascular: Calcium Channel Blockers 3 Failed - 09/23/2023  2:02 PM      Failed - Valid encounter within last 6 months    Recent Outpatient Visits   None     Future Appointments             In 2 weeks McGowan, Elana [facility] [place]   In 3 months Deirdre Evener, MD [place] [facility]   In 5 months Bacigalupo, Marzella Schlein, MD [facility]            Passed - ALT in normal range and within 360 days    ALT  Date Value Ref Range Status  03/18/2023 20 0 - 44 IU/L Final   SGPT (ALT)  Date Value Ref Range Status  04/07/2014 24 U/L Final    Comment:    14-63 NOTE: New Reference Range 01/04/14          Passed - AST in normal range and within 360 days    AST  Date Value Ref Range Status  03/18/2023 20 0 - 40 IU/L Final   SGOT(AST)  Date Value Ref Range Status  04/07/2014 15 15 - 37 Unit/L Final         Passed - Cr in normal range and within 360 days    Creatinine  Date Value Ref Range Status  04/08/2014 1.23 0.60 - 1.30 mg/dL Final   Creatinine, Ser  Date Value Ref Range Status  03/18/2023 0.96 0.76 - 1.27 mg/dL Final         Passed - Last BP in normal range    BP Readings from Last 1 Encounters:  03/18/23 127/79         Passed - Last Heart Rate in normal range    Pulse Readings from Last 1 Encounters:  03/18/23 77

## 2023-10-03 ENCOUNTER — Other Ambulatory Visit: Payer: Self-pay | Admitting: Family Medicine

## 2023-10-03 NOTE — Telephone Encounter (Signed)
 Requested Prescriptions  Pending Prescriptions Disp Refills   traZODone  (DESYREL ) 50 MG tablet [Pharmacy Med Name: TRAZODONE  HCL 50 MG TAB] 90 tablet 1    Sig: Take 0.5 tablets (25 mg total) by mouth at bedtime as needed for sleep.     Psychiatry: Antidepressants - Serotonin Modulator Failed - 10/03/2023  2:18 PM      Failed - Valid encounter within last 6 months    Recent Outpatient Visits   None     Future Appointments             In 6 days McGowan, Nyra Bellis [facility] [place]   In 1 month Elta Halter, [facility]   In 5 months Bacigalupo, Stan Eans, MD [facility]

## 2023-10-09 ENCOUNTER — Ambulatory Visit: Admitting: Urology

## 2023-10-09 DIAGNOSIS — R350 Frequency of micturition: Secondary | ICD-10-CM

## 2023-10-09 NOTE — Progress Notes (Signed)
 PTNS  Session # monthly maintenance  Health & Social Factors: No change Caffeine: 2 cups Alcohol: 0 Daytime voids #per day: 5 Night-time voids #per night: 3 Urgency: mild Incontinence Episodes #per day: 0 Ankle used: Left Treatment Setting: 5 Feeling/ Response: Sensory  Comments: Patient tolerated  Performed By: Matilde Son, PA-C   Follow Up: 1 month for PTNS maintenance

## 2023-10-14 DIAGNOSIS — I8290 Acute embolism and thrombosis of unspecified vein: Secondary | ICD-10-CM | POA: Diagnosis not present

## 2023-10-22 ENCOUNTER — Emergency Department

## 2023-10-22 ENCOUNTER — Emergency Department
Admission: EM | Admit: 2023-10-22 | Discharge: 2023-10-22 | Disposition: A | Discharge status: Home / Self Care | Attending: Emergency Medicine | Admitting: Emergency Medicine

## 2023-10-22 ENCOUNTER — Other Ambulatory Visit: Payer: Self-pay

## 2023-10-22 DIAGNOSIS — J449 Chronic obstructive pulmonary disease, unspecified: Secondary | ICD-10-CM | POA: Diagnosis not present

## 2023-10-22 DIAGNOSIS — R0789 Other chest pain: Secondary | ICD-10-CM | POA: Insufficient documentation

## 2023-10-22 DIAGNOSIS — R0602 Shortness of breath: Secondary | ICD-10-CM | POA: Insufficient documentation

## 2023-10-22 DIAGNOSIS — J189 Pneumonia, unspecified organism: Secondary | ICD-10-CM

## 2023-10-22 DIAGNOSIS — I10 Essential (primary) hypertension: Secondary | ICD-10-CM | POA: Insufficient documentation

## 2023-10-22 DIAGNOSIS — Z7901 Long term (current) use of anticoagulants: Secondary | ICD-10-CM | POA: Diagnosis not present

## 2023-10-22 DIAGNOSIS — I2510 Atherosclerotic heart disease of native coronary artery without angina pectoris: Secondary | ICD-10-CM | POA: Diagnosis not present

## 2023-10-22 DIAGNOSIS — J9811 Atelectasis: Secondary | ICD-10-CM | POA: Diagnosis not present

## 2023-10-22 DIAGNOSIS — R000 Tachycardia, unspecified: Secondary | ICD-10-CM | POA: Diagnosis not present

## 2023-10-22 DIAGNOSIS — J168 Pneumonia due to other specified infectious organisms: Secondary | ICD-10-CM | POA: Diagnosis not present

## 2023-10-22 DIAGNOSIS — R079 Chest pain, unspecified: Secondary | ICD-10-CM | POA: Diagnosis not present

## 2023-10-22 DIAGNOSIS — R918 Other nonspecific abnormal finding of lung field: Secondary | ICD-10-CM | POA: Diagnosis not present

## 2023-10-22 DIAGNOSIS — I723 Aneurysm of iliac artery: Secondary | ICD-10-CM | POA: Diagnosis not present

## 2023-10-22 LAB — COMPREHENSIVE METABOLIC PANEL WITH GFR
ALT: 27 U/L (ref 0–44)
AST: 25 U/L (ref 15–41)
Albumin: 3.2 g/dL — ABNORMAL LOW (ref 3.5–5.0)
Alkaline Phosphatase: 77 U/L (ref 38–126)
Anion gap: 7 (ref 5–15)
BUN: 10 mg/dL (ref 8–23)
CO2: 23 mmol/L (ref 22–32)
Calcium: 8.7 mg/dL — ABNORMAL LOW (ref 8.9–10.3)
Chloride: 102 mmol/L (ref 98–111)
Creatinine, Ser: 0.91 mg/dL (ref 0.61–1.24)
GFR, Estimated: >60 mL/min (ref >60)
Glucose, Bld: 110 mg/dL — ABNORMAL HIGH (ref 70–99)
Potassium: 4.1 mmol/L (ref 3.5–5.1)
Sodium: 132 mmol/L — ABNORMAL LOW (ref 135–145)
Total Bilirubin: 0.7 mg/dL (ref 0.0–1.2)
Total Protein: 5.9 g/dL — ABNORMAL LOW (ref 6.5–8.1)

## 2023-10-22 LAB — CBC WITH DIFFERENTIAL/PLATELET
Abs Immature Granulocytes: 0.06 10*3/uL (ref 0.00–0.07)
Basophils Absolute: 0.1 10*3/uL (ref 0.0–0.1)
Basophils Relative: 1 %
Eosinophils Absolute: 0.1 10*3/uL (ref 0.0–0.5)
Eosinophils Relative: 1 %
HCT: 44.3 % (ref 39.0–52.0)
Hemoglobin: 14.9 g/dL (ref 13.0–17.0)
Immature Granulocytes: 1 %
Lymphocytes Relative: 15 %
Lymphs Abs: 1.3 10*3/uL (ref 0.7–4.0)
MCH: 32.2 pg (ref 26.0–34.0)
MCHC: 33.6 g/dL (ref 30.0–36.0)
MCV: 95.7 fL (ref 80.0–100.0)
Monocytes Absolute: 1.1 10*3/uL — ABNORMAL HIGH (ref 0.1–1.0)
Monocytes Relative: 13 %
Neutro Abs: 6.1 10*3/uL (ref 1.7–7.7)
Neutrophils Relative %: 69 %
Platelets: 237 10*3/uL (ref 150–400)
RBC: 4.63 MIL/uL (ref 4.22–5.81)
RDW: 13.3 % (ref 11.5–15.5)
WBC: 8.8 10*3/uL (ref 4.0–10.5)
nRBC: 0 % (ref 0.0–0.2)

## 2023-10-22 LAB — LIPASE, BLOOD: Lipase: 24 U/L (ref 11–51)

## 2023-10-22 LAB — TROPONIN I (HIGH SENSITIVITY)
Troponin I (High Sensitivity): 7 ng/L (ref <18)
Troponin I (High Sensitivity): 6 ng/L (ref <18)

## 2023-10-22 MED ORDER — ASPIRIN 81 MG PO CHEW
324.0000 mg | CHEWABLE_TABLET | Freq: Once | ORAL | Status: AC
  Administered 2023-10-22: 324 mg via ORAL
  Filled 2023-10-22: qty 4

## 2023-10-22 MED ORDER — IOHEXOL 350 MG/ML SOLN
100.0000 mL | Freq: Once | INTRAVENOUS | Status: AC | PRN
  Administered 2023-10-22: 100 mL via INTRAVENOUS

## 2023-10-22 MED ORDER — LEVOFLOXACIN 750 MG PO TABS: 750.0000 mg | ORAL_TABLET | Freq: Every day | ORAL | 0 refills | Status: AC

## 2023-10-22 NOTE — Discharge Instructions (Addendum)
 Your workup is showing findings of possible pneumonia.  Take the antibiotic as prescribed and finish the full 1 week course.  Follow-up with your primary care provider.  Return to the ER immediately for new, worsening, or persistent severe chest pain, difficulty breathing, weakness, or any other new or worsening symptoms that concern you.  The CT scan of your chest and abdomen shows a few aneurysms which are dilated blood vessels in your abdomen.  These will need outpatient follow-up.  You should discuss this with your primary care provider.  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

## 2023-10-22 NOTE — ED Provider Notes (Signed)
 [facility] Provider Note    Event Date/Time   First MD Initiated Contact with Patient 10/22/23 0602     (approximate)   History   Chest Pain   HPI  [name] is a 83 y.o. male   Past medical history of DVT on Xarelto , CAD, COPD, hypertension hyperlipidemia presents emerged department with chest pain.  Has been bothering for 1 month but worsened this morning.  He describes the pain as a constant soreness sensation deep within his chest starting at the upper abdomen radiating up to the mid chest to the sternal notch.  At times the pain will ramp up on him and become quite severe momentarily and then subside spontaneously.  There is no pattern to the spikes of pain, it is not exertional, not worse with certain movements and can happen at rest as well.  He states that he has mild shortness of breath but this is not unusual given his baseline COPD.  He has had a mild increase in cough but without productive sputum, no fevers, no chills.  He has been fully compliant with his Xarelto .   External Medical Documents Reviewed: Hematology note from last week documenting a previous unprovoked DVT on Xarelto  currently, tolerating well.      Physical Exam   Triage Vital Signs: ED Triage Vitals [10/22/23 0602]  Encounter Vitals Group     BP      Systolic BP Percentile      Diastolic BP Percentile      Pulse      Resp 19     Temp      Temp src      SpO2      Weight 162 lb (73.5 kg)     Height 6\' 2"  (1.88 m)     Head Circumference      Peak Flow      Pain Score 5     Pain Loc      Pain Education      Exclude from Growth Chart     Most recent vital signs: Vitals:   10/22/23 0602 10/22/23 0607  BP:  (!) 149/87  Pulse:  (!) 119  Resp: 19 19  Temp:  97.9 F (36.6 C)  SpO2:  94%    General: Awake, no distress.  CV:  Good peripheral perfusion.  Resp:  Normal effort.  Abd:  No distention.  Other:  Pleasant gentleman sitting in stretcher  conversant in no acute distress.  His lungs are clear without focality or wheezing.  Heart sounds tachycardic but no obvious murmur.  No peripheral edema.  Radial pulses intact and equal bilaterally.  Abdomen is soft and nontender to palpation all quadrants.  Chest wall without tenderness.   ED Results / Procedures / Treatments   Labs (all labs ordered are listed, but only abnormal results are displayed) Labs Reviewed  COMPREHENSIVE METABOLIC PANEL WITH GFR  LIPASE, BLOOD  CBC WITH DIFFERENTIAL/PLATELET  TROPONIN I (HIGH SENSITIVITY)     EKG  ED ECG REPORT I, Buell Carmin, the attending physician, personally viewed and interpreted this ECG.   Date: 10/22/2023  EKG Time: 0606  Rate: 112  Rhythm: Junctional tachycardia  Axis: nl  Intervals:nl  ST&T Change: no stemi    RADIOLOGY I independently reviewed and interpreted this x-ray and I see no obvious focality pneumothorax I also reviewed radiologist's formal read.   PROCEDURES:  Critical Care performed: No  Procedures   MEDICATIONS ORDERED IN ED: Medications  aspirin  chewable tablet 324 mg (has no administration in time range)     IMPRESSION / MDM / ASSESSMENT AND PLAN / ED COURSE  I reviewed the triage vital signs and the nursing notes.                                Patient's presentation is most consistent with acute presentation with potential threat to life or bodily function.  Differential diagnosis includes, but is not limited to, ACS/unstable angina, PE, dissection, respiratory infection, COPD exacerbation   The patient is on the cardiac monitor to evaluate for evidence of arrhythmia and/or significant heart rate changes.  MDM:    Vague chest pain with occasional severe spikes in pain over the last 1 month worsening.  He has cardiac risk factors that he 83 years old and concern for ACS we will check a EKG which shows junctional tachycardia but no acute ischemic changes, will follow-up with serial  troponin testing.  Give aspirin .  He has compliant with Xarelto  and has had no breakthrough PEs in the past so I doubt PE.  He has no respiratory infectious symptoms, mild cough and shortness of breath related to his COPD and no focal opacities on chest x-ray and so I will hold on antibiotics as I doubt respiratory infection is causing his chief complaint of chest pressure.  He is tachycardic and hypertensive with chest pain rating down to the upper belly and up towards the sternal notch I consider dissection as well.  I am getting a CT angiogram dissection protocol.  His workup is pending at the time of signout.  Disposition depending on results as above and reassessment of patient's symptoms.        FINAL CLINICAL IMPRESSION(S) / ED DIAGNOSES   Final diagnoses:  Nonspecific chest pain     Rx / DC Orders   ED Discharge Orders     None        Note:  This document was prepared using Dragon voice recognition software and may include unintentional dictation errors.    Buell Carmin, MD 10/22/23 [phone]

## 2023-10-22 NOTE — ED Provider Notes (Signed)
-----------------------------------------   11:00 AM on 10/22/2023 -----------------------------------------  I took over care of this patient from Dr. Margery Sheets.  Workup overall is reassuring.  Troponins are negative x 2.  CMP shows no acute abnormalities.  CBC shows no leukocytosis or anemia.  CT angio of the chest/abdomen/pelvis shows a few abdominal artery aneurysms with no evidence of acute complication.  There are also findings compatible with pneumonia.  There is no evidence of aortic dissection or PE.  On reassessment the patient states he is feeling well.  He denies any chest pain at this time.  He states he would like to go home.  Given the overall negative workup, he is stable for discharge at this time.  I counseled him on the results.  I will treat empirically for pneumonia with a 1 week course of Levaquin .  I instructed him to follow-up with his primary care provider.  I also advised him on the presence of the aneurysms of the celiac and iliac arteries and the need for follow-up.  There is no indication for acute intervention.  I gave strict return precautions and he expressed understanding.   CT chest/abdomen/pelvis:  IMPRESSION:  *No evidence of aortic dissection or aneurysm.  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  *Aneurysmal dilatation of the right common iliac artery with  outpouching of the medial wall of the right common iliac artery  measuring 1.6 x 1 x 1.8 cm.  *Bilateral basilar infiltrates and atelectasis.  *Nodular parenchymal changes of the left lower lobe posteromedial  segment measuring 1.6 x 1.5 cm likely correlate with nodular  atelectasis and infiltrates.  *Intraluminal densities within the right lower lobe bronchi  consistent with mucous plugs.     Lind Repine, MD 10/22/23 1102

## 2023-10-22 NOTE — ED Notes (Signed)
Pt given urinal at this time 

## 2023-10-22 NOTE — ED Notes (Signed)
 Pt verbalizes understanding of discharge instructions. Opportunity for questioning and answers were provided. Pt discharged from ED to home.   ? ?

## 2023-10-22 NOTE — ED Triage Notes (Addendum)
 POV with CC of CP that has been ongoing x1 month but has worsened this am. Denies SOB, dizziness, and lightheadedness. A&Ox4 and ambulatory with steady gait to room 14.

## 2023-10-23 ENCOUNTER — Ambulatory Visit (INDEPENDENT_AMBULATORY_CARE_PROVIDER_SITE_OTHER): Admitting: Family Medicine

## 2023-10-23 ENCOUNTER — Encounter: Payer: Self-pay | Admitting: Family Medicine

## 2023-10-23 ENCOUNTER — Telehealth: Payer: Self-pay

## 2023-10-23 VITALS — BP 138/70 | HR 69 | Ht 72.0 in | Wt 159.7 lb

## 2023-10-23 DIAGNOSIS — Z86718 Personal history of other venous thrombosis and embolism: Secondary | ICD-10-CM | POA: Diagnosis not present

## 2023-10-23 DIAGNOSIS — I728 Aneurysm of other specified arteries: Secondary | ICD-10-CM | POA: Insufficient documentation

## 2023-10-23 DIAGNOSIS — I723 Aneurysm of iliac artery: Secondary | ICD-10-CM | POA: Diagnosis not present

## 2023-10-23 DIAGNOSIS — J189 Pneumonia, unspecified organism: Secondary | ICD-10-CM

## 2023-10-23 DIAGNOSIS — I10 Essential (primary) hypertension: Secondary | ICD-10-CM

## 2023-10-23 MED ORDER — RIVAROXABAN 10 MG PO TABS: 10.0000 mg | ORAL_TABLET | Freq: Every day | ORAL | 3 refills | Status: AC

## 2023-10-23 NOTE — Assessment & Plan Note (Signed)
 Blood pressure recorded at 138/80 mmHg, slightly elevated compared to his usual readings. Blood pressure control is crucial for aneurysm management. - Recheck blood pressure before leaving the clinic. - Continue current antihypertensive regimen.

## 2023-10-23 NOTE — Assessment & Plan Note (Signed)
 Small fusiform aneurysms identified on CTA, with the largest measuring 2 cm. Low rupture risk at this size. Monitoring for growth is essential. Risk factor management includes blood pressure control and smoking cessation. Serial imaging planned with a repeat scan in six months and annually if stable. - Order repeat CT scan of the abdomen in 56m to monitor aneurysm size. - Continue blood pressure management to reduce risk of aneurysm growth. - Advise against smoking.

## 2023-10-23 NOTE — Transitions of Care (Post Inpatient/ED Visit) (Signed)
   10/23/2023  Name: JOHNE TRAVERSE MRN: [id number] DOB: 11/12/1940  Today's TOC FU Call Status:    Attempted to reach the patient regarding the most recent Inpatient/ED visit.  Follow Up Plan: No further outreach attempts will be made at this time. We have been unable to contact the patient. Patient already seen in office Signature Darrall Ellison, LPN [facility] Nurse Health Advisor Direct Dial [phone]

## 2023-10-23 NOTE — Progress Notes (Signed)
 Established patient visit   Patient: [name]   DOB: Mar 17, 1941   83 y.o. Male  MRN: [id number] Visit Date: 10/23/2023  Today's healthcare provider: Aden Agreste, MD   Chief Complaint  Patient presents with   Follow-up    Patient is present for emergency room follow-up. Patient was seen at [facility] on 10/22/23 for chest pain. Imaging was completed. Patient reports his issue was resolved and has not had any chest pains since he was seen. Pt was given rx for levofloxcin 750 mg to be taken daily   Subjective    HPI HPI     Follow-up    Additional comments: Patient is present for emergency room follow-up. Patient was seen at [facility] on 10/22/23 for chest pain. Imaging was completed. Patient reports his issue was resolved and has not had any chest pains since he was seen. Pt was given rx for levofloxcin 750 mg to be taken daily      Last edited by Pasty Bongo, CMA on 10/23/2023 10:13 AM.       Discussed the use of AI scribe software for clinical note transcription with the patient, who gave verbal consent to proceed.  History of Present Illness   He experienced chest pain leading to an ER visit. Troponin tests were negative, and metabolic panel and CBC showed no acute abnormalities. A CT angiogram revealed pneumonia but no dissection or pulmonary embolism. He is on Levaquin  for pneumonia.  The CTA incidentally found small fusiform aneurysms of the celiac trunk and dilatation of the right common iliac artery, with the largest measuring two centimeters. His hepatic and splenic arteries are patent. He has hypertension, coronary artery disease, and a history of smoking.  His chest pain has improved, and he has not experienced it again. Breathing is good, and he tolerates Levaquin  well. He had a minor throat soreness from a peanut, which resolved by the next morning.  He is on Xarelto  10 mg daily for anticoagulation due to a history of unprovoked deep vein thrombosis in 2016. He  was briefly on Eliquis  but returned to Xarelto  in 2018 without issues.         Medications: Outpatient Medications Prior to Visit  Medication Sig   albuterol  (PROVENTIL  HFA;VENTOLIN  HFA) 108 (90 Base) MCG/ACT inhaler Inhale 2 puffs into the lungs every 6 (six) hours as needed for wheezing or shortness of breath.   albuterol  (PROVENTIL ) (2.5 MG/3ML) 0.083% nebulizer solution Take 2.5 mg by nebulization in the morning and at bedtime.   brimonidine  (ALPHAGAN ) 0.15 % ophthalmic solution Place 1 drop into both eyes 2 (two) times daily.   clobetasol  ointment (TEMOVATE ) 0.05 % Apply to affected arm twice a day x 7 days, Avoid applying to face, groin, and axilla. Use as directed. Long-term use can cause thinning of the skin.   Desoximetasone  (TOPICORT ) 0.25 % ointment APPLY TO ECZEMA OF UPPER ARM TWICE DAILYAS DIRECTED.   diltiazem  (CARDIZEM  CD) 180 MG 24 hr capsule TAKE 1 CAPSULE BY MOUTH ONCE DAILY   dorzolamide  (TRUSOPT ) 2 % ophthalmic solution Place 1 drop into both eyes 2 (two) times daily.    finasteride  (PROSCAR ) 5 MG tablet Take 1 tablet (5 mg total) by mouth daily.   fluorouracil  (EFUDEX ) 5 % cream Apply topically 2 (two) times daily. Bid to aa left dorsum hand until area gets red and irritated   ketoconazole  (NIZORAL ) 2 % shampoo    latanoprost  (XALATAN ) 0.005 % ophthalmic solution Place 1 drop into both  eyes at bedtime.    levofloxacin  (LEVAQUIN ) 750 MG tablet Take 1 tablet (750 mg total) by mouth daily for 7 days.   montelukast  (SINGULAIR ) 10 MG tablet Take 10 mg by mouth at bedtime.   omeprazole  (PRILOSEC) 20 MG capsule Take 1 capsule (20 mg total) by mouth daily.   Sodium Chloride , Inhalant, 7 % NEBU Take 1 vial by nebulization 2 (two) times daily.   tiotropium (SPIRIVA ) 18 MCG inhalation capsule Place 1 capsule (18 mcg total) into inhaler and inhale every evening.   traZODone  (DESYREL ) 50 MG tablet Take 0.5 tablets (25 mg total) by mouth at bedtime as needed for sleep.   Vibegron   (GEMTESA ) 75 MG TABS Take 1 tablet (75 mg total) by mouth daily.   [DISCONTINUED] XARELTO  10 MG TABS tablet Take 1 tablet (10 mg total) by mouth daily.   No facility-administered medications prior to visit.    Review of Systems     Objective    BP 138/70 (BP Location: Left Arm, Patient Position: Sitting, Cuff Size: Normal)   Pulse 69   Ht 6' (1.829 m)   Wt 159 lb 11.2 oz (72.4 kg)   SpO2 100%   BMI 21.66 kg/m    Physical Exam Vitals reviewed.  Constitutional:      General: He is not in acute distress.    Appearance: Normal appearance. He is not diaphoretic.  HENT:     Head: Normocephalic and atraumatic.  Eyes:     General: No scleral icterus.    Conjunctiva/sclera: Conjunctivae normal.  Cardiovascular:     Rate and Rhythm: Normal rate and regular rhythm.     Heart sounds: Normal heart sounds. No murmur heard. Pulmonary:     Effort: Pulmonary effort is normal. No respiratory distress.     Breath sounds: Rhonchi (faint, in bases b/l) present. No wheezing.  Musculoskeletal:     Cervical back: Neck supple.     Right lower leg: No edema.     Left lower leg: No edema.  Lymphadenopathy:     Cervical: No cervical adenopathy.  Skin:    General: Skin is warm and dry.     Findings: No rash.  Neurological:     Mental Status: He is alert and oriented to person, place, and time. Mental status is at baseline.  Psychiatric:        Mood and Affect: Mood normal.        Behavior: Behavior normal.      No results found for any visits on 10/23/23.  Assessment & Plan     Problem List Items Addressed This Visit       Cardiovascular and Mediastinum   Essential hypertension (Chronic)   Blood pressure recorded at 138/80 mmHg, slightly elevated compared to his usual readings. Blood pressure control is crucial for aneurysm management. - Recheck blood pressure before leaving the clinic. - Continue current antihypertensive regimen.      Relevant Medications   rivaroxaban   (XARELTO ) 10 MG TABS tablet   Aneurysm artery, celiac (HCC)   Small fusiform aneurysms identified on CTA, with the largest measuring 2 cm. Low rupture risk at this size. Monitoring for growth is essential. Risk factor management includes blood pressure control and smoking cessation. Serial imaging planned with a repeat scan in six months and annually if stable. - Order repeat CT scan of the abdomen in 43m to monitor aneurysm size. - Continue blood pressure management to reduce risk of aneurysm growth. - Advise against smoking.  Relevant Medications   rivaroxaban  (XARELTO ) 10 MG TABS tablet   Aneurysm artery, iliac common (HCC)   Small fusiform aneurysms identified on CTA, with the largest measuring 2 cm. Low rupture risk at this size. Monitoring for growth is essential. Risk factor management includes blood pressure control and smoking cessation. Serial imaging planned with a repeat scan in six months and annually if stable. - Order repeat CT scan of the abdomen in 67m to monitor aneurysm size. - Continue blood pressure management to reduce risk of aneurysm growth. - Advise against smoking.      Relevant Medications   rivaroxaban  (XARELTO ) 10 MG TABS tablet     Other   History of DVT (deep vein thrombosis)   Left proximal and distal DVT in 2016, unprovoked. On long-term anticoagulation with Xarelto , well-tolerated. Previously switched to Eliquis  temporarily but prefers Xarelto . - Refill Xarelto  prescription for one year, to be filled at [facility] on [address] - Monitor blood counts during annual labs to check for anemia.       Other Visit Diagnoses       Community acquired pneumonia of left lower lobe of lung    -  Primary           Pneumonia Pneumonia detected on CTA in the ER. Currently treated with Levaquin  for one week. No chest pain or breathing difficulties. Advised to avoid extreme physical activities due to potential tendon weakening from Levaquin . - Continue  Levaquin  for the full week as prescribed. - Advise against engaging in extreme physical activities while on Levaquin .       No follow-ups on file.       Aden Agreste, MD  [facility] [phone] (phone) [phone] (fax)  [facility]

## 2023-10-23 NOTE — Assessment & Plan Note (Signed)
 Left proximal and distal DVT in 2016, unprovoked. On long-term anticoagulation with Xarelto , well-tolerated. Previously switched to Eliquis  temporarily but prefers Xarelto . - Refill Xarelto  prescription for one year, to be filled at [facility] on [address] - Monitor blood counts during annual labs to check for anemia.

## 2023-10-27 ENCOUNTER — Other Ambulatory Visit: Payer: Self-pay | Admitting: Family Medicine

## 2023-10-30 ENCOUNTER — Emergency Department

## 2023-10-30 ENCOUNTER — Encounter: Payer: Self-pay | Admitting: Emergency Medicine

## 2023-10-30 ENCOUNTER — Other Ambulatory Visit: Payer: Self-pay

## 2023-10-30 ENCOUNTER — Emergency Department
Admission: EM | Admit: 2023-10-30 | Discharge: 2023-10-30 | Disposition: A | Discharge status: Home / Self Care | Attending: Emergency Medicine | Admitting: Emergency Medicine

## 2023-10-30 DIAGNOSIS — I10 Essential (primary) hypertension: Secondary | ICD-10-CM | POA: Insufficient documentation

## 2023-10-30 DIAGNOSIS — M791 Myalgia, unspecified site: Secondary | ICD-10-CM | POA: Insufficient documentation

## 2023-10-30 DIAGNOSIS — W19XXXA Unspecified fall, initial encounter: Secondary | ICD-10-CM | POA: Diagnosis not present

## 2023-10-30 DIAGNOSIS — R519 Headache, unspecified: Secondary | ICD-10-CM | POA: Insufficient documentation

## 2023-10-30 DIAGNOSIS — M25552 Pain in left hip: Secondary | ICD-10-CM | POA: Insufficient documentation

## 2023-10-30 DIAGNOSIS — I2510 Atherosclerotic heart disease of native coronary artery without angina pectoris: Secondary | ICD-10-CM | POA: Diagnosis not present

## 2023-10-30 DIAGNOSIS — S0990XA Unspecified injury of head, initial encounter: Secondary | ICD-10-CM | POA: Diagnosis not present

## 2023-10-30 DIAGNOSIS — S199XXA Unspecified injury of neck, initial encounter: Secondary | ICD-10-CM | POA: Diagnosis not present

## 2023-10-30 DIAGNOSIS — Z7901 Long term (current) use of anticoagulants: Secondary | ICD-10-CM | POA: Insufficient documentation

## 2023-10-30 DIAGNOSIS — M7918 Myalgia, other site: Secondary | ICD-10-CM

## 2023-10-30 DIAGNOSIS — Z043 Encounter for examination and observation following other accident: Secondary | ICD-10-CM | POA: Diagnosis not present

## 2023-10-30 MED ORDER — LIDOCAINE 5 % EX PTCH: 1.0000 | MEDICATED_PATCH | Freq: Two times a day (BID) | CUTANEOUS | 1 refills | Status: AC

## 2023-10-30 NOTE — ED Provider Notes (Signed)
 [facility] Provider Note    Event Date/Time   First MD Initiated Contact with Patient 10/30/23 [phone]     (approximate)   History   Fall   HPI  [name] is a 83 y.o. male who presents to the ED for evaluation of Fall   Review of PCP visit from 1 week ago.  Follow-up after an ED visit from the day prior where he was diagnosed with community-acquired pneumonia and discharged with Levaquin .  Otherwise history of HTN, CAD, unprovoked DVT on Xarelto .  Patient presents from home after reported mechanical fall this morning.  2 hours ago he reports wearing athletic shoes with good grip, he pivoted on hardwood floors and reports his feet getting stuck and causing him to fall onto his left side.  Does report striking his head lightly on a door frame, but minimizes this, primarily reporting left-sided hip pain.  He has been able to ambulate since this occurred.  Denies my offer of analgesia.  No syncope or preceding illnesses.  Reports completion of his Levaquin  course and feeling fine prior to this fall.   Physical Exam   Triage Vital Signs: ED Triage Vitals [10/30/23 0703]  Encounter Vitals Group     BP (!) 140/87     Systolic BP Percentile      Diastolic BP Percentile      Pulse Rate 89     Resp 17     Temp (!) 97.5 F (36.4 C)     Temp Source Oral     SpO2 95 %     Weight 160 lb (72.6 kg)     Height 6' (1.829 m)     Head Circumference      Peak Flow      Pain Score 5     Pain Loc      Pain Education      Exclude from Growth Chart     Most recent vital signs: Vitals:   10/30/23 0703  BP: (!) 140/87  Pulse: 89  Resp: 17  Temp: (!) 97.5 F (36.4 C)  SpO2: 95%    General: Awake, no distress.  Well-appearing, preferring to sit upright with a bedside wheelchair CV:  Good peripheral perfusion.  Resp:  Normal effort.  Abd:  No distention.  MSK:  No deformity noted.  Just posterior to the greater trochanter, near the sacrum on the left, has  some mild localizing tenderness to palpation. Neuro:  No focal deficits appreciated. Other:  No signs of trauma to the head, neck.  Palpation of all 4 extremities without evidence of deformity, tenderness or trauma aside from the tenderness mentioned above   ED Results / Procedures / Treatments   Labs (all labs ordered are listed, but only abnormal results are displayed) Labs Reviewed - No data to display  EKG   RADIOLOGY CT head interpreted by me without evidence of acute intracranial pathology CT cervical spine interpreted by me without evidence of fracture or dislocation CXR interpreted by me without evidence of acute cardiopulmonary pathology. Plain film of the pelvis and left hip interpreted by me without evidence of fracture or dislocation   Official radiology report(s): CT HEAD WO CONTRAST ( ) Result Date: 10/30/2023 CLINICAL DATA:  Fall.  Head trauma. EXAM: CT HEAD WITHOUT CONTRAST CT CERVICAL SPINE WITHOUT CONTRAST TECHNIQUE: Multidetector CT imaging of the head and cervical spine was performed following the standard protocol without intravenous contrast. Multiplanar CT image reconstructions of the cervical spine were  also generated. RADIATION DOSE REDUCTION: This exam was performed according to the departmental dose-optimization program which includes automated exposure control, adjustment of the mA and/or kV according to patient size and/or use of iterative reconstruction technique. COMPARISON:  No comparison studies available. FINDINGS: CT HEAD FINDINGS Brain: There is no evidence for acute hemorrhage, hydrocephalus, mass lesion, or abnormal extra-axial fluid collection. No definite CT evidence for acute infarction. Vascular: No hyperdense vessel or unexpected calcification. Skull: No evidence for fracture. No worrisome lytic or sclerotic lesion. Sinuses/Orbits: Trace air-fluid levels in the sphenoid sinuses bilaterally. No mastoid effusion. Visualized portions of the globes and  intraorbital fat are unremarkable. Other: None. CT CERVICAL SPINE FINDINGS Alignment: Normal. Skull base and vertebrae: No acute fracture. No primary bone lesion or focal pathologic process. Soft tissues and spinal canal: No prevertebral fluid or swelling. No visible canal hematoma. Disc levels: Loss of disc height with mild endplate degeneration noted C5-6 and C6-7. Facets are well aligned bilaterally with some facet degeneration in the upper cervical levels bilaterally. Upper chest: Negative. Other: None. IMPRESSION: 1. No acute intracranial abnormality. 2. Trace air-fluid levels in the sphenoid sinuses bilaterally. Findings could reflect acute sinusitis. 3. Degenerative changes in the cervical spine without acute fracture. Electronically Signed   By: Donnal Fusi M.D.   On: 10/30/2023 07:48   CT Cervical Spine Wo Contrast Result Date: 10/30/2023 CLINICAL DATA:  Fall.  Head trauma. EXAM: CT HEAD WITHOUT CONTRAST CT CERVICAL SPINE WITHOUT CONTRAST TECHNIQUE: Multidetector CT imaging of the head and cervical spine was performed following the standard protocol without intravenous contrast. Multiplanar CT image reconstructions of the cervical spine were also generated. RADIATION DOSE REDUCTION: This exam was performed according to the departmental dose-optimization program which includes automated exposure control, adjustment of the mA and/or kV according to patient size and/or use of iterative reconstruction technique. COMPARISON:  No comparison studies available. FINDINGS: CT HEAD FINDINGS Brain: There is no evidence for acute hemorrhage, hydrocephalus, mass lesion, or abnormal extra-axial fluid collection. No definite CT evidence for acute infarction. Vascular: No hyperdense vessel or unexpected calcification. Skull: No evidence for fracture. No worrisome lytic or sclerotic lesion. Sinuses/Orbits: Trace air-fluid levels in the sphenoid sinuses bilaterally. No mastoid effusion. Visualized portions of the globes  and intraorbital fat are unremarkable. Other: None. CT CERVICAL SPINE FINDINGS Alignment: Normal. Skull base and vertebrae: No acute fracture. No primary bone lesion or focal pathologic process. Soft tissues and spinal canal: No prevertebral fluid or swelling. No visible canal hematoma. Disc levels: Loss of disc height with mild endplate degeneration noted C5-6 and C6-7. Facets are well aligned bilaterally with some facet degeneration in the upper cervical levels bilaterally. Upper chest: Negative. Other: None. IMPRESSION: 1. No acute intracranial abnormality. 2. Trace air-fluid levels in the sphenoid sinuses bilaterally. Findings could reflect acute sinusitis. 3. Degenerative changes in the cervical spine without acute fracture. Electronically Signed   By: Donnal Fusi M.D.   On: 10/30/2023 07:48   DG Chest 2 View Result Date: 10/30/2023 CLINICAL DATA:  Patient fell onto his left side. EXAM: CHEST - 2 VIEW COMPARISON:  10/22/2023 FINDINGS: Lungs are hyperexpanded. Linear densities at the lung bases are similar to prior compatible with chronic atelectasis or scarring. No pneumothorax or pleural effusion. No pulmonary edema or focal lung consolidation. Cardiopericardial silhouette is at upper limits of normal for size. No acute bony abnormality. IMPRESSION: Hyperexpansion with chronic bibasilar atelectasis or scarring. No acute cardiopulmonary findings. Electronically Signed   By: Zack Hero.D.  On: 10/30/2023 07:39   DG HIP UNILAT W OR W/O PELVIS 2-3 VIEWS LEFT Result Date: 10/30/2023 CLINICAL DATA:  Status post fall.  Left hip pain. EXAM: DG HIP (WITH OR WITHOUT PELVIS) 2-3V LEFT COMPARISON:  None Available. FINDINGS: There is no evidence of hip fracture or dislocation. There is no evidence of arthropathy or other focal bone abnormality. IMPRESSION: Negative. Electronically Signed   By: Donnal Fusi M.D.   On: 10/30/2023 07:38    PROCEDURES and INTERVENTIONS:  Procedures  Medications - No data  to display   IMPRESSION / MDM / ASSESSMENT AND PLAN / ED COURSE  I reviewed the triage vital signs and the nursing notes.  Differential diagnosis includes, but is not limited to, ICH, skull or cervical fracture, pelvic ring fracture, sacral fracture, muscular pain or bruising  {Patient presents with symptoms of an acute illness or injury that is potentially life-threatening.  Well-appearing 83 year old who is anticoagulated presents after mechanical fall.  No indication for serum workup.  Look systemically well.  Declining my offer for analgesia.  We will obtain imaging of the head and neck due to his fall, striking head on the door frame and anticoagulation.  Will obtain plain films of the chest, pelvis and left hip.  Clinical Course as of 10/30/23 0759  Thu Oct 30, 2023  0757 Reassessed and discussed reassuring imaging.  I performed an ambulation trial.  He stands independently from the wheelchair, walked out of his room into the hallway and then sits back down.  Looks comfortable.  Normal gait.  We discussed expectant management, care at home and return precautions. [DS]    Clinical Course User Index [DS] Arline Bennett, MD     FINAL CLINICAL IMPRESSION(S) / ED DIAGNOSES   Final diagnoses:  Fall, initial encounter  Left hip pain  Musculoskeletal pain     Rx / DC Orders   ED Discharge Orders          Ordered    lidocaine  (LIDODERM ) 5 %  Every 12 hours        10/30/23 0758             Note:  This document was prepared using Dragon voice recognition software and may include unintentional dictation errors.   Arline Bennett, MD 10/30/23 0800

## 2023-10-30 NOTE — ED Triage Notes (Signed)
 Patient to ED via POV after a mechanical fall this AM. PT reports falling on left side. States he did hit his head- on blood thinners, denies LOC. C/o left hip pain.

## 2023-10-30 NOTE — Discharge Instructions (Signed)
Use Tylenol for pain and fevers.  Up to 1000 mg per dose, up to 4 times per day.  Do not take more than 4000 mg of Tylenol/acetaminophen within 24 hours.. ° °Please use lidocaine patches at your site of pain.  Apply 1 patch at a time, leave on for 12 hours, then remove for 12 hours.  12 hours on, 12 hours off.  Do not apply more than 1 patch at a time. ° °

## 2023-10-30 NOTE — ED Notes (Signed)
 See triage note  Presents with s/p fall  States lost balance  Landed on left side Having pain mainly to left hip area  States he did hit his head No LOC

## 2023-11-06 ENCOUNTER — Ambulatory Visit: Admitting: Urology

## 2023-11-06 DIAGNOSIS — R350 Frequency of micturition: Secondary | ICD-10-CM | POA: Diagnosis not present

## 2023-11-06 DIAGNOSIS — R351 Nocturia: Secondary | ICD-10-CM

## 2023-11-06 NOTE — Progress Notes (Signed)
 PTNS  Session # monthly maintenance  Health & Social Factors: No change Caffeine: 2 Alcohol: 0 Daytime voids #per day: 5 Night-time voids #per night: 3 Urgency: Mild Incontinence Episodes #per day: 0 Ankle used: Left Treatment Setting: 1 Feeling/ Response: Sensory  Comments: Patient tolerated  Performed By: Matilde Son, PA-C   Follow Up: 1 month for monthly maintenance

## 2023-11-13 ENCOUNTER — Ambulatory Visit: Payer: PPO | Admitting: Dermatology

## 2023-11-26 ENCOUNTER — Other Ambulatory Visit: Payer: Self-pay

## 2023-11-26 ENCOUNTER — Emergency Department: Admission: EM | Admit: 2023-11-26 | Discharge: 2023-11-26 | Disposition: A | Discharge status: Home / Self Care

## 2023-11-26 DIAGNOSIS — Z7901 Long term (current) use of anticoagulants: Secondary | ICD-10-CM | POA: Insufficient documentation

## 2023-11-26 DIAGNOSIS — S59911A Unspecified injury of right forearm, initial encounter: Secondary | ICD-10-CM | POA: Diagnosis present

## 2023-11-26 DIAGNOSIS — S51811A Laceration without foreign body of right forearm, initial encounter: Secondary | ICD-10-CM | POA: Insufficient documentation

## 2023-11-26 DIAGNOSIS — W208XXA Other cause of strike by thrown, projected or falling object, initial encounter: Secondary | ICD-10-CM | POA: Insufficient documentation

## 2023-11-26 LAB — CBC
HCT: 45.3 % (ref 39.0–52.0)
Hemoglobin: 15.2 g/dL (ref 13.0–17.0)
MCH: 32.9 pg (ref 26.0–34.0)
MCHC: 33.6 g/dL (ref 30.0–36.0)
MCV: 98.1 fL (ref 80.0–100.0)
Platelets: 246 10*3/uL (ref 150–400)
RBC: 4.62 MIL/uL (ref 4.22–5.81)
RDW: 13.5 % (ref 11.5–15.5)
WBC: 10.8 10*3/uL — ABNORMAL HIGH (ref 4.0–10.5)
nRBC: 0 % (ref 0.0–0.2)

## 2023-11-26 LAB — BASIC METABOLIC PANEL WITH GFR
Anion gap: 10 (ref 5–15)
BUN: 15 mg/dL (ref 8–23)
CO2: 20 mmol/L — ABNORMAL LOW (ref 22–32)
Calcium: 8.9 mg/dL (ref 8.9–10.3)
Chloride: 107 mmol/L (ref 98–111)
Creatinine, Ser: 1.01 mg/dL (ref 0.61–1.24)
GFR, Estimated: >60 mL/min (ref >60)
Glucose, Bld: 107 mg/dL — ABNORMAL HIGH (ref 70–99)
Potassium: 4.2 mmol/L (ref 3.5–5.1)
Sodium: 137 mmol/L (ref 135–145)

## 2023-11-26 LAB — PROTIME-INR
INR: 1.2 (ref 0.8–1.2)
Prothrombin Time: 15.2 s (ref 11.4–15.2)

## 2023-11-26 MED ORDER — BACITRACIN ZINC 500 UNIT/GM EX OINT
TOPICAL_OINTMENT | Freq: Once | CUTANEOUS | Status: AC
  Filled 2023-11-26: qty 0.9

## 2023-11-26 MED ORDER — LIDOCAINE-EPINEPHRINE 2 %-1:100000 IJ SOLN
20.0000 mL | Freq: Once | INTRAMUSCULAR | Status: AC
  Administered 2023-11-26: 20 mL

## 2023-11-26 NOTE — ED Notes (Signed)
Wound irrigated.

## 2023-11-26 NOTE — ED Provider Notes (Signed)
 [facility] Provider Note    Event Date/Time   First MD Initiated Contact with Patient 11/26/23 1714     (approximate)   History   Arm Injury  Pt to ED via POV from home. Pt was working on truck and hit his right forearm on hitch. Pt has large laceration and with pooled blood clot. This RN and EDT cleaning wound with some active bleeding noted. No LOC. On xarelto .    HPI [name] is a 83 y.o. male multiple medical comorbidities including prior DVT on chronic anticoagulation presents for evaluation of a right forearm laceration -Patient was unloading a Rototiller from the back of a truck at about 3 PM when the lid fell off and hit him on the right forearm.  Has had ongoing bleeding since then so came to the emergency department for eval.  He is on Xarelto , last dose yesterday evening. -No other injuries     Physical Exam   Triage Vital Signs: ED Triage Vitals  Encounter Vitals Group     BP --      Systolic BP Percentile --      Diastolic BP Percentile --      Pulse Rate 11/26/23 1650 95     Resp 11/26/23 1650 16     Temp 11/26/23 1650 98.9 F (37.2 C)     Temp Source 11/26/23 1650 Oral     SpO2 11/26/23 1650 96 %     Weight --      Height --      Head Circumference --      Peak Flow --      Pain Score 11/26/23 1649 5     Pain Loc --      Pain Education --      Exclude from Growth Chart --     Most recent vital signs: Vitals:   11/26/23 1650  Pulse: 95  Resp: 16  Temp: 98.9 F (37.2 C)  SpO2: 96%     General: Awake, no distress.  CV:  Good peripheral perfusion.RP 2+ RUE Resp:  Normal effort.  RUE:  Large overlying clot on wound removed, 6cm laceration to dorsal forearm, gaping of underlying subcutaneous tissue on the lateral aspect, primarily just skin avulsion on the medial aspect.  Some oozing though no pulsatile bleeding.  No foreign bodies appreciated.  Radian/median/ulnar motor intact, RP 2+.  Full flexion extension of all  digits at PIP and DIP.  No underlying fracture or tendinous injury appreciated.  Wound explored through full range of motion in bloodless field.   ED Results / Procedures / Treatments   Labs (all labs ordered are listed, but only abnormal results are displayed) Labs Reviewed  CBC - Abnormal; Notable for the following components:      Result Value   WBC 10.8 (*)    All other components within normal limits  BASIC METABOLIC PANEL WITH GFR - Abnormal; Notable for the following components:   CO2 20 (*)    Glucose, Bld 107 (*)    All other components within normal limits  PROTIME-INR     EKG  N/a   RADIOLOGY N/a    PROCEDURES:  Critical Care performed: No  .Laceration Repair  Date/Time: 11/26/2023 6:10 PM  Performed by: Collis Deaner, MD Authorized by: Collis Deaner, MD   Consent:    Consent obtained:  Verbal   Consent given by:  Patient   Risks, benefits, and alternatives were discussed: yes  Risks discussed:  Infection, pain, retained foreign body, need for additional repair, poor cosmetic result, tendon damage, vascular damage, poor wound healing and nerve damage   Alternatives discussed:  No treatment and delayed treatment Universal protocol:    Patient identity confirmed:  Verbally with patient and arm band Anesthesia:    Anesthesia method:  Local infiltration   Local anesthetic:  Lidocaine  1% WITH epi Laceration details:    Location:  Shoulder/arm   Shoulder/arm location:  R lower arm   Length (cm):  6 Pre-procedure details:    Preparation:  Patient was prepped and draped in usual sterile fashion Exploration:    Hemostasis achieved with:  Direct pressure   Wound exploration: wound explored through full range of motion and entire depth of wound visualized     Wound extent: no foreign body, no signs of injury, no nerve damage, no tendon damage, no underlying fracture and no vascular damage     Contaminated: no   Treatment:    Area cleansed with:   Saline   Amount of cleaning:  Extensive   Irrigation solution:  Sterile saline   Irrigation volume:  1000   Irrigation method:  Pressure wash   Visualized foreign bodies/material removed: no     Debridement:  Minimal   Layers/structures repaired:  Deep subcutaneous Deep subcutaneous:    Suture size:  4-0   Suture material:  Vicryl   Suture technique:  Simple interrupted   Number of sutures:  2 Skin repair:    Repair method:  Sutures   Suture size:  4-0   Suture material:  Nylon   Suture technique:  Simple interrupted   Number of sutures:  7 Approximation:    Approximation:  Loose Repair type:    Repair type:  Intermediate Post-procedure details:    Dressing:  Antibiotic ointment and non-adherent dressing   Procedure completion:  Tolerated well, no immediate complications    MEDICATIONS ORDERED IN ED: Medications  bacitracin ointment (has no administration in time range)  lidocaine -EPINEPHrine  (XYLOCAINE  W/EPI) 2 %-1:100000 (with pres) injection 20 mL (has no administration in time range)     IMPRESSION / MDM / ASSESSMENT AND PLAN / ED COURSE  I reviewed the triage vital signs and the nursing notes.                              DDX/MDM/AP: Differential diagnosis includes, but is not limited to, simple skin avulsion/laceration.  Wound irrigated extensively with no foreign bodies appreciated, not particularly deep though will require some closure of subcutaneous tissues.  No concern for underlying vascular, nervous, tendinous injury at this time and no concern for retained foreign body on my eval.  Plan for laceration repair.  Plan: - Wound repair - Last Tdap 2022 per chart review, no indication for repeat  Patient's presentation is most consistent with acute, uncomplicated illness.    ED course below.  Wound closed with 2 deep absorbable sutures and 7 superficial sutures, plan for suture removal in 7 days.  Bacitracin applied, wound dressed.  Discharged home with  plan for outpatient follow-up.  ED return precautions placed.  Patient agrees with plan.      FINAL CLINICAL IMPRESSION(S) / ED DIAGNOSES   Final diagnoses:  Forearm laceration, right, initial encounter     Rx / DC Orders   ED Discharge Orders     None        Note:  This document was  prepared using Conservation officer, historic buildings and may include unintentional dictation errors.   Collis Deaner, MD 11/26/23 [phone]

## 2023-11-26 NOTE — Discharge Instructions (Signed)
 Your evaluation in the emergency department was overall reassuring.  We placed 2 deep absorbable sutures and 7 superficial nonabsorbable sutures--the 7 sutures will need to be removed in about 7 days.  Please follow-up with your primary care provider for this.  Return to the emergency department with any new or worsening symptoms.

## 2023-11-26 NOTE — ED Notes (Signed)
 Placed non adherent bandage and wrapped lightly with coban

## 2023-11-26 NOTE — ED Triage Notes (Signed)
 Pt to ED via POV from home. Pt was working on truck and hit his right forearm on hitch. Pt has large laceration and with pooled blood clot. This RN and EDT cleaning wound with some active bleeding noted. No LOC. On xarelto .

## 2023-11-26 NOTE — ED Notes (Signed)
 Pt noted to be bleeding through bandage.

## 2023-11-26 NOTE — ED Provider Triage Note (Addendum)
 Emergency Medicine Provider Triage Evaluation Note  [name] , a 83 y.o. male  was evaluated in triage.  Pt complains of cutting his right arm.Patien is taking blood thinners. See media  Review of Systems  Positive:  Negative:   Physical Exam  Pulse 95   Temp 98.9 F (37.2 C) (Oral)   Resp 16   SpO2 96%  Gen:   Awake, no distress   Resp:  Normal effort , no SOB MSK:   Moves extremities without difficulty. Right arm: Presence of cloth plus some bleeding at thel level of the medial third in the medial are of right forearm. Full ROM Other:    Medical Decision Making  Medically screening exam initiated at 4:58 PM.  Appropriate orders placed.  Ernestina Headland was informed that the remainder of the evaluation will be completed by another provider, this initial triage assessment does not replace that evaluation, and the importance of remaining in the ED until their evaluation is complete.  Patient with right arm laceration, taking blood thinners. I did cleaned arm with nurse help. Remove some cloth but the cloth on top of laceration is there. Ordered CBC, CMP. Patient is stable. Vital signs are  normal   Awilda Lennox, PA-C 11/26/23 1707    Awilda Lennox, PA-C 11/26/23 1707    Awilda Lennox, PA-C 11/26/23 1740

## 2023-11-27 ENCOUNTER — Ambulatory Visit: Admitting: Dermatology

## 2023-11-27 DIAGNOSIS — L578 Other skin changes due to chronic exposure to nonionizing radiation: Secondary | ICD-10-CM | POA: Diagnosis not present

## 2023-11-27 DIAGNOSIS — W908XXA Exposure to other nonionizing radiation, initial encounter: Secondary | ICD-10-CM

## 2023-11-27 DIAGNOSIS — L80 Vitiligo: Secondary | ICD-10-CM | POA: Diagnosis not present

## 2023-11-27 DIAGNOSIS — L820 Inflamed seborrheic keratosis: Secondary | ICD-10-CM | POA: Diagnosis not present

## 2023-11-27 DIAGNOSIS — D0462 Carcinoma in situ of skin of left upper limb, including shoulder: Secondary | ICD-10-CM

## 2023-11-27 DIAGNOSIS — L814 Other melanin hyperpigmentation: Secondary | ICD-10-CM | POA: Diagnosis not present

## 2023-11-27 DIAGNOSIS — Z85828 Personal history of other malignant neoplasm of skin: Secondary | ICD-10-CM

## 2023-11-27 DIAGNOSIS — Z872 Personal history of diseases of the skin and subcutaneous tissue: Secondary | ICD-10-CM

## 2023-11-27 DIAGNOSIS — Z79899 Other long term (current) drug therapy: Secondary | ICD-10-CM

## 2023-11-27 DIAGNOSIS — D1801 Hemangioma of skin and subcutaneous tissue: Secondary | ICD-10-CM

## 2023-11-27 DIAGNOSIS — D229 Melanocytic nevi, unspecified: Secondary | ICD-10-CM

## 2023-11-27 DIAGNOSIS — Z8589 Personal history of malignant neoplasm of other organs and systems: Secondary | ICD-10-CM

## 2023-11-27 DIAGNOSIS — D692 Other nonthrombocytopenic purpura: Secondary | ICD-10-CM | POA: Diagnosis not present

## 2023-11-27 DIAGNOSIS — Z1283 Encounter for screening for malignant neoplasm of skin: Secondary | ICD-10-CM | POA: Diagnosis not present

## 2023-11-27 DIAGNOSIS — Z5111 Encounter for antineoplastic chemotherapy: Secondary | ICD-10-CM

## 2023-11-27 DIAGNOSIS — L821 Other seborrheic keratosis: Secondary | ICD-10-CM | POA: Diagnosis not present

## 2023-11-27 DIAGNOSIS — L570 Actinic keratosis: Secondary | ICD-10-CM | POA: Diagnosis not present

## 2023-11-27 DIAGNOSIS — D099 Carcinoma in situ, unspecified: Secondary | ICD-10-CM

## 2023-11-27 DIAGNOSIS — Z7189 Other specified counseling: Secondary | ICD-10-CM

## 2023-11-27 NOTE — Progress Notes (Signed)
 Follow-Up Visit   Subjective  [name] is a 83 y.o. male who presents for the following: Skin Cancer Screening and Full Body Skin Exam Hx of scc, hx of sccis, hx of bcc, hx of aks and isk,   Crusty at scalp that wont go away  Spots at back of hands, right earlobe   The patient presents for Total-Body Skin Exam (TBSE) for skin cancer screening and mole check. The patient has spots, moles and lesions to be evaluated, some may be new or changing and the patient may have concern these could be cancer.  The following portions of the chart were reviewed this encounter and updated as appropriate: medications, allergies, medical history  Review of Systems:  No other skin or systemic complaints except as noted in HPI or Assessment and Plan.  Objective  Well appearing patient in no apparent distress; mood and affect are within normal limits.  A full examination was performed including scalp, head, eyes, ears, nose, lips, neck, chest, axillae, abdomen, back, buttocks, bilateral upper extremities, bilateral lower extremities, hands, feet, fingers, toes, fingernails, and toenails. All findings within normal limits unless otherwise noted below.   Relevant physical exam findings are noted in the Assessment and Plan.  Scalp x 4 (4) Erythematous stuck-on, waxy papule or plaque b/l ears and face 19 (19) Erythematous thin papules/macules with gritty scale.   Assessment & Plan   SKIN CANCER SCREENING PERFORMED TODAY.  ACTINIC DAMAGE - Chronic condition, secondary to cumulative UV/sun exposure - diffuse scaly erythematous macules with underlying dyspigmentation - Recommend daily broad spectrum sunscreen SPF 30+ to sun-exposed areas, reapply every 2 hours as needed.  - Staying in the shade or wearing long sleeves, sun glasses (UVA+UVB protection) and wide brim hats (4-inch brim around the entire circumference of the hat) are also recommended for sun protection.  - Call for new or changing  lesions.  LENTIGINES, SEBORRHEIC KERATOSES, HEMANGIOMAS - Benign normal skin lesions - Benign-appearing - Call for any changes  Purpura - Chronic; persistent and recurrent.  Treatable, but not curable. - Violaceous macules and patches - Benign - Related to trauma, age, sun damage and/or use of blood thinners, chronic use of topical and/or oral steroids - Observe - Can use OTC arnica containing moisturizer such as Dermend Bruise Formula if desired - Call for worsening or other concerns  VITILIGO Exam: depigmented patches on chest  Vitiligo is a chronic autoimmune condition which causes loss of skin pigment and is commonly seen on the face and may also involve areas of trauma like hands, elbows, knees, and ankles. There is no cure and it is difficult to treat.  Treatments include topical steroids and other topical anti-inflammatory ointments/creams and topical and oral Jak inhibitors.  Sometimes narrow band UV light therapy or Xtrac laser is helpful, both of which require twice weekly treatments for at least 3-6 months.  Antioxidant vitamins, such as Vitamins A,C,E,D, Folic Acid  and B12 may be added to enhance treatment. Heliocare may also enhance treatment results. Treatment Plan: No recommended treatment   MELANOCYTIC NEVI - Tan-brown and/or pink-flesh-colored symmetric macules and papules - Benign appearing on exam today - Observation - Call clinic for new or changing moles - Recommend daily use of broad spectrum spf 30+ sunscreen to sun-exposed areas.   HISTORY OF SQUAMOUS CELL CARCINOMA OF THE SKIN 02/19/2023 right dorsal hand WD SCC Mohs 03/13/2023 - No evidence of recurrence today - No lymphadenopathy - Recommend regular full body skin exams - Recommend daily broad  spectrum sunscreen SPF 30+ to sun-exposed areas, reapply every 2 hours as needed.  - Call if any new or changing lesions are noted between office visits   HISTORY OF BASAL CELL CARCINOMA OF THE SKIN 02/26/2018 left  lateral mid back  - No evidence of recurrence today - Recommend regular full body skin exams - Recommend daily broad spectrum sunscreen SPF 30+ to sun-exposed areas, reapply every 2 hours as needed.  - Call if any new or changing lesions are noted between office visits  INFLAMED SEBORRHEIC KERATOSIS (4) Scalp x 4 (4) Symptomatic, irritating, patient would like treated. Destruction of lesion - Scalp x 4 (4) Complexity: simple   Destruction method: cryotherapy   Informed consent: discussed and consent obtained   Timeout:  patient name, date of birth, surgical site, and procedure verified Lesion destroyed using liquid nitrogen: Yes   Region frozen until ice ball extended beyond lesion: Yes   Outcome: patient tolerated procedure well with no complications   Post-procedure details: wound care instructions given   ACTINIC KERATOSIS (19) b/l ears and face 19 (19) Actinic keratoses are precancerous spots that appear secondary to cumulative UV radiation exposure/sun exposure over time. They are chronic with expected duration over 1 year. A portion of actinic keratoses will progress to squamous cell carcinoma of the skin. It is not possible to reliably predict which spots will progress to skin cancer and so treatment is recommended to prevent development of skin cancer.  Recommend daily broad spectrum sunscreen SPF 30+ to sun-exposed areas, reapply every 2 hours as needed.  Recommend staying in the shade or wearing long sleeves, sun glasses (UVA+UVB protection) and wide brim hats (4-inch brim around the entire circumference of the hat). Call for new or changing lesions. Destruction of lesion - b/l ears and face 19 (19) Complexity: simple   Destruction method: cryotherapy   Informed consent: discussed and consent obtained   Timeout:  patient name, date of birth, surgical site, and procedure verified Lesion destroyed using liquid nitrogen: Yes   Region frozen until ice ball extended beyond lesion:  Yes   Outcome: patient tolerated procedure well with no complications   Post-procedure details: wound care instructions given   SQUAMOUS CELL CARCINOMA IN SITU left hand dorsum x 1 Destruction of lesion Complexity: simple   Destruction method: cryotherapy   Informed consent: discussed and consent obtained   Timeout:  patient name, date of birth, surgical site, and procedure verified Lesion destroyed using liquid nitrogen: Yes   Region frozen until ice ball extended beyond lesion: Yes   Final wound size (cm):  0.6 Outcome: patient tolerated procedure well with no complications   Post-procedure details: wound care instructions given    Destruction of lesion Bx proven SCCIS at left hand dorsum - refer to previous pathology 08/12/23 Accession: [id number]  Discussed treatment options discussed prior were 1) fluorouracil  / calcipotriene cream 2) ED&C or 3) Mohs  Patient preferred topical treatment Patient used fluorouracil  / calcipotriene cream to area twice daily for 7 days.   Today persistent sccis recommend LN2  Patient advised to restart fluorouracil  / calcipotriene cream to area twice daily for  2 week on January 16, 2024  Will recheck at next follow up Return in about 9 months (around 08/26/2024) for ubse .  IRandee Busing, CMA, am acting as scribe for Celine Collard, MD.   Documentation: I have reviewed the above documentation for accuracy and completeness, and I agree with the above.  Celine Collard, MD

## 2023-11-27 NOTE — Patient Instructions (Addendum)
 Start Cream August 1  Recommend restarting  fluorouracil  / calcipotriene cream twice daily for 2 weeks to left back of hand   5-Fluorouracil /Calcipotriene Patient Education   Actinic keratoses are the dry, red scaly spots on the skin caused by sun damage. A portion of these spots can turn into skin cancer with time, and treating them can help prevent development of skin cancer.   Treatment of these spots requires removal of the defective skin cells. There are various ways to remove actinic keratoses, including freezing with liquid nitrogen, treatment with creams, or treatment with a blue light procedure in the office.   5-fluorouracil  cream is a topical cream used to treat actinic keratoses. It works by interfering with the growth of abnormal fast-growing skin cells, such as actinic keratoses. These cells peel off and are replaced by healthy ones.   5-fluorouracil /calcipotriene is a combination of the 5-fluorouracil  cream with a vitamin D analog cream called calcipotriene. The calcipotriene alone does not treat actinic keratoses. However, when it is combined with 5-fluorouracil , it helps the 5-fluorouracil  treat the actinic keratoses much faster so that the same results can be achieved with a much shorter treatment time.  INSTRUCTIONS FOR 5-FLUOROURACIL /CALCIPOTRIENE CREAM:   5-fluorouracil /calcipotriene cream typically only needs to be used for 4-7 days. A thin layer should be applied twice a day to the treatment areas recommended by your physician.   If your physician prescribed you separate tubes of 5-fluourouracil and calcipotriene, apply a thin layer of 5-fluorouracil  followed by a thin layer of calcipotriene.   Avoid contact with your eyes, nostrils, and mouth. Do not use 5-fluorouracil /calcipotriene cream on infected or open wounds.   You will develop redness, irritation and some crusting at areas where you have pre-cancer damage/actinic keratoses. IF YOU DEVELOP PAIN, BLEEDING, OR  SIGNIFICANT CRUSTING, STOP THE TREATMENT EARLY - you have already gotten a good response and the actinic keratoses should clear up well.  Wash your hands after applying 5-fluorouracil  5% cream on your skin.   A moisturizer or sunscreen with a minimum SPF 30 should be applied each morning.   Once you have finished the treatment, you can apply a thin layer of Vaseline twice a day to irritated areas to soothe and calm the areas more quickly. If you experience significant discomfort, contact your physician.  For some patients it is necessary to repeat the treatment for best results.  SIDE EFFECTS: When using 5-fluorouracil /calcipotriene cream, you may have mild irritation, such as redness, dryness, swelling, or a mild burning sensation. This usually resolves within 2 weeks. The more actinic keratoses you have, the more redness and inflammation you can expect during treatment. Eye irritation has been reported rarely. If this occurs, please let us  know.  If you have any trouble using this cream, please call the office. If you have any other questions about this information, please do not hesitate to ask me before you leave the office.   Actinic keratoses are precancerous spots that appear secondary to cumulative UV radiation exposure/sun exposure over time. They are chronic with expected duration over 1 year. A portion of actinic keratoses will progress to squamous cell carcinoma of the skin. It is not possible to reliably predict which spots will progress to skin cancer and so treatment is recommended to prevent development of skin cancer.  Recommend daily broad spectrum sunscreen SPF 30+ to sun-exposed areas, reapply every 2 hours as needed.  Recommend staying in the shade or wearing long sleeves, sun glasses (UVA+UVB protection) and wide brim  hats (4-inch brim around the entire circumference of the hat). Call for new or changing lesions.   Cryotherapy Aftercare  Wash gently with soap and water  everyday.   Apply Vaseline and Band-Aid daily until healed.   Seborrheic Keratosis  What causes seborrheic keratoses? Seborrheic keratoses are harmless, common skin growths that first appear during adult life.  As time goes by, more growths appear.  Some people may develop a large number of them.  Seborrheic keratoses appear on both covered and uncovered body parts.  They are not caused by sunlight.  The tendency to develop seborrheic keratoses can be inherited.  They vary in color from skin-colored to gray, brown, or even black.  They can be either smooth or have a rough, warty surface.   Seborrheic keratoses are superficial and look as if they were stuck on the skin.  Under the microscope this type of keratosis looks like layers upon layers of skin.  That is why at times the top layer may seem to fall off, but the rest of the growth remains and re-grows.    Treatment Seborrheic keratoses do not need to be treated, but can easily be removed in the office.  Seborrheic keratoses often cause symptoms when they rub on clothing or jewelry.  Lesions can be in the way of shaving.  If they become inflamed, they can cause itching, soreness, or burning.  Removal of a seborrheic keratosis can be accomplished by freezing, burning, or surgery. If any spot bleeds, scabs, or grows rapidly, please return to have it checked, as these can be an indication of a skin cancer.   Melanoma ABCDEs  Melanoma is the most dangerous type of skin cancer, and is the leading cause of death from skin disease.  You are more likely to develop melanoma if you: Have light-colored skin, light-colored eyes, or red or blond hair Spend a lot of time in the sun Tan regularly, either outdoors or in a tanning bed Have had blistering sunburns, especially during childhood Have a close family member who has had a melanoma Have atypical moles or large birthmarks  Early detection of melanoma is key since treatment is typically  straightforward and cure rates are extremely high if we catch it early.   The first sign of melanoma is often a change in a mole or a new dark spot.  The ABCDE system is a way of remembering the signs of melanoma.  A for asymmetry:  The two halves do not match. B for border:  The edges of the growth are irregular. C for color:  A mixture of colors are present instead of an even brown color. D for diameter:  Melanomas are usually (but not always) greater than 6mm - the size of a pencil eraser. E for evolution:  The spot keeps changing in size, shape, and color.  Please check your skin once per month between visits. You can use a small mirror in front and a large mirror behind you to keep an eye on the back side or your body.   If you see any new or changing lesions before your next follow-up, please call to schedule a visit.  Please continue daily skin protection including broad spectrum sunscreen SPF 30+ to sun-exposed areas, reapplying every 2 hours as needed when you're outdoors.   Staying in the shade or wearing long sleeves, sun glasses (UVA+UVB protection) and wide brim hats (4-inch brim around the entire circumference of the hat) are also recommended for sun protection.  Due to recent changes in healthcare laws, you may see results of your pathology and/or laboratory studies on MyChart before the doctors have had a chance to review them. We understand that in some cases there may be results that are confusing or concerning to you. Please understand that not all results are received at the same time and often the doctors may need to interpret multiple results in order to provide you with the best plan of care or course of treatment. Therefore, we ask that you please give us  2 business days to thoroughly review all your results before contacting the office for clarification. Should we see a critical lab result, you will be contacted sooner.   If You Need Anything After Your Visit  If you  have any questions or concerns for your doctor, please call our main line at [phone] and press option 4 to reach your doctor's medical assistant. If no one answers, please leave a voicemail as directed and we will return your call as soon as possible. Messages left after 4 pm will be answered the following business day.   You may also send us  a message via MyChart. We typically respond to MyChart messages within 1-2 business days.  For prescription refills, please ask your pharmacy to contact our office. Our fax number is [phone].  If you have an urgent issue when the clinic is closed that cannot wait until the next business day, you can page your doctor at the number below.    Please note that while we do our best to be available for urgent issues outside of office hours, we are not available 24/7.   If you have an urgent issue and are unable to reach us , you may choose to seek medical care at your doctor's office, retail clinic, urgent care center, or emergency room.  If you have a medical emergency, please immediately call 911 or go to the emergency department.  Pager Numbers  - Dr. Bary Likes: [phone]  - Dr. Annette Barters: [phone]  - Dr. Felipe Horton: [phone]   In the event of inclement weather, please call our main line at [phone] for an update on the status of any delays or closures.  Dermatology Medication Tips: Please keep the boxes that topical medications come in in order to help keep track of the instructions about where and how to use these. Pharmacies typically print the medication instructions only on the boxes and not directly on the medication tubes.   If your medication is too expensive, please contact our office at [phone] option 4 or send us  a message through MyChart.   We are unable to tell what your co-pay for medications will be in advance as this is different depending on your insurance coverage. However, we may be able to find a substitute  medication at lower cost or fill out paperwork to get insurance to cover a needed medication.   If a prior authorization is required to get your medication covered by your insurance company, please allow us  1-2 business days to complete this process.  Drug prices often vary depending on where the prescription is filled and some pharmacies may offer cheaper prices.  The website www.goodrx.com contains coupons for medications through different pharmacies. The prices here do not account for what the cost may be with help from insurance (it may be cheaper with your insurance), but the website can give you the price if you did not use any insurance.  - You can print the associated coupon and take  it with your prescription to the pharmacy.  - You may also stop by our office during regular business hours and pick up a GoodRx coupon card.  - If you need your prescription sent electronically to a different pharmacy, notify our office through [facility] or by phone at [phone] option 4.     Si Usted Necesita Algo Despus de Su Visita  Tambin puede enviarnos un mensaje a travs de Clinical cytogeneticist. Por lo general respondemos a los mensajes de MyChart en el transcurso de 1 a 2 das hbiles.  Para renovar recetas, por favor pida a su farmacia que se ponga en contacto con nuestra oficina. Franz Jacks de fax es [place] [phone].  Si tiene un asunto urgente cuando la clnica est cerrada y que no puede esperar hasta el siguiente da hbil, puede llamar/localizar a su doctor(a) al nmero que aparece a continuacin.   Por favor, tenga en cuenta que aunque hacemos todo lo posible para estar disponibles para asuntos urgentes fuera del horario de [place], no estamos disponibles las 24 horas del da, los 7 [address]  [address] de la [place].   Si tiene un problema urgente y no puede comunicarse con nosotros, puede optar por buscar atencin mdica  en el consultorio de su doctor(a), en una clnica privada, en un centro de  atencin urgente o en una sala de emergencias.  Si tiene Engineer, drilling, por favor llame inmediatamente al 911 o vaya a la sala de emergencias.  Nmeros de bper  - Dr. Bary Likes: [phone]  - Dra. Annette Barters: [phone]  - Dr. Felipe Horton: [phone]   En caso de inclemencias del tiempo, por favor llame a Lajuan Pila principal al [phone] para una actualizacin sobre el [place] de cualquier retraso o cierre.  Consejos para la medicacin en dermatologa: Por favor, guarde las cajas en las que vienen los medicamentos de uso tpico para ayudarle a seguir las instrucciones sobre dnde y cmo usarlos. Las farmacias generalmente imprimen las instrucciones del medicamento slo en las cajas y no directamente en los tubos del [place].   Si su medicamento es muy caro, por favor, pngase en contacto con Bettyjane Brunet llamando al [phone] y presione la opcin 4 o envenos un mensaje a travs de Clinical cytogeneticist.   No podemos decirle cul ser su copago por los medicamentos por adelantado ya que esto es diferente dependiendo de la cobertura de su seguro. Sin embargo, es posible que podamos encontrar un medicamento sustituto a Audiological scientist un formulario para que el seguro cubra el medicamento que se considera necesario.   Si se requiere una autorizacin previa para que su compaa de seguros [place] su medicamento, por favor permtanos de 1 a 2 das hbiles para completar este proceso.  Los precios de los medicamentos varan con frecuencia dependiendo del Environmental consultant de dnde se surte la receta y alguna farmacias pueden ofrecer precios ms baratos.  El sitio web www.goodrx.com tiene cupones para medicamentos de Health and safety inspector. Los precios aqu no tienen en cuenta lo que podra costar con la ayuda del seguro (puede ser ms barato con su seguro), pero el sitio web puede darle el precio si no utiliz Tourist information centre manager.  - Puede imprimir el cupn correspondiente y llevarlo con su receta a la  farmacia.  - Tambin puede pasar por nuestra oficina durante el horario de atencin regular y Education officer, museum una tarjeta de cupones de GoodRx.  - Si necesita que su receta se enve electrnicamente a Psychiatrist, informe a nuestra oficina a travs de MyChart  de [facility] o por telfono llamando al [phone] y presione la opcin 4.

## 2023-11-28 ENCOUNTER — Encounter: Payer: Self-pay | Admitting: Dermatology

## 2023-12-03 DIAGNOSIS — Z7722 Contact with and (suspected) exposure to environmental tobacco smoke (acute) (chronic): Secondary | ICD-10-CM | POA: Diagnosis not present

## 2023-12-03 DIAGNOSIS — Z86718 Personal history of other venous thrombosis and embolism: Secondary | ICD-10-CM | POA: Diagnosis not present

## 2023-12-03 DIAGNOSIS — Z7901 Long term (current) use of anticoagulants: Secondary | ICD-10-CM | POA: Diagnosis not present

## 2023-12-03 DIAGNOSIS — J45909 Unspecified asthma, uncomplicated: Secondary | ICD-10-CM | POA: Diagnosis not present

## 2023-12-03 DIAGNOSIS — I252 Old myocardial infarction: Secondary | ICD-10-CM | POA: Diagnosis not present

## 2023-12-03 DIAGNOSIS — Z8619 Personal history of other infectious and parasitic diseases: Secondary | ICD-10-CM | POA: Diagnosis not present

## 2023-12-03 DIAGNOSIS — J4489 Other specified chronic obstructive pulmonary disease: Secondary | ICD-10-CM | POA: Diagnosis not present

## 2023-12-03 DIAGNOSIS — Z792 Long term (current) use of antibiotics: Secondary | ICD-10-CM | POA: Diagnosis not present

## 2023-12-03 DIAGNOSIS — J90 Pleural effusion, not elsewhere classified: Secondary | ICD-10-CM | POA: Diagnosis not present

## 2023-12-03 DIAGNOSIS — Z7951 Long term (current) use of inhaled steroids: Secondary | ICD-10-CM | POA: Diagnosis not present

## 2023-12-03 DIAGNOSIS — J328 Other chronic sinusitis: Secondary | ICD-10-CM | POA: Diagnosis not present

## 2023-12-03 DIAGNOSIS — Z87891 Personal history of nicotine dependence: Secondary | ICD-10-CM | POA: Diagnosis not present

## 2023-12-03 DIAGNOSIS — Z79899 Other long term (current) drug therapy: Secondary | ICD-10-CM | POA: Diagnosis not present

## 2023-12-03 DIAGNOSIS — J479 Bronchiectasis, uncomplicated: Secondary | ICD-10-CM | POA: Diagnosis not present

## 2023-12-03 DIAGNOSIS — K219 Gastro-esophageal reflux disease without esophagitis: Secondary | ICD-10-CM | POA: Diagnosis not present

## 2023-12-03 DIAGNOSIS — M25552 Pain in left hip: Secondary | ICD-10-CM | POA: Diagnosis not present

## 2023-12-04 ENCOUNTER — Ambulatory Visit: Admitting: Family Medicine

## 2023-12-04 ENCOUNTER — Encounter: Payer: Self-pay | Admitting: Family Medicine

## 2023-12-04 VITALS — BP 130/65 | HR 72 | Ht 72.0 in | Wt 164.0 lb

## 2023-12-04 DIAGNOSIS — S41111D Laceration without foreign body of right upper arm, subsequent encounter: Secondary | ICD-10-CM

## 2023-12-04 DIAGNOSIS — Z4802 Encounter for removal of sutures: Secondary | ICD-10-CM | POA: Diagnosis not present

## 2023-12-04 NOTE — Progress Notes (Signed)
 Established patient visit   Patient: [name]   DOB: [birth year]   83 y.o. Male  MRN: [id number] Visit Date: 12/04/2023  Today's healthcare provider: Aden Agreste, MD   Chief Complaint  Patient presents with   Follow-up    Patient was seen at [facility] on 11/26/23 due to  forearm laceration on right arm where he had stitches placed. He was advised to plan for suture removal in 7 days.    Subjective    HPI HPI     Follow-up    Additional comments: Patient was seen at [facility] on 11/26/23 due to  forearm laceration on right arm where he had stitches placed. He was advised to plan for suture removal in 7 days.       Last edited by Pasty Bongo, CMA on 12/04/2023  2:00 PM.       Discussed the use of AI scribe software for clinical note transcription with the patient, who gave verbal consent to proceed.  History of Present Illness   [name] is an 83 year old male who presents for suture removal following a laceration.  Eight days ago, he sustained a laceration requiring two internal and external sutures. The internal sutures are absorbable. Initial bleeding was significant, necessitating an emergency room visit. Bleeding continued to ooze the following day.  He is on Xarelto , which increases bleeding risk and complicates hemostasis. He describes his skin as fragile, similar to 'plastic wrap', and notes irritation from certain tapes and Band-Aids.         Medications: Outpatient Medications Prior to Visit  Medication Sig   albuterol  (PROVENTIL  HFA;VENTOLIN  HFA) 108 (90 Base) MCG/ACT inhaler Inhale 2 puffs into the lungs every 6 (six) hours as needed for wheezing or shortness of breath.   albuterol  (PROVENTIL ) (2.5 MG/3ML) 0.083% nebulizer solution Take 2.5 mg by nebulization in the morning and at bedtime.   brimonidine  (ALPHAGAN ) 0.15 % ophthalmic solution Place 1 drop into both eyes 2 (two) times daily.   clobetasol  ointment (TEMOVATE ) 0.05 % Apply to  affected arm twice a day x 7 days, Avoid applying to face, groin, and axilla. Use as directed. Long-term use can cause thinning of the skin.   Desoximetasone  (TOPICORT ) 0.25 % ointment APPLY TO ECZEMA OF UPPER ARM TWICE DAILYAS DIRECTED.   diltiazem  (CARTIA  XT) 180 MG 24 hr capsule TAKE 1 CAPSULE BY MOUTH ONCE DAILY   dorzolamide  (TRUSOPT ) 2 % ophthalmic solution Place 1 drop into both eyes 2 (two) times daily.    finasteride  (PROSCAR ) 5 MG tablet Take 1 tablet (5 mg total) by mouth daily.   fluorouracil  (EFUDEX ) 5 % cream Apply topically 2 (two) times daily. Bid to aa left dorsum hand until area gets red and irritated   ketoconazole  (NIZORAL ) 2 % shampoo    latanoprost  (XALATAN ) 0.005 % ophthalmic solution Place 1 drop into both eyes at bedtime.    lidocaine  (LIDODERM ) 5 % Place 1 patch onto the skin every 12 (twelve) hours. Remove & Discard patch within 12 hours or as directed by MD   montelukast  (SINGULAIR ) 10 MG tablet Take 10 mg by mouth at bedtime.   omeprazole  (PRILOSEC) 20 MG capsule Take 1 capsule (20 mg total) by mouth daily.   rivaroxaban  (XARELTO ) 10 MG TABS tablet Take 1 tablet (10 mg total) by mouth daily.   Sodium Chloride , Inhalant, 7 % NEBU Take 1 vial by nebulization 2 (two) times daily.   tiotropium (SPIRIVA ) 18 MCG inhalation  capsule Place 1 capsule (18 mcg total) into inhaler and inhale every evening.   traZODone  (DESYREL ) 50 MG tablet Take 0.5 tablets (25 mg total) by mouth at bedtime as needed for sleep.   Vibegron  (GEMTESA ) 75 MG TABS Take 1 tablet (75 mg total) by mouth daily.   No facility-administered medications prior to visit.    Review of Systems     Objective    BP 130/65 (BP Location: Left Arm, Patient Position: Sitting, Cuff Size: Normal)   Pulse 72   Ht 6' (1.829 m)   Wt 164 lb (74.4 kg)   SpO2 96%   BMI 22.24 kg/m    Physical Exam   Physical Exam   SKIN: Wound healed well with minor oozing at the site.       No results found for any visits on  12/04/23.  Assessment & Plan     Problem List Items Addressed This Visit   None Visit Diagnoses       Laceration of right upper extremity, subsequent encounter    -  Primary     Visit for suture removal              Laceration with sutures Laceration on the arm, sutured 8 days ago, is healing well with no signs of infection. Two internal sutures are present and will dissolve on his own. External sutures were removed during the visit. Minor oozing is present, likely due to anticoagulant therapy, but it is not concerning. The wound was deeper than previous skin tears, necessitating sutures to control bleeding. - Remove external sutures. - Apply a Band-Aid with gauze to the wound. - Advise to keep the wound clean. - Recommend using petroleum jelly if the wound appears to crust or dry to minimize scarring.  Anticoagulant therapy He is on Xarelto , which contributes to prolonged bleeding. This is a known side effect and was a factor in the initial bleeding severity and duration. The anticoagulant effect of Xarelto  is significant, as evidenced by the prolonged oozing post-injury.        No follow-ups on file.       Aden Agreste, MD  [facility] [phone] (phone) [phone] (fax)  [facility]

## 2023-12-08 ENCOUNTER — Telehealth: Payer: Self-pay | Admitting: Family Medicine

## 2023-12-08 ENCOUNTER — Encounter: Payer: Self-pay | Admitting: Family Medicine

## 2023-12-08 NOTE — Telephone Encounter (Signed)
 Copied from CRM [id number]. Topic: General - Other >> Dec 08, 2023  2:16 PM Dawna HERO wrote: Reason for CRM: patients daughter is calling in to report a fall the patient had on Saturday , says he is okay and she just wanted the doctor to be aware

## 2023-12-09 NOTE — Telephone Encounter (Signed)
 Noted

## 2023-12-10 NOTE — Progress Notes (Signed)
 Error

## 2023-12-11 ENCOUNTER — Ambulatory Visit: Admitting: Urology

## 2023-12-11 VITALS — BP 131/70 | HR 86 | Wt 164.0 lb

## 2023-12-11 DIAGNOSIS — R350 Frequency of micturition: Secondary | ICD-10-CM

## 2023-12-11 NOTE — Progress Notes (Signed)
 PTNS  Session # monthly maintenance  Health & Social Factors: No change  Caffeine: 2 Alcohol: 0 Daytime voids #per day: 5 Night-time voids #per night: 3 Urgency: Mild Incontinence Episodes #per day: 0 Ankle used: Left  Treatment Setting: 8 Feeling/ Response: Sensory  Comments: Patient tolerated procedure  Performed By: Powell Sink, CMA   Follow Up: One month for PTNS maintenance

## 2023-12-22 ENCOUNTER — Ambulatory Visit: Admitting: Dermatology

## 2024-01-07 ENCOUNTER — Ambulatory Visit: Admitting: Urology

## 2024-01-07 DIAGNOSIS — R350 Frequency of micturition: Secondary | ICD-10-CM

## 2024-01-07 NOTE — Progress Notes (Signed)
 PTNS  Session # Monthly  Health & Social Factors: no change Caffeine: 2 Alcohol: 0 Daytime voids #per day: 5 Night-time voids #per night: 3 Urgency: none Incontinence Episodes #per day: 0 Ankle used: left Treatment Setting: 6 Feeling/ Response: sensory Comments: Patient tolerated procedure   Performed By: Laymon Ned, CMA  Follow Up: One month for PTNS maintenance

## 2024-01-19 ENCOUNTER — Other Ambulatory Visit: Payer: Self-pay | Admitting: Family Medicine

## 2024-02-11 ENCOUNTER — Ambulatory Visit: Admitting: Urology

## 2024-02-11 VITALS — BP 139/80 | HR 71

## 2024-02-11 DIAGNOSIS — R350 Frequency of micturition: Secondary | ICD-10-CM | POA: Diagnosis not present

## 2024-02-11 NOTE — Progress Notes (Signed)
 PTNS  Session # Monthly  Health & Social Factors: No Change Caffeine: 2 Alcohol: 0 Daytime voids #per day: 5 Night-time voids #per night: 3 Urgency: Mild Incontinence Episodes #per day: 0 Ankle used: Left Treatment Setting: 5 Feeling/ Response: Sensory Comments: Patient Tolerated Well  Performed By: Beauford Browner, CCMA  Follow Up: Monthly Maintenance

## 2024-02-12 DIAGNOSIS — I440 Atrioventricular block, first degree: Secondary | ICD-10-CM | POA: Diagnosis not present

## 2024-02-12 DIAGNOSIS — Z86718 Personal history of other venous thrombosis and embolism: Secondary | ICD-10-CM | POA: Diagnosis not present

## 2024-02-12 DIAGNOSIS — I25119 Atherosclerotic heart disease of native coronary artery with unspecified angina pectoris: Secondary | ICD-10-CM | POA: Diagnosis not present

## 2024-02-12 DIAGNOSIS — I479 Paroxysmal tachycardia, unspecified: Secondary | ICD-10-CM | POA: Diagnosis not present

## 2024-03-02 ENCOUNTER — Ambulatory Visit (INDEPENDENT_AMBULATORY_CARE_PROVIDER_SITE_OTHER): Payer: PPO

## 2024-03-02 VITALS — Ht 72.0 in | Wt 164.0 lb

## 2024-03-02 DIAGNOSIS — Z0000 Encounter for general adult medical examination without abnormal findings: Secondary | ICD-10-CM | POA: Diagnosis not present

## 2024-03-02 NOTE — Progress Notes (Signed)
 Subjective:   [name] is a 83 y.o. who presents for a Medicare Wellness preventive visit.  As a reminder, Annual Wellness Visits don't include a physical exam, and some assessments may be limited, especially if this visit is performed virtually. We may recommend an in-person follow-up visit with your provider if needed.  Visit Complete: Virtual I connected with  [name] on 03/02/24 by a audio enabled telemedicine application and verified that I am speaking with the correct person using two identifiers.  Patient Location: Home  Provider Location: Office/Clinic  I discussed the limitations of evaluation and management by telemedicine. The patient expressed understanding and agreed to proceed.  Vital Signs: Because this visit was a virtual/telehealth visit, some criteria may be missing or patient reported. Any vitals not documented were not able to be obtained and vitals that have been documented are patient reported.  VideoDeclined- This patient declined Librarian, academic. Therefore the visit was completed with audio only.  [name] Participating in Visit: Patient.   AWV Questionnaire: Yes: Patient Medicare AWV questionnaire was completed by the patient on 02/29/24; I have confirmed that all information answered by patient is correct and no changes since this date.  Cardiac Risk Factors include: advanced age (>86men, >60 women);hypertension;male gender     Objective:    Today's Vitals   03/02/24 0923  Weight: 164 lb (74.4 kg)  Height: 6' (1.829 m)   Body mass index is 22.24 kg/m.     03/02/2024    9:28 AM 11/26/2023    4:50 PM 10/30/2023    7:04 AM 10/22/2023    6:03 AM 02/25/2023   11:32 AM 03/06/2022    2:04 PM 10/19/2021    6:26 AM  Advanced Directives  Does Patient Have a Medical Advance Directive? Yes Yes Yes No Yes No Yes  Type of Estate agent of [employer];Living will Healthcare Power of [place];Living will  Healthcare Power of [place];Living will  Healthcare Power of [place];Living will  Healthcare Power of [place];Living will  Does patient want to make changes to medical advance directive?       No - Patient declined  Copy of Healthcare Power of Attorney in Chart? No - copy requested      Yes - validated most recent copy scanned in chart (See row information)  Would patient like information on creating a medical advance directive?    No - Patient declined  No - Patient declined     Current Medications (verified) Outpatient Encounter Medications as of 03/02/2024  Medication Sig   albuterol  (PROVENTIL  HFA;VENTOLIN  HFA) 108 (90 Base) MCG/ACT inhaler Inhale 2 puffs into the lungs every 6 (six) hours as needed for wheezing or shortness of breath.   albuterol  (PROVENTIL ) (2.5 MG/3ML) 0.083% nebulizer solution Take 2.5 mg by nebulization in the morning and at bedtime.   brimonidine  (ALPHAGAN ) 0.15 % ophthalmic solution Place 1 drop into both eyes 2 (two) times daily.   CARTIA  XT 180 MG 24 hr capsule TAKE 1 CAPSULE BY MOUTH ONCE DAILY   clobetasol  ointment (TEMOVATE ) 0.05 % Apply to affected arm twice a day x 7 days, Avoid applying to face, groin, and axilla. Use as directed. Long-term use can cause thinning of the skin.   Desoximetasone  (TOPICORT ) 0.25 % ointment APPLY TO ECZEMA OF UPPER ARM TWICE DAILYAS DIRECTED.   dorzolamide  (TRUSOPT ) 2 % ophthalmic solution Place 1 drop into both eyes 2 (two) times daily.    finasteride  (PROSCAR ) 5 MG tablet Take  1 tablet (5 mg total) by mouth daily.   fluorouracil  (EFUDEX ) 5 % cream Apply topically 2 (two) times daily. Bid to aa left dorsum hand until area gets red and irritated   ketoconazole  (NIZORAL ) 2 % shampoo    latanoprost  (XALATAN ) 0.005 % ophthalmic solution Place 1 drop into both eyes at bedtime.    lidocaine  (LIDODERM ) 5 % Place 1 patch onto the skin every 12 (twelve) hours. Remove & Discard patch within 12 hours or as directed by MD   montelukast   (SINGULAIR ) 10 MG tablet Take 10 mg by mouth at bedtime.   omeprazole  (PRILOSEC) 20 MG capsule Take 1 capsule (20 mg total) by mouth daily.   rivaroxaban  (XARELTO ) 10 MG TABS tablet Take 1 tablet (10 mg total) by mouth daily.   Sodium Chloride , Inhalant, 7 % NEBU Take 1 vial by nebulization 2 (two) times daily.   tiotropium (SPIRIVA ) 18 MCG inhalation capsule Place 1 capsule (18 mcg total) into inhaler and inhale every evening.   traZODone  (DESYREL ) 50 MG tablet Take 0.5 tablets (25 mg total) by mouth at bedtime as needed for sleep.   [DISCONTINUED] Vibegron  (GEMTESA ) 75 MG TABS Take 1 tablet (75 mg total) by mouth daily.   No facility-administered encounter medications on file as of 03/02/2024.    Allergies (verified) Timolol  maleate [timolol  maleate], Atorvastatin , Bactrim  [sulfamethoxazole -trimethoprim ], Lisinopril, and Symbicort  [budesonide -formoterol  fumarate]   History: Past Medical History:  Diagnosis Date   AB (asthmatic bronchitis)    Abnormal liver enzymes    ABPA (allergic bronchopulmonary aspergillosis) (HCC)    Allergic rhinitis    Aortic atherosclerosis (HCC)    Asthma 05/18   CAP (community acquired pneumonia)    Cellulitis    COPD (chronic obstructive pulmonary disease) (HCC) 05/15   Coronary artery disease    a.) LHC 08/19/2016: 50% pRCA; intervention deferred opting for med. mgmt.   Diastolic dysfunction    a.) TTE 08/18/2016: EF 50%; inf HK; mild BAE, mild RV enlargement; G1DD   Diverticulitis    DVT (deep venous thrombosis) (HCC) 08/20/2016   a.) Doppler 08/20/2016: chronic appearing; occlusive DVT LEFT popliteal vein, with additional nearly occlusive DVT in the overlying distal SFV.   Dyspnea    GERD (gastroesophageal reflux disease) 08/20   Glaucoma 10/13   Headache    History of sepsis    HLD (hyperlipidemia)    Hx MRSA infection    Hx of basal cell carcinoma 02/26/2018   L lat mid back   Hypertension    Resolved - no meds   Insomnia    a.) takes  malatonin   NSTEMI (non-ST elevated myocardial infarction) (HCC) 08/18/2016   a.) LHC 08/19/2016: 50% pRCA; intervention deferred opting for med. mgmt.   Osteoarthritis    Paroxysmal tachycardia (HCC)    PVC (premature ventricular contraction)    Squamous cell carcinoma of skin 02/19/2023   Right dorsal hand. WD SCC. Mohs 03/13/23   Squamous cell carcinoma of skin 08/12/2023   L hand dorsum, pt will treat with 5FU/Calcipotriene   Past Surgical History:  Procedure Laterality Date   APPENDECTOMY  06/18/41   BRONCHOSCOPY     CATARACT EXTRACTION W/PHACO Left 09/01/2019   Procedure: CATARACT EXTRACTION PHACO AND INTRAOCULAR LENS PLACEMENT (IOC) LEFT 11.72 01:12.2 16.2%;  Surgeon: Mittie Gaskin, MD;  Location: [facility];  Service: Ophthalmology;  Laterality: Left;   CATARACT EXTRACTION W/PHACO Right 09/22/2019   Procedure: CATARACT EXTRACTION PHACO AND INTRAOCULAR LENS PLACEMENT (IOC) RIGHT;  Surgeon: Mittie Gaskin, MD;  Location: [facility];  Service: Ophthalmology;  Laterality: Right;  6.88 103.4 10.9%   EYE SURGERY  06/20   GANGLION CYST EXCISION Left 10/19/2021   Procedure: REMOVAL GANGLION OF WRIST;  Surgeon: Cleotilde Barrio, MD;  Location: [facility];  Service: Orthopedics;  Laterality: Left;   LEFT HEART CATH AND CORONARY ANGIOGRAPHY N/A 08/19/2016   Procedure: Left Heart Cath and Coronary Angiography;  Surgeon: Denyse DELENA Bathe, MD;  Location: [facility];  Service: Cardiovascular;  Laterality: N/A;   NASAL SINUS SURGERY  09/27/2014   Family History  Problem Relation Age of Onset   Breast cancer Sister    Heart failure Mother    Pancreatic cancer Sister    Alzheimer's disease Father    Brain cancer Daughter    Social History   Socioeconomic History   Marital status: Married    Spouse name: Blima   Number of children: 2   Years of education: College   Highest education level: Bachelor's degree (e.g., BA, AB, BS)  Occupational History    Occupation: Retired    Associate Professor: RETIRED    Comment: Engineer  Tobacco Use   Smoking status: Former    Current packs/day: 0.00    Average packs/day: 0.5 packs/day for 3.0 years (1.5 ttl pk-yrs)    Types: Cigarettes    Start date: 06/18/1955    Quit date: 06/17/1958    Years since quitting: 65.7    Passive exposure: Past   Smokeless tobacco: Never   Tobacco comments:    smoking while in college  Vaping Use   Vaping status: Never Used  Substance and Sexual Activity   Alcohol use: Yes    Alcohol/week: 3.0 - 4.0 standard drinks of alcohol    Types: 3 - 4 Glasses of wine per week    Comment: occ wine   Drug use: No   Sexual activity: Yes  Other Topics Concern   Not on file  Social History Narrative   He is a married father of 2 daughters.   He smoked casually while in college for about 3 years less than a pack a day.   He drinks 3-4 glass of wine at night. He also has about 2 servings of coffee a day   He is a very active gentleman with Silver Sneakers exercise program at least 3-4 days a week.   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   In addition to this standing his exercise, he does lots of gardening and uses a chain saw to cut wood.            Social Drivers of Corporate investment banker Strain: Low Risk  (03/02/2024)   Overall Financial Resource Strain (CARDIA)    Difficulty of Paying Living Expenses: Not hard at all  Food Insecurity: No Food Insecurity (03/02/2024)   Hunger Vital Sign    Worried About Running Out of Food in the Last Year: Never true    Ran Out of Food in the Last Year: Never true  Transportation Needs: No Transportation Needs (03/02/2024)   PRAPARE - Administrator, Civil Service (Medical): No    Lack of Transportation (Non-Medical): No  Physical Activity: Sufficiently Active (03/02/2024)   Exercise Vital Sign    Days of Exercise per Week: 3 days    Minutes of Exercise per Session: 50 min  Stress: No Stress  Concern Present (03/02/2024)   [employer] of Occupational Health - Occupational Stress  Questionnaire    Feeling of Stress: Not at all  Social Connections: Moderately Integrated (03/02/2024)   Social Connection and Isolation Panel    Frequency of Communication with Friends and Family: Twice a week    Frequency of Social Gatherings with Friends and Family: Once a week    Attends Religious Services: More than 4 times per year    Active Member of [employer] or Organizations: No    Attends Engineer, structural: Not on file    Marital Status: Married    Tobacco Counseling Counseling given: Not Answered Tobacco comments: smoking while in college    Clinical Intake:  Pre-visit preparation completed: Yes  Pain : No/denies pain     BMI - recorded: 22.24 Nutritional Status: BMI of 19-24  Normal Nutritional Risks: None Diabetes: No  Lab Results  Component Value Date   HGBA1C 5.4 03/18/2023   HGBA1C 5.4 08/18/2016     How often do you need to have someone help you when you read instructions, pamphlets, or other written materials from your doctor or pharmacy?: 1 - Never  Interpreter Needed?: No  Information entered by :: Ellouise Haws, LPN   Activities of Daily Living     02/29/2024    7:26 PM  In your present state of health, do you have any difficulty performing the following activities:  Hearing? 0  Vision? 0  Difficulty concentrating or making decisions? 0  Walking or climbing stairs? 0  Dressing or bathing? 0  Doing errands, shopping? 0  Preparing Food and eating ? N  Using the Toilet? N  In the past six months, have you accidently leaked urine? N  Do you have problems with loss of bowel control? N  Managing your Medications? N  Managing your Finances? N  Housekeeping or managing your Housekeeping? N    Patient Care Team: Myrla [name], MD as PCP - General (Family Medicine) Mittie Gaskin, MD as Referring Physician  (Ophthalmology) Arlana Juliene PARAS, MD as Referring Physician (Internal Medicine) Ronal Carlin RAMAN, MD as Referring Physician (Otolaryngology) Bosie Vinie LABOR, MD as Consulting Physician (Cardiology) Verta Royden DASEN, DPM as Consulting Physician (Podiatry) Gaston Hamilton, MD as Consulting Physician (Urology)  I have updated your Care Teams any recent Medical Services you may have received from other providers in the past year.     Assessment:   This is a routine wellness examination for [name].  Hearing/Vision screen Hearing Screening - Comments:: Pt denies any hearing issues  Vision Screening - Comments:: Wears rx glasses - up to date with routine eye exams with Dr [facility]    Goals Addressed             This Visit's Progress    Patient Stated       Maintain health and activity        Depression Screen     03/02/2024    9:28 AM 02/25/2023   11:30 AM 03/06/2022    2:03 PM 11/07/2021    3:40 PM 07/18/2021    9:10 AM 03/04/2021    8:10 AM 01/02/2021   10:29 AM  PHQ 2/9 Scores  PHQ - 2 Score 0 0 0 0 0 0 0  PHQ- 9 Score   0  0    Exception Documentation      Patient refusal     Fall Risk     02/29/2024    7:26 PM 02/23/2023    3:57 PM 03/06/2022    2:05 PM  11/07/2021    3:40 PM 07/18/2021    9:09 AM  Fall Risk   Falls in the past year? 0 0 0 0 0  Number falls in past yr: 0 0 0 0 0  Injury with Fall? 0  0 0 0  Risk for fall due to : No Fall Risks No Fall Risks No Fall Risks  No Fall Risks  Follow up Falls prevention discussed Falls prevention discussed;Education provided Falls prevention discussed;Falls evaluation completed        Data saved with a previous flowsheet row definition    MEDICARE RISK AT HOME:  Medicare Risk at Home Any stairs in or around the home?: (Patient-Rptd) Yes If so, are there any without handrails?: (Patient-Rptd) No Home free of loose throw rugs in walkways, pet beds, electrical cords, etc?: (Patient-Rptd) Yes Adequate lighting  in your home to reduce risk of falls?: (Patient-Rptd) Yes Life alert?: (Patient-Rptd) No Use of a cane, walker or w/c?: (Patient-Rptd) No Grab bars in the bathroom?: (Patient-Rptd) Yes Shower chair or bench in shower?: (Patient-Rptd) Yes Elevated toilet seat or a handicapped toilet?: (Patient-Rptd) No  TIMED UP AND GO:  Was the test performed?  No  Cognitive Function: 6CIT completed        03/02/2024    9:30 AM 02/25/2023   11:33 AM 03/06/2022    2:06 PM 03/04/2021    8:16 AM 01/02/2021   10:30 AM  6CIT Screen  What Year? 0 points 0 points 0 points 0 points 0 points  What month? 0 points 0 points 0 points 0 points 0 points  What time? 0 points 0 points 0 points 0 points 0 points  Count back from 20 0 points 0 points 0 points 0 points 0 points  Months in reverse 0 points 0 points 0 points 0 points 0 points  Repeat phrase 4 points 0 points 0 points 0 points 0 points  Total Score 4 points 0 points 0 points 0 points 0 points    Immunizations Immunization History  Administered Date(s) Administered   Fluad Quad(high Dose 65+) 02/08/2019, 03/02/2020, 03/12/2022   INFLUENZA, HIGH DOSE SEASONAL PF 05/04/2015, 05/04/2015, 03/21/2016, 03/21/2016, 03/24/2017, 03/24/2017   Influenza Split 03/02/2012, 03/17/2013, 05/07/2014, 02/23/2018   Influenza Whole 03/24/2007, 03/15/2009, 02/15/2010   Influenza,inj,Quad PF,6+ Mos 03/14/2021   Influenza-Unspecified 02/23/2018, 03/01/2023   PFIZER(Purple Top)SARS-COV-2 Vaccination 06/28/2019, 07/19/2019, 03/20/2020, 03/01/2023   Pfizer Covid-19 Vaccine Bivalent Booster 89yrs & up 03/16/2021   Pneumococcal Conjugate-13 11/17/2013   Pneumococcal Polysaccharide-23 11/19/2004, 03/19/2007, 03/17/2009, 02/09/2020   Td 05/16/1997, 05/14/2007, 09/11/2010   Td (Adult), 2 Lf Tetanus Toxid, Preservative Free 05/16/1997, 05/14/2007, 09/11/2010   Tdap 03/14/2021   Zoster Recombinant(Shingrix) 02/23/2018, 04/28/2018   Zoster, Live 01/02/2011    Screening  Tests Health Maintenance  Topic Date Due   Influenza Vaccine  01/16/2024   COVID-19 Vaccine (6 - 2025-26 season) 02/16/2024   Medicare Annual Wellness (AWV)  03/02/2025   DTaP/Tdap/Td (7 - Td or Tdap) 03/15/2031   Pneumococcal Vaccine: 50+ Years  Completed   Zoster Vaccines- Shingrix  Completed   HPV VACCINES  Aged Out   Meningococcal B Vaccine  Aged Out    Health Maintenance Items Addressed: See Nurse Notes at the end of this note  Additional Screening:  Vision Screening: Recommended annual ophthalmology exams for early detection of glaucoma and other disorders of the eye. Is the patient up to date with their annual eye exam?  Yes  Who is the provider or what is the name  of the office in which the patient attends annual eye exams? [facility]   Dental Screening: Recommended annual dental exams for proper oral hygiene  Community Resource Referral / Chronic Care Management: CRR required this visit?  No   CCM required this visit?  No   Plan:    I have personally reviewed and noted the following in the patient's chart:   Medical and social history Use of alcohol, tobacco or illicit drugs  Current medications and supplements including opioid prescriptions. Patient is not currently taking opioid prescriptions. Functional ability and status Nutritional status Physical activity Advanced directives List of other physicians Hospitalizations, surgeries, and ER visits in previous 12 months Vitals Screenings to include cognitive, depression, and falls Referrals and appointments  In addition, I have reviewed and discussed with patient certain preventive protocols, quality metrics, and best practice recommendations. A written personalized care plan for preventive services as well as general preventive health recommendations were provided to patient.   Ellouise VEAR Haws, LPN   [id number]   After Visit Summary: (MyChart) Due to this being a telephonic visit, the after  visit summary with patients personalized plan was offered to patient via MyChart   Notes: Nothing significant to report at this time.

## 2024-03-03 ENCOUNTER — Ambulatory Visit: Admitting: Podiatry

## 2024-03-03 DIAGNOSIS — B351 Tinea unguium: Secondary | ICD-10-CM | POA: Diagnosis not present

## 2024-03-03 DIAGNOSIS — M19071 Primary osteoarthritis, right ankle and foot: Secondary | ICD-10-CM

## 2024-03-03 DIAGNOSIS — M79676 Pain in unspecified toe(s): Secondary | ICD-10-CM

## 2024-03-03 MED ORDER — TRIAMCINOLONE ACETONIDE 40 MG/ML IJ SUSP
20.0000 mg | Freq: Once | INTRAMUSCULAR | Status: AC
  Administered 2024-03-03: 20 mg

## 2024-03-03 NOTE — Progress Notes (Signed)
 [name] presents today chief complaint of arthritic pain right here as he points to the fourth fifth tarsometatarsal joint of the right foot.  He actually has it marked with an X mark.  He is also concerned about the discoloration and loosening of the hallux nail on the same right foot.  This is a new problem.  No changes in his past medical history he states.  Objective: Vital signs are stable alert oriented x 3 pulses are palpable.  He has pain on palpation fourth fifth tarsometatarsal joint at the area marked with an X.  And the hallux nail on the right foot does demonstrate distal onycholysis with most likely onychomycosis.  There is no surrounding mycotic cutaneous infection.  Assessment: Pain limb secondary to osteoarthritis right foot.  Onychomycosis hallux right.  Plan: Debrided onychomycotic toenail.  I also provided him with injection 10 mg Kenalog  5 mg Marcaine  to the point of maximal tenderness fourth fifth tarsometatarsal joint.  Tolerated procedure well follow-up with him in 5 months.

## 2024-03-17 ENCOUNTER — Encounter: Payer: Self-pay | Admitting: Urology

## 2024-03-17 ENCOUNTER — Ambulatory Visit (INDEPENDENT_AMBULATORY_CARE_PROVIDER_SITE_OTHER): Admitting: Urology

## 2024-03-17 VITALS — BP 116/75 | HR 70 | Ht 72.0 in | Wt 157.6 lb

## 2024-03-17 DIAGNOSIS — R350 Frequency of micturition: Secondary | ICD-10-CM

## 2024-03-17 NOTE — Progress Notes (Signed)
 PTNS  Session # 21 month maintenance  Health & Social Factors: No change  Caffeine: 2 Alcohol: 0 Daytime voids #per day: 5 Night-time voids #per night: 3 Urgency: mild Incontinence Episodes #per day: None Ankle used: Left Treatment Setting: 6 Feeling/ Response: Sensory  Comments: Patient tolerated  Performed By: CLOTILDA CORNWALL, PA-C   Follow Up: 1 month for monthly maintenance

## 2024-03-18 ENCOUNTER — Ambulatory Visit: Payer: Self-pay | Admitting: Family Medicine

## 2024-03-18 ENCOUNTER — Encounter: Payer: Self-pay | Admitting: Family Medicine

## 2024-03-18 VITALS — BP 127/75 | HR 75 | Ht 72.0 in | Wt 157.2 lb

## 2024-03-18 DIAGNOSIS — F5101 Primary insomnia: Secondary | ICD-10-CM

## 2024-03-18 DIAGNOSIS — Z0000 Encounter for general adult medical examination without abnormal findings: Secondary | ICD-10-CM | POA: Diagnosis not present

## 2024-03-18 DIAGNOSIS — Z23 Encounter for immunization: Secondary | ICD-10-CM | POA: Diagnosis not present

## 2024-03-18 DIAGNOSIS — R351 Nocturia: Secondary | ICD-10-CM | POA: Diagnosis not present

## 2024-03-18 DIAGNOSIS — I479 Paroxysmal tachycardia, unspecified: Secondary | ICD-10-CM

## 2024-03-18 DIAGNOSIS — J4550 Severe persistent asthma, uncomplicated: Secondary | ICD-10-CM | POA: Diagnosis not present

## 2024-03-18 DIAGNOSIS — Z86718 Personal history of other venous thrombosis and embolism: Secondary | ICD-10-CM

## 2024-03-18 DIAGNOSIS — R739 Hyperglycemia, unspecified: Secondary | ICD-10-CM | POA: Diagnosis not present

## 2024-03-18 DIAGNOSIS — I10 Essential (primary) hypertension: Secondary | ICD-10-CM | POA: Diagnosis not present

## 2024-03-18 DIAGNOSIS — N401 Enlarged prostate with lower urinary tract symptoms: Secondary | ICD-10-CM

## 2024-03-18 DIAGNOSIS — I5032 Chronic diastolic (congestive) heart failure: Secondary | ICD-10-CM | POA: Diagnosis not present

## 2024-03-18 MED ORDER — TRAZODONE HCL 50 MG PO TABS: 25.0000 mg | ORAL_TABLET | Freq: Every evening | ORAL | 3 refills | Status: AC | PRN

## 2024-03-18 MED ORDER — FINASTERIDE 5 MG PO TABS: 5.0000 mg | ORAL_TABLET | Freq: Every day | ORAL | 3 refills | Status: AC

## 2024-03-18 MED ORDER — DILTIAZEM HCL ER COATED BEADS 180 MG PO CP24: 180.0000 mg | ORAL_CAPSULE | Freq: Every day | ORAL | 3 refills | Status: AC

## 2024-03-18 MED ORDER — OMEPRAZOLE 20 MG PO CPDR: 20.0000 mg | DELAYED_RELEASE_CAPSULE | Freq: Every day | ORAL | 3 refills | Status: AC

## 2024-03-18 NOTE — Assessment & Plan Note (Signed)
 F/b pulm Continue current meds Chronic and stable

## 2024-03-18 NOTE — Progress Notes (Signed)
 Complete physical exam   Patient: [name]   DOB: 23-May-1941   83 y.o. Male  MRN: [id number] Visit Date: 03/18/2024  Today's healthcare provider: Jon Eva, MD   Chief Complaint  Patient presents with   Annual Exam    Last completed 03/18/23, AWV completed 03/02/24 Diet -  Low sodium Exercise - gym 3 mornings a week for 50 minutes Feeling - well Sleeping - well Concerns -  none   Subjective    [name] is a 82 y.o. male who presents today for a complete physical exam.    Discussed the use of AI scribe software for clinical note transcription with the patient, who gave verbal consent to proceed.  History of Present Illness   [name] is an 83 year old male who presents for an annual physical exam.  He has no specific concerns or symptoms and reports feeling well. His hearing is good.  He is unsure if he has received the RSV vaccine and plans to verify with the pharmacy. He intends to receive a COVID vaccine at the pharmacy and confirms no prescription is needed. He received his flu shot earlier today and is current with shingles and pneumonia vaccinations. He does not need a tetanus booster until 2032.  His medications include trazodone , Spiriva , Xarelto , omeprazole , and finasteride . He takes trazodone  as a half pill and omeprazole  daily to prevent esophageal issues, noting that without it, his esophagus occasionally obstructs. He reports no issues with his current medications.  He visited the ER in June for a cut on his arm, during which labs were conducted. His cholesterol was last checked a year ago, with a total cholesterol of 142 and LDL of 77. He regularly checks his blood pressure at home and is monitored by cardiology between visits.       Last depression screening scores    03/02/2024    9:28 AM 02/25/2023   11:30 AM 03/06/2022    2:03 PM  PHQ 2/9 Scores  PHQ - 2 Score 0 0 0  PHQ- 9 Score   0   Last fall risk screening     02/29/2024    7:26 PM  Fall Risk   Falls in the past year? 0  Number falls in past yr: 0  Injury with Fall? 0  Risk for fall due to : No Fall Risks  Follow up Falls prevention discussed        Medications: Outpatient Medications Prior to Visit  Medication Sig   albuterol  (PROVENTIL  HFA;VENTOLIN  HFA) 108 (90 Base) MCG/ACT inhaler Inhale 2 puffs into the lungs every 6 (six) hours as needed for wheezing or shortness of breath.   albuterol  (PROVENTIL ) (2.5 MG/3ML) 0.083% nebulizer solution Take 2.5 mg by nebulization in the morning and at bedtime.   brimonidine  (ALPHAGAN ) 0.15 % ophthalmic solution Place 1 drop into both eyes 2 (two) times daily.   clobetasol  ointment (TEMOVATE ) 0.05 % Apply to affected arm twice a day x 7 days, Avoid applying to face, groin, and axilla. Use as directed. Long-term use can cause thinning of the skin.   Desoximetasone  (TOPICORT ) 0.25 % ointment APPLY TO ECZEMA OF UPPER ARM TWICE DAILYAS DIRECTED.   dorzolamide  (TRUSOPT ) 2 % ophthalmic solution Place 1 drop into both eyes 2 (two) times daily.    fluorouracil  (EFUDEX ) 5 % cream Apply topically 2 (two) times daily. Bid to aa left dorsum hand until area gets red and irritated   ketoconazole  (NIZORAL )  2 % shampoo    latanoprost  (XALATAN ) 0.005 % ophthalmic solution Place 1 drop into both eyes at bedtime.    lidocaine  (LIDODERM ) 5 % Place 1 patch onto the skin every 12 (twelve) hours. Remove & Discard patch within 12 hours or as directed by MD   montelukast  (SINGULAIR ) 10 MG tablet Take 10 mg by mouth.   rivaroxaban  (XARELTO ) 10 MG TABS tablet Take 1 tablet (10 mg total) by mouth daily.   Sodium Chloride , Inhalant, 7 % NEBU Take 1 vial by nebulization 2 (two) times daily.   tiotropium (SPIRIVA ) 18 MCG inhalation capsule Place 1 capsule (18 mcg total) into inhaler and inhale every evening.   WIXELA INHUB 500-50 MCG/ACT AEPB    [DISCONTINUED] CARTIA  XT 180 MG 24 hr capsule TAKE 1 CAPSULE BY MOUTH ONCE DAILY    [DISCONTINUED] finasteride  (PROSCAR ) 5 MG tablet Take 1 tablet (5 mg total) by mouth daily.   [DISCONTINUED] omeprazole  (PRILOSEC) 20 MG capsule Take 1 capsule (20 mg total) by mouth daily.   [DISCONTINUED] traZODone  (DESYREL ) 50 MG tablet Take 0.5 tablets (25 mg total) by mouth at bedtime as needed for sleep.   No facility-administered medications prior to visit.    Review of Systems    Objective    BP 127/75 (BP Location: Left Arm, Patient Position: Sitting, Cuff Size: Normal)   Pulse 75   Ht 6' (1.829 m)   Wt 157 lb 3.2 oz (71.3 kg)   SpO2 98%   BMI 21.32 kg/m    Physical Exam Vitals reviewed.  Constitutional:      General: He is not in acute distress.    Appearance: Normal appearance. He is well-developed. He is not diaphoretic.  HENT:     Head: Normocephalic and atraumatic.     Right Ear: Tympanic membrane, ear canal and external ear normal.     Left Ear: Tympanic membrane, ear canal and external ear normal.     Nose: Nose normal.     Mouth/Throat:     Mouth: Mucous membranes are moist.     Pharynx: Oropharynx is clear. No oropharyngeal exudate.  Eyes:     General: No scleral icterus.    Conjunctiva/sclera: Conjunctivae normal.     Pupils: Pupils are equal, round, and reactive to light.  Neck:     Thyroid : No thyromegaly.  Cardiovascular:     Rate and Rhythm: Normal rate and regular rhythm.     Heart sounds: Normal heart sounds. No murmur heard. Pulmonary:     Effort: Pulmonary effort is normal. No respiratory distress.     Breath sounds: Normal breath sounds. No wheezing or rales.  Abdominal:     General: There is no distension.     Palpations: Abdomen is soft.     Tenderness: There is no abdominal tenderness.  Musculoskeletal:        General: No deformity.     Cervical back: Neck supple.     Right lower leg: No edema.     Left lower leg: No edema.  Lymphadenopathy:     Cervical: No cervical adenopathy.  Skin:    General: Skin is warm and dry.      Findings: No rash.  Neurological:     Mental Status: He is alert and oriented to person, place, and time. Mental status is at baseline.     Gait: Gait normal.  Psychiatric:        Mood and Affect: Mood normal.        Behavior:  Behavior normal.        Thought Content: Thought content normal.      No results found for any visits on 03/18/24.  Assessment & Plan    Routine Health Maintenance and Physical Exam  Exercise Activities and Dietary recommendations  Goals      DIET - EAT MORE FRUITS AND VEGETABLES     Some improvement     Patient Stated     Maintain health and activity         Immunization History  Administered Date(s) Administered   Fluad Quad(high Dose 65+) 02/08/2019, 03/02/2020, 03/12/2022   INFLUENZA, HIGH DOSE SEASONAL PF 05/04/2015, 05/04/2015, 03/21/2016, 03/21/2016, 03/24/2017, 03/24/2017, 03/18/2024   Influenza Split 03/02/2012, 03/17/2013, 05/07/2014, 02/23/2018   Influenza Whole 03/24/2007, 03/15/2009, 02/15/2010   Influenza,inj,Quad PF,6+ Mos 03/14/2021   Influenza-Unspecified 02/23/2018, 03/01/2023   PFIZER(Purple Top)SARS-COV-2 Vaccination 06/28/2019, 07/19/2019, 03/20/2020, 03/01/2023   Pfizer Covid-19 Vaccine Bivalent Booster 16yrs & up 03/16/2021   Pneumococcal Conjugate-13 11/17/2013   Pneumococcal Polysaccharide-23 11/19/2004, 03/19/2007, 03/17/2009, 02/09/2020   Td 05/16/1997, 05/14/2007, 09/11/2010   Td (Adult), 2 Lf Tetanus Toxid, Preservative Free 05/16/1997, 05/14/2007, 09/11/2010   Tdap 03/14/2021   Zoster Recombinant(Shingrix) 02/23/2018, 04/28/2018   Zoster, Live 01/02/2011    Health Maintenance  Topic Date Due   COVID-19 Vaccine (6 - 2025-26 season) 02/16/2024   Medicare Annual Wellness (AWV)  03/02/2025   DTaP/Tdap/Td (7 - Td or Tdap) 03/15/2031   Pneumococcal Vaccine: 50+ Years  Completed   Influenza Vaccine  Completed   Zoster Vaccines- Shingrix  Completed   HPV VACCINES  Aged Out   Meningococcal B Vaccine  Aged Out     Discussed health benefits of physical activity, and encouraged him to engage in regular exercise appropriate for his age and condition.  Problem List Items Addressed This Visit       Cardiovascular and Mediastinum   Essential hypertension (Chronic)   Well controlled Continue current medications Recheck metabolic panel      Relevant Medications   diltiazem  (CARTIA  XT) 180 MG 24 hr capsule   Other Relevant Orders   Comprehensive metabolic panel with GFR   Lipid panel   Paroxysmal tachycardia (HCC)   Relevant Medications   diltiazem  (CARTIA  XT) 180 MG 24 hr capsule   Other Relevant Orders   Comprehensive metabolic panel with GFR   Lipid panel   CHF (congestive heart failure) (HCC)   Chronic  Euvolemic, stable Followed by cardiology       Relevant Medications   diltiazem  (CARTIA  XT) 180 MG 24 hr capsule     Respiratory   Severe persistent asthma without complication (HCC)   F/b pulm Continue current meds Chronic and stable        Endocrine   Hyperglycemia   Relevant Orders   Hemoglobin A1c     Other   History of DVT (deep vein thrombosis)   Left proximal and distal DVT in 2016, unprovoked. On long-term anticoagulation with Xarelto , well-tolerated. Previously switched to Eliquis  temporarily but prefers Xarelto . - Refill Xarelto  prescription for one year - recent CBC without anemia      BPH associated with nocturia   Benign prostatic hyperplasia managed with finasteride . No new symptoms reported. - Send refill for finasteride  to pharmacy.      Relevant Medications   finasteride  (PROSCAR ) 5 MG tablet   Insomnia, unspecified   Other Visit Diagnoses       Encounter for annual physical exam    -  Primary   Relevant  Orders   Comprehensive metabolic panel with GFR   Lipid panel   Hemoglobin A1c     Immunization due       Relevant Orders   Flu vaccine HIGH DOSE PF(Fluzone Trivalent) (Completed)           Adult Wellness Visit Routine adult  wellness visit with no acute concerns. Blood pressure is well-controlled. Recent labs from June were satisfactory, including cholesterol levels from last year. Cardiovascular health is monitored by cardiology. - Order lab tests for cholesterol, kidney and liver function, and blood glucose. - Schedule next annual physical exam.  Gastroesophageal reflux disease Chronic gastroesophageal reflux disease managed with omeprazole  (Prilosec). Continues to experience esophageal symptoms if medication is not taken. Long-term use of omeprazole  can affect calcium  absorption, but benefits outweigh risks due to symptom control. - Continue omeprazole  (Prilosec). - Send refill for omeprazole  to pharmacy.  Insomnia Insomnia managed with trazodone . No issues reported with current regimen. - Send refill for trazodone  to pharmacy.        Return in about 1 year (around 03/18/2025) for CPE.     Jon Eva, MD  [facility] [phone] (phone) [phone] (fax)  [facility]

## 2024-03-18 NOTE — Assessment & Plan Note (Signed)
 Left proximal and distal DVT in 2016, unprovoked. On long-term anticoagulation with Xarelto , well-tolerated. Previously switched to Eliquis  temporarily but prefers Xarelto . - Refill Xarelto  prescription for one year - recent CBC without anemia

## 2024-03-18 NOTE — Assessment & Plan Note (Signed)
 Chronic  Euvolemic, stable Followed by cardiology

## 2024-03-18 NOTE — Assessment & Plan Note (Signed)
 Benign prostatic hyperplasia managed with finasteride . No new symptoms reported. - Send refill for finasteride  to pharmacy.

## 2024-03-18 NOTE — Assessment & Plan Note (Signed)
 Well controlled Continue current medications Recheck metabolic panel

## 2024-03-19 LAB — LIPID PANEL
Chol/HDL Ratio: 2.4 ratio (ref 0.0–5.0)
Cholesterol, Total: 150 mg/dL (ref 100–199)
HDL: 62 mg/dL (ref >39)
LDL Chol Calc (NIH): 78 mg/dL (ref 0–99)
Triglycerides: 43 mg/dL (ref 0–149)
VLDL Cholesterol Cal: 10 mg/dL (ref 5–40)

## 2024-03-19 LAB — COMPREHENSIVE METABOLIC PANEL WITH GFR
ALT: 25 IU/L (ref 0–44)
AST: 24 IU/L (ref 0–40)
Albumin: 3.8 g/dL (ref 3.7–4.7)
Alkaline Phosphatase: 128 IU/L (ref 48–129)
BUN/Creatinine Ratio: 11 (ref 10–24)
BUN: 11 mg/dL (ref 8–27)
Bilirubin Total: 0.5 mg/dL (ref 0.0–1.2)
CO2: 23 mmol/L (ref 20–29)
Calcium: 9.2 mg/dL (ref 8.6–10.2)
Chloride: 97 mmol/L (ref 96–106)
Creatinine, Ser: 0.97 mg/dL (ref 0.76–1.27)
Globulin, Total: 2.6 g/dL (ref 1.5–4.5)
Glucose: 81 mg/dL (ref 70–99)
Potassium: 4.5 mmol/L (ref 3.5–5.2)
Sodium: 134 mmol/L (ref 134–144)
Total Protein: 6.4 g/dL (ref 6.0–8.5)
eGFR: 77 mL/min/1.73 (ref >59)

## 2024-03-19 LAB — HEMOGLOBIN A1C
Est. average glucose Bld gHb Est-mCnc: 111 mg/dL
Hgb A1c MFr Bld: 5.5 % (ref 4.8–5.6)

## 2024-03-21 DIAGNOSIS — K121 Other forms of stomatitis: Secondary | ICD-10-CM | POA: Diagnosis not present

## 2024-03-22 ENCOUNTER — Ambulatory Visit: Payer: Self-pay | Admitting: Family Medicine

## 2024-03-22 DIAGNOSIS — J479 Bronchiectasis, uncomplicated: Secondary | ICD-10-CM | POA: Diagnosis not present

## 2024-03-22 DIAGNOSIS — J4489 Other specified chronic obstructive pulmonary disease: Secondary | ICD-10-CM | POA: Diagnosis not present

## 2024-03-23 DIAGNOSIS — Z961 Presence of intraocular lens: Secondary | ICD-10-CM | POA: Diagnosis not present

## 2024-03-23 DIAGNOSIS — H353131 Nonexudative age-related macular degeneration, bilateral, early dry stage: Secondary | ICD-10-CM | POA: Diagnosis not present

## 2024-03-23 DIAGNOSIS — H401113 Primary open-angle glaucoma, right eye, severe stage: Secondary | ICD-10-CM | POA: Diagnosis not present

## 2024-03-23 DIAGNOSIS — H401122 Primary open-angle glaucoma, left eye, moderate stage: Secondary | ICD-10-CM | POA: Diagnosis not present

## 2024-03-26 DIAGNOSIS — J479 Bronchiectasis, uncomplicated: Secondary | ICD-10-CM | POA: Diagnosis not present

## 2024-04-16 ENCOUNTER — Ambulatory Visit: Admitting: Urology

## 2024-04-16 VITALS — BP 134/76 | HR 72 | Wt 160.0 lb

## 2024-04-16 DIAGNOSIS — R350 Frequency of micturition: Secondary | ICD-10-CM | POA: Diagnosis not present

## 2024-04-16 NOTE — Progress Notes (Signed)
 PTNS  Session # maintenance   Health & Social Factors: no Caffeine: 2 cups per day Alcohol: 0 Daytime voids #per day: 5x per day  Night-time voids #per night: 3x per night  Urgency: mild Incontinence Episodes #per day: 0 Ankle used: right  Treatment Setting: 7 Feeling/ Response: sensory Comments: patient tolerated treatment   Performed By: Andrea Kirks LPN

## 2024-04-28 DIAGNOSIS — J4489 Other specified chronic obstructive pulmonary disease: Secondary | ICD-10-CM | POA: Diagnosis not present

## 2024-04-30 ENCOUNTER — Ambulatory Visit: Admitting: Urology

## 2024-05-06 ENCOUNTER — Ambulatory Visit: Admitting: Urology

## 2024-05-17 ENCOUNTER — Ambulatory Visit: Admitting: Urology

## 2024-05-17 DIAGNOSIS — R350 Frequency of micturition: Secondary | ICD-10-CM

## 2024-05-17 NOTE — Progress Notes (Signed)
 PTNS  Session # monthly treatment  Health & Social Factors: no change Caffeine: 2  Alcohol: 0 Daytime voids #per day: 5 Night-time voids #per night: 3 Urgency: none Incontinence Episodes #per day: 0 Ankle used: right Treatment Setting: 12 Feeling/ Response: sensory Comments: n'a  Performed By: Mathew Pinal, RN  Follow Up: 1 month

## 2024-05-19 DIAGNOSIS — J479 Bronchiectasis, uncomplicated: Secondary | ICD-10-CM | POA: Diagnosis not present

## 2024-05-29 DIAGNOSIS — J479 Bronchiectasis, uncomplicated: Secondary | ICD-10-CM | POA: Diagnosis not present

## 2024-06-21 ENCOUNTER — Ambulatory Visit: Admitting: Urology

## 2024-06-21 VITALS — BP 138/71 | HR 73 | Wt 160.0 lb

## 2024-06-21 DIAGNOSIS — R350 Frequency of micturition: Secondary | ICD-10-CM

## 2024-06-21 NOTE — Progress Notes (Signed)
 PTNS  Session # 28/45  Health & Social Factors: None Caffeine: 2 cups Alcohol: 0 Daytime voids #per day: 5 Night-time voids #per night: 3 Urgency: mild Incontinence Episodes #per day: 0 Ankle used: Left Treatment Setting: 9 Feeling/ Response: Sensory  Comments: Patient tolerated  Performed By: CLOTILDA CORNWALL, PA-C   Follow Up: One month for # 29/45 PTNS

## 2024-07-08 NOTE — Telephone Encounter (Signed)
 Requested Prescriptions   Pending Prescriptions Disp Refills   sodium chloride  7% 7 % Nebu 720 mL 3    Sig: Inhale 4 mL by nebulization two (2) times a day.    Patient needing [facility]

## 2024-07-22 ENCOUNTER — Ambulatory Visit: Admitting: Urology

## 2024-07-22 NOTE — Progress Notes (Unsigned)
 PTNS  Session # 29/45  Health & Social Factors: No change  Caffeine: 2 Alcohol: 0 Daytime voids #per day: 5 Night-time voids #per night: 3 Urgency: Strong  Incontinence Episodes #per day: 0  Ankle used: Right  Treatment Setting: 4 Feeling/ Response: Sensory Comments: Patient tolerated  Performed By: CLOTILDA CORNWALL, PA-C   Follow Up: 1 month for #30/45 PTNS

## 2024-07-23 ENCOUNTER — Ambulatory Visit: Admitting: Urology

## 2024-07-23 VITALS — BP 135/98 | HR 107 | Wt 160.0 lb

## 2024-07-23 DIAGNOSIS — R350 Frequency of micturition: Secondary | ICD-10-CM

## 2024-08-04 ENCOUNTER — Ambulatory Visit: Admitting: Podiatry

## 2024-08-23 ENCOUNTER — Ambulatory Visit: Admitting: Urology

## 2024-08-26 ENCOUNTER — Ambulatory Visit: Admitting: Dermatology

## 2025-03-21 ENCOUNTER — Encounter: Admitting: Family Medicine

## 2025-06-21 ENCOUNTER — Ambulatory Visit: Admitting: Urology
